# Patient Record
Sex: Male | Born: 1945 | Race: White | Hispanic: No | Marital: Married | Smoking: Former smoker
Health system: Southern US, Community
[De-identification: ages and names within clinical notes are randomized; demographics above are authoritative.]

## PROBLEM LIST (undated history)

## (undated) DIAGNOSIS — C7800 Secondary malignant neoplasm of unspecified lung: Secondary | ICD-10-CM

## (undated) DIAGNOSIS — I10 Essential (primary) hypertension: Secondary | ICD-10-CM

## (undated) DIAGNOSIS — F17200 Nicotine dependence, unspecified, uncomplicated: Secondary | ICD-10-CM

## (undated) DIAGNOSIS — G629 Polyneuropathy, unspecified: Secondary | ICD-10-CM

## (undated) DIAGNOSIS — C20 Malignant neoplasm of rectum: Secondary | ICD-10-CM

## (undated) DIAGNOSIS — I8290 Acute embolism and thrombosis of unspecified vein: Secondary | ICD-10-CM

## (undated) DIAGNOSIS — Z8619 Personal history of other infectious and parasitic diseases: Secondary | ICD-10-CM

## (undated) DIAGNOSIS — Z862 Personal history of diseases of the blood and blood-forming organs and certain disorders involving the immune mechanism: Secondary | ICD-10-CM

## (undated) HISTORY — DX: Essential (primary) hypertension: I10

## (undated) HISTORY — DX: Nicotine dependence, unspecified, uncomplicated: F17.200

## (undated) HISTORY — PX: LUNG BIOPSY: SHX232

## (undated) HISTORY — PX: TONSILLECTOMY AND ADENOIDECTOMY: SUR1326

## (undated) HISTORY — PX: LUNG LOBECTOMY: SHX167

## (undated) HISTORY — DX: Personal history of diseases of the blood and blood-forming organs and certain disorders involving the immune mechanism: Z86.2

## (undated) HISTORY — DX: Personal history of other infectious and parasitic diseases: Z86.19

---

## 2008-11-14 HISTORY — PX: VARICOSE VEIN SURGERY: SHX832

## 2010-10-22 LAB — CBC
WBC: 10.4
platelet count: 213

## 2010-10-22 LAB — COMPREHENSIVE METABOLIC PANEL
ALT: 13 U/L (ref 10–40)
AST: 17 U/L
Albumin: 4
Alkaline Phosphatase: 64 U/L
BUN: 17 mg/dL (ref 4–21)
Glucose: 95
Potassium: 4.4 mmol/L
Total Bilirubin: 0.5 mg/dL

## 2010-10-22 LAB — LIPID PANEL: Cholesterol, Total: 216

## 2012-03-21 ENCOUNTER — Ambulatory Visit (INDEPENDENT_AMBULATORY_CARE_PROVIDER_SITE_OTHER): Payer: Managed Care, Other (non HMO) | Admitting: Family Medicine

## 2012-03-21 ENCOUNTER — Encounter: Payer: Self-pay | Admitting: Family Medicine

## 2012-03-21 VITALS — BP 128/82 | HR 66 | Temp 97.3°F | Ht 67.0 in | Wt 201.8 lb

## 2012-03-21 DIAGNOSIS — Z862 Personal history of diseases of the blood and blood-forming organs and certain disorders involving the immune mechanism: Secondary | ICD-10-CM

## 2012-03-21 DIAGNOSIS — F17200 Nicotine dependence, unspecified, uncomplicated: Secondary | ICD-10-CM

## 2012-03-21 DIAGNOSIS — Z23 Encounter for immunization: Secondary | ICD-10-CM

## 2012-03-21 DIAGNOSIS — I10 Essential (primary) hypertension: Secondary | ICD-10-CM

## 2012-03-21 NOTE — Patient Instructions (Addendum)
We will request records from Dr. Quillian Quince. Bring me a copy of blood work at next visit. Come in at your convenience for complete physical, come in sooner if needed. Good to meet you today, call us with questions. Pneumonia shot today.

## 2012-03-21 NOTE — Assessment & Plan Note (Signed)
Stable on benicar.  Some reaction to prior med, thinks HCTZ - sounds like tachycardia that led him to ER? Await records from prior PCP.  No changes today. Desires to stay on benicar.

## 2012-03-21 NOTE — Progress Notes (Signed)
Subjective:    Patient ID: Stephen [name], male    DOB: 1946/01/07, 66 y.o.   MRN: [id number]  HPI CC: new pt establish  Would like to establish closer to home.  Prior saw Dr. Quillian Quince in [facility]  Hand pain - longstanding for last year, worse recently.  Worse pain with grasping hand, ie holding cup.    Smoking - 3/4 ppd.  Contemplative.  HTN - compliant with benicar and tolerating well.  No HA, vision changes, CP/tightness, SOB, leg swelling.  Prior on another medicine that led him to hospitalization from tachycardia.  Thinks this was hydrochlorothiazide.  Preventative: 11/2011 CPE, blood work then as well. Tetanus shot - 35 yrs ago.  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  Past histories reviewed and updated if relevant as below. There is no problem list on file for this patient.  Past Medical History  Diagnosis Date  . History of anemia   . History of chicken pox   . HTN (hypertension)    Past Surgical History  Procedure Date  . Varicose vein surgery 2010  . Tonsillectomy and adenoidectomy Childhood   History  Substance Use Topics  . Smoking status: Current Everyday Smoker -- 0.8 packs/day    Types: Cigarettes  . Smokeless tobacco: Never Used  . Alcohol Use: Yes     Regular 2 scotch/day   Family History  Problem Relation Age of Onset  . Hypertension Mother   . Coronary artery disease Mother     angina  . Cancer Mother     breast  . Cancer Father 23    esophageal  . COPD Brother     emphysema   No Known Allergies Current Outpatient Prescriptions on File Prior to Visit  Medication Sig Dispense Refill  . loratadine (CLARITIN) 10 MG tablet Take 10 mg by mouth daily as needed.       Marland Kitchen olmesartan (BENICAR) 20 MG tablet Take 20 mg by mouth daily.         Review of Systems  Constitutional: Negative for fever, chills, activity change, appetite change, fatigue and unexpected weight change.  HENT: Negative for hearing loss and neck pain.   Eyes: Negative for visual disturbance.  Respiratory: Positive for cough and wheezing. Negative for chest tightness and shortness of breath.   Cardiovascular: Negative for chest pain, palpitations and leg swelling.  Gastrointestinal: Negative for nausea, vomiting, abdominal pain, diarrhea, constipation, blood in stool and abdominal distention.  Genitourinary: Negative for hematuria and difficulty urinating.  Musculoskeletal: Negative for myalgias and arthralgias.  Skin: Negative for rash.  Neurological: Negative for dizziness, seizures, syncope and headaches.  Hematological: Does not bruise/bleed easily.  Psychiatric/Behavioral: Negative for dysphoric mood. The patient is not nervous/anxious.        Objective:   Physical Exam  Nursing note and vitals reviewed. Constitutional: He is oriented to person, place, and time. He appears well-developed and well-nourished. No distress.  HENT:  Head: Normocephalic and atraumatic.  Right Ear: External ear normal.  Left Ear: External ear normal.  Nose: Nose normal.  Mouth/Throat: Oropharynx is clear and moist. No oropharyngeal exudate.  Eyes: Conjunctivae and EOM are normal. Pupils are equal, round, and reactive to light. No scleral icterus.  Neck: Normal range of motion. Neck supple.  Cardiovascular: Normal rate, regular rhythm, normal heart sounds and intact distal pulses.   No murmur heard. Pulses:      Radial pulses are 2+ on the right side, and 2+ on the left side.  Pulmonary/Chest: Effort normal and breath sounds normal. No respiratory distress. He has no wheezes. He has no rales.  Abdominal: Soft. Bowel sounds are normal. He exhibits no distension and no mass. There is no  tenderness. There is no rebound and no guarding.  Musculoskeletal: Normal range of motion. He exhibits no edema.  Lymphadenopathy:    He has no cervical adenopathy.  Neurological: He is alert and oriented to person, place, and time.       CN grossly intact, station and gait intact  Skin: Skin is warm and dry. No rash noted.  Psychiatric: He has a normal mood and affect. His behavior is normal. Judgment and thought content normal.      Assessment & Plan:

## 2012-03-21 NOTE — Assessment & Plan Note (Signed)
Encouraged cessation. May need eval for COPD in future if any dyspnea or wheezing. currently endorses some but attributes to seasonal allergies.

## 2012-04-09 ENCOUNTER — Encounter: Payer: Self-pay | Admitting: Family Medicine

## 2012-04-17 ENCOUNTER — Encounter: Payer: Self-pay | Admitting: Family Medicine

## 2013-09-14 DIAGNOSIS — C7800 Secondary malignant neoplasm of unspecified lung: Secondary | ICD-10-CM

## 2013-09-14 DIAGNOSIS — C20 Malignant neoplasm of rectum: Secondary | ICD-10-CM

## 2013-09-14 HISTORY — DX: Secondary malignant neoplasm of unspecified lung: C78.00

## 2013-09-14 HISTORY — DX: Malignant neoplasm of rectum: C20

## 2013-10-14 ENCOUNTER — Ambulatory Visit: Payer: Self-pay | Admitting: Oncology

## 2013-10-25 ENCOUNTER — Ambulatory Visit: Payer: Self-pay | Admitting: Oncology

## 2013-10-31 ENCOUNTER — Ambulatory Visit: Payer: Self-pay | Admitting: Oncology

## 2013-11-11 ENCOUNTER — Other Ambulatory Visit: Payer: Self-pay

## 2013-11-11 DIAGNOSIS — C20 Malignant neoplasm of rectum: Secondary | ICD-10-CM

## 2013-11-11 NOTE — Progress Notes (Signed)
EUS scheduled, pt instructed and medications reviewed.  Patient instructions mailed to home.  Patient to call with any questions or concerns.  

## 2013-11-14 ENCOUNTER — Ambulatory Visit: Payer: Self-pay | Admitting: Oncology

## 2013-11-14 DIAGNOSIS — I8290 Acute embolism and thrombosis of unspecified vein: Secondary | ICD-10-CM

## 2013-11-14 HISTORY — PX: COLON RESECTION: SHX5231

## 2013-11-14 HISTORY — DX: Acute embolism and thrombosis of unspecified vein: I82.90

## 2013-11-14 HISTORY — PX: OTHER SURGICAL HISTORY: SHX169

## 2013-11-14 HISTORY — PX: COLONOSCOPY: SHX174

## 2013-11-15 ENCOUNTER — Encounter (HOSPITAL_COMMUNITY): Payer: Self-pay | Admitting: *Deleted

## 2013-11-15 ENCOUNTER — Encounter (HOSPITAL_COMMUNITY)
Admission: RE | Disposition: A | Discharge status: Home / Self Care | Payer: Self-pay | Source: Ambulatory Visit | Attending: Gastroenterology

## 2013-11-15 ENCOUNTER — Ambulatory Visit (HOSPITAL_COMMUNITY)
Admission: RE | Admit: 2013-11-15 | Discharge: 2013-11-15 | Disposition: A | Discharge status: Home / Self Care | Payer: Medicare PPO | Source: Ambulatory Visit | Attending: Gastroenterology | Admitting: Gastroenterology

## 2013-11-15 DIAGNOSIS — Z800 Family history of malignant neoplasm of digestive organs: Secondary | ICD-10-CM | POA: Insufficient documentation

## 2013-11-15 DIAGNOSIS — I10 Essential (primary) hypertension: Secondary | ICD-10-CM | POA: Insufficient documentation

## 2013-11-15 DIAGNOSIS — C20 Malignant neoplasm of rectum: Secondary | ICD-10-CM

## 2013-11-15 DIAGNOSIS — F17200 Nicotine dependence, unspecified, uncomplicated: Secondary | ICD-10-CM | POA: Insufficient documentation

## 2013-11-15 HISTORY — PX: EUS: SHX5427

## 2013-11-15 SURGERY — ULTRASOUND, LOWER GI TRACT, ENDOSCOPIC
Anesthesia: Moderate Sedation

## 2013-11-15 MED ORDER — MIDAZOLAM HCL 10 MG/2ML IJ SOLN
INTRAMUSCULAR | Status: DC | PRN
  Administered 2013-11-15: 2 mg via INTRAVENOUS

## 2013-11-15 MED ORDER — FENTANYL CITRATE 0.05 MG/ML IJ SOLN
INTRAMUSCULAR | Status: AC
  Filled 2013-11-15: qty 2

## 2013-11-15 MED ORDER — FENTANYL CITRATE 0.05 MG/ML IJ SOLN
INTRAMUSCULAR | Status: DC | PRN
  Administered 2013-11-15: 25 ug via INTRAVENOUS

## 2013-11-15 MED ORDER — SODIUM CHLORIDE 0.9 % IV SOLN: INTRAVENOUS | Status: DC

## 2013-11-15 MED ORDER — MIDAZOLAM HCL 10 MG/2ML IJ SOLN
INTRAMUSCULAR | Status: AC
  Filled 2013-11-15: qty 2

## 2013-11-15 MED ORDER — DIPHENHYDRAMINE HCL 50 MG/ML IJ SOLN
INTRAMUSCULAR | Status: AC
  Filled 2013-11-15: qty 1

## 2013-11-15 NOTE — Op Note (Signed)
[facility] [place], [id number]   ENDOSCOPIC ULTRASOUND PROCEDURE REPORT  PATIENT: Stephen [name], Stephen [name]  MR#: [id number] BIRTHDATE: [birth year]  GENDER: Male ENDOSCOPIST: Milus Banister, MD REFERRED BY:  Dr. Grayland Ormond [facility]), Dr. 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  Claudie Leach in [place]. MEDICATIONS: Fentanyl 25 mcg IV and Versed 2 mg IV  DESCRIPTION OF PROCEDURE:   After the risks benefits and alternatives of the procedure were  explained, informed consent was obtained. The patient was then placed in the left, lateral, decubitus postion and IV sedation was administered. Throughout the procedure, the patients blood pressure, pulse and oxygen saturations were monitored continuously.  Under direct visualization, the Pentax Radial EUS [id number]  endoscope was introduced through the anus  and advanced to the sigmoid colon . Water was used as necessary to provide an acoustic interface.  Upon completion of the imaging, water was removed and the patient was sent to the recovery room in satisfactory condition.   Sigmoidoscopic findings: 1. Clearly malignant, non-circumferential mass in distal rectum. The mass measures about 4cm across, occupies 1/3 the circumference rectum, predominantly on posterior wall of the rectum and its distal edge is within 1cm of the anal verge.  EUS findings: 1. The mass above corresponds with a hypoechoic, hetergeneous lesion that passes into and through the muscularis propria layer of the distal rectal wall (uT3).  Measures 3.8cm across. 2. The left iliac lymphnode noted by PET/CT was visible on this exam as well and was 35mm across, discretely bordered, hypoechoic, homogeneous (suspicious for malignant involvement  (uN1).  Impression: uT3N1 non-circumferntial, 4cm, posterior wall,  rectal adenocarcinoma with distal edge within 1cm of the anal verge. He would likely benefit from neo-adjuvant chemo/XRT.   _______________________________ eSignedMilus Banister, MD 11/15/2013 12:22 PM   cc: Cathlean Cower, MD

## 2013-11-15 NOTE — Discharge Instructions (Signed)

## 2013-11-15 NOTE — H&P (Signed)
HPI: This is a very pleasant man with recently diagnosed rectal adenocarcinoma.  No clear metaastatic disease on PET/CT however suspicious L iliac node and CEA was 70.    Past Medical History  Diagnosis Date  . History of anemia   . History of chicken pox   . HTN (hypertension)   . Smoker   . Allergic rhinitis     Past Surgical History  Procedure Laterality Date  . Varicose vein surgery  2010  . Tonsillectomy and adenoidectomy  Childhood    Current Facility-Administered Medications  Medication Dose Route Frequency Provider Last Rate Last Dose  . 0.9 %  sodium chloride infusion   Intravenous Continuous Milus Banister, MD        Allergies as of 11/11/2013  . (No Known Allergies)    Family History  Problem Relation Age of Onset  . Hypertension Mother   . Coronary artery disease Mother     angina  . Cancer Mother     breast  . Cancer Father 109    esophageal  . COPD Brother     emphysema  . Cancer Paternal Grandmother     colon  . Diabetes Neg Hx     History   Social History  . Marital Status: Married    Spouse Name: N/A    Number of Children: N/A  . Years of Education: N/A   Occupational History  . Not on file.   Social History Main Topics  . Smoking status: Current Every Day Smoker -- 0.80 packs/day    Types: Cigarettes  . Smokeless tobacco: Never Used  . Alcohol Use: Yes     Comment: Regular 2 scotch/day  . Drug Use: No  . Sexual Activity: Not on file   Other Topics Concern  . 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 male with rectal cancer   For lower EUS today.

## 2013-11-18 ENCOUNTER — Encounter (HOSPITAL_COMMUNITY): Payer: Self-pay | Admitting: Gastroenterology

## 2013-11-25 ENCOUNTER — Ambulatory Visit: Payer: Self-pay | Admitting: Oncology

## 2013-11-27 ENCOUNTER — Ambulatory Visit: Payer: Self-pay | Admitting: Unknown Physician Specialty

## 2013-11-28 ENCOUNTER — Ambulatory Visit: Payer: Self-pay | Admitting: Surgery

## 2013-12-12 LAB — CBC CANCER CENTER
BASOS ABS: 0.1 x10 3/mm (ref 0.0–0.1)
Basophil %: 1.3 %
Eosinophil #: 0.3 x10 3/mm (ref 0.0–0.7)
Eosinophil %: 2.4 %
HCT: 44.5 % (ref 40.0–52.0)
HGB: 15.2 g/dL (ref 13.0–18.0)
LYMPHS ABS: 1.4 x10 3/mm (ref 1.0–3.6)
Lymphocyte %: 13.1 %
MCH: 37.9 pg — AB (ref 26.0–34.0)
MCHC: 34.2 g/dL (ref 32.0–36.0)
MCV: 111 fL — ABNORMAL HIGH (ref 80–100)
Monocyte #: 1 x10 3/mm (ref 0.2–1.0)
Monocyte %: 9.9 %
Neutrophil #: 7.7 x10 3/mm — ABNORMAL HIGH (ref 1.4–6.5)
Neutrophil %: 73.3 %
Platelet: 300 x10 3/mm (ref 150–440)
RBC: 4.01 10*6/uL — ABNORMAL LOW (ref 4.40–5.90)
RDW: 17.1 % — AB (ref 11.5–14.5)
WBC: 10.5 x10 3/mm (ref 3.8–10.6)

## 2013-12-12 LAB — COMPREHENSIVE METABOLIC PANEL
ALK PHOS: 91 U/L
Albumin: 3.5 g/dL (ref 3.4–5.0)
Anion Gap: 9 (ref 7–16)
BUN: 9 mg/dL (ref 7–18)
Bilirubin,Total: 0.7 mg/dL (ref 0.2–1.0)
CHLORIDE: 95 mmol/L — AB (ref 98–107)
Calcium, Total: 8.3 mg/dL — ABNORMAL LOW (ref 8.5–10.1)
Co2: 25 mmol/L (ref 21–32)
Creatinine: 1.18 mg/dL (ref 0.60–1.30)
EGFR (African American): >60
EGFR (Non-African Amer.): >60
Glucose: 95 mg/dL (ref 65–99)
Osmolality: 257 (ref 275–301)
POTASSIUM: 4.7 mmol/L (ref 3.5–5.1)
SGOT(AST): 20 U/L (ref 15–37)
SGPT (ALT): 21 U/L (ref 12–78)
Sodium: 129 mmol/L — ABNORMAL LOW (ref 136–145)
Total Protein: 7.4 g/dL (ref 6.4–8.2)

## 2013-12-13 LAB — CEA: CEA: 62.9 ng/mL — ABNORMAL HIGH (ref 0.0–4.7)

## 2013-12-15 ENCOUNTER — Ambulatory Visit: Payer: Self-pay | Admitting: Oncology

## 2013-12-20 LAB — CBC CANCER CENTER
Basophil #: 0 x10 3/mm (ref 0.0–0.1)
Basophil %: 0.9 %
EOS PCT: 4.8 %
Eosinophil #: 0.2 x10 3/mm (ref 0.0–0.7)
HCT: 42.2 % (ref 40.0–52.0)
HGB: 14.9 g/dL (ref 13.0–18.0)
Lymphocyte #: 0.9 x10 3/mm — ABNORMAL LOW (ref 1.0–3.6)
Lymphocyte %: 27.1 %
MCH: 40 pg — AB (ref 26.0–34.0)
MCHC: 35.2 g/dL (ref 32.0–36.0)
MCV: 114 fL — AB (ref 80–100)
Monocyte #: 0.3 x10 3/mm (ref 0.2–1.0)
Monocyte %: 10 %
NEUTROS ABS: 1.8 x10 3/mm (ref 1.4–6.5)
Neutrophil %: 57.2 %
PLATELETS: 170 x10 3/mm (ref 150–440)
RBC: 3.72 10*6/uL — ABNORMAL LOW (ref 4.40–5.90)
RDW: 17 % — AB (ref 11.5–14.5)
WBC: 3.2 x10 3/mm — ABNORMAL LOW (ref 3.8–10.6)

## 2013-12-26 LAB — CBC CANCER CENTER
BASOS ABS: 0 x10 3/mm (ref 0.0–0.1)
Basophil %: 0.5 %
EOS ABS: 0.4 x10 3/mm (ref 0.0–0.7)
Eosinophil %: 6.7 %
HCT: 42.2 % (ref 40.0–52.0)
HGB: 14.2 g/dL (ref 13.0–18.0)
LYMPHS ABS: 0.8 x10 3/mm — AB (ref 1.0–3.6)
Lymphocyte %: 14.1 %
MCH: 37.6 pg — ABNORMAL HIGH (ref 26.0–34.0)
MCHC: 33.6 g/dL (ref 32.0–36.0)
MCV: 112 fL — AB (ref 80–100)
MONO ABS: 0.9 x10 3/mm (ref 0.2–1.0)
MONOS PCT: 16.4 %
NEUTROS ABS: 3.5 x10 3/mm (ref 1.4–6.5)
Neutrophil %: 62.3 %
Platelet: 150 x10 3/mm (ref 150–440)
RBC: 3.77 10*6/uL — AB (ref 4.40–5.90)
RDW: 17.3 % — AB (ref 11.5–14.5)
WBC: 5.6 x10 3/mm (ref 3.8–10.6)

## 2013-12-26 LAB — BASIC METABOLIC PANEL
Anion Gap: 8 (ref 7–16)
BUN: 14 mg/dL (ref 7–18)
CHLORIDE: 101 mmol/L (ref 98–107)
Calcium, Total: 8 mg/dL — ABNORMAL LOW (ref 8.5–10.1)
Co2: 27 mmol/L (ref 21–32)
Creatinine: 1.29 mg/dL (ref 0.60–1.30)
GFR CALC NON AF AMER: 57 — AB
Glucose: 118 mg/dL — ABNORMAL HIGH (ref 65–99)
Osmolality: 274 (ref 275–301)
POTASSIUM: 4.2 mmol/L (ref 3.5–5.1)
Sodium: 136 mmol/L (ref 136–145)

## 2014-01-03 LAB — CBC CANCER CENTER
BASOS PCT: 1.2 %
Basophil #: 0.1 x10 3/mm (ref 0.0–0.1)
Eosinophil #: 0.3 x10 3/mm (ref 0.0–0.7)
Eosinophil %: 4 %
HCT: 42.6 % (ref 40.0–52.0)
HGB: 14.4 g/dL (ref 13.0–18.0)
LYMPHS ABS: 0.9 x10 3/mm — AB (ref 1.0–3.6)
Lymphocyte %: 11.6 %
MCH: 37.7 pg — ABNORMAL HIGH (ref 26.0–34.0)
MCHC: 33.7 g/dL (ref 32.0–36.0)
MCV: 112 fL — AB (ref 80–100)
MONOS PCT: 11.8 %
Monocyte #: 0.9 x10 3/mm (ref 0.2–1.0)
NEUTROS PCT: 71.4 %
Neutrophil #: 5.5 x10 3/mm (ref 1.4–6.5)
PLATELETS: 252 x10 3/mm (ref 150–440)
RBC: 3.81 10*6/uL — ABNORMAL LOW (ref 4.40–5.90)
RDW: 17.1 % — ABNORMAL HIGH (ref 11.5–14.5)
WBC: 7.6 x10 3/mm (ref 3.8–10.6)

## 2014-01-10 LAB — CBC CANCER CENTER
BASOS ABS: 0.1 x10 3/mm (ref 0.0–0.1)
Basophil %: 0.8 %
EOS PCT: 3 %
Eosinophil #: 0.2 x10 3/mm (ref 0.0–0.7)
HCT: 42.7 % (ref 40.0–52.0)
HGB: 14.3 g/dL (ref 13.0–18.0)
LYMPHS ABS: 0.8 x10 3/mm — AB (ref 1.0–3.6)
LYMPHS PCT: 10.7 %
MCH: 37.4 pg — ABNORMAL HIGH (ref 26.0–34.0)
MCHC: 33.5 g/dL (ref 32.0–36.0)
MCV: 112 fL — ABNORMAL HIGH (ref 80–100)
MONOS PCT: 12.3 %
Monocyte #: 0.9 x10 3/mm (ref 0.2–1.0)
NEUTROS ABS: 5.2 x10 3/mm (ref 1.4–6.5)
Neutrophil %: 73.2 %
PLATELETS: 185 x10 3/mm (ref 150–440)
RBC: 3.83 10*6/uL — ABNORMAL LOW (ref 4.40–5.90)
RDW: 16.8 % — AB (ref 11.5–14.5)
WBC: 7.1 x10 3/mm (ref 3.8–10.6)

## 2014-01-10 LAB — COMPREHENSIVE METABOLIC PANEL
ALBUMIN: 3.2 g/dL — AB (ref 3.4–5.0)
ALT: 21 U/L (ref 12–78)
Alkaline Phosphatase: 64 U/L
Anion Gap: 8 (ref 7–16)
BILIRUBIN TOTAL: 0.2 mg/dL (ref 0.2–1.0)
BUN: 10 mg/dL (ref 7–18)
CREATININE: 1.3 mg/dL (ref 0.60–1.30)
Calcium, Total: 8.3 mg/dL — ABNORMAL LOW (ref 8.5–10.1)
Chloride: 103 mmol/L (ref 98–107)
Co2: 28 mmol/L (ref 21–32)
GFR CALC NON AF AMER: 56 — AB
Glucose: 98 mg/dL (ref 65–99)
OSMOLALITY: 277 (ref 275–301)
POTASSIUM: 4.4 mmol/L (ref 3.5–5.1)
SGOT(AST): 16 U/L (ref 15–37)
Sodium: 139 mmol/L (ref 136–145)
Total Protein: 6.9 g/dL (ref 6.4–8.2)

## 2014-01-12 ENCOUNTER — Ambulatory Visit: Payer: Self-pay | Admitting: Oncology

## 2014-01-17 LAB — CBC CANCER CENTER
BASOS PCT: 0.9 %
Basophil #: 0 x10 3/mm (ref 0.0–0.1)
EOS PCT: 5.8 %
Eosinophil #: 0.3 x10 3/mm (ref 0.0–0.7)
HCT: 41.1 % (ref 40.0–52.0)
HGB: 13.9 g/dL (ref 13.0–18.0)
Lymphocyte #: 0.5 x10 3/mm — ABNORMAL LOW (ref 1.0–3.6)
Lymphocyte %: 9.6 %
MCH: 37.2 pg — ABNORMAL HIGH (ref 26.0–34.0)
MCHC: 33.8 g/dL (ref 32.0–36.0)
MCV: 110 fL — AB (ref 80–100)
Monocyte #: 0.3 x10 3/mm (ref 0.2–1.0)
Monocyte %: 5.9 %
Neutrophil #: 3.9 x10 3/mm (ref 1.4–6.5)
Neutrophil %: 77.8 %
Platelet: 161 x10 3/mm (ref 150–440)
RBC: 3.73 10*6/uL — AB (ref 4.40–5.90)
RDW: 16.7 % — ABNORMAL HIGH (ref 11.5–14.5)
WBC: 5 x10 3/mm (ref 3.8–10.6)

## 2014-01-18 LAB — CEA: CEA: 20.9 ng/mL — AB (ref 0.0–4.7)

## 2014-01-28 ENCOUNTER — Ambulatory Visit: Payer: Self-pay | Admitting: Surgery

## 2014-02-06 ENCOUNTER — Inpatient Hospital Stay: Payer: Self-pay | Admitting: Surgery

## 2014-02-07 LAB — BASIC METABOLIC PANEL
ANION GAP: 5 — AB (ref 7–16)
BUN: 11 mg/dL (ref 7–18)
Calcium, Total: 8.5 mg/dL (ref 8.5–10.1)
Chloride: 106 mmol/L (ref 98–107)
Co2: 26 mmol/L (ref 21–32)
Creatinine: 1.21 mg/dL (ref 0.60–1.30)
EGFR (African American): >60
Glucose: 146 mg/dL — ABNORMAL HIGH (ref 65–99)
OSMOLALITY: 276 (ref 275–301)
POTASSIUM: 4.3 mmol/L (ref 3.5–5.1)
Sodium: 137 mmol/L (ref 136–145)

## 2014-02-07 LAB — CBC WITH DIFFERENTIAL/PLATELET
Basophil #: 0 10*3/uL (ref 0.0–0.1)
Basophil %: 0.2 %
Eosinophil #: 0 10*3/uL (ref 0.0–0.7)
Eosinophil %: 0 %
HCT: 36.7 % — ABNORMAL LOW (ref 40.0–52.0)
HGB: 12.4 g/dL — ABNORMAL LOW (ref 13.0–18.0)
LYMPHS ABS: 0.6 10*3/uL — AB (ref 1.0–3.6)
Lymphocyte %: 4.4 %
MCH: 37.9 pg — ABNORMAL HIGH (ref 26.0–34.0)
MCHC: 33.8 g/dL (ref 32.0–36.0)
MCV: 112 fL — AB (ref 80–100)
MONO ABS: 1.4 x10 3/mm — AB (ref 0.2–1.0)
MONOS PCT: 10.7 %
NEUTROS ABS: 11.1 10*3/uL — AB (ref 1.4–6.5)
Neutrophil %: 84.7 %
PLATELETS: 189 10*3/uL (ref 150–440)
RBC: 3.27 10*6/uL — ABNORMAL LOW (ref 4.40–5.90)
RDW: 15.9 % — ABNORMAL HIGH (ref 11.5–14.5)
WBC: 13.1 10*3/uL — AB (ref 3.8–10.6)

## 2014-02-11 LAB — PLATELET COUNT: Platelet: 222 10*3/uL (ref 150–440)

## 2014-02-12 ENCOUNTER — Ambulatory Visit: Payer: Self-pay | Admitting: Oncology

## 2014-02-21 LAB — CBC CANCER CENTER
Basophil #: 0.1 10*3/uL
Basophil %: 1 %
Eosinophil #: 0.6 10*3/uL
Eosinophil %: 7.6 %
HCT: 38.4 % — ABNORMAL LOW
HGB: 12.5 g/dL — ABNORMAL LOW
Lymphocyte %: 9.9 %
Lymphs Abs: 0.8 10*3/uL — ABNORMAL LOW
MCH: 35.9 pg — ABNORMAL HIGH
MCHC: 32.6 g/dL
MCV: 110 fL — ABNORMAL HIGH
Monocyte #: 0.9 10*3/uL
Monocyte %: 10.7 %
Neutrophil #: 6 10*3/uL
Neutrophil %: 70.8 %
Platelet: 385 10*3/uL
RBC: 3.49 10*6/uL — ABNORMAL LOW
RDW: 15.5 % — ABNORMAL HIGH
WBC: 8.5 10*3/uL

## 2014-02-21 LAB — COMPREHENSIVE METABOLIC PANEL WITH GFR
Albumin: 3 g/dL — ABNORMAL LOW
Alkaline Phosphatase: 72 U/L
Anion Gap: 9
BUN: 12 mg/dL
Bilirubin,Total: 0.3 mg/dL
Calcium, Total: 9.3 mg/dL
Chloride: 101 mmol/L
Co2: 27 mmol/L
Creatinine: 1.24 mg/dL
EGFR (African American): >60
EGFR (Non-African Amer.): 60 — ABNORMAL LOW
Glucose: 99 mg/dL
Osmolality: 274
Potassium: 4 mmol/L
SGOT(AST): 13 U/L — ABNORMAL LOW
SGPT (ALT): 16 U/L
Sodium: 137 mmol/L
Total Protein: 7.2 g/dL

## 2014-03-14 ENCOUNTER — Ambulatory Visit: Payer: Self-pay | Admitting: Oncology

## 2014-03-17 LAB — CBC CANCER CENTER
BASOS ABS: 0.1 x10 3/mm (ref 0.0–0.1)
BASOS PCT: 1.2 %
EOS ABS: 0.5 x10 3/mm (ref 0.0–0.7)
EOS PCT: 6 %
HCT: 39.2 % — ABNORMAL LOW (ref 40.0–52.0)
HGB: 13.3 g/dL (ref 13.0–18.0)
LYMPHS ABS: 0.9 x10 3/mm — AB (ref 1.0–3.6)
LYMPHS PCT: 12.1 %
MCH: 35.7 pg — ABNORMAL HIGH (ref 26.0–34.0)
MCHC: 34.1 g/dL (ref 32.0–36.0)
MCV: 105 fL — ABNORMAL HIGH (ref 80–100)
Monocyte #: 0.9 x10 3/mm (ref 0.2–1.0)
Monocyte %: 11.6 %
NEUTROS ABS: 5.4 x10 3/mm (ref 1.4–6.5)
Neutrophil %: 69.1 %
Platelet: 251 x10 3/mm (ref 150–440)
RBC: 3.74 10*6/uL — ABNORMAL LOW (ref 4.40–5.90)
RDW: 14.8 % — AB (ref 11.5–14.5)
WBC: 7.7 x10 3/mm (ref 3.8–10.6)

## 2014-03-17 LAB — COMPREHENSIVE METABOLIC PANEL
ANION GAP: 7 (ref 7–16)
Albumin: 3.2 g/dL — ABNORMAL LOW (ref 3.4–5.0)
Alkaline Phosphatase: 83 U/L
BILIRUBIN TOTAL: 0.4 mg/dL (ref 0.2–1.0)
BUN: 8 mg/dL (ref 7–18)
CALCIUM: 8.8 mg/dL (ref 8.5–10.1)
CREATININE: 1.08 mg/dL (ref 0.60–1.30)
Chloride: 103 mmol/L (ref 98–107)
Co2: 29 mmol/L (ref 21–32)
EGFR (African American): >60
Glucose: 97 mg/dL (ref 65–99)
Osmolality: 276 (ref 275–301)
POTASSIUM: 4.2 mmol/L (ref 3.5–5.1)
SGOT(AST): 13 U/L — ABNORMAL LOW (ref 15–37)
SGPT (ALT): 13 U/L (ref 12–78)
SODIUM: 139 mmol/L (ref 136–145)
Total Protein: 7.1 g/dL (ref 6.4–8.2)

## 2014-03-18 LAB — CEA: CEA: 3.9 ng/mL (ref 0.0–4.7)

## 2014-03-25 ENCOUNTER — Ambulatory Visit: Payer: Self-pay | Admitting: Cardiothoracic Surgery

## 2014-04-02 ENCOUNTER — Ambulatory Visit: Payer: Self-pay | Admitting: Internal Medicine

## 2014-04-03 DIAGNOSIS — Z01810 Encounter for preprocedural cardiovascular examination: Secondary | ICD-10-CM

## 2014-04-03 LAB — CBC CANCER CENTER
BASOS ABS: 0.1 x10 3/mm (ref 0.0–0.1)
Basophil %: 1.5 %
Eosinophil #: 0.2 x10 3/mm (ref 0.0–0.7)
Eosinophil %: 3.2 %
HCT: 40.6 % (ref 40.0–52.0)
HGB: 13.7 g/dL (ref 13.0–18.0)
LYMPHS ABS: 1 x10 3/mm (ref 1.0–3.6)
Lymphocyte %: 13.1 %
MCH: 35.4 pg — AB (ref 26.0–34.0)
MCHC: 33.8 g/dL (ref 32.0–36.0)
MCV: 105 fL — ABNORMAL HIGH (ref 80–100)
MONOS PCT: 8.8 %
Monocyte #: 0.7 x10 3/mm (ref 0.2–1.0)
NEUTROS ABS: 5.6 x10 3/mm (ref 1.4–6.5)
Neutrophil %: 73.4 %
Platelet: 251 x10 3/mm (ref 150–440)
RBC: 3.87 10*6/uL — ABNORMAL LOW (ref 4.40–5.90)
RDW: 15.8 % — AB (ref 11.5–14.5)
WBC: 7.6 x10 3/mm (ref 3.8–10.6)

## 2014-04-03 LAB — URINALYSIS, COMPLETE
BACTERIA: NONE SEEN
BLOOD: NEGATIVE
Bilirubin,UR: NEGATIVE
Glucose,UR: NEGATIVE mg/dL (ref 0–75)
Ketone: NEGATIVE
Leukocyte Esterase: NEGATIVE
Nitrite: NEGATIVE
PH: 6 (ref 4.5–8.0)
Protein: NEGATIVE
RBC,UR: 2 /HPF (ref 0–5)
SPECIFIC GRAVITY: 1.01 (ref 1.003–1.030)
Squamous Epithelial: NONE SEEN

## 2014-04-03 LAB — COMPREHENSIVE METABOLIC PANEL
ALK PHOS: 82 U/L
AST: 13 U/L — AB (ref 15–37)
Albumin: 3.5 g/dL (ref 3.4–5.0)
Anion Gap: 7 (ref 7–16)
BILIRUBIN TOTAL: 0.3 mg/dL (ref 0.2–1.0)
BUN: 14 mg/dL (ref 7–18)
CREATININE: 1.06 mg/dL (ref 0.60–1.30)
Calcium, Total: 8.6 mg/dL (ref 8.5–10.1)
Chloride: 104 mmol/L (ref 98–107)
Co2: 30 mmol/L (ref 21–32)
EGFR (African American): >60
EGFR (Non-African Amer.): >60
Glucose: 86 mg/dL (ref 65–99)
Osmolality: 281 (ref 275–301)
Potassium: 4 mmol/L (ref 3.5–5.1)
SGPT (ALT): 14 U/L (ref 12–78)
Sodium: 141 mmol/L (ref 136–145)
Total Protein: 7.6 g/dL (ref 6.4–8.2)

## 2014-04-03 LAB — APTT: ACTIVATED PTT: 29.3 s (ref 23.6–35.9)

## 2014-04-03 LAB — PROTIME-INR
INR: 1
PROTHROMBIN TIME: 12.8 s (ref 11.5–14.7)

## 2014-04-04 LAB — PATHOLOGY REPORT

## 2014-04-05 LAB — URINE CULTURE

## 2014-04-11 ENCOUNTER — Ambulatory Visit: Payer: Self-pay

## 2014-04-14 ENCOUNTER — Ambulatory Visit: Payer: Self-pay | Admitting: Oncology

## 2014-04-14 ENCOUNTER — Inpatient Hospital Stay: Payer: Self-pay

## 2014-04-14 LAB — PLATELET COUNT: PLATELETS: 192 10*3/uL (ref 150–440)

## 2014-04-14 LAB — HEMOGLOBIN: HGB: 13.5 g/dL (ref 13.0–18.0)

## 2014-04-15 LAB — BASIC METABOLIC PANEL
ANION GAP: 4 — AB (ref 7–16)
BUN: 10 mg/dL (ref 7–18)
CALCIUM: 8.2 mg/dL — AB (ref 8.5–10.1)
CO2: 29 mmol/L (ref 21–32)
Chloride: 102 mmol/L (ref 98–107)
Creatinine: 1.24 mg/dL (ref 0.60–1.30)
EGFR (Non-African Amer.): 60 — ABNORMAL LOW
GLUCOSE: 109 mg/dL — AB (ref 65–99)
Osmolality: 270 (ref 275–301)
POTASSIUM: 4 mmol/L (ref 3.5–5.1)
SODIUM: 135 mmol/L — AB (ref 136–145)

## 2014-04-15 LAB — CBC WITH DIFFERENTIAL/PLATELET
BASOS PCT: 0.4 %
Basophil #: 0 10*3/uL (ref 0.0–0.1)
Eosinophil #: 0.2 10*3/uL (ref 0.0–0.7)
Eosinophil %: 2.7 %
HCT: 39.8 % — ABNORMAL LOW (ref 40.0–52.0)
HGB: 13.5 g/dL (ref 13.0–18.0)
LYMPHS ABS: 0.5 10*3/uL — AB (ref 1.0–3.6)
Lymphocyte %: 5.8 %
MCH: 35.8 pg — ABNORMAL HIGH (ref 26.0–34.0)
MCHC: 33.8 g/dL (ref 32.0–36.0)
MCV: 106 fL — ABNORMAL HIGH (ref 80–100)
Monocyte #: 0.8 x10 3/mm (ref 0.2–1.0)
Monocyte %: 9.2 %
NEUTROS ABS: 7.1 10*3/uL — AB (ref 1.4–6.5)
Neutrophil %: 81.9 %
PLATELETS: 190 10*3/uL (ref 150–440)
RBC: 3.76 10*6/uL — ABNORMAL LOW (ref 4.40–5.90)
RDW: 15.8 % — AB (ref 11.5–14.5)
WBC: 8.7 10*3/uL (ref 3.8–10.6)

## 2014-04-17 LAB — CBC WITH DIFFERENTIAL/PLATELET
BASOS PCT: 0.4 %
Basophil #: 0 10*3/uL (ref 0.0–0.1)
Eosinophil #: 0 10*3/uL (ref 0.0–0.7)
Eosinophil %: 0.2 %
HCT: 43.3 % (ref 40.0–52.0)
HGB: 14.6 g/dL (ref 13.0–18.0)
LYMPHS ABS: 0.2 10*3/uL — AB (ref 1.0–3.6)
Lymphocyte %: 3 %
MCH: 35.4 pg — ABNORMAL HIGH (ref 26.0–34.0)
MCHC: 33.7 g/dL (ref 32.0–36.0)
MCV: 105 fL — ABNORMAL HIGH (ref 80–100)
MONO ABS: 0.9 x10 3/mm (ref 0.2–1.0)
Monocyte %: 10.4 %
NEUTROS ABS: 7.1 10*3/uL — AB (ref 1.4–6.5)
Neutrophil %: 86 %
Platelet: 213 10*3/uL (ref 150–440)
RBC: 4.12 10*6/uL — ABNORMAL LOW (ref 4.40–5.90)
RDW: 16.3 % — AB (ref 11.5–14.5)
WBC: 8.2 10*3/uL (ref 3.8–10.6)

## 2014-04-17 LAB — BASIC METABOLIC PANEL
ANION GAP: 7 (ref 7–16)
BUN: 16 mg/dL (ref 7–18)
CALCIUM: 8.9 mg/dL (ref 8.5–10.1)
CREATININE: 1.01 mg/dL (ref 0.60–1.30)
Chloride: 100 mmol/L (ref 98–107)
Co2: 28 mmol/L (ref 21–32)
EGFR (Non-African Amer.): >60
GLUCOSE: 114 mg/dL — AB (ref 65–99)
OSMOLALITY: 272 (ref 275–301)
Potassium: 4.3 mmol/L (ref 3.5–5.1)
Sodium: 135 mmol/L — ABNORMAL LOW (ref 136–145)

## 2014-04-18 LAB — PATHOLOGY REPORT

## 2014-04-27 ENCOUNTER — Inpatient Hospital Stay: Payer: Self-pay | Admitting: Surgery

## 2014-04-27 LAB — COMPREHENSIVE METABOLIC PANEL
ALBUMIN: 3.5 g/dL (ref 3.4–5.0)
ALT: 22 U/L (ref 12–78)
ANION GAP: 13 (ref 7–16)
AST: 12 U/L — AB (ref 15–37)
Alkaline Phosphatase: 78 U/L
BUN: 29 mg/dL — ABNORMAL HIGH (ref 7–18)
Bilirubin,Total: 0.5 mg/dL (ref 0.2–1.0)
Calcium, Total: 9.2 mg/dL (ref 8.5–10.1)
Chloride: 89 mmol/L — ABNORMAL LOW (ref 98–107)
Co2: 26 mmol/L (ref 21–32)
Creatinine: 1.84 mg/dL — ABNORMAL HIGH (ref 0.60–1.30)
EGFR (African American): 43 — ABNORMAL LOW
EGFR (Non-African Amer.): 37 — ABNORMAL LOW
Glucose: 142 mg/dL — ABNORMAL HIGH (ref 65–99)
Osmolality: 265 (ref 275–301)
Potassium: 2.4 mmol/L — CL (ref 3.5–5.1)
Sodium: 128 mmol/L — ABNORMAL LOW (ref 136–145)
Total Protein: 8.4 g/dL — ABNORMAL HIGH (ref 6.4–8.2)

## 2014-04-27 LAB — CBC
HCT: 47 % (ref 40.0–52.0)
HGB: 15.3 g/dL (ref 13.0–18.0)
MCH: 32.7 pg (ref 26.0–34.0)
MCHC: 32.5 g/dL (ref 32.0–36.0)
MCV: 101 fL — ABNORMAL HIGH (ref 80–100)
Platelet: 428 10*3/uL (ref 150–440)
RBC: 4.68 10*6/uL (ref 4.40–5.90)
RDW: 16.3 % — AB (ref 11.5–14.5)
WBC: 18.1 10*3/uL — ABNORMAL HIGH (ref 3.8–10.6)

## 2014-04-27 LAB — TROPONIN I: Troponin-I: <0.02 ng/mL

## 2014-04-27 LAB — LIPASE, BLOOD: LIPASE: 79 U/L (ref 73–393)

## 2014-04-28 LAB — BASIC METABOLIC PANEL
ANION GAP: 7 (ref 7–16)
BUN: 32 mg/dL — ABNORMAL HIGH (ref 7–18)
CHLORIDE: 92 mmol/L — AB (ref 98–107)
Calcium, Total: 8.5 mg/dL (ref 8.5–10.1)
Co2: 30 mmol/L (ref 21–32)
Creatinine: 2.05 mg/dL — ABNORMAL HIGH (ref 0.60–1.30)
EGFR (African American): 38 — ABNORMAL LOW
GFR CALC NON AF AMER: 33 — AB
Glucose: 128 mg/dL — ABNORMAL HIGH (ref 65–99)
OSMOLALITY: 267 (ref 275–301)
Potassium: 2.6 mmol/L — ABNORMAL LOW (ref 3.5–5.1)
Sodium: 129 mmol/L — ABNORMAL LOW (ref 136–145)

## 2014-04-28 LAB — URINALYSIS, COMPLETE
Bacteria: NONE SEEN
Bilirubin,UR: NEGATIVE
GLUCOSE, UR: NEGATIVE mg/dL (ref 0–75)
Hyaline Cast: 12
Ketone: NEGATIVE
Leukocyte Esterase: NEGATIVE
Nitrite: NEGATIVE
PH: 5 (ref 4.5–8.0)
Protein: 30
RBC,UR: 15 /HPF (ref 0–5)
Specific Gravity: 1.023 (ref 1.003–1.030)
Squamous Epithelial: 1
WBC UR: 2 /HPF (ref 0–5)

## 2014-04-28 LAB — CLOSTRIDIUM DIFFICILE(ARMC)

## 2014-04-29 LAB — BASIC METABOLIC PANEL
ANION GAP: 6 — AB (ref 7–16)
BUN: 28 mg/dL — AB (ref 7–18)
CO2: 26 mmol/L (ref 21–32)
Calcium, Total: 8.9 mg/dL (ref 8.5–10.1)
Chloride: 101 mmol/L (ref 98–107)
Creatinine: 1.74 mg/dL — ABNORMAL HIGH (ref 0.60–1.30)
EGFR (African American): 46 — ABNORMAL LOW
GFR CALC NON AF AMER: 40 — AB
GLUCOSE: 154 mg/dL — AB (ref 65–99)
Osmolality: 275 (ref 275–301)
POTASSIUM: 2.4 mmol/L — AB (ref 3.5–5.1)
SODIUM: 133 mmol/L — AB (ref 136–145)

## 2014-04-30 LAB — BASIC METABOLIC PANEL
Anion Gap: 7 (ref 7–16)
BUN: 26 mg/dL — AB (ref 7–18)
CO2: 23 mmol/L (ref 21–32)
CREATININE: 1.79 mg/dL — AB (ref 0.60–1.30)
Calcium, Total: 8.4 mg/dL — ABNORMAL LOW (ref 8.5–10.1)
Chloride: 108 mmol/L — ABNORMAL HIGH (ref 98–107)
GFR CALC AF AMER: 44 — AB
GFR CALC NON AF AMER: 38 — AB
GLUCOSE: 139 mg/dL — AB (ref 65–99)
Osmolality: 283 (ref 275–301)
Potassium: 3.4 mmol/L — ABNORMAL LOW (ref 3.5–5.1)
Sodium: 138 mmol/L (ref 136–145)

## 2014-04-30 LAB — STOOL CULTURE

## 2014-05-01 LAB — BASIC METABOLIC PANEL
Anion Gap: 8 (ref 7–16)
BUN: 21 mg/dL — AB (ref 7–18)
CALCIUM: 8.4 mg/dL — AB (ref 8.5–10.1)
CREATININE: 1.49 mg/dL — AB (ref 0.60–1.30)
Chloride: 114 mmol/L — ABNORMAL HIGH (ref 98–107)
Co2: 20 mmol/L — ABNORMAL LOW (ref 21–32)
EGFR (African American): 55 — ABNORMAL LOW
GFR CALC NON AF AMER: 48 — AB
GLUCOSE: 121 mg/dL — AB (ref 65–99)
Osmolality: 287 (ref 275–301)
Potassium: 3.8 mmol/L (ref 3.5–5.1)
Sodium: 142 mmol/L (ref 136–145)

## 2014-05-01 LAB — CBC WITH DIFFERENTIAL/PLATELET
BASOS PCT: 0.3 %
Basophil #: 0 10*3/uL (ref 0.0–0.1)
EOS ABS: 0.2 10*3/uL (ref 0.0–0.7)
Eosinophil %: 1.8 %
HCT: 40.3 % (ref 40.0–52.0)
HGB: 13.3 g/dL (ref 13.0–18.0)
LYMPHS ABS: 0.7 10*3/uL — AB (ref 1.0–3.6)
Lymphocyte %: 5.2 %
MCH: 33.7 pg (ref 26.0–34.0)
MCHC: 33 g/dL (ref 32.0–36.0)
MCV: 102 fL — AB (ref 80–100)
Monocyte #: 1.6 x10 3/mm — ABNORMAL HIGH (ref 0.2–1.0)
Monocyte %: 11.7 %
NEUTROS ABS: 11.1 10*3/uL — AB (ref 1.4–6.5)
NEUTROS PCT: 81 %
Platelet: 331 10*3/uL (ref 150–440)
RBC: 3.95 10*6/uL — ABNORMAL LOW (ref 4.40–5.90)
RDW: 16.5 % — AB (ref 11.5–14.5)
WBC: 13.6 10*3/uL — ABNORMAL HIGH (ref 3.8–10.6)

## 2014-05-01 LAB — ALBUMIN: ALBUMIN: 2.5 g/dL — AB (ref 3.4–5.0)

## 2014-05-01 LAB — PROTIME-INR
INR: 1.7
Prothrombin Time: 19.5 secs — ABNORMAL HIGH (ref 11.5–14.7)

## 2014-05-02 LAB — CULTURE, BLOOD (SINGLE)

## 2014-05-02 LAB — PROTIME-INR
INR: 1.5
INR: 1.4
Prothrombin Time: 17.9 secs — ABNORMAL HIGH (ref 11.5–14.7)
Prothrombin Time: 17.2 secs — ABNORMAL HIGH (ref 11.5–14.7)

## 2014-05-03 LAB — BASIC METABOLIC PANEL
Anion Gap: 5 — ABNORMAL LOW (ref 7–16)
BUN: 16 mg/dL (ref 7–18)
CHLORIDE: 116 mmol/L — AB (ref 98–107)
Calcium, Total: 8.5 mg/dL (ref 8.5–10.1)
Co2: 22 mmol/L (ref 21–32)
Creatinine: 1.34 mg/dL — ABNORMAL HIGH (ref 0.60–1.30)
EGFR (Non-African Amer.): 54 — ABNORMAL LOW
Glucose: 110 mg/dL — ABNORMAL HIGH (ref 65–99)
Osmolality: 287 (ref 275–301)
POTASSIUM: 3.8 mmol/L (ref 3.5–5.1)
Sodium: 143 mmol/L (ref 136–145)

## 2014-05-04 LAB — BASIC METABOLIC PANEL
ANION GAP: 8 (ref 7–16)
BUN: 12 mg/dL (ref 7–18)
CALCIUM: 8.1 mg/dL — AB (ref 8.5–10.1)
CO2: 25 mmol/L (ref 21–32)
Chloride: 109 mmol/L — ABNORMAL HIGH (ref 98–107)
Creatinine: 1.14 mg/dL (ref 0.60–1.30)
EGFR (Non-African Amer.): >60
GLUCOSE: 100 mg/dL — AB (ref 65–99)
OSMOLALITY: 283 (ref 275–301)
Potassium: 3.3 mmol/L — ABNORMAL LOW (ref 3.5–5.1)
Sodium: 142 mmol/L (ref 136–145)

## 2014-05-05 LAB — PLATELET COUNT: PLATELETS: 194 10*3/uL (ref 150–440)

## 2014-05-05 LAB — POTASSIUM: Potassium: 3.3 mmol/L — ABNORMAL LOW (ref 3.5–5.1)

## 2014-05-07 LAB — CBC WITH DIFFERENTIAL/PLATELET
BASOS ABS: 0 10*3/uL (ref 0.0–0.1)
BASOS PCT: 0.5 %
EOS ABS: 0.3 10*3/uL (ref 0.0–0.7)
Eosinophil %: 3.2 %
HCT: 38 % — AB (ref 40.0–52.0)
HGB: 12.7 g/dL — AB (ref 13.0–18.0)
Lymphocyte #: 0.8 10*3/uL — ABNORMAL LOW (ref 1.0–3.6)
Lymphocyte %: 7.9 %
MCH: 33.5 pg (ref 26.0–34.0)
MCHC: 33.4 g/dL (ref 32.0–36.0)
MCV: 100 fL (ref 80–100)
MONO ABS: 1 x10 3/mm (ref 0.2–1.0)
Monocyte %: 10.8 %
Neutrophil #: 7.4 10*3/uL — ABNORMAL HIGH (ref 1.4–6.5)
Neutrophil %: 77.6 %
Platelet: 183 10*3/uL (ref 150–440)
RBC: 3.79 10*6/uL — AB (ref 4.40–5.90)
RDW: 16.6 % — ABNORMAL HIGH (ref 11.5–14.5)
WBC: 9.6 10*3/uL (ref 3.8–10.6)

## 2014-05-07 LAB — BASIC METABOLIC PANEL
ANION GAP: 6 — AB (ref 7–16)
BUN: 4 mg/dL — AB (ref 7–18)
CALCIUM: 8.3 mg/dL — AB (ref 8.5–10.1)
CO2: 26 mmol/L (ref 21–32)
Chloride: 106 mmol/L (ref 98–107)
Creatinine: 0.92 mg/dL (ref 0.60–1.30)
EGFR (African American): >60
EGFR (Non-African Amer.): >60
Glucose: 108 mg/dL — ABNORMAL HIGH (ref 65–99)
Osmolality: 273 (ref 275–301)
Potassium: 3.5 mmol/L (ref 3.5–5.1)
Sodium: 138 mmol/L (ref 136–145)

## 2014-05-08 LAB — POTASSIUM: Potassium: 3.8 mmol/L (ref 3.5–5.1)

## 2014-05-15 ENCOUNTER — Ambulatory Visit: Payer: Self-pay | Admitting: Cardiothoracic Surgery

## 2014-05-15 ENCOUNTER — Ambulatory Visit: Payer: Self-pay | Admitting: Oncology

## 2014-05-29 LAB — COMPREHENSIVE METABOLIC PANEL
ALBUMIN: 2.8 g/dL — AB (ref 3.4–5.0)
AST: 12 U/L — AB (ref 15–37)
Alkaline Phosphatase: 72 U/L
Anion Gap: 4 — ABNORMAL LOW (ref 7–16)
BILIRUBIN TOTAL: 0.3 mg/dL (ref 0.2–1.0)
BUN: 15 mg/dL (ref 7–18)
CHLORIDE: 103 mmol/L (ref 98–107)
Calcium, Total: 8.9 mg/dL (ref 8.5–10.1)
Co2: 29 mmol/L (ref 21–32)
Creatinine: 1.39 mg/dL — ABNORMAL HIGH (ref 0.60–1.30)
EGFR (African American): >60
EGFR (Non-African Amer.): 52 — ABNORMAL LOW
GLUCOSE: 96 mg/dL (ref 65–99)
Osmolality: 273 (ref 275–301)
Potassium: 4.1 mmol/L (ref 3.5–5.1)
SGPT (ALT): 16 U/L (ref 12–78)
SODIUM: 136 mmol/L (ref 136–145)
Total Protein: 7.3 g/dL (ref 6.4–8.2)

## 2014-05-29 LAB — CBC CANCER CENTER
BASOS ABS: 0.1 x10 3/mm (ref 0.0–0.1)
BASOS PCT: 1.5 %
Eosinophil #: 0.3 x10 3/mm (ref 0.0–0.7)
Eosinophil %: 4.9 %
HCT: 37.5 % — ABNORMAL LOW (ref 40.0–52.0)
HGB: 12.6 g/dL — ABNORMAL LOW (ref 13.0–18.0)
LYMPHS PCT: 19.2 %
Lymphocyte #: 1.1 x10 3/mm (ref 1.0–3.6)
MCH: 32.7 pg (ref 26.0–34.0)
MCHC: 33.6 g/dL (ref 32.0–36.0)
MCV: 97 fL (ref 80–100)
Monocyte #: 0.8 x10 3/mm (ref 0.2–1.0)
Monocyte %: 14 %
Neutrophil #: 3.5 x10 3/mm (ref 1.4–6.5)
Neutrophil %: 60.4 %
Platelet: 306 x10 3/mm (ref 150–440)
RBC: 3.85 10*6/uL — AB (ref 4.40–5.90)
RDW: 17 % — ABNORMAL HIGH (ref 11.5–14.5)
WBC: 5.8 x10 3/mm (ref 3.8–10.6)

## 2014-05-30 LAB — CEA: CEA: 2.3 ng/mL (ref 0.0–4.7)

## 2014-06-03 LAB — PATHOLOGY REPORT

## 2014-06-04 LAB — CBC CANCER CENTER
Basophil #: 0.1 x10 3/mm (ref 0.0–0.1)
Basophil %: 1.2 %
Eosinophil #: 0.4 x10 3/mm (ref 0.0–0.7)
Eosinophil %: 6.2 %
HCT: 39.5 % — AB (ref 40.0–52.0)
HGB: 12.9 g/dL — ABNORMAL LOW (ref 13.0–18.0)
LYMPHS PCT: 13.9 %
Lymphocyte #: 0.8 x10 3/mm — ABNORMAL LOW (ref 1.0–3.6)
MCH: 32.2 pg (ref 26.0–34.0)
MCHC: 32.6 g/dL (ref 32.0–36.0)
MCV: 99 fL (ref 80–100)
MONO ABS: 0.6 x10 3/mm (ref 0.2–1.0)
Monocyte %: 10.6 %
NEUTROS ABS: 4.1 x10 3/mm (ref 1.4–6.5)
Neutrophil %: 68.1 %
PLATELETS: 255 x10 3/mm (ref 150–440)
RBC: 4.01 10*6/uL — AB (ref 4.40–5.90)
RDW: 17 % — AB (ref 11.5–14.5)
WBC: 6.1 x10 3/mm (ref 3.8–10.6)

## 2014-06-04 LAB — COMPREHENSIVE METABOLIC PANEL
ANION GAP: 7 (ref 7–16)
Albumin: 2.9 g/dL — ABNORMAL LOW (ref 3.4–5.0)
Alkaline Phosphatase: 73 U/L
BUN: 14 mg/dL (ref 7–18)
Bilirubin,Total: 0.2 mg/dL (ref 0.2–1.0)
CHLORIDE: 103 mmol/L (ref 98–107)
Calcium, Total: 9.1 mg/dL (ref 8.5–10.1)
Co2: 28 mmol/L (ref 21–32)
Creatinine: 1.18 mg/dL (ref 0.60–1.30)
EGFR (African American): >60
EGFR (Non-African Amer.): >60
GLUCOSE: 118 mg/dL — AB (ref 65–99)
Osmolality: 277 (ref 275–301)
Potassium: 4.1 mmol/L (ref 3.5–5.1)
SGOT(AST): 15 U/L (ref 15–37)
SGPT (ALT): 16 U/L
Sodium: 138 mmol/L (ref 136–145)
TOTAL PROTEIN: 7.1 g/dL (ref 6.4–8.2)

## 2014-06-14 ENCOUNTER — Ambulatory Visit: Payer: Self-pay | Admitting: Oncology

## 2014-06-17 LAB — COMPREHENSIVE METABOLIC PANEL
Albumin: 3 g/dL — ABNORMAL LOW (ref 3.4–5.0)
Alkaline Phosphatase: 59 U/L
Anion Gap: 8 (ref 7–16)
BUN: 14 mg/dL (ref 7–18)
Bilirubin,Total: 0.4 mg/dL (ref 0.2–1.0)
CALCIUM: 8.7 mg/dL (ref 8.5–10.1)
CO2: 26 mmol/L (ref 21–32)
CREATININE: 1.2 mg/dL (ref 0.60–1.30)
Chloride: 105 mmol/L (ref 98–107)
EGFR (African American): >60
GLUCOSE: 94 mg/dL (ref 65–99)
OSMOLALITY: 278 (ref 275–301)
Potassium: 4 mmol/L (ref 3.5–5.1)
SGOT(AST): 20 U/L (ref 15–37)
SGPT (ALT): 21 U/L
SODIUM: 139 mmol/L (ref 136–145)
TOTAL PROTEIN: 6.8 g/dL (ref 6.4–8.2)

## 2014-06-17 LAB — CBC CANCER CENTER
Basophil #: 0.1 x10 3/mm (ref 0.0–0.1)
Basophil %: 2.5 %
EOS PCT: 6.5 %
Eosinophil #: 0.3 x10 3/mm (ref 0.0–0.7)
HCT: 36.7 % — ABNORMAL LOW (ref 40.0–52.0)
HGB: 12.2 g/dL — ABNORMAL LOW (ref 13.0–18.0)
Lymphocyte #: 0.9 x10 3/mm — ABNORMAL LOW (ref 1.0–3.6)
Lymphocyte %: 23.1 %
MCH: 32.6 pg (ref 26.0–34.0)
MCHC: 33.3 g/dL (ref 32.0–36.0)
MCV: 98 fL (ref 80–100)
MONO ABS: 0.8 x10 3/mm (ref 0.2–1.0)
MONOS PCT: 18.8 %
Neutrophil #: 2 x10 3/mm (ref 1.4–6.5)
Neutrophil %: 49.1 %
PLATELETS: 170 x10 3/mm (ref 150–440)
RBC: 3.75 10*6/uL — ABNORMAL LOW (ref 4.40–5.90)
RDW: 17.9 % — ABNORMAL HIGH (ref 11.5–14.5)
WBC: 4.1 x10 3/mm (ref 3.8–10.6)

## 2014-06-17 LAB — PROTEIN, URINE, RANDOM: Protein, Random Urine: 6 mg/dL (ref 0–12)

## 2014-07-01 LAB — COMPREHENSIVE METABOLIC PANEL
ALK PHOS: 67 U/L
Albumin: 3 g/dL — ABNORMAL LOW (ref 3.4–5.0)
Anion Gap: 10 (ref 7–16)
BILIRUBIN TOTAL: 0.4 mg/dL (ref 0.2–1.0)
BUN: 12 mg/dL (ref 7–18)
CALCIUM: 8.4 mg/dL — AB (ref 8.5–10.1)
CHLORIDE: 103 mmol/L (ref 98–107)
CO2: 26 mmol/L (ref 21–32)
CREATININE: 1.26 mg/dL (ref 0.60–1.30)
GFR CALC NON AF AMER: 59 — AB
GLUCOSE: 109 mg/dL — AB (ref 65–99)
Osmolality: 278 (ref 275–301)
Potassium: 4 mmol/L (ref 3.5–5.1)
SGOT(AST): 26 U/L (ref 15–37)
SGPT (ALT): 29 U/L
Sodium: 139 mmol/L (ref 136–145)
Total Protein: 6.6 g/dL (ref 6.4–8.2)

## 2014-07-01 LAB — CBC CANCER CENTER
BASOS PCT: 3.6 %
Basophil #: 0.1 x10 3/mm (ref 0.0–0.1)
Eosinophil #: 0.1 x10 3/mm (ref 0.0–0.7)
Eosinophil %: 2 %
HCT: 37.9 % — AB (ref 40.0–52.0)
HGB: 12.5 g/dL — ABNORMAL LOW (ref 13.0–18.0)
LYMPHS PCT: 25.1 %
Lymphocyte #: 0.8 x10 3/mm — ABNORMAL LOW (ref 1.0–3.6)
MCH: 32.8 pg (ref 26.0–34.0)
MCHC: 32.9 g/dL (ref 32.0–36.0)
MCV: 100 fL (ref 80–100)
MONO ABS: 0.7 x10 3/mm (ref 0.2–1.0)
Monocyte %: 22.1 %
NEUTROS PCT: 47.2 %
Neutrophil #: 1.5 x10 3/mm (ref 1.4–6.5)
Platelet: 123 x10 3/mm — ABNORMAL LOW (ref 150–440)
RBC: 3.81 10*6/uL — AB (ref 4.40–5.90)
RDW: 19 % — ABNORMAL HIGH (ref 11.5–14.5)
WBC: 3.2 x10 3/mm — AB (ref 3.8–10.6)

## 2014-07-01 LAB — PROTEIN, URINE, RANDOM: Protein, Random Urine: 13 mg/dL — ABNORMAL HIGH (ref 0–12)

## 2014-07-15 ENCOUNTER — Ambulatory Visit: Payer: Self-pay | Admitting: Oncology

## 2014-07-15 LAB — CBC CANCER CENTER
BASOS ABS: 0.1 x10 3/mm (ref 0.0–0.1)
Basophil %: 2.8 %
Eosinophil #: 0.1 x10 3/mm (ref 0.0–0.7)
Eosinophil %: 1.8 %
HCT: 38.4 % — AB (ref 40.0–52.0)
HGB: 12.6 g/dL — ABNORMAL LOW (ref 13.0–18.0)
LYMPHS ABS: 1 x10 3/mm (ref 1.0–3.6)
Lymphocyte %: 32.8 %
MCH: 33 pg (ref 26.0–34.0)
MCHC: 32.9 g/dL (ref 32.0–36.0)
MCV: 100 fL (ref 80–100)
MONO ABS: 0.7 x10 3/mm (ref 0.2–1.0)
Monocyte %: 21.7 %
NEUTROS PCT: 40.9 %
Neutrophil #: 1.3 x10 3/mm — ABNORMAL LOW (ref 1.4–6.5)
Platelet: 119 x10 3/mm — ABNORMAL LOW (ref 150–440)
RBC: 3.82 10*6/uL — ABNORMAL LOW (ref 4.40–5.90)
RDW: 20.9 % — ABNORMAL HIGH (ref 11.5–14.5)
WBC: 3.1 x10 3/mm — ABNORMAL LOW (ref 3.8–10.6)

## 2014-07-15 LAB — COMPREHENSIVE METABOLIC PANEL
ANION GAP: 9 (ref 7–16)
AST: 27 U/L (ref 15–37)
Albumin: 3 g/dL — ABNORMAL LOW (ref 3.4–5.0)
Alkaline Phosphatase: 66 U/L
BUN: 15 mg/dL (ref 7–18)
Bilirubin,Total: 0.6 mg/dL (ref 0.2–1.0)
Calcium, Total: 8.4 mg/dL — ABNORMAL LOW (ref 8.5–10.1)
Chloride: 104 mmol/L (ref 98–107)
Co2: 26 mmol/L (ref 21–32)
Creatinine: 1.39 mg/dL — ABNORMAL HIGH (ref 0.60–1.30)
EGFR (Non-African Amer.): 52 — ABNORMAL LOW
Glucose: 94 mg/dL (ref 65–99)
OSMOLALITY: 278 (ref 275–301)
POTASSIUM: 3.9 mmol/L (ref 3.5–5.1)
SGPT (ALT): 29 U/L
SODIUM: 139 mmol/L (ref 136–145)
Total Protein: 6.5 g/dL (ref 6.4–8.2)

## 2014-07-15 LAB — PROTIME-INR
INR: 2.7
PROTHROMBIN TIME: 28.3 s — AB (ref 11.5–14.7)

## 2014-07-15 LAB — PROTEIN, URINE, RANDOM: Protein, Random Urine: 28 mg/dL — ABNORMAL HIGH (ref 0–12)

## 2014-07-29 LAB — CBC CANCER CENTER
Basophil #: 0.1 x10 3/mm (ref 0.0–0.1)
Basophil %: 3.2 %
EOS PCT: 1.1 %
Eosinophil #: 0 x10 3/mm (ref 0.0–0.7)
HCT: 39.3 % — ABNORMAL LOW (ref 40.0–52.0)
HGB: 12.7 g/dL — ABNORMAL LOW (ref 13.0–18.0)
Lymphocyte #: 0.7 x10 3/mm — ABNORMAL LOW (ref 1.0–3.6)
Lymphocyte %: 28.6 %
MCH: 33.1 pg (ref 26.0–34.0)
MCHC: 32.3 g/dL (ref 32.0–36.0)
MCV: 103 fL — ABNORMAL HIGH (ref 80–100)
MONO ABS: 0.6 x10 3/mm (ref 0.2–1.0)
Monocyte %: 24.8 %
NEUTROS ABS: 1.1 x10 3/mm — AB (ref 1.4–6.5)
Neutrophil %: 42.3 %
Platelet: 95 x10 3/mm — ABNORMAL LOW (ref 150–440)
RBC: 3.83 10*6/uL — AB (ref 4.40–5.90)
RDW: 22.8 % — ABNORMAL HIGH (ref 11.5–14.5)
WBC: 2.5 x10 3/mm — AB (ref 3.8–10.6)

## 2014-07-29 LAB — COMPREHENSIVE METABOLIC PANEL
ALK PHOS: 66 U/L
Albumin: 3 g/dL — ABNORMAL LOW (ref 3.4–5.0)
Anion Gap: 9 (ref 7–16)
BUN: 11 mg/dL (ref 7–18)
Bilirubin,Total: 0.5 mg/dL (ref 0.2–1.0)
CO2: 22 mmol/L (ref 21–32)
CREATININE: 1.3 mg/dL (ref 0.60–1.30)
Calcium, Total: 8.5 mg/dL (ref 8.5–10.1)
Chloride: 109 mmol/L — ABNORMAL HIGH (ref 98–107)
EGFR (Non-African Amer.): 56 — ABNORMAL LOW
GLUCOSE: 108 mg/dL — AB (ref 65–99)
Osmolality: 279 (ref 275–301)
Potassium: 3.6 mmol/L (ref 3.5–5.1)
SGOT(AST): 36 U/L (ref 15–37)
SGPT (ALT): 30 U/L
SODIUM: 140 mmol/L (ref 136–145)
TOTAL PROTEIN: 6.5 g/dL (ref 6.4–8.2)

## 2014-07-29 LAB — PROTEIN, URINE, RANDOM: Protein, Random Urine: 27 mg/dL — ABNORMAL HIGH (ref 0–12)

## 2014-08-05 LAB — CBC CANCER CENTER
BASOS PCT: 2.6 %
Basophil #: 0.1 x10 3/mm (ref 0.0–0.1)
EOS ABS: 0.2 x10 3/mm (ref 0.0–0.7)
Eosinophil %: 7.6 %
HCT: 39.9 % — ABNORMAL LOW (ref 40.0–52.0)
HGB: 12.7 g/dL — AB (ref 13.0–18.0)
Lymphocyte #: 0.8 x10 3/mm — ABNORMAL LOW (ref 1.0–3.6)
Lymphocyte %: 33.7 %
MCH: 33.1 pg (ref 26.0–34.0)
MCHC: 31.8 g/dL — AB (ref 32.0–36.0)
MCV: 104 fL — ABNORMAL HIGH (ref 80–100)
Monocyte #: 0.7 x10 3/mm (ref 0.2–1.0)
Monocyte %: 31.7 %
NEUTROS PCT: 24.4 %
Neutrophil #: 0.5 x10 3/mm — ABNORMAL LOW (ref 1.4–6.5)
PLATELETS: 117 x10 3/mm — AB (ref 150–440)
RBC: 3.83 10*6/uL — ABNORMAL LOW (ref 4.40–5.90)
RDW: 23 % — ABNORMAL HIGH (ref 11.5–14.5)
WBC: 2.3 x10 3/mm — ABNORMAL LOW (ref 3.8–10.6)

## 2014-08-05 LAB — COMPREHENSIVE METABOLIC PANEL
Albumin: 3 g/dL — ABNORMAL LOW (ref 3.4–5.0)
Alkaline Phosphatase: 77 U/L
Anion Gap: 8 (ref 7–16)
BILIRUBIN TOTAL: 0.4 mg/dL (ref 0.2–1.0)
BUN: 13 mg/dL (ref 7–18)
CREATININE: 1.3 mg/dL (ref 0.60–1.30)
Calcium, Total: 8.9 mg/dL (ref 8.5–10.1)
Chloride: 102 mmol/L (ref 98–107)
Co2: 25 mmol/L (ref 21–32)
EGFR (Non-African Amer.): 56 — ABNORMAL LOW
GLUCOSE: 111 mg/dL — AB (ref 65–99)
Osmolality: 271 (ref 275–301)
POTASSIUM: 4 mmol/L (ref 3.5–5.1)
SGOT(AST): 29 U/L (ref 15–37)
SGPT (ALT): 33 U/L
Sodium: 135 mmol/L — ABNORMAL LOW (ref 136–145)
Total Protein: 6.4 g/dL (ref 6.4–8.2)

## 2014-08-12 LAB — CBC CANCER CENTER
BASOS ABS: 0.1 x10 3/mm (ref 0.0–0.1)
BASOS PCT: 2.1 %
EOS PCT: 4.7 %
Eosinophil #: 0.2 x10 3/mm (ref 0.0–0.7)
HCT: 42 % (ref 40.0–52.0)
HGB: 13.6 g/dL (ref 13.0–18.0)
LYMPHS PCT: 21.9 %
Lymphocyte #: 0.9 x10 3/mm — ABNORMAL LOW (ref 1.0–3.6)
MCH: 33.9 pg (ref 26.0–34.0)
MCHC: 32.3 g/dL (ref 32.0–36.0)
MCV: 105 fL — ABNORMAL HIGH (ref 80–100)
Monocyte #: 1 x10 3/mm (ref 0.2–1.0)
Monocyte %: 26.1 %
NEUTROS ABS: 1.8 x10 3/mm (ref 1.4–6.5)
Neutrophil %: 45.2 %
Platelet: 98 x10 3/mm — ABNORMAL LOW (ref 150–440)
RBC: 4.01 10*6/uL — ABNORMAL LOW (ref 4.40–5.90)
RDW: 22.4 % — AB (ref 11.5–14.5)
WBC: 3.9 x10 3/mm (ref 3.8–10.6)

## 2014-08-12 LAB — COMPREHENSIVE METABOLIC PANEL
ALBUMIN: 2.9 g/dL — AB (ref 3.4–5.0)
ALK PHOS: 80 U/L
ALT: 22 U/L
Anion Gap: 7 (ref 7–16)
BILIRUBIN TOTAL: 0.4 mg/dL (ref 0.2–1.0)
BUN: 10 mg/dL (ref 7–18)
CALCIUM: 8.8 mg/dL (ref 8.5–10.1)
Chloride: 105 mmol/L (ref 98–107)
Co2: 27 mmol/L (ref 21–32)
Creatinine: 1.14 mg/dL (ref 0.60–1.30)
EGFR (African American): >60
Glucose: 117 mg/dL — ABNORMAL HIGH (ref 65–99)
Osmolality: 278 (ref 275–301)
Potassium: 4.3 mmol/L (ref 3.5–5.1)
SGOT(AST): 21 U/L (ref 15–37)
SODIUM: 139 mmol/L (ref 136–145)
Total Protein: 6 g/dL — ABNORMAL LOW (ref 6.4–8.2)

## 2014-08-14 ENCOUNTER — Ambulatory Visit: Payer: Self-pay | Admitting: Oncology

## 2014-08-26 LAB — COMPREHENSIVE METABOLIC PANEL
ALBUMIN: 3 g/dL — AB (ref 3.4–5.0)
ALK PHOS: 108 U/L
ANION GAP: 9 (ref 7–16)
AST: 29 U/L (ref 15–37)
BUN: 13 mg/dL (ref 7–18)
Bilirubin,Total: 0.4 mg/dL (ref 0.2–1.0)
CHLORIDE: 106 mmol/L (ref 98–107)
Calcium, Total: 8.8 mg/dL (ref 8.5–10.1)
Co2: 24 mmol/L (ref 21–32)
Creatinine: 1.33 mg/dL — ABNORMAL HIGH (ref 0.60–1.30)
EGFR (African American): >60
GFR CALC NON AF AMER: 57 — AB
GLUCOSE: 99 mg/dL (ref 65–99)
Osmolality: 278 (ref 275–301)
Potassium: 4 mmol/L (ref 3.5–5.1)
SGPT (ALT): 26 U/L
Sodium: 139 mmol/L (ref 136–145)
Total Protein: 6.2 g/dL — ABNORMAL LOW (ref 6.4–8.2)

## 2014-08-26 LAB — CBC CANCER CENTER
Basophil #: 0.1 x10 3/mm (ref 0.0–0.1)
Basophil %: 1.3 %
EOS ABS: 0.2 x10 3/mm (ref 0.0–0.7)
Eosinophil %: 2.1 %
HCT: 41.4 % (ref 40.0–52.0)
HGB: 13.6 g/dL (ref 13.0–18.0)
LYMPHS ABS: 0.9 x10 3/mm — AB (ref 1.0–3.6)
Lymphocyte %: 11.1 %
MCH: 34.4 pg — ABNORMAL HIGH (ref 26.0–34.0)
MCHC: 32.9 g/dL (ref 32.0–36.0)
MCV: 105 fL — ABNORMAL HIGH (ref 80–100)
MONO ABS: 0.9 x10 3/mm (ref 0.2–1.0)
MONOS PCT: 11.2 %
NEUTROS ABS: 6.3 x10 3/mm (ref 1.4–6.5)
Neutrophil %: 74.3 %
Platelet: 95 x10 3/mm — ABNORMAL LOW (ref 150–440)
RBC: 3.96 10*6/uL — AB (ref 4.40–5.90)
RDW: 20.7 % — ABNORMAL HIGH (ref 11.5–14.5)
WBC: 8.4 x10 3/mm (ref 3.8–10.6)

## 2014-09-09 LAB — CBC CANCER CENTER
Basophil #: 0.4 x10 3/mm — ABNORMAL HIGH (ref 0.0–0.1)
Basophil %: 5.2 %
EOS PCT: 2 %
Eosinophil #: 0.2 x10 3/mm (ref 0.0–0.7)
HCT: 40.6 % (ref 40.0–52.0)
HGB: 13.4 g/dL (ref 13.0–18.0)
LYMPHS ABS: 1.1 x10 3/mm (ref 1.0–3.6)
Lymphocyte %: 14.4 %
MCH: 34.8 pg — ABNORMAL HIGH (ref 26.0–34.0)
MCHC: 32.9 g/dL (ref 32.0–36.0)
MCV: 106 fL — AB (ref 80–100)
Monocyte #: 1 x10 3/mm (ref 0.2–1.0)
Monocyte %: 13.4 %
NEUTROS ABS: 5.1 x10 3/mm (ref 1.4–6.5)
NEUTROS PCT: 65 %
PLATELETS: 69 x10 3/mm — AB (ref 150–440)
RBC: 3.85 10*6/uL — ABNORMAL LOW (ref 4.40–5.90)
RDW: 19.4 % — AB (ref 11.5–14.5)
WBC: 7.8 x10 3/mm (ref 3.8–10.6)

## 2014-09-09 LAB — COMPREHENSIVE METABOLIC PANEL
ALBUMIN: 2.9 g/dL — AB (ref 3.4–5.0)
ALT: 26 U/L
Alkaline Phosphatase: 110 U/L
Anion Gap: 9 (ref 7–16)
BUN: 11 mg/dL (ref 7–18)
Bilirubin,Total: 0.4 mg/dL (ref 0.2–1.0)
Calcium, Total: 8.7 mg/dL (ref 8.5–10.1)
Chloride: 106 mmol/L (ref 98–107)
Co2: 24 mmol/L (ref 21–32)
Creatinine: 1.23 mg/dL (ref 0.60–1.30)
EGFR (African American): >60
GLUCOSE: 99 mg/dL (ref 65–99)
OSMOLALITY: 277 (ref 275–301)
Potassium: 3.8 mmol/L (ref 3.5–5.1)
SGOT(AST): 24 U/L (ref 15–37)
Sodium: 139 mmol/L (ref 136–145)
Total Protein: 6.1 g/dL — ABNORMAL LOW (ref 6.4–8.2)

## 2014-09-14 ENCOUNTER — Ambulatory Visit: Payer: Self-pay | Admitting: Oncology

## 2014-09-16 LAB — CBC CANCER CENTER
Basophil #: 0.1 x10 3/mm (ref 0.0–0.1)
Basophil %: 1.3 %
EOS ABS: 0.3 x10 3/mm (ref 0.0–0.7)
EOS PCT: 4.7 %
HCT: 41 % (ref 40.0–52.0)
HGB: 13.3 g/dL (ref 13.0–18.0)
LYMPHS ABS: 1 x10 3/mm (ref 1.0–3.6)
Lymphocyte %: 17.1 %
MCH: 34.7 pg — AB (ref 26.0–34.0)
MCHC: 32.5 g/dL (ref 32.0–36.0)
MCV: 107 fL — ABNORMAL HIGH (ref 80–100)
MONO ABS: 1.2 x10 3/mm — AB (ref 0.2–1.0)
Monocyte %: 21.6 %
Neutrophil #: 3.1 x10 3/mm (ref 1.4–6.5)
Neutrophil %: 55.3 %
PLATELETS: 122 x10 3/mm — AB (ref 150–440)
RBC: 3.84 10*6/uL — ABNORMAL LOW (ref 4.40–5.90)
RDW: 18.8 % — AB (ref 11.5–14.5)
WBC: 5.7 x10 3/mm (ref 3.8–10.6)

## 2014-09-16 LAB — COMPREHENSIVE METABOLIC PANEL
ALBUMIN: 2.8 g/dL — AB (ref 3.4–5.0)
ANION GAP: 4 — AB (ref 7–16)
AST: 23 U/L (ref 15–37)
Alkaline Phosphatase: 94 U/L
BUN: 11 mg/dL (ref 7–18)
Bilirubin,Total: 0.5 mg/dL (ref 0.2–1.0)
CREATININE: 1.15 mg/dL (ref 0.60–1.30)
Calcium, Total: 9 mg/dL (ref 8.5–10.1)
Chloride: 104 mmol/L (ref 98–107)
Co2: 29 mmol/L (ref 21–32)
GLUCOSE: 101 mg/dL — AB (ref 65–99)
Osmolality: 273 (ref 275–301)
POTASSIUM: 4 mmol/L (ref 3.5–5.1)
SGPT (ALT): 21 U/L
SODIUM: 137 mmol/L (ref 136–145)
Total Protein: 6 g/dL — ABNORMAL LOW (ref 6.4–8.2)

## 2014-09-16 LAB — PROTEIN, URINE, RANDOM: Protein, Random Urine: 7 mg/dL (ref 0–12)

## 2014-09-30 LAB — COMPREHENSIVE METABOLIC PANEL
ALT: 23 U/L
AST: 23 U/L (ref 15–37)
Albumin: 3.1 g/dL — ABNORMAL LOW (ref 3.4–5.0)
Alkaline Phosphatase: 115 U/L
Anion Gap: 9 (ref 7–16)
BUN: 17 mg/dL (ref 7–18)
Bilirubin,Total: 0.5 mg/dL (ref 0.2–1.0)
CO2: 26 mmol/L (ref 21–32)
Calcium, Total: 8.9 mg/dL (ref 8.5–10.1)
Chloride: 105 mmol/L (ref 98–107)
Creatinine: 1.32 mg/dL — ABNORMAL HIGH (ref 0.60–1.30)
EGFR (Non-African Amer.): 58 — ABNORMAL LOW
Glucose: 103 mg/dL — ABNORMAL HIGH (ref 65–99)
OSMOLALITY: 281 (ref 275–301)
Potassium: 4.2 mmol/L (ref 3.5–5.1)
SODIUM: 140 mmol/L (ref 136–145)
TOTAL PROTEIN: 6.4 g/dL (ref 6.4–8.2)

## 2014-09-30 LAB — CBC CANCER CENTER
Basophil #: 0.4 x10 3/mm — ABNORMAL HIGH (ref 0.0–0.1)
Basophil %: 4.3 %
EOS ABS: 0.3 x10 3/mm (ref 0.0–0.7)
EOS PCT: 3.7 %
HCT: 42.9 % (ref 40.0–52.0)
HGB: 13.9 g/dL (ref 13.0–18.0)
LYMPHS ABS: 1.1 x10 3/mm (ref 1.0–3.6)
Lymphocyte %: 12.8 %
MCH: 34.6 pg — ABNORMAL HIGH (ref 26.0–34.0)
MCHC: 32.4 g/dL (ref 32.0–36.0)
MCV: 107 fL — ABNORMAL HIGH (ref 80–100)
Monocyte #: 1.2 x10 3/mm — ABNORMAL HIGH (ref 0.2–1.0)
Monocyte %: 14.1 %
NEUTROS ABS: 5.5 x10 3/mm (ref 1.4–6.5)
NEUTROS PCT: 65.1 %
Platelet: 89 x10 3/mm — ABNORMAL LOW (ref 150–440)
RBC: 4.03 10*6/uL — ABNORMAL LOW (ref 4.40–5.90)
RDW: 17 % — ABNORMAL HIGH (ref 11.5–14.5)
WBC: 8.4 x10 3/mm (ref 3.8–10.6)

## 2014-09-30 LAB — PROTEIN, URINE, RANDOM: Protein, Random Urine: 23 mg/dL — ABNORMAL HIGH (ref 0–12)

## 2014-10-14 ENCOUNTER — Ambulatory Visit: Payer: Self-pay | Admitting: Oncology

## 2014-10-21 LAB — COMPREHENSIVE METABOLIC PANEL
ANION GAP: 5 — AB (ref 7–16)
Albumin: 3 g/dL — ABNORMAL LOW (ref 3.4–5.0)
Alkaline Phosphatase: 96 U/L
BILIRUBIN TOTAL: 0.6 mg/dL (ref 0.2–1.0)
BUN: 12 mg/dL (ref 7–18)
CO2: 27 mmol/L (ref 21–32)
CREATININE: 1.34 mg/dL — AB (ref 0.60–1.30)
Calcium, Total: 8.9 mg/dL (ref 8.5–10.1)
Chloride: 106 mmol/L (ref 98–107)
EGFR (African American): >60
GFR CALC NON AF AMER: 57 — AB
Glucose: 96 mg/dL (ref 65–99)
OSMOLALITY: 275 (ref 275–301)
Potassium: 4.3 mmol/L (ref 3.5–5.1)
SGOT(AST): 28 U/L (ref 15–37)
SGPT (ALT): 23 U/L
Sodium: 138 mmol/L (ref 136–145)
Total Protein: 6 g/dL — ABNORMAL LOW (ref 6.4–8.2)

## 2014-10-21 LAB — CBC CANCER CENTER
Basophil #: 0 x10 3/mm (ref 0.0–0.1)
Basophil %: 0.8 %
EOS ABS: 0.2 x10 3/mm (ref 0.0–0.7)
Eosinophil %: 3.7 %
HCT: 41.4 % (ref 40.0–52.0)
HGB: 13.5 g/dL (ref 13.0–18.0)
LYMPHS PCT: 15.4 %
Lymphocyte #: 0.9 x10 3/mm — ABNORMAL LOW (ref 1.0–3.6)
MCH: 35.4 pg — ABNORMAL HIGH (ref 26.0–34.0)
MCHC: 32.5 g/dL (ref 32.0–36.0)
MCV: 109 fL — ABNORMAL HIGH (ref 80–100)
MONO ABS: 1.1 x10 3/mm — AB (ref 0.2–1.0)
MONOS PCT: 18 %
Neutrophil #: 3.7 x10 3/mm (ref 1.4–6.5)
Neutrophil %: 62.1 %
Platelet: 82 x10 3/mm — ABNORMAL LOW (ref 150–440)
RBC: 3.81 10*6/uL — ABNORMAL LOW (ref 4.40–5.90)
RDW: 17.2 % — ABNORMAL HIGH (ref 11.5–14.5)
WBC: 5.9 x10 3/mm (ref 3.8–10.6)

## 2014-10-21 LAB — PROTEIN, URINE, RANDOM: PROTEIN, RANDOM URINE: 9 mg/dL (ref 0–12)

## 2014-11-14 ENCOUNTER — Ambulatory Visit: Payer: Self-pay | Admitting: Oncology

## 2014-11-18 LAB — CBC CANCER CENTER
BASOS ABS: 0.2 x10 3/mm — AB (ref 0.0–0.1)
Basophil %: 3.3 %
EOS ABS: 0.4 x10 3/mm (ref 0.0–0.7)
Eosinophil %: 9 %
HCT: 40.8 % (ref 40.0–52.0)
HGB: 13.4 g/dL (ref 13.0–18.0)
LYMPHS PCT: 14 %
Lymphocyte #: 0.7 x10 3/mm — ABNORMAL LOW (ref 1.0–3.6)
MCH: 34.6 pg — ABNORMAL HIGH (ref 26.0–34.0)
MCHC: 32.8 g/dL (ref 32.0–36.0)
MCV: 106 fL — ABNORMAL HIGH (ref 80–100)
Monocyte #: 0.9 x10 3/mm (ref 0.2–1.0)
Monocyte %: 18.2 %
NEUTROS ABS: 2.6 x10 3/mm (ref 1.4–6.5)
Neutrophil %: 55.5 %
PLATELETS: 106 x10 3/mm — AB (ref 150–440)
RBC: 3.87 10*6/uL — ABNORMAL LOW (ref 4.40–5.90)
RDW: 17.1 % — AB (ref 11.5–14.5)
WBC: 4.7 x10 3/mm (ref 3.8–10.6)

## 2014-11-18 LAB — COMPREHENSIVE METABOLIC PANEL
ALK PHOS: 90 U/L
AST: 22 U/L (ref 15–37)
Albumin: 2.8 g/dL — ABNORMAL LOW (ref 3.4–5.0)
Anion Gap: 8 (ref 7–16)
BUN: 12 mg/dL (ref 7–18)
Bilirubin,Total: 0.6 mg/dL (ref 0.2–1.0)
CALCIUM: 8.3 mg/dL — AB (ref 8.5–10.1)
CO2: 25 mmol/L (ref 21–32)
Chloride: 105 mmol/L (ref 98–107)
Creatinine: 1.15 mg/dL (ref 0.60–1.30)
EGFR (Non-African Amer.): >60
Glucose: 102 mg/dL — ABNORMAL HIGH (ref 65–99)
OSMOLALITY: 276 (ref 275–301)
Potassium: 4.2 mmol/L (ref 3.5–5.1)
SGPT (ALT): 18 U/L
SODIUM: 138 mmol/L (ref 136–145)
Total Protein: 6 g/dL — ABNORMAL LOW (ref 6.4–8.2)

## 2014-11-18 LAB — PROTEIN, URINE, RANDOM: Protein, Random Urine: 24 mg/dL — ABNORMAL HIGH (ref 0–12)

## 2014-12-09 LAB — COMPREHENSIVE METABOLIC PANEL
ALK PHOS: 90 U/L (ref 46–116)
ALT: 20 U/L (ref 14–63)
ANION GAP: 7 (ref 7–16)
Albumin: 2.8 g/dL — ABNORMAL LOW (ref 3.4–5.0)
BILIRUBIN TOTAL: 0.6 mg/dL (ref 0.2–1.0)
BUN: 15 mg/dL (ref 7–18)
CO2: 26 mmol/L (ref 21–32)
CREATININE: 1.08 mg/dL (ref 0.60–1.30)
Calcium, Total: 8.2 mg/dL — ABNORMAL LOW (ref 8.5–10.1)
Chloride: 107 mmol/L (ref 98–107)
EGFR (African American): >60
EGFR (Non-African Amer.): >60
GLUCOSE: 103 mg/dL — AB (ref 65–99)
Osmolality: 280 (ref 275–301)
Potassium: 4.3 mmol/L (ref 3.5–5.1)
SGOT(AST): 25 U/L (ref 15–37)
Sodium: 140 mmol/L (ref 136–145)
Total Protein: 6.1 g/dL — ABNORMAL LOW (ref 6.4–8.2)

## 2014-12-09 LAB — CBC CANCER CENTER
BASOS ABS: 0 x10 3/mm (ref 0.0–0.1)
Basophil %: 0.6 %
Eosinophil #: 0.4 x10 3/mm (ref 0.0–0.7)
Eosinophil %: 5.4 %
HCT: 41.5 % (ref 40.0–52.0)
HGB: 13.6 g/dL (ref 13.0–18.0)
LYMPHS ABS: 1 x10 3/mm (ref 1.0–3.6)
Lymphocyte %: 14.6 %
MCH: 34.6 pg — ABNORMAL HIGH (ref 26.0–34.0)
MCHC: 32.9 g/dL (ref 32.0–36.0)
MCV: 105 fL — ABNORMAL HIGH (ref 80–100)
MONO ABS: 1 x10 3/mm (ref 0.2–1.0)
MONOS PCT: 15.3 %
Neutrophil #: 4.2 x10 3/mm (ref 1.4–6.5)
Neutrophil %: 64.1 %
PLATELETS: 83 x10 3/mm — AB (ref 150–440)
RBC: 3.95 10*6/uL — ABNORMAL LOW (ref 4.40–5.90)
RDW: 17.1 % — ABNORMAL HIGH (ref 11.5–14.5)
WBC: 6.5 x10 3/mm (ref 3.8–10.6)

## 2014-12-09 LAB — PROTEIN, URINE, RANDOM: Protein, Random Urine: 16 mg/dL — ABNORMAL HIGH (ref 0–12)

## 2014-12-15 ENCOUNTER — Ambulatory Visit: Payer: Self-pay | Admitting: Oncology

## 2015-01-13 ENCOUNTER — Ambulatory Visit
Admit: 2015-01-13 | Disposition: A | Discharge status: Home / Self Care | Payer: Self-pay | Attending: Oncology | Admitting: Oncology

## 2015-01-26 ENCOUNTER — Ambulatory Visit: Payer: Self-pay | Admitting: Oncology

## 2015-01-27 IMAGING — US US EXTREM LOW VENOUS*L*
1 series · 13 of 24 positions shown · non-contrast
Comparison: None.

CLINICAL DATA: Eval for DVT  swelling left lower EXT



[Series 1: us extrem low venous*left* · 0.08mm/px · 13 of 28 slices shown]
[im 1/28]
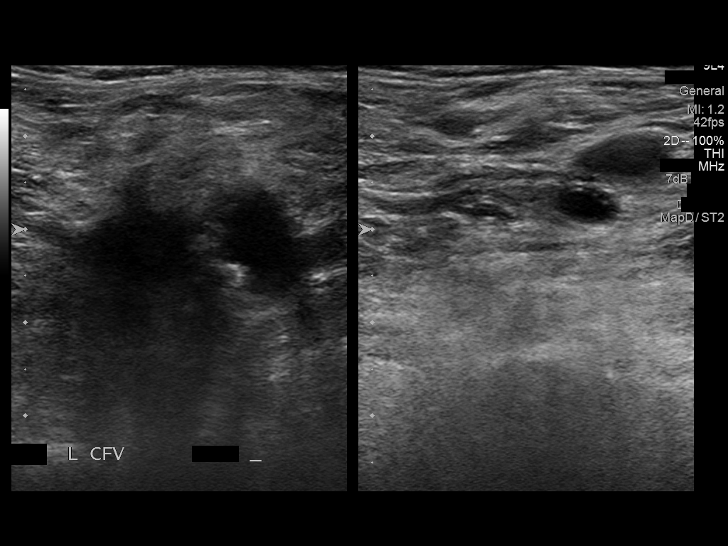
[im 3/28]
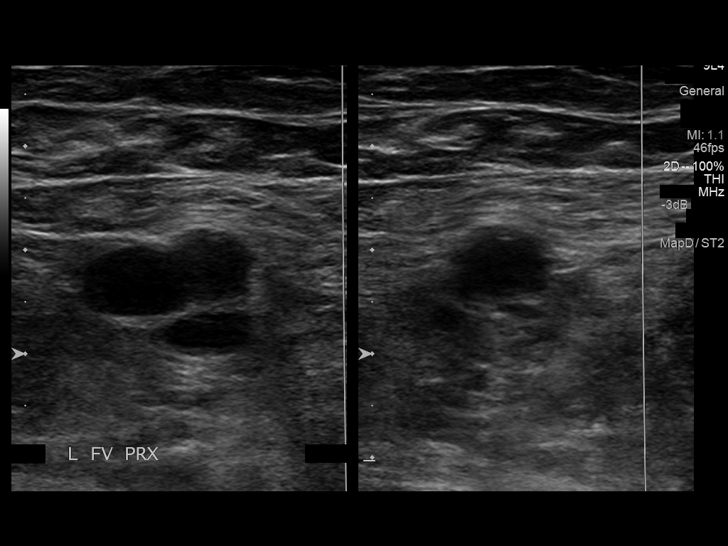
[im 5/28]
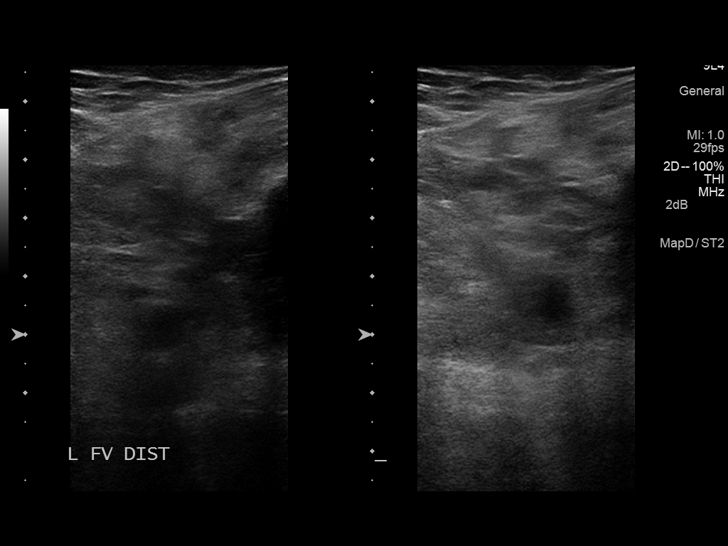
[im 8/28]
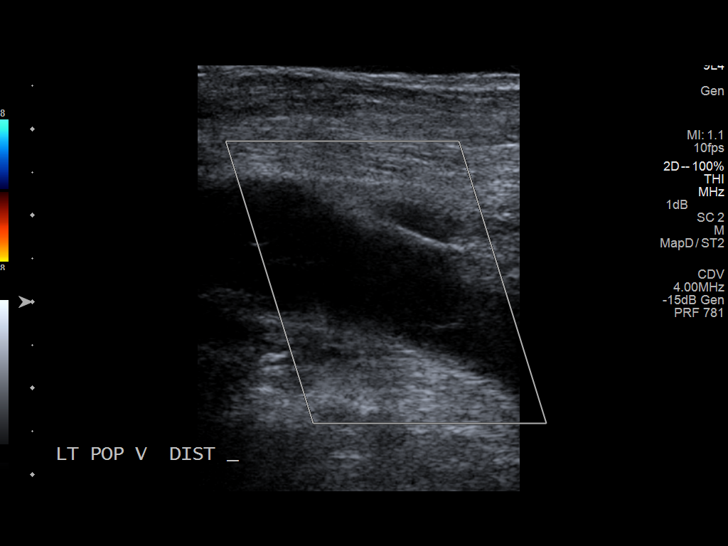
[im 10/28]
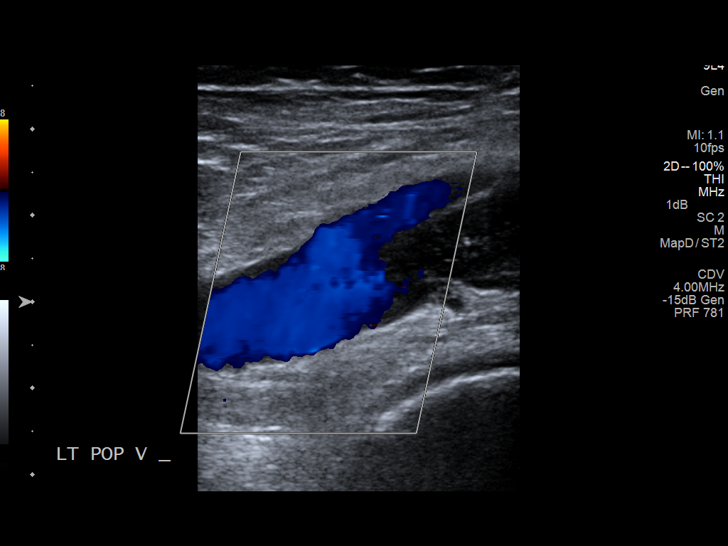
[im 12/28]
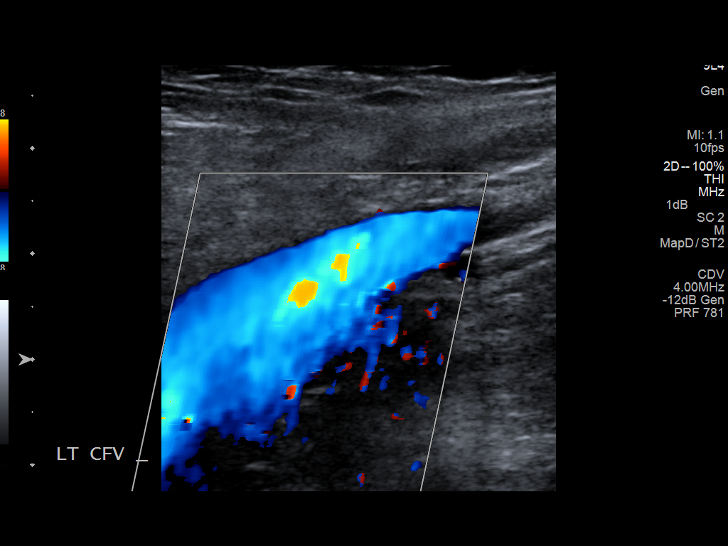
[im 15/28]
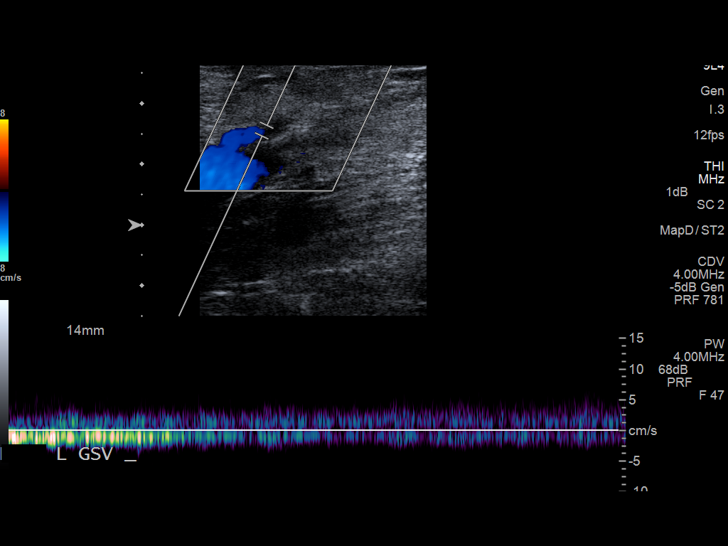
[im 16/28]
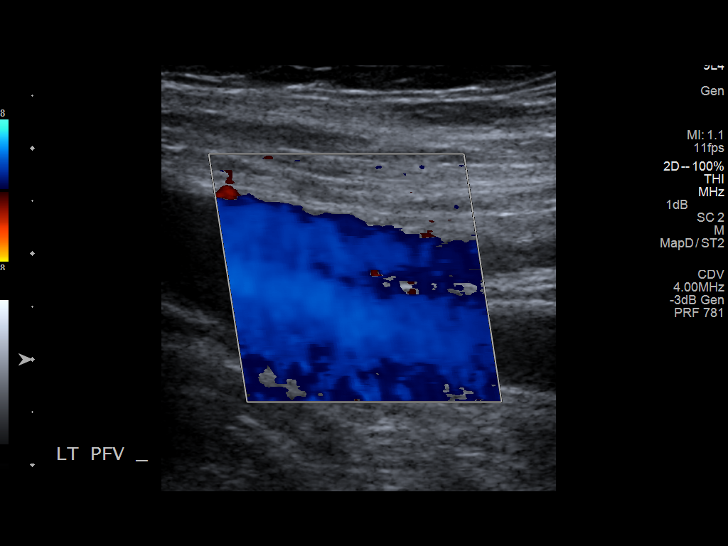
[im 18/28]
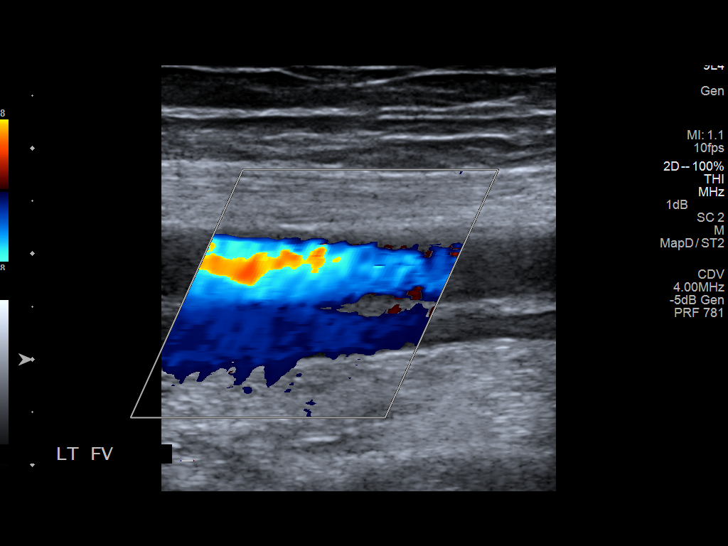
[im 20/28]
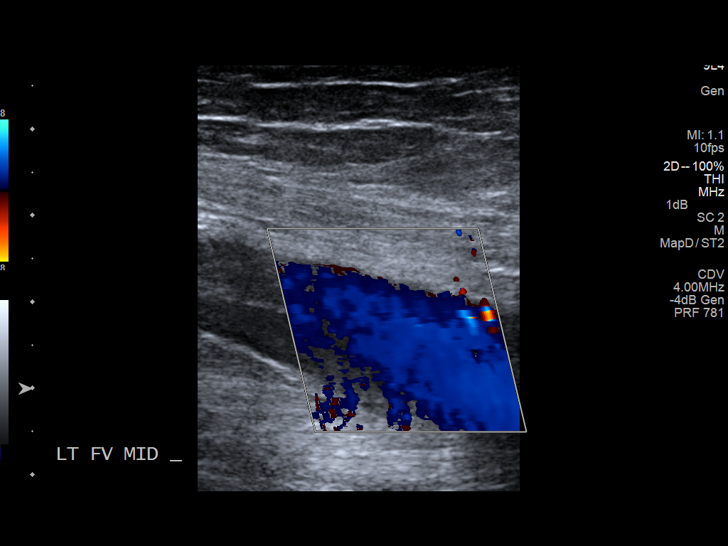
[im 23/28]
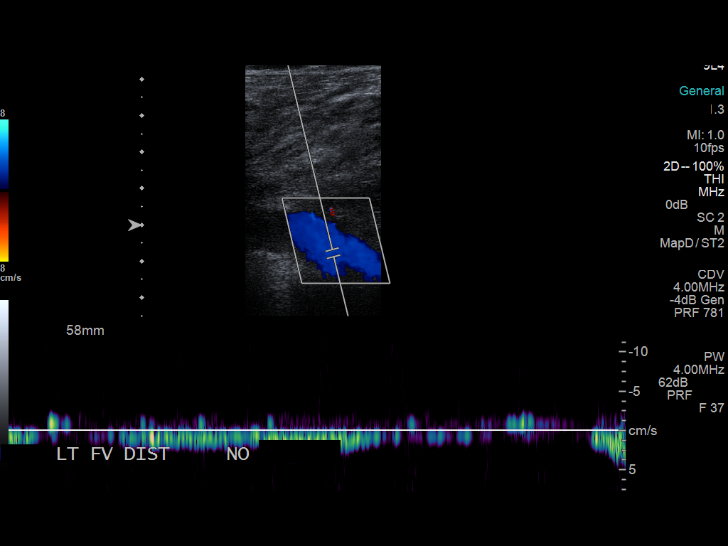
[im 25/28]
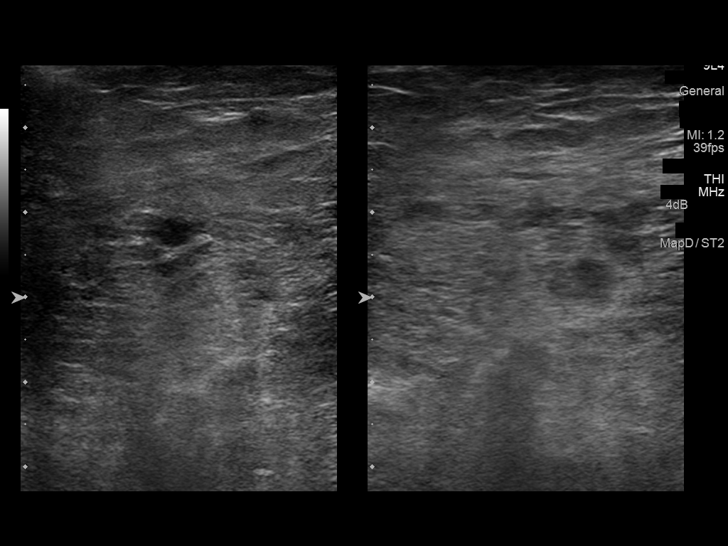
[im 28/28]
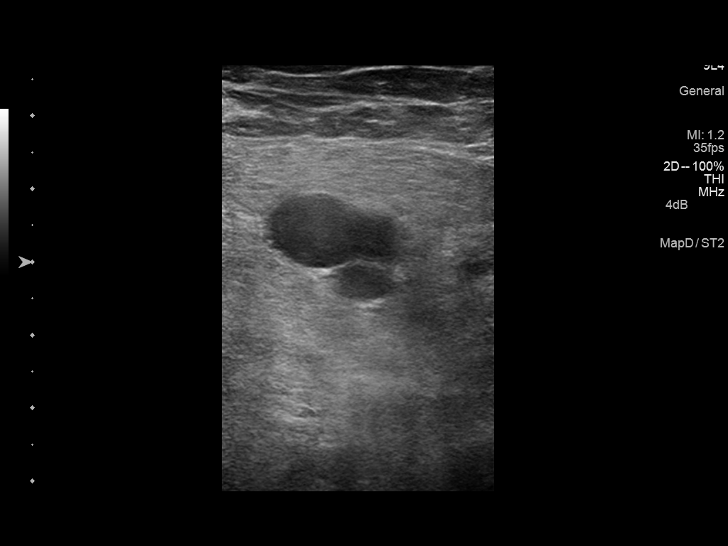

[13 of 24 positions shown; findings below may reference images not displayed]

FINDINGS: Common Femoral Vein: No evidence of thrombus. Normal
compressibility, respiratory phasicity and response to augmentation.

Saphenofemoral Junction: No evidence of thrombus. Normal
compressibility and flow on color Doppler imaging.

Profunda Femoral Vein: No evidence of thrombus. Normal
compressibility and flow on color Doppler imaging.

Femoral Vein: No evidence of thrombus. Normal compressibility,
respiratory phasicity and response to augmentation.

Popliteal Vein: An area of occlusive thrombus within the mid to
distal portion of the popliteal vein. This area is noncompressible
with absent color filling. There is no appreciable augmentation nor
phasicity.

Calf Veins: No evidence of thrombus. Normal compressibility and flow
on color Doppler imaging.

Superficial Great Saphenous Vein: No evidence of thrombus. Normal
compressibility and flow on color Doppler imaging.

Venous Reflux:  None.

Other Findings:  None.
IMPRESSION: Occlusive thrombus in the mid to distal portion of the popliteal
vein. Najeeb, nurse of Dr. Zhivka Skenderi, was informed of these
findings to relay to Dr. Tiger at [DATE] a.m. 05/15/2014.

## 2015-02-13 ENCOUNTER — Ambulatory Visit
Admit: 2015-02-13 | Disposition: A | Discharge status: Home / Self Care | Payer: Self-pay | Attending: Oncology | Admitting: Oncology

## 2015-03-07 NOTE — Consult Note (Signed)
Brief Consult Note: Diagnosis: Diarrhea, nausea and vomiting.  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  Acute renal failure.   Consult note dictated.   Orders entered.   Discussed with Attending MD.   Comments: Patient's presentation discussed with Dr. Candace Cruise.  Patient was seen in consultation by Dr. Candace Cruise.  Recommendations: will reveiw flat and upright of abdomen results.  Ordered for today.  If evidence of ileus is not found recommend proceeding with trial of Imodium one tablet po every four to six hours for management of diarrhea.  Will stop Cipro and flagyl.  No improvement in symptoms.  Additional stool studies ordered: osmolarity, sodium, potassium and chloride.  Dr. Candace Cruise will continue to follow. .  Electronic Signatures: Payton Emerald (NP)  (Signed 18-Jun-15 17:27)  Authored: Brief Consult Note   Last Updated: 18-Jun-15 17:27 by Payton Emerald (NP)

## 2015-03-07 NOTE — Consult Note (Signed)
Chief Complaint:  Subjective/Chief Complaint Pt with post op ileus.Abd distended. Some nausea.   VITAL SIGNS/ANCILLARY NOTES: **Vital Signs.:   22-Jun-15 05:23  Vital Signs Type QID  Temperature Temperature (F) 97.8  Celsius 36.5  Temperature Source oral  Pulse Pulse 74  Respirations Respirations 18  Systolic BP Systolic BP 143  Diastolic BP (mmHg) Diastolic BP (mmHg) 89  Mean BP 107  Pulse Ox % Pulse Ox % 95  Pulse Ox Activity Level  At rest  Oxygen Delivery Room Air/ 21 %   Brief Assessment:  GEN no acute distress   Cardiac Regular   Respiratory normal resp effort   Gastrointestinal Distended with decreased bowel sounds   Lab Results: Routine Chem:  20-Jun-15 05:42   Potassium, Serum 3.8  Glucose, Serum  110  BUN 16  Creatinine (comp)  1.34  Sodium, Serum 143  Chloride, Serum  116  CO2, Serum 22  Calcium (Total), Serum 8.5  Anion Gap  5  Osmolality (calc) 287  eGFR (African American) >60  eGFR (Non-African American)  54 (eGFR values <60mL/min/1.73 m2 may be an indication of chronic kidney disease (CKD). Calculated eGFR is useful in patients with stable renal function. The eGFR calculation will not be reliable in acutely ill patients when serum creatinine is changing rapidly. It is not useful in  patients on dialysis. The eGFR calculation may not be applicable to patients at the low and high extremes of body sizes, pregnant women, and vegetarians.)   Assessment/Plan:  Assessment/Plan:  Assessment S/P SBO, Now with post op ileus.   Plan Hopefully, ileus will gradually resolve. Will see if diarrhea recurs or not. I will be in [place] next two days. Call me back if diarrhea recurs. thanks.   Electronic Signatures: Oh, Paul (MD)  (Signed 22-Jun-15 10:33)  Authored: Chief Complaint, VITAL SIGNS/ANCILLARY NOTES, Brief Assessment, Lab Results, Assessment/Plan   Last Updated: 22-Jun-15 10:33 by Oh, Paul (MD) 

## 2015-03-07 NOTE — Op Note (Signed)
PATIENT NAME:  Stephen [name], Stephen [name] MR#:  [id number] DATE OF BIRTH:  May 18, 1946  DATE OF PROCEDURE:  04/14/2014  SURGEON: Nestor Lewandowsky, MD    ASSISTANT: Bronson Ing, MD    PREOPERATIVE DIAGNOSIS: Left lower lobe mass.  POSTOPERATIVE DIAGNOSIS: Left lower lobe mass (frozen section consistent with metastatic colorectal carcinoma).   OPERATIONS PERFORMED:  1. Preoperative bronchoscopy to assess endobronchial anatomy.  2. Left thoracotomy with left lower lobe wedge resection.   INDICATIONS FOR PROCEDURE: This patient is a 69 year old gentleman who is approximately 3 months out from an abdominoperineal resection for colorectal carcinoma. He was found to have an enlarging left lower lobe mass, which was PET positive and he was offered the above-named procedure for definitive diagnosis and therapy.   DESCRIPTION OF PROCEDURE: The patient was brought to the operating suite and placed in the supine position. General endotracheal anesthesia was given through a double-lumen tube. Preoperative bronchoscopy was carried out. There was no evidence of endobronchial tumor. There were some thick secretions in the right lung, but these were easily suctioned. The tube was positioned appropriately, and then, the patient was turned for a left thoracotomy. All pressure points were carefully padded. The patient was prepped and draped in the usual sterile fashion. A posterolateral 5th interspace thoracotomy was performed. The serratus muscle was spared. The chest was entered. The inferior pulmonary ligament was divided. Careful palpation of the lung revealed a single lesion within the superior segment of the left lower lobe. There were no other lesions palpable in the upper lobe or lower lobe that were of concern. Using multiple firings of an endoscopic stapler, the tumor was resected from the lung. Frozen section revealed approximately a 1 cm margin to the staple line and all margins were clear histologically. The tumor was felt  to be most consistent with metastatic colon or rectal cancer. The lung was then allowed to re-expand and there were no problems with our staple line. Two chest tubes were inserted in standard fashion. Hemostasis was complete. We searched in the subcarinal space, but there were no lymph nodes to be seen or in the AP window. Once the two chest tubes were inserted in standard fashion, they were secured with silk. The ribs were then reapproximated with #2 Vicryl pericostal sutures.  The latissimus muscle was closed with #2 Vicryl, the subcutaneous tissues with 2-0 Vicryl, and the skin with skin clips. The patient was then rolled in a supine position where he was extubated and taken to the recovery room in stable condition.    ____________________________ Lew Dawes Genevive Bi, MD teo:dd D: 04/14/2014 17:05:00 ET T: 04/14/2014 21:41:27 ET JOB#: [id number]  cc: Lew Dawes. Genevive Bi, MD, <Dictator> Louis Matte MD ELECTRONICALLY SIGNED 04/19/2014 19:56

## 2015-03-07 NOTE — Discharge Summary (Signed)
PATIENT NAME:  Stephen [name], Stephen [name] MR#:  [id number] DATE OF BIRTH:  Jun 06, 1946  DATE OF ADMISSION:  04/27/2014 DATE OF DISCHARGE:  05/08/2014  HISTORY OF PRESENT ILLNESS: This 69 year old male was admitted emergently through the Emergency Room. I reviewed that he does have a history of cancer of the rectum and abdominoperineal resection. He did have preoperative chemotherapy and radiation. It was also noted that he recently had a left thoracotomy with resection of a pulmonary metastasis.   He came in with recent watery diarrhea, decreased oral intake, and weakness. He did have x-ray findings consistent with small bowel dilatation and a suspicion of partial small bowel obstruction, but primarily appeared to have gastroenteritis with watery diarrhea.   Details are recorded on the typed H and P.   LABORATORY DATA: His initial white blood count was 18,100. His potassium was low at 2.4.   HOSPITAL COURSE: He was admitted with a diagnosis of probable gastroenteritis, dehydration, and hypokalemia.   He was resuscitated with intravenous fluids and intravenous potassium. His C. difficile study was negative. Comprehensive culture was negative. He continued to have some nausea and also had a number of episodes of vomiting. Serial x-rays were done, which demonstrated persistent evidence of partial small bowel obstruction. It appeared that he may have a combination of gastroenteritis and also partial obstruction. Time was given for resolution; however, he later had large-volume bilious emesis and continued x-ray findings suggestive of high-grade partial small bowel obstruction even after correcting his hypokalemia and resolving his dehydration.   He was carried to the operating room where he had a laparotomy with enterolysis. There were a number of adhesions deep in the pelvis between the pelvic sidewall and small bowel. There was proximal dilatation and distal ileum was normal size. The adhesions were lysed. The  involved intestine was also treated with Seprafilm.   Postoperatively, he was initially kept n.p.o. for a number of days. It appeared that he may likely have some ileus but with time, eventually demonstrated bowel function. He was begun on a liquid diet and advanced to solid food and demonstrated additional bowel movements.   FINAL DIAGNOSES:  1.  Gastroenteritis.  2.  Partial small bowel obstruction.   OPERATION: Laparotomy with enterolysis.   PLAN: Discharge instructions were given. Plan to take a regular diet. Gradually increase activities but avoid straining or heavy lifting. He will plan a followup appointment in the office.    ___________________________ J. Rochel Brome, MD jws:ah D: 05/14/2014 19:12:02 ET T: 05/15/2014 07:59:53 ET JOB#: [id number]  cc: Loreli Dollar, MD, <Dictator> Loreli Dollar MD ELECTRONICALLY SIGNED 05/26/2014 13:01

## 2015-03-07 NOTE — Consult Note (Signed)
PATIENT NAME:  Stephen [name], Stephen [name] MR#:  [id number] DATE OF BIRTH:  [birth year]  DATE OF CONSULTATION:  05/01/2014  REFERRING PHYSICIAN:   CONSULTING PHYSICIAN:  [facility], Ebony Cargo, NP,  Dr. Verdie Shire  ATTENDING:  Dr. Rochel Brome .   REASON FOR CONSULTATION: Diarrhea, nausea or vomiting.   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 He was diagnosed at the time of a colonoscopy by Dr. Arther Dames, which was performed December 10th. That was followed by chemotherapy and radiation, January and February.  On March 26th, he had a colectomy with a colostomy by Dr. Rochel Brome.  Evidence of metastatic disease involving left lung, unfortunately tumor had continued to grow and he is status post left lobectomy which was performed by Dr. Nestor Lewandowsky. Removal with a wedge of the left lower lobe for the indication of metastatic colon cancer. He has not started chemotherapy or radiation. He has actually had a recent PET scan which has revealed 2 areas now involving the right lung. He had lobectomy done on June 1st.  States he was hospitalized until June 7th.  He did have an epidural during that entire timeframe. The patient was discharged home, did well with clear liquids initially, and then advance his diet. After 1 day of advancing diet, he started nausea, vomiting, and throwing up as well. During this time frame,  experiencing constant pain.  He had elaborated further to Dr. Candace Cruise that on June 5th he took laxatives inclusive of Dulcolax and other over-the-counter laxatives to attempt to get himself  "cleaned out."  Then on June 10th, he had intial evidence of positive results. Unfortunately, since this timeframe, he has experienced excessive diarrhea with need to empty his bag every hour. He has had a negative Clostridium difficile toxin A and B study as well as  comprehensive stool since he has been here. He has been started on Cipro and Flagyl which has not seemed to make any difference. The patient did well yesterday with a clear liquid diet. Today, once he ate a pudding this morning, he started to become nauseated more so and then ate a salad at about 1:30 and threw up at about 3:20.  Regurgitated undigested food. He has had no blood. No evidence of melena. Associated abdominal cramping which does seem to resolve once either his bowels move or any ends up throwing up. Nausea and episodes of vomiting seem to occur very soon after eating. If he sustains bowel rest, he does much better.   Patient had 3-way of abdomen inclusive of chest done on June 14th. Findings are compatible with bowel obstruction, likely small bowel, gaseous distention of the colon demonstrates possibility of a colonic obstruction given patient's history of rectal cancer. Flat and upright of abdomen was done 2 days later on the 16th, persistent small bowel dilatation with small bowel wall thickening of the small bowel loops in the left mid abdomen, suggesting edema. More colonic gas is present in the transverse colon than on the previous study. Recommendation is to consider a CT scan with IV contrast. Unfortunately, the patient's renal function has not been optimal during his hospitalization. When he presented, creatinine was 1.84 with an EGFR of 37. Today, creatinine is 1.49 with an EGFR 48. Blood sugars remained in a range between 120 and 154. Hepatic panel on admission: Total protein 8.4 with an AST of 12.  Troponin less than 0.02. CBC: WBC count 18.1. MCV 101 with RDW 16.3, WBC count improved to 13.6, so it remains elevated. Hemoglobin dropped to 13.3 with hematocrit of 40.3. Blood cultures x 2, no growth 48 hours.  Urinalysis: +1 blood, protein is 30 mg/dL.   PAST MEDICAL HISTORY: Colonoscopy, December, findings of colorectal cancer, preoperative neoadjuvant chemotherapy and radiation. Does have  a Port-A-Cath. He had a abdominoperineal resection, March, performed by Dr. Rochel Brome. Findings of invasive adenocarcinoma, which penetrated through the muscularis propria into the perirectal adipose tissue.  All margins were clear. There was no tumor seen in 26 lymph nodes; however, as previously stated a mass left lower lobe of lung which progressed in size, status post a left thoracotomy with removal of wedge of left lower lobe. Hypertension, tobacco abuse which he has since quit, and anemia.   OTHER SIGNIFICANT PAST MEDICAL AND SURGICAL HISTORY: Tonsillectomy and adenoidectomy, and varicose vein.   SOCIAL HISTORY: Remote tobacco use, social alcohol. Prior to surgery none since.   FAMILY HISTORY: No history of colon cancer. Grandmother, uterine cancer. Father with throat cancer. Brother, emphysema passed from pulmonary emboli, and mother had breast cancer diagnosed at the age of 72.   MEDICATIONS AT HOME: Benicar 40 mg a day, Claritin 10 mg a day, acetaminophen with hydrocodone as directed for post thoracotomy pain.   ALLERGIES: None.   REVIEW OF SYSTEMS: All 10 systems reviewed and checked, otherwise unremarkable other than what is stated above.   PHYSICAL EXAMINATION:  VITAL SIGNS: Temperature is 97.5 with a pulse of 106, respirations 20, blood pressure 128/92, and pulse oximetry of 94%.  GENERAL: Well-developed, well-nourished, 69 year old Caucasian gentleman, no acute distress noted. Pleasant. Wife present at bedside.   HEENT: Normocephalic, atraumatic. Pupils equal, reactive to light. Conjunctivae clear. Sclerae anicteric.  NECK: Supple. Trachea midline. No lymphadenopathy or thyromegaly.  PULMONARY: Symmetric rise and fall of chest. Clear to auscultation throughout.  CARDIOVASCULAR: Regular rhythm, S1, S2. No murmurs, no gallops.  ABDOMEN: Slightly distended. Bowel sounds hyperactive.  Pericolostomy present. Left lower quadrant:  A large amount of very liquid dark green appearing  feces. No evidence of blood, tenderness to left lower side of abdomen.  RECTAL: Deferred.  MUSCULOSKELETAL: Moving all 4 extremities. No contractures. No clubbing.  EXTREMITIES: No edema.  PSYCHIATRIC: Alert and oriented x 4. Memory grossly intact. Appropriate affect and mood.  NEUROLOGICAL: No gross neurological deficits.   LABORATORY AND DIAGNOSTIC DATA: Results as noted under history.   IMPRESSION: Diarrhea with associated nausea and vomiting. Imaging studies revealing evidence of concern for obstructive process. Differential inclusive of infectious process, but patient has not been responding to ciprofloxacin as well as Flagyl. Possible secretory in nature, possibly excessive laxative exposure.   PLAN: The patient was seen and evaluated by Dr. Verdie Shire as well. We will await x-ray results which have been ordered today, flat and upright, to continue to evaluate for a possible obstructive process if there is no evidence of ileus being noted. Do recommend proceeding forward with a trial of Lomotil 1 tablet every 4-6 hours as needed. Again, Lomotil only to be started if x-ray does not reveal evidence of ileus. In the meantime, additional stool studies are ordered. Stool for osmolarity, potassium, sodium, and chloride.    ____________________________ Payton Emerald, NP dsh:dd D: 05/01/2014 17:23:42 ET T: 05/01/2014 21:34:19 ET JOB#: [id number]  cc: Payton Emerald, NP, <Dictator> Payton Emerald MD ELECTRONICALLY SIGNED 05/02/2014 17:15

## 2015-03-07 NOTE — H&P (Signed)
PATIENT NAME:  Stephen [name], Stephen [name] MR#:  [id number] DATE OF BIRTH:  [birth year]  DATE OF ADMISSION:  04/27/2014  HISTORY OF PRESENT ILLNESS: This 69 year old male accompanied by his wife and son was evaluated by the Emergency Room staff and Dr. Benjaman Lobe and referred for general surgery evaluation. He reports that since June the 8th, he has developed nausea and vomiting 2 or 3 times per day. He has spit up some bilious material. No blood. Is also having diarrhea which is a watery diarrhea which is foul-smelling and passing a lot of gas. Having just some moderate abdominal discomfort. He says that his legs became weak. He has had no chills or fever. No difficulty breathing. No chest pain but is feeling weaker and weaker and decided to come to the Emergency Room for evaluation. In the course of the evaluation did have an x-ray of the abdomen which I have reviewed which does demonstrate gas in the large and small bowel. His lung fields are mostly clear. There may be some platelike atelectasis on the right side.   PAST MEDICAL HISTORY:  1. He did have colonoscopy findings of cancer of the rectum in December. He had preop neoadjuvant chemotherapy and radiation with a Port-A-Cath. He had an abdominoperineal resection in March of this year. He did have findings of invasive adenocarcinoma which penetrated through the muscularis propria into the perirectal adipose tissue. All margins were clear. There was no tumor seen in 26 lymph nodes. He did, however, later have a finding of a mass of the left lower lobe and Dr. Genevive Bi just recently did thoracotomy with removal of a wedge of left lower lobe which was metastatic colon cancer. The patient reports he is making good progress following that surgery.  2. Does have history of hypertension and takes Benicar. 3. Past history of smoking, but has stopped. 4. History of anemia.   SOCIAL HISTORY:  1. As mentioned has smoked in the past but just recently stopped.  2. Does drink  some alcohol but has not had any since his recent surgery.   FAMILY HISTORY: Negative for colon cancer.   MEDICATIONS: Include:   1. Benicar 40 mg daily.  2. Claritin 10 mg daily.  3. Recent acetaminophen with hydrocodone for his thoracotomy postoperative pain.   ALLERGIES: To no medicines.   REVIEW OF SYSTEMS: He reports feeling somewhat tired and weak but no chills or fever. Limited oral intake during this period of vomiting. He reports he is breathing satisfactorily, having no chest pain other than incisional pain which is making good progress. He reports no recent sores or boils. No ankle edema. Urine output is decreased and somewhat concentrated appearing. Review of systems is otherwise negative.   PHYSICAL EXAMINATION:  GENERAL: He is awake and alert and appears to currently be in no acute distress. Pulse rate 105, respirations 16, blood pressure 125/75, temperature 98.2, pulse oximetry 93%. His weight is 170 pounds. Height is 67 inches.  SKIN: Slightly pale, warm, dry.  HEENT: Pupils equal, reactive to light. Extraocular movements are intact. Sclerae clear. Pharynx clear.  NECK: With no palpable mass. Does have Port-A-Cath in the subclavian area, which is well healed. Does have a left thoracotomy incision, which appears to be healing satisfactorily. Steri-Strips are intact. There are some remaining sutures at his chest tube site and there is no significant swelling or erythema.  ABDOMEN: Soft and nontender. The colostomy is palpated and just mild protuberance of the mucosa. Does have a very foul smelling somewhat yellow-green liquid  stool. No blood was seen. There is no hepatomegaly. His lower abdominal midline incision is well healed. His perineal incision is well-healed.  EXTREMITIES: No dependent edema.  NEUROLOGIC: Awake, alert, and oriented, moving all extremities   LABORATORY DATA: Other clinical data: His CBC with a white blood count of 18,100, hemoglobin 15.3. Liver panel with an  albumin of 3.5. His metabolic anel with a glucose of 142, BUN 29, creatinine 1.84. Sodium is 128, potassium 2.4.   IMPRESSION:  1. Probable gastroenteritis. Possible Clostridium difficile infection.  2. Dehydration.  3. Hypokalemia.   PLAN: Admit him to the hospital for intravenous fluids. Blood cultures were just done. Plan to have his comprehensive stool culture and stool test for Clostridium difficile, start him on intravenous ciprofloxacin and Flagyl. Keeping him n.p.o. except for sips of water  This appears to be  most likely gastroenteritis, likely acquired from the community.  I think this is most likely unrelated to his abdominal perineal resection.  The  Emergency room doctor thought at 1st it may be an obstruction but now appears to be gastroenteritis. .   ____________________________ Lenna Sciara. Rochel Brome, MD jws:lt D: 04/27/2014 22:21:09 ET T: 04/27/2014 23:03:21 ET JOB#: [id number]  cc: Loreli Dollar, MD, <Dictator> Kathlene November. Grayland Ormond, MD Loreli Dollar MD ELECTRONICALLY SIGNED 05/02/2014 19:20

## 2015-03-07 NOTE — Discharge Summary (Signed)
PATIENT NAME:  Stephen [name], Stephen [name] MR#:  [id number] DATE OF BIRTH:  [birth year]  DATE OF ADMISSION:  04/14/2014 DATE OF DISCHARGE:  04/20/2014  ADMITTING DIAGNOSIS: Left lower lobe mass.   DISCHARGE DIAGNOSIS: Metastatic colorectal carcinoma, left lower lobe.   HOSPITAL COURSE: [name] is a 69 year old gentleman who underwent an abdominoperineal resection several months ago and was treated with postoperative chemotherapy. He was identified to have a left lower lobe and right middle lobe mass. The left lower lobe mass had increased in size despite the chemotherapy, and he was taken to the operating room on 04/14/2014, where he underwent a wedge resection of a metastatic adenocarcinoma of the colon in his left lower lobe. There were no other findings intraoperatively, and the patient was managed in the ICU overnight and then taken to the floor, where he was nursed for the next several days. Over the course of several days, his chest tubes were removed. His wounds were healing as expected, and a chest x-ray showed no evidence of pneumothorax or pleural effusion. He did have some nausea and abdominal discomfort, which improved once his epidural was removed and he was transitioned to oral narcotics. At the time of his hospital discharge, he was tolerating a regular diet with healing surgical wounds.   DISCHARGE MEDICATIONS: At the time of discharge, his medications included Claritin, Benicar, acetaminophen/oxycodone and prochlorperazine.   FOLLOWUP: He will follow up with Dr. Grayland Ormond and Dr. Genevive Bi in 1 week.    ____________________________ Lew Dawes. Genevive Bi, MD teo:lb D: 04/30/2014 14:55:36 ET T: 04/30/2014 15:05:01 ET JOB#: [id number]  cc: Christia Reading E. Genevive Bi, MD, <Dictator> Louis Matte MD ELECTRONICALLY SIGNED 05/16/2014 11:56

## 2015-03-07 NOTE — Consult Note (Signed)
Reason for Visit: This 69 year old Male patient presents to the clinic for initial evaluation of  rectal cancer .   Referred by Dr. Grayland Ormond.  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 Patient is left lower lobe lung nodule being evaluated and followed.  Pathology Report pathology report reviewed   Imaging Report PET/CT scan is reviewed   Referral Report clinical notes reviewed   Planned Treatment Regimen neoadjuvant chemoradiation prior to surgical resection   HPI   patient is a pleasant 69 year old male who presented with abdominal bloating diarrhea and bright red blood per rectum. underwent colonoscopy showing a large fungating mass in the distal rectum was several benign polyps.pathology was positive for moderately differentiated adenocarcinoma. PET CT scan was performed showing hypermetabolic rectal mass consistent with primary cancer. The was several local nodal metastasis which were hypermetabolic on the left in the common iliac region suspicious for lymph node involvement. Also had a rather hypermetabolic pulmonary nodule in the left lower lobe differential includes metastatic colorectal disease versus primary bronchogenic carcinoma.patient is been seen by medical oncology is now referred to radiation oncology for opinion. He's not had any recent episodes of bright red blood per rectum. Still has some occasional diarrhea.  Past, Family and Social History:  Past Medical History positive   Cardiovascular hypertension   Past Surgical History llaser surgery for varicose veins   Family History positive   Family History Comments mother with breast cancer throat cancer in his father also family history of hypertension emphysema and ovarian cancer.   Social History positive   Social History Comments 50-pack-year smoking history also has 26 drinks of  EtOH per day   Additional Past Medical and Surgical History accompanied by his wife today   Home Meds:  Home Medications: Medication Instructions Status  Benicar 40 mg oral tablet 0.5  orally once a day Active  Tylenol 500 mg oral tablet 2 tab(s) orally every 6 hours, As Needed - for Pain Active   Review of Systems:  General negative   Performance Status (ECOG) 0   Skin negative   Breast negative   Ophthalmologic negative   ENMT negative   Respiratory and Thorax negative   Cardiovascular negative   Gastrointestinal see HPI   Genitourinary negative   Musculoskeletal negative   Neurological negative   Psychiatric negative   Hematology/Lymphatics negative   Endocrine negative   Allergic/Immunologic negative   Review of Systems   review of systems obtained from nurse's notes  Nursing Notes:  Nursing Vital Signs and Chemo Nursing Nursing Notes: *CC Vital Signs Flowsheet:   12-Jan-15 14:06  Temp Temperature 97.9  Pulse Pulse 68  Respirations Respirations 18  SBP SBP 159  DBP DBP 100  Pain Scale (0-10)  0  Current Weight (kg) (kg) 85.1  Height (cm) centimeters 169  BSA (m2) 1.9    15:08  Temp Temperature 97.9  Pulse Pulse 68  Respirations Respirations 18  SBP SBP 159  DBP DBP 100  Pain Scale (0-10)  0  Current Weight (kg) (kg) 85.1  Height (cm) centimeters 169  BSA (m2) 1.9   Physical Exam:  General/Skin/HEENT:  General normal   Skin normal   Eyes normal   ENMT normal   Head and Neck normal   Additional PE well-developed male in NAD lungs are clear to A&P cardiac examination shows regular rate and rhythm. Abdomen is benign  with no organomegaly or masses noted. On rectal exam rectal sphincter tone is good cannot really palpate the lesion. Prostate appears smooth and contracted without evidence of mass or nodularity.   Breasts/Resp/CV/GI/GU:  Respiratory and Thorax normal   Cardiovascular normal   Gastrointestinal normal    Genitourinary normal   MS/Neuro/Psych/Lymph:  Musculoskeletal normal   Neurological normal   Lymphatics normal   Other Results:  Radiology Results: LabUnknown:    18-Dec-14 15:08, PET/CT Scan Colorectal Cancer Initial Stage  PACS Image   Nuclear Med:  PET/CT Scan Colorectal Cancer Initial Stage   REASON FOR EXAM:    initial stage rectal CA  COMMENTS:       PROCEDURE: PET - PET/CT INIT STAGING COLORECTAL  - Oct 31 2013  3:08PM     CLINICAL DATA:  Initial treatment strategy for colorectal carcinoma.    EXAM:  NUCLEAR MEDICINE PET SKULL BASE TO THIGH    FASTING BLOOD GLUCOSE:  Value: 86mg /dl    TECHNIQUE:  12.6 mCi F-18 FDG was injected intravenously. CT data was obtained  and used for attenuation correction and anatomic localization only.  (This was not acquired as a diagnostic CT examination.) Additional  exam technical data entered on technologist worksheet.    COMPARISON:  None.    FINDINGS:  NECK    No hypermetabolic lymph nodes in the neck.    CHEST    Within the superior segment of the left lower lobe there is 11 mm  nodule in with associated metabolic activity (SUV max 3.3). No  additional pulmonary nodules are present. Within the mediastinum,  there is a borderline enlarged 10 mm right lower paratracheal lymph  node (image 127) with mild metabolic activity ( SUV max = 3.7).    ABDOMEN/PELVIS    No abnormal hypermetabolic activity within the liver. No  hypermetabolic abdominal lymph nodes.    There is intense hypermetabolic activity associated with distal  rectal thickening. This rectal thickening is intensely metabolic  with SUV max 27.2. The metabolic lesion extensive over approximately  6 cm segment of the rectum beginning at the anal verge.    There is a single hypermetabolic mesenteric lymph node regenerating  9 mm (image 315). This is along the left common iliacartery  SKELETON    No focal hypermetabolic activity to suggest skeletal  metastasis.     IMPRESSION:  1. Hypermetabolic rectal mass consists with primary carcinoma.  2. Suspicion of local nodal metastasis with a hypermetabolic left  common iliac lymph node.  3. Rounded hypermetabolic pulmonary nodule in the left lower lobe.  Differential includes metastatic colorectal carcinoma versus primary  bronchogenic carcinoma.  4. Mildly hypermetabolic right lower paratracheal lymph node is  indeterminate.    Electronically Signed    By: Suzy Bouchard M.D.    On: 10/31/2013 15:56         Verified By: Rennis Golden, M.D.,   Relevent Results:   Relevant Scans and Labs PET/CT scan is reviewed.   Assessment and Plan: Impression:   probable clinical stage III adenocarcinoma of the distal rectum in 69 year old male. Patient also has left lower lobe pulmonary nodule which is hypermetabolic is being followed by Dr. Faith Rogue and Dr. Grayland Ormond. Plan:   at this time I discussed the case personally Dr. Grayland Ormond. Would agree on concurrent chemoradiation in a pre-neoadjuvant mode prior to surgical resection. Would plan on delivering 4500 cGy to his pelvis boosting his rectal mass up to 5400 cGy if all small bowel can be excluded  from this field. Risks and benefits of treatment including diarrhea dysuria fatigue alteration blood counts were explained in detail to the patient and his wife. I discussed the left lower lobe lung nodule with the patient he may benefit from surgical resection of that I've also addressed he may be able to have stereotactic body radiation therapy if this does prove to be a mid malignant lesion. I have set the patient up for CT simulation early next week he is having a port placed the end of this week.  I would like to take this opportunity to thank you for allowing me to continue to participate in this patient's care.  CC Referral:  cc: Dr. Clemmie Krill   Electronic Signatures: Baruch Gouty, Roda Shutters (MD)  (Signed 12-Jan-15 15:56)  Authored: HPI, Diagnosis,  PFSH, Home Meds, ROS, Nursing Notes, Physical Exam, Other Results, Relevent Results, Encounter Assessment and Plan, CC Referring Physician   Last Updated: 12-Jan-15 15:56 by Armstead Peaks (MD)

## 2015-03-07 NOTE — Op Note (Signed)
PATIENT NAME:  Stephen [name], ACHOR MR#:  [id number] DATE OF BIRTH:  11/24/1945  DATE OF PROCEDURE:  05/02/2014  PREOPERATIVE DIAGNOSIS: High-grade partial small bowel obstruction, and with associated enteritis.   POSTOPERATIVE DIAGNOSIS: High-grade partial small bowel obstruction, and with associated enteritis.   PROCEDURE: Laparotomy with enterolysis.   SURGEON: Dr. Rochel Brome.   ANESTHESIA: General.   INDICATIONS: This 69 year old male who had rectal cancer and abdominoperineal resection in March was recently admitted with a protracted vomiting and watery diarrhea. He was initially treated with fluid resuscitation and Cipro and Flagyl for what appeared to be gastroenteritis. His stool culture and C. difficile study were both negative. His x-rays demonstrated what may be a partial small bowel obstruction. Initially, time was given for improvement. However, on the day prior to surgery he had did have a large volume bilious emesis and seeing he was not improving, decision was made for surgery.   DESCRIPTION OF PROCEDURE: The patient was placed on the operating table in the supine position under general anesthesia. The colostomy bag was removed from the left mid abdomen. The skin surrounding the stoma was treated with Betadine solution. The remainder of the abdomen was prepared with ChloraPrep. The skin medial to the ostomy was then treated with benzoin and applied Ioban with 1-inch sticky border to isolate the colostomy from the midline. The abdomen was further draped in a sterile manner. A midline incision was made at the site of the old lower abdominal midline scar. Initially, make completely below the umbilicus but was eventually extending around the left of the umbilicus and just above the umbilicus and also extended distally. The incision was carried down through the midline fascia and could identify some of the old remnants of Maxon sutures. The peritoneal cavity was opened. Initial inspection  revealed there was no palpable mass within the liver. There were multiple dilated loops of small bowel. This was followed up to the ligament of Treitz. These loops were approximately 5 cm in diameter. The bowel was then followed distally and in the mid aspect of the ileum, there were adhesions deep within the abdomen near the retroperitoneum. These adhesions were lysed with blunt and sharp dissection. There were also multiple adhesions down deep within the pelvis beyond the hollow of the sacrum. These loops of small bowel were mobilized out of the pelvis with a combination of blunt and sharp dissection. Numerous small bleeding points were cauterized. The bowel was followed also up to the ileocecal valve. The appendix appeared normal. It is noted that the terminal ileum had been normal size and after lysis of all of these adhesions, I could see that intestinal contents could easily be milked into the colon. It appeared that part of the ileum which had been bound up in adhesions was mildly inflamed. There was no evidence of infection. This was treated with 1 application of Seprafilm as the intestinal contents were returned to the normal anatomic position. The omentum was found to be small and would not come down beneath the incision. Next, the fascia was closed with interrupted 0 Maxon figure-of-8 sutures. Skin was closed with clips. Dressings were applied with paper tape. The patient tolerated the procedure satisfactorily and was then prepared for a transfer to the recovery room.    ____________________________ Lenna Sciara. Rochel Brome, MD jws:lt D: 05/02/2014 17:47:04 ET T: 05/02/2014 23:21:33 ET JOB#: [id number]  cc: Loreli Dollar, MD, <Dictator> Loreli Dollar MD ELECTRONICALLY SIGNED 05/06/2014 18:55

## 2015-03-07 NOTE — Discharge Summary (Signed)
PATIENT NAME:  Stephen [name], Stephen [name] MR#:  [id number] DATE OF BIRTH:  1946/04/04  DATE OF ADMISSION:  02/06/2014 DATE OF DISCHARGE:  02/11/2014  HISTORY OF PRESENT ILLNESS: This 69 year old male has recent findings of rectal cancer. He did have neoadjuvant chemotherapy and radiation and came in prepared for surgery.   PAST HISTORY: Does include hypertension. Otherwise, he has generally been in good overall health.   MEDICATIONS: Include Benicar 20 mg daily. Also has recently taken some Percocet for rectal pain.   Details are recorded on the typed H and [place]: The patient did have a bowel preparation at home, was brought in through the outpatient surgery department and carried to the operating room. He did have a preop prophylactic antibiotic and had an abdominoperineal resection. Postoperatively, was treated with analgesics, including Percocet and morphine. He was begun the next day on a clear liquid diet. He did spit up just 1 time during the course of the hospitalization, but subsequently tolerated his diet satisfactorily and was advanced to solid food. He did demonstrate good bowel function. His wounds progressed satisfactorily. He did have a Blake drain for 3 days, which was removed. He did have a Foley catheter which was left in for 3 days intentionally to avoid postoperative urinary retention and was removed and then voided satisfactorily.   Pathology demonstrated invasive adenocarcinoma which did penetrate through the muscularis propria into the perirectal adipose tissue but did not reach serosa. All margins were clear. There was no tumor seen in 26 lymph nodes.   FINAL DIAGNOSIS: Cancer of the rectum.   OPERATION: Abdominoperineal resection with creation of a permanent colostomy.   DISCHARGE INSTRUCTIONS: Arrangements were made for home health care nursing. Also did have some colostomy teaching prior to discharge. Plan is for home health care nurses to continue with colostomy  teaching and care at home.   FOLLOWUP: Plan is for followup in the office and also planned followup oncology evaluation.   ____________________________ Lenna Sciara. Rochel Brome, MD jws:lb D: 02/11/2014 11:34:07 ET T: 02/11/2014 12:10:14 ET JOB#: [id number]  cc: Loreli Dollar, MD, <Dictator> Loreli Dollar MD ELECTRONICALLY SIGNED 02/13/2014 9:25

## 2015-03-07 NOTE — Op Note (Signed)
PATIENT NAME:  Stephen [name], Stephen [name] MR#:  [id number] DATE OF BIRTH:  11-Oct-1946  DATE OF PROCEDURE:  02/06/2014  PREOPERATIVE DIAGNOSIS: Rectal cancer.   POSTOPERATIVE DIAGNOSIS: Rectal cancer.  PROCEDURE: Abdominal perineal resection of the rectum with colostomy.   SURGEON: Rochel Brome, M.D.   ASSISTANT: Dr. Marlyce Huge.   ANESTHESIA: General.   INDICATIONS: This 69 year old male has had colonoscopic findings of cancer of the distal rectum. He has had preoperative chemotherapy and radiation and now brought for definitive surgical procedure.  DESCRIPTION OF THE PROCEDURE: The patient was placed on the operating table in the supine position under general endotracheal anesthesia. The circulating nurse inserted a Foley urinary catheter with Betadine preparation of the penis, draining clear yellow urine. The abdomen was clipped, perineum was clipped. The perineum was prepared with Betadine solution and the abdomen was prepared with ChloraPrep. The abdomen was draped out in a sterile manner.   The patient did have preoperative prophylactic injection of Invanz. A lower abdominal midline incision was made from the umbilicus to the pubic symphysis and carried down through subcutaneous tissues. Numerous small bleeding points were cauterized. The midline fascia was incised and the peritoneal cavity was opened. Initial inspection revealed there was no palpable mass within the liver. The sigmoid colon was identified. There were a few adhesions found in the pelvis. The sigmoid colon was mobilized with dissection of adhesions and incision of the lateral peritoneal reflection. The Harmonic scalpel was used for much of the dissection during the course of the procedure and also during the course of the procedure, a number of small bleeding points were cauterized. There was some thickening of the mesentery of the distal sigmoid colon suggestive of previous diverticulitis. A point in the sigmoid colon was  selected for division. In the distal mid aspect of the sigmoid colon, a window was created in the mesentery and the 75 mm GIA stapler was placed across the bowel, engaged and activated, dividing the colon.   Next, a Harmonic scalpel was used to begin the mesenteric dissection, dissecting from this point towards the sacral promontory and I did ligate the inferior mesenteric artery with 0 chromic and divided with the Harmonic scalpel. The dissection was carried down into the region of the sacral promontory. The ureters were identified bilaterally. Further dissection was carried out making an incision in the peritoneum anterior to the rectum. Next, further dissection was carried out removing the mesentery of the sigmoid colon and rectum. This became a somewhat tedious dissection alternating positions of retractors. I did use the Balfour retractor, Lahey's and a number of malleable retractors. Dissection was carried down identifying the coccyx and also continued bilaterally dissecting through superior hemorrhoidal vessels and dissected between the rectum and the prostate.   Subsequently, I moved to the perineal dissection and made an incision in the drape to expose the anus. The patient was placed in lithotomy position using the bumblebee stirrups. A curvilinear elliptical excision was carried out around the anus oriented from anterior to posterior,  approximately 3 inches in length, carried down through subcutaneous tissues using blunt electrocautery for hemostasis. Next, Harmonic scalpel was used for additional dissection, dissecting around the rectum and I could feel some firmness in the posterior aspect of the rectum at the site of the tumor. Dissection was carried up anterior to the coccyx and I also dissected between the rectum and the prostate and divided the pelvic diaphragm. After a somewhat tedious dissection, I delivered the rectum and distal sigmoid colon out through the  perineal wound and submitted  it in for formalin for routine pathology. The wound was inspected and hemostasis was intact. The pelvic diaphragm was closed with interrupted 0 chromic simple sutures. A 19-French Blake drain was inserted up through the pelvic diaphragm into the pelvis and brought out through a separate stab wound in the left buttock and secured to the skin with 2-0 nylon suture. Next, the skin incision was closed with a combination of interrupted 2-0 and 3-0 nylon vertical mattress sutures.   Following this, gowns, gloves, instruments and towels were exchanged for new ones. Next, the pelvis was inspected and small amount of blood was aspirated, but it appeared that hemostasis was intact. The peritoneum of the pelvic floor was closed with a running 3-0 chromic stitch. Next, lap packs were removed. Lap pack count was correct.   A circular incision was made in the left abdomen midway between the umbilicus and the anterior/superior iliac spine. A circular portion of skin was excised. Incision was carried down to the anterior rectus sheath where a cruciate incision was made, exposing the rectus muscles, which were spread to expose the posterior sheath and another cruciate incision was made. This was dilated large enough to admit 2 fingers and using Babcock clamps delivered the colon and pulled it out approximately 1.5 cm beyond the skin held in place with Allis clamps. The bowel was inspected. It was not twisted. Next, the omentum was brought beneath the wound. All lap packs were out and the peritoneum was closed with running 3-0 chromic, the midline fascia was closed with interrupted 0 Maxon figure-of-eight sutures and the skin was closed with clips. This area was cleaned and dressings were applied.   Next, the colostomy was matured by excising the staple line with electrocautery and used 5-0 Dexon suturing the seromuscular part of the colon and the full thickness of the cut edge to the skin. Xylocaine jelly was used and  introduced a finger demonstrating satisfactory patency. Next, the skin was cleaned with saline, dried and applied benzoin and cut the wafer to fit and attached to the wafer in the bag and did secure the gauze to the skin with 2 inch paper tape. The perineal wound was cleaned with saline. The Blake drain was activated  and had scant serosanguineous drainage. Dressings were applied with paper tape and mesh pants and we did leave the Foley catheter intact for drainage of the bladder. The patient appeared to tolerate the procedure satisfactorily and was carried to the recovery room for postoperative care.  ____________________________ J. Rochel Brome, MD jws:aw D: 02/06/2014 13:19:40 ET T: 02/06/2014 13:50:56 ET JOB#: [id number]  cc: Loreli Dollar, MD, <Dictator> Loreli Dollar MD ELECTRONICALLY SIGNED 02/07/2014 18:40

## 2015-03-07 NOTE — Consult Note (Signed)
Pt seen and examined. Please see Stephen [name]'s notes. Pt with metastatic rectal cancer. S/O chemo and XRT in Jan and Feb. Had wedge lung resection recently, followed by epidural for pain management. No BM x 7 days or so. Took multiple laxatives without relief initially.( last dose 6/5). Started watery BM via colostomy. Stool studies neg. No other meds other than flagyl/cipro. Would stop cipro/flagyl. Agree with rechecking abd films today. Send stool to distinguish osmotic vs secretory diarrhea. If x- ray does not show ileus, ok to start lomotil prn tomorrow. Will follow. THanks.  Electronic Signatures: Verdie Shire (MD)  (Signed on 18-Jun-15 17:20)  Authored  Last Updated: 18-Jun-15 17:20 by Verdie Shire (MD)

## 2015-03-07 NOTE — Consult Note (Signed)
Chief Complaint:  Subjective/Chief Complaint seen for abdominal apin and sbo.  progressing slowly after enterolysis.  minimal nausea no emesis, continues with distension and intermittant abdominal pain. no flatus today.   VITAL SIGNS/ANCILLARY NOTES: **Vital Signs.:   21-Jun-15 06:16  Vital Signs Type QID  Temperature Temperature (F) 97.6  Celsius 36.4  Temperature Source oral  Pulse Pulse 76  Respirations Respirations 18  Systolic BP Systolic BP 655  Diastolic BP (mmHg) Diastolic BP (mmHg) 88  Mean BP 108  Pulse Ox % Pulse Ox % 96  Pulse Ox Activity Level  At rest  Oxygen Delivery Room Air/ 21 %   Brief Assessment:  Cardiac Regular   Respiratory clear BS   Gastrointestinal details normal moderate distension, bowel sounds positive distant, mild diffuse tenderness to palpation.   Lab Results: Routine Chem:  21-Jun-15 05:09   Glucose, Serum  100  BUN 12  Creatinine (comp) 1.14  Sodium, Serum 142  Potassium, Serum  3.3  Chloride, Serum  109  CO2, Serum 25  Calcium (Total), Serum  8.1  Anion Gap 8  Osmolality (calc) 283  eGFR (African American) >60  eGFR (Non-African American) >60 (eGFR values <58mL/min/1.73 m2 may be an indication of chronic kidney disease (CKD). Calculated eGFR is useful in patients with stable renal function. The eGFR calculation will not be reliable in acutely ill patients when serum creatinine is changing rapidly. It is not useful in  patients on dialysis. The eGFR calculation may not be applicable to patients at the low and high extremes of body sizes, pregnant women, and vegetarians.)   Assessment/Plan:  Assessment/Plan:  Assessment 1) sbo, s/p enterolysis 2) s/p partial colectomy for colon cancer and left thoracotomy   Plan 1) continue current, if no improvement may consider abd films tomorrow.  Dr Candace Cruise to be back tomorrow.   Electronic Signatures: Loistine Simas (MD)  (Signed 21-Jun-15 14:04)  Authored: Chief Complaint, VITAL  SIGNS/ANCILLARY NOTES, Brief Assessment, Lab Results, Assessment/Plan   Last Updated: 21-Jun-15 14:04 by Loistine Simas (MD)

## 2015-03-07 NOTE — Consult Note (Signed)
Chief Complaint:  Subjective/Chief Complaint Still with significant output via colostomy. Yet, abd series show SBO. To go to surgery today.   VITAL SIGNS/ANCILLARY NOTES: **Vital Signs.:   19-Jun-15 04:46  Vital Signs Type QID  Temperature Temperature (F) 97.9  Celsius 36.6  Temperature Source oral  Pulse Pulse 79  Respirations Respirations 18  Systolic BP Systolic BP 800  Diastolic BP (mmHg) Diastolic BP (mmHg) 82  Mean BP 101  Pulse Ox % Pulse Ox % 97  Pulse Ox Activity Level  At rest  Oxygen Delivery Room Air/ 21 %   Brief Assessment:  GEN no acute distress   Cardiac Regular   Respiratory clear BS   Gastrointestinal liquid stool per ostomy   Lab Results: Routine Coag:  18-Jun-15 19:40   Prothrombin  19.5  INR 1.7 (INR reference interval applies to patients on anticoagulant therapy. A single INR therapeutic range for coumarins is not optimal for all indications; however, the suggested range for most indications is 2.0 - 3.0. Exceptions to the INR Reference Range may include: Prosthetic heart valves, acute myocardial infarction, prevention of myocardial infarction, and combinations of aspirin and anticoagulant. The need for a higher or lower target INR must be assessed individually. Reference: The Pharmacology and Management of the Vitamin K  antagonists: the seventh ACCP Conference on Antithrombotic and Thrombolytic Therapy. LKJZP.9150 Sept:126 (3suppl): [id number]. A HCT value >55% may artifactually increase the PT.  In one study,  the increase was an average of 25%. Reference:  "Effect on Routine and Special Coagulation Testing Values of Citrate Anticoagulant Adjustment in Patients with High HCT Values." American Journal of Clinical Pathology 2006;126:400-405.)   Assessment/Plan:  Assessment/Plan:  Assessment Diarrhea, nausea,vomiting, pSBO?   Plan To go to surgery. Will await outcome and see if diarrhea improves. See yesterday's recommendations. Dr.  Gustavo Lah will see patient over the weekend.   Electronic Signatures: Verdie Shire (MD)  (Signed 19-Jun-15 11:28)  Authored: Chief Complaint, VITAL SIGNS/ANCILLARY NOTES, Brief Assessment, Lab Results, Assessment/Plan   Last Updated: 19-Jun-15 11:28 by Verdie Shire (MD)

## 2015-03-07 NOTE — Op Note (Signed)
PATIENT NAME:  Stephen [name], Stephen [name] MR#:  [id number] DATE OF BIRTH:  Feb 02, 1946  DATE OF PROCEDURE:  11/28/2013  PREOPERATIVE DIAGNOSIS: Rectal cancer.   POSTOPERATIVE DIAGNOSIS: Rectal cancer.   PROCEDURE PERFORMED: Insertion of central venous catheter with subcutaneous infusion port.   SURGEON: Rochel Brome, M.D.   ANESTHESIA: Local with monitored anesthesia care.   INDICATIONS: This 69 year old male recently had findings of rectal cancer, now needing central venous access for chemotherapy.   DESCRIPTION OF PROCEDURE: The patient was placed on the operating table in the supine position and monitored by the anesthesia staff and sedated. A rolled sheet was placed behind the shoulder blades. The head was placed on a doughnut ring so that the neck was extended. The head was turned 20 degrees to the left. The neck and right subclavian areas were prepared with ChloraPrep and draped in a sterile manner.   The skin beneath the clavicle was infiltrated with 1% Xylocaine. A transversely oriented 3 cm incision was made and carried down through subcutaneous tissues. A subcutaneous pouch was created just anterior to the deep fascia, large enough to admit the [place] port. It is noted that a number of small bleeding points were cauterized. Hemostasis was subsequently intact. Next, ultrasound was used to image the jugular vein and carotid artery. Also, the thyroid was observed. The anatomy appeared typical. Next, the skin overlying the jugular vein was infiltrated with 1% Xylocaine. A transversely oriented 5 mm incision was made and carried down through subcutaneous tissues. Next, with the patient in the Trendelenburg position, the vein was cannulated with a needle using ultrasound guidance. An image was saved for the paper chart. The guidewire was advanced through the needle down into the vena cava. The needle was withdrawn. Next, fluoroscopy was used to demonstrate the position of the guidewire in the vena cava. The  dilator and introducer sheath were advanced over the guidewire. The guidewire was removed.   Next, the dilator was removed and the catheter was advanced down through the sheath. The sheath was peeled away. The tip was positioned in the vena cava as seen with fluoroscopy, 12 cm from the cervical incision. A fluoroscopic image was saved for the paper chart. Next, the catheter was tunneled to the subclavian incision. Pressure was held over the tunnel site. The catheter was cut to fit and was attached to the [place] port using the accompanying sleeve and was cannulated with a Huber needle, aspirated a trace of blood and flushed with 10 mL of heparinized saline solution. Next, the incision was further noted and hemostasis was intact. The port was placed into the subcutaneous pouch and sutured to the deep fascia with 4-0 silk. Next, the pouch was closed with interrupted 5-0 Vicryl. Both skin incisions were closed with 5-0 Vicryl subcuticular suture and Dermabond. The patient tolerated surgery satisfactorily and was then prepared for transfer to the recovery room.   ____________________________ Lenna Sciara. Rochel Brome, MD jws:aw D: 11/28/2013 10:16:56 ET T: 11/28/2013 10:35:28 ET JOB#: [id number]  cc: Loreli Dollar, MD, <Dictator> Loreli Dollar MD ELECTRONICALLY SIGNED 11/29/2013 19:51

## 2015-03-07 NOTE — Consult Note (Signed)
Chief Complaint:  Subjective/Chief Complaint patient seen for sbo.  had enterolysis yesterday.  doing well, abd pain improving, no nausea, passing some flatus.   VITAL SIGNS/ANCILLARY NOTES: **Vital Signs.:   20-Jun-15 12:56  Vital Signs Type QID  Temperature Temperature (F) 98  Temperature Source oral  Pulse Pulse 74  Respirations Respirations 18  Systolic BP Systolic BP 960  Diastolic BP (mmHg) Diastolic BP (mmHg) 78  Mean BP 97  Pulse Ox % Pulse Ox % 94  Pulse Ox Activity Level  At rest  Oxygen Delivery Room Air/ 21 %   Brief Assessment:  Cardiac Regular   Respiratory clear BS   Gastrointestinal details normal positive bowel sounds, mild distension, appropriately tender.   Lab Results: Routine Chem:  20-Jun-15 05:42   Glucose, Serum  110  BUN 16  Creatinine (comp)  1.34  Sodium, Serum 143  Potassium, Serum 3.8  Chloride, Serum  116  CO2, Serum 22  Calcium (Total), Serum 8.5  Anion Gap  5  Osmolality (calc) 287  eGFR (African American) >60  eGFR (Non-African American)  54 (eGFR values <88m/min/1.73 m2 may be an indication of chronic kidney disease (CKD). Calculated eGFR is useful in patients with stable renal function. The eGFR calculation will not be reliable in acutely ill patients when serum creatinine is changing rapidly. It is not useful in  patients on dialysis. The eGFR calculation may not be applicable to patients at the low and high extremes of body sizes, pregnant women, and vegetarians.)   Radiology Results: XRay:    16-Jun-15 12:20, Abdomen Flat and Erect  Abdomen Flat and Erect   REASON FOR EXAM:    abdominal pain and vomiting  COMMENTS:       PROCEDURE: DXR - DXR ABDOMEN 2 V FLAT AND ERECT  - Apr 29 2014 12:20PM     CLINICAL DATA:  Abdominal pain and vomiting    EXAM:  ABDOMEN - 2 VIEW    COMPARISON:  04/27/2014    FINDINGS:  Air-filled distended loops of small bowel in the mid abdomen with  scattered air-fluid levels on upright  view.  Gas is now present within the transverse colon though the degree of  small bowel distention remains out of proportion to amount of  colonic gas.    Mild bowel wall thickening of small bowel loops in LEFT mid abdomen.    No free intraperitoneal air.    Bones demineralized.    Numerous pelvic phleboliths without definite urinary tract  calcification.     IMPRESSION:  Persistent small bowel dilatation with mild bowel wall thickening of  small bowel loops in the LEFT mid abdomen suggesting edema.    More colonic gas is present in the transverse colon than on the  previous study though the findings remain most consistent with small  bowel obstruction.    Consider CT assessment IV and oral contrast to evaluate observed  small bowel wall thickening.      Electronically Signed    By: MLavonia DanaM.D.    On: 04/29/2014 12:35       Verified By: MBurnetta Sabin M.D.,    18-Jun-15 18:08, Abdomen Flat and Erect  Abdomen Flat and Erect   REASON FOR EXAM:    vomiting  COMMENTS:       PROCEDURE: DXR - DXR ABDOMEN 2 V FLAT AND ERECT  - May 01 2014  6:08PM     CLINICAL DATA:  Abdominal pain.  Vomiting.    EXAM:  ABDOMEN - 2 VIEW    COMPARISON:  Abdominal radiograph 04/29/2014.    FINDINGS:  Numerous dilated loops of small bowel are again noted measuring up  to 5 cm in the central abdomen. Multiple air-fluid levels scattered  throughout the small bowel on the upright projection. There is a  paucity of colonic gas and stool noted. No frank pneumoperitoneum.  Numerous pelvic phleboliths are noted.     IMPRESSION:  1. Findings remain compatible with small bowel obstruction.  2. No pneumoperitoneum.      Electronically Signed    By: Vinnie Langton M.D.    On: 05/01/2014 18:18         Verified By: Etheleen Mayhew, M.D.,   Assessment/Plan:  Assessment/Plan:  Assessment 1) sbo-h/o metastatic rectal cancer.  s/p enterolysis.  doing well.   Plan 1) will follow at  a distance, no new GI recs.   Electronic Signatures: Loistine Simas (MD)  (Signed 20-Jun-15 16:28)  Authored: Chief Complaint, VITAL SIGNS/ANCILLARY NOTES, Brief Assessment, Lab Results, Radiology Results, Assessment/Plan   Last Updated: 20-Jun-15 16:28 by Loistine Simas (MD)

## 2015-04-21 ENCOUNTER — Inpatient Hospital Stay: Payer: Medicare HMO | Attending: Oncology

## 2015-04-21 DIAGNOSIS — Z933 Colostomy status: Secondary | ICD-10-CM | POA: Insufficient documentation

## 2015-04-21 DIAGNOSIS — Z85048 Personal history of other malignant neoplasm of rectum, rectosigmoid junction, and anus: Secondary | ICD-10-CM | POA: Diagnosis present

## 2015-04-21 DIAGNOSIS — I10 Essential (primary) hypertension: Secondary | ICD-10-CM | POA: Insufficient documentation

## 2015-04-21 DIAGNOSIS — D6959 Other secondary thrombocytopenia: Secondary | ICD-10-CM | POA: Insufficient documentation

## 2015-04-21 DIAGNOSIS — Z800 Family history of malignant neoplasm of digestive organs: Secondary | ICD-10-CM | POA: Diagnosis not present

## 2015-04-21 DIAGNOSIS — G629 Polyneuropathy, unspecified: Secondary | ICD-10-CM | POA: Diagnosis not present

## 2015-04-21 DIAGNOSIS — D649 Anemia, unspecified: Secondary | ICD-10-CM | POA: Insufficient documentation

## 2015-04-21 DIAGNOSIS — Z87891 Personal history of nicotine dependence: Secondary | ICD-10-CM | POA: Diagnosis not present

## 2015-04-21 DIAGNOSIS — C801 Malignant (primary) neoplasm, unspecified: Secondary | ICD-10-CM

## 2015-04-21 DIAGNOSIS — Z452 Encounter for adjustment and management of vascular access device: Secondary | ICD-10-CM | POA: Insufficient documentation

## 2015-04-21 MED ORDER — HEPARIN SOD (PORK) LOCK FLUSH 100 UNIT/ML IV SOLN
500.0000 [IU] | Freq: Once | INTRAVENOUS | Status: AC
  Administered 2015-04-21: 500 [IU] via INTRAVENOUS

## 2015-04-21 MED ORDER — SODIUM CHLORIDE 0.9 % IJ SOLN
10.0000 mL | INTRAMUSCULAR | Status: DC | PRN
  Administered 2015-04-21: 10 mL
  Filled 2015-04-21: qty 10

## 2015-04-21 MED ORDER — HEPARIN SOD (PORK) LOCK FLUSH 100 UNIT/ML IV SOLN
INTRAVENOUS | Status: AC
  Filled 2015-04-21: qty 5

## 2015-04-28 ENCOUNTER — Other Ambulatory Visit: Payer: Self-pay | Admitting: *Deleted

## 2015-04-28 DIAGNOSIS — C20 Malignant neoplasm of rectum: Secondary | ICD-10-CM

## 2015-04-29 ENCOUNTER — Inpatient Hospital Stay (HOSPITAL_BASED_OUTPATIENT_CLINIC_OR_DEPARTMENT_OTHER): Payer: Medicare HMO | Admitting: Oncology

## 2015-04-29 ENCOUNTER — Inpatient Hospital Stay: Payer: Medicare HMO

## 2015-04-29 VITALS — BP 162/90 | HR 54 | Temp 95.7°F | Resp 18 | Wt 180.6 lb

## 2015-04-29 DIAGNOSIS — D649 Anemia, unspecified: Secondary | ICD-10-CM

## 2015-04-29 DIAGNOSIS — Z800 Family history of malignant neoplasm of digestive organs: Secondary | ICD-10-CM

## 2015-04-29 DIAGNOSIS — D6959 Other secondary thrombocytopenia: Secondary | ICD-10-CM

## 2015-04-29 DIAGNOSIS — Z85048 Personal history of other malignant neoplasm of rectum, rectosigmoid junction, and anus: Secondary | ICD-10-CM | POA: Diagnosis not present

## 2015-04-29 DIAGNOSIS — G629 Polyneuropathy, unspecified: Secondary | ICD-10-CM

## 2015-04-29 DIAGNOSIS — Z933 Colostomy status: Secondary | ICD-10-CM

## 2015-04-29 DIAGNOSIS — I10 Essential (primary) hypertension: Secondary | ICD-10-CM

## 2015-04-29 DIAGNOSIS — C7800 Secondary malignant neoplasm of unspecified lung: Principal | ICD-10-CM

## 2015-04-29 DIAGNOSIS — Z87891 Personal history of nicotine dependence: Secondary | ICD-10-CM

## 2015-04-29 DIAGNOSIS — Z452 Encounter for adjustment and management of vascular access device: Secondary | ICD-10-CM

## 2015-04-29 DIAGNOSIS — C20 Malignant neoplasm of rectum: Secondary | ICD-10-CM

## 2015-04-29 LAB — CBC WITH DIFFERENTIAL/PLATELET
BASOS ABS: 0.1 10*3/uL (ref 0–0.1)
BASOS PCT: 1 %
EOS ABS: 0.3 10*3/uL (ref 0–0.7)
Eosinophils Relative: 6 %
HEMATOCRIT: 43 % (ref 40.0–52.0)
Hemoglobin: 14.3 g/dL (ref 13.0–18.0)
Lymphocytes Relative: 16 %
Lymphs Abs: 0.9 10*3/uL — ABNORMAL LOW (ref 1.0–3.6)
MCH: 33.9 pg (ref 26.0–34.0)
MCHC: 33.4 g/dL (ref 32.0–36.0)
MCV: 101.7 fL — ABNORMAL HIGH (ref 80.0–100.0)
MONO ABS: 0.6 10*3/uL (ref 0.2–1.0)
Monocytes Relative: 10 %
NEUTROS ABS: 3.7 10*3/uL (ref 1.4–6.5)
Neutrophils Relative %: 67 %
Platelets: 122 10*3/uL — ABNORMAL LOW (ref 150–440)
RBC: 4.22 MIL/uL — ABNORMAL LOW (ref 4.40–5.90)
RDW: 16.8 % — AB (ref 11.5–14.5)
WBC: 5.6 10*3/uL (ref 3.8–10.6)

## 2015-04-29 LAB — COMPREHENSIVE METABOLIC PANEL
ALBUMIN: 3.9 g/dL (ref 3.5–5.0)
ALT: 18 U/L (ref 17–63)
ANION GAP: 5 (ref 5–15)
AST: 29 U/L (ref 15–41)
Alkaline Phosphatase: 55 U/L (ref 38–126)
BILIRUBIN TOTAL: 0.7 mg/dL (ref 0.3–1.2)
BUN: 24 mg/dL — ABNORMAL HIGH (ref 6–20)
CALCIUM: 8.5 mg/dL — AB (ref 8.9–10.3)
CHLORIDE: 103 mmol/L (ref 101–111)
CO2: 24 mmol/L (ref 22–32)
CREATININE: 1.37 mg/dL — AB (ref 0.61–1.24)
GFR calc Af Amer: 60 mL/min — ABNORMAL LOW (ref >60)
GFR calc non Af Amer: 51 mL/min — ABNORMAL LOW (ref >60)
Glucose, Bld: 120 mg/dL — ABNORMAL HIGH (ref 65–99)
Potassium: 4.5 mmol/L (ref 3.5–5.1)
Sodium: 132 mmol/L — ABNORMAL LOW (ref 135–145)
TOTAL PROTEIN: 7.1 g/dL (ref 6.5–8.1)

## 2015-04-30 LAB — CEA: CEA: 2.5 ng/mL (ref 0.0–4.7)

## 2015-05-18 NOTE — Progress Notes (Signed)
[place]  Telephone:(336) [phone] Fax:(336) [phone]  ID: Toniann Fail OB: [birth year]  MR#: [id number]  RXV#:[id number]  Patient Care Team: [name], MD as PCP - General (Family Medicine)  CHIEF COMPLAINT:  Chief Complaint  Patient presents with  . Follow-up    rectal ca    INTERVAL HISTORY: Patient returns to clinic today for routine six-month evaluation. His peripheral neuropathy is still evident, but continues to improve. He otherwise feels well.  He does not complain of any further bleeding. He has no other neurologic complaints. He denies any chest pain or shortness of breath. He has a good appetite and denies weight loss. He denies any recent fevers. He denies any nausea, vomiting, constipation, or diarrhea. He has no melena or hematochezia. 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 Patient offers no further specific complaints today.  REVIEW OF SYSTEMS:   Review of Systems  Constitutional: Negative.   Respiratory: Negative.   Cardiovascular: Negative.   Gastrointestinal: Negative.   Neurological: Positive for sensory change.    As per HPI. Otherwise, a complete review of systems is negatve.  PAST MEDICAL HISTORY: Past Medical History  Diagnosis Date  . History of anemia   . History of chicken pox   . HTN (hypertension)   . Smoker   . Allergic rhinitis     PAST SURGICAL HISTORY: Past Surgical History  Procedure Laterality Date  . Varicose vein surgery  2010  . Tonsillectomy and adenoidectomy  Childhood  . Eus N/A 11/15/2013    Procedure: LOWER ENDOSCOPIC ULTRASOUND (EUS);  Surgeon: Milus Banister, MD;  Location: [facility];  Service: Endoscopy;  Laterality: N/A;    FAMILY HISTORY Family History  Problem Relation Age of Onset  . Hypertension Mother   . Coronary artery disease Mother     angina  . Cancer Mother     breast  . Cancer Father 63    esophageal  . COPD Brother     emphysema  . Cancer Paternal Grandmother     colon  .  Diabetes Neg Hx        ADVANCED DIRECTIVES:    HEALTH MAINTENANCE: History  Substance Use Topics  . Smoking status: Current Every Day Smoker -- 0.80 packs/day    Types: Cigarettes  . Smokeless tobacco: Never Used  . Alcohol Use: Yes     Comment: Regular 2 scotch/day     Colonoscopy:  PAP:  Bone density:  Lipid panel:  No Known Allergies  Current Outpatient Prescriptions  Medication Sig Dispense Refill  . loratadine (CLARITIN) 10 MG tablet Take 10 mg by mouth daily as needed.    Marland Kitchen olmesartan (BENICAR) 20 MG tablet Take 20 mg by mouth daily.     No current facility-administered medications for this visit.    OBJECTIVE: Filed Vitals:   04/29/15 1207  BP: 162/90  Pulse: 54  Temp: 95.7 F (35.4 C)  Resp: 18     Body mass index is 28.27 kg/(m^2).    ECOG FS:0 - Asymptomatic  General: Well-developed, well-nourished, no acute distress. Eyes: anicteric sclera. Lungs: Clear to auscultation bilaterally. Heart: Regular rate and rhythm. No rubs, murmurs, or gallops. Abdomen: Soft, nontender, nondistended. No organomegaly noted, normoactive bowel sounds. Colostomy noted. Musculoskeletal: No edema, cyanosis, or clubbing. Neuro: Alert, answering all questions appropriately. Cranial nerves grossly intact. Skin: No rashes or petechiae noted. Psych: Normal affect.   LAB RESULTS:  Lab Results  Component Value Date   NA 132* 04/29/2015   K 4.5 04/29/2015  CL 103 04/29/2015   CO2 24 04/29/2015   GLUCOSE 120* 04/29/2015   BUN 24* 04/29/2015   CREATININE 1.37* 04/29/2015   CALCIUM 8.5* 04/29/2015   PROT 7.1 04/29/2015   ALBUMIN 3.9 04/29/2015   AST 29 04/29/2015   ALT 18 04/29/2015   ALKPHOS 55 04/29/2015   BILITOT 0.7 04/29/2015   GFRNONAA 51* 04/29/2015   GFRAA 60* 04/29/2015    Lab Results  Component Value Date   WBC 5.6 04/29/2015   NEUTROABS 3.7 04/29/2015   HGB 14.3 04/29/2015   HCT 43.0 04/29/2015   MCV 101.7* 04/29/2015   PLT 122* 04/29/2015      STUDIES: No results found.  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   PLAN:    1. Rectal cancer: K-ras mutation is positive, therefore cetuximab would offer no benefit. PET scan results from January 26, 2015 were independently and reported no evidence of recurrence. No further intervention is needed at this time. Return to clinic in 3 months with repeat laboratory work and further evaluation. Will reimage with CT scan prior to next appointment.  Patient completed his treatment on December 09, 2014.  2. Left lower extremity DVT: Patient has now discontinued Xarelto. 3. 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 Monitor.  4. Neutropenia: Resolved.   5. 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 Monitor.   Patient expressed understanding and was in agreement with this plan. He also understands that He can call clinic at any time with any questions, concerns, or complaints.   No matching staging information was found for the patient.  Lloyd Huger, MD   05/18/2015 8:37 AM

## 2015-06-02 ENCOUNTER — Inpatient Hospital Stay: Payer: Medicare HMO | Attending: Oncology

## 2015-06-02 DIAGNOSIS — C20 Malignant neoplasm of rectum: Secondary | ICD-10-CM | POA: Diagnosis present

## 2015-06-02 DIAGNOSIS — Z452 Encounter for adjustment and management of vascular access device: Secondary | ICD-10-CM | POA: Insufficient documentation

## 2015-06-02 DIAGNOSIS — C801 Malignant (primary) neoplasm, unspecified: Secondary | ICD-10-CM

## 2015-06-02 MED ORDER — SODIUM CHLORIDE 0.9 % IJ SOLN
10.0000 mL | INTRAMUSCULAR | Status: DC | PRN
  Administered 2015-06-02: 10 mL via INTRAVENOUS
  Filled 2015-06-02: qty 10

## 2015-06-02 MED ORDER — HEPARIN SOD (PORK) LOCK FLUSH 100 UNIT/ML IV SOLN
500.0000 [IU] | Freq: Once | INTRAVENOUS | Status: AC
  Administered 2015-06-02: 500 [IU] via INTRAVENOUS
  Filled 2015-06-02: qty 5

## 2015-06-15 ENCOUNTER — Encounter: Payer: Self-pay | Admitting: Radiation Oncology

## 2015-06-15 ENCOUNTER — Ambulatory Visit
Admission: RE | Admit: 2015-06-15 | Discharge: 2015-06-15 | Disposition: A | Discharge status: Home / Self Care | Payer: Medicare HMO | Source: Ambulatory Visit | Attending: Radiation Oncology | Admitting: Radiation Oncology

## 2015-06-15 VITALS — BP 126/77 | HR 76 | Temp 97.1°F | Resp 20 | Wt 181.9 lb

## 2015-06-15 DIAGNOSIS — C20 Malignant neoplasm of rectum: Secondary | ICD-10-CM

## 2015-06-15 NOTE — Progress Notes (Signed)
Radiation Oncology Follow up Note  Name: Stephen [name]   Date:   06/15/2015 MRN:  [id number] DOB: Jun 11, 1946    This 69 y.o. male presents to the clinic today for follow-up for rectal cancer stage IIIa (T2 N1 M0).  REFERRING PROVIDER: [name], MD  HPI: patient is a 69 year old male now out approximately 18 months having completed neurologic chemoradiation for stage IIIa rectal cancer..he had new adjuvant chemotherapy and radiation and a hat underwent abdominal perineal resection which showed invasive adenocarcinoma measuring through the muscularis propria into perirectal adipose tissue although all margins were clear and no tumor seen in 26 lymph nodes examined. He had a wedge resection of his left lower lobe which was metastatic colon cancer. Since then he is done well. He does have some peripheral neuropathy otherwise bowel function he states is slightly tense towards watery diarrhea.patient is also had left lower extremity DVT and thrombus cytopenia secondary to chemotherapy. He is seen today in routine follow-up.  COMPLICATIONS OF TREATMENT: none  FOLLOW UP COMPLIANCE: keeps appointments   PHYSICAL EXAM:  BP 126/77 mmHg  Pulse 76  Temp(Src) 97.1 F (36.2 C)  Resp 20  Wt 181 lb 14.1 oz (82.5 kg) Well-developed well-nourished patient in NAD. HEENT reveals PERLA, EOMI, discs not visualized.  Oral cavity is clear. No oral mucosal lesions are identified. Neck is clear without evidence of cervical or supraclavicular adenopathy. Lungs are clear to A&P. Cardiac examination is essentially unremarkable with regular rate and rhythm without murmur rub or thrill. Abdomen is benign with no organomegaly or masses noted. Motor sensory and DTR levels are equal and symmetric in the upper and lower extremities. Cranial nerves II through XII are grossly intact. Proprioception is intact. No peripheral adenopathy or edema is identified. No motor or sensory levels are noted. Crude visual fields are  within normal range.   RADIOLOGY RESULTS: next CT scan is scheduled for September 2016  PLAN: present time he is doing well. He is being monitored by medical oncology for his stage IV rectal cancer with known lung metastasis. Overall he is doing well. I have asked to see him back in 1 year for follow-up. Certainly would leave reevaluate him at any time should further treatment be indicated.  I would like to take this opportunity for allowing me to participate in the care of your patient.Armstead Peaks., MD

## 2015-07-30 ENCOUNTER — Ambulatory Visit
Admission: RE | Admit: 2015-07-30 | Discharge: 2015-07-30 | Disposition: A | Discharge status: Home / Self Care | Payer: Medicare HMO | Source: Ambulatory Visit | Attending: Oncology | Admitting: Oncology

## 2015-07-30 ENCOUNTER — Other Ambulatory Visit: Payer: Medicare HMO

## 2015-07-30 ENCOUNTER — Inpatient Hospital Stay: Payer: Medicare HMO

## 2015-07-30 ENCOUNTER — Inpatient Hospital Stay: Payer: Medicare HMO | Attending: Oncology

## 2015-07-30 DIAGNOSIS — G629 Polyneuropathy, unspecified: Secondary | ICD-10-CM | POA: Insufficient documentation

## 2015-07-30 DIAGNOSIS — Z933 Colostomy status: Secondary | ICD-10-CM | POA: Diagnosis not present

## 2015-07-30 DIAGNOSIS — Z86718 Personal history of other venous thrombosis and embolism: Secondary | ICD-10-CM | POA: Diagnosis not present

## 2015-07-30 DIAGNOSIS — J439 Emphysema, unspecified: Secondary | ICD-10-CM | POA: Diagnosis not present

## 2015-07-30 DIAGNOSIS — C20 Malignant neoplasm of rectum: Secondary | ICD-10-CM | POA: Diagnosis not present

## 2015-07-30 DIAGNOSIS — C7800 Secondary malignant neoplasm of unspecified lung: Secondary | ICD-10-CM

## 2015-07-30 DIAGNOSIS — Z9221 Personal history of antineoplastic chemotherapy: Secondary | ICD-10-CM | POA: Insufficient documentation

## 2015-07-30 DIAGNOSIS — Z85048 Personal history of other malignant neoplasm of rectum, rectosigmoid junction, and anus: Secondary | ICD-10-CM | POA: Diagnosis not present

## 2015-07-30 DIAGNOSIS — M8580 Other specified disorders of bone density and structure, unspecified site: Secondary | ICD-10-CM | POA: Insufficient documentation

## 2015-07-30 DIAGNOSIS — I2510 Atherosclerotic heart disease of native coronary artery without angina pectoris: Secondary | ICD-10-CM | POA: Insufficient documentation

## 2015-07-30 DIAGNOSIS — Z803 Family history of malignant neoplasm of breast: Secondary | ICD-10-CM | POA: Insufficient documentation

## 2015-07-30 DIAGNOSIS — D696 Thrombocytopenia, unspecified: Secondary | ICD-10-CM | POA: Diagnosis not present

## 2015-07-30 DIAGNOSIS — Z79899 Other long term (current) drug therapy: Secondary | ICD-10-CM | POA: Diagnosis not present

## 2015-07-30 DIAGNOSIS — K449 Diaphragmatic hernia without obstruction or gangrene: Secondary | ICD-10-CM | POA: Diagnosis not present

## 2015-07-30 DIAGNOSIS — F17210 Nicotine dependence, cigarettes, uncomplicated: Secondary | ICD-10-CM | POA: Insufficient documentation

## 2015-07-30 DIAGNOSIS — K769 Liver disease, unspecified: Secondary | ICD-10-CM | POA: Insufficient documentation

## 2015-07-30 DIAGNOSIS — I10 Essential (primary) hypertension: Secondary | ICD-10-CM | POA: Insufficient documentation

## 2015-07-30 DIAGNOSIS — N281 Cyst of kidney, acquired: Secondary | ICD-10-CM | POA: Diagnosis not present

## 2015-07-30 DIAGNOSIS — Z800 Family history of malignant neoplasm of digestive organs: Secondary | ICD-10-CM | POA: Diagnosis not present

## 2015-07-30 HISTORY — DX: Secondary malignant neoplasm of unspecified lung: C78.00

## 2015-07-30 HISTORY — DX: Malignant neoplasm of rectum: C20

## 2015-07-30 LAB — CBC WITH DIFFERENTIAL/PLATELET
BASOS PCT: 1 %
Basophils Absolute: 0.1 10*3/uL (ref 0–0.1)
EOS ABS: 0.3 10*3/uL (ref 0–0.7)
Eosinophils Relative: 4 %
HCT: 39.7 % — ABNORMAL LOW (ref 40.0–52.0)
Hemoglobin: 13.7 g/dL (ref 13.0–18.0)
Lymphocytes Relative: 11 %
Lymphs Abs: 0.9 10*3/uL — ABNORMAL LOW (ref 1.0–3.6)
MCH: 37.1 pg — ABNORMAL HIGH (ref 26.0–34.0)
MCHC: 34.5 g/dL (ref 32.0–36.0)
MCV: 107.5 fL — ABNORMAL HIGH (ref 80.0–100.0)
MONOS PCT: 9 %
Monocytes Absolute: 0.7 10*3/uL (ref 0.2–1.0)
NEUTROS PCT: 75 %
Neutro Abs: 6.4 10*3/uL (ref 1.4–6.5)
Platelets: 137 10*3/uL — ABNORMAL LOW (ref 150–440)
RBC: 3.7 MIL/uL — ABNORMAL LOW (ref 4.40–5.90)
RDW: 16 % — AB (ref 11.5–14.5)
WBC: 8.4 10*3/uL (ref 3.8–10.6)

## 2015-07-30 LAB — COMPREHENSIVE METABOLIC PANEL
ALK PHOS: 55 U/L (ref 38–126)
ALT: 15 U/L — ABNORMAL LOW (ref 17–63)
AST: 26 U/L (ref 15–41)
Albumin: 4 g/dL (ref 3.5–5.0)
Anion gap: 7 (ref 5–15)
BUN: 17 mg/dL (ref 6–20)
CO2: 24 mmol/L (ref 22–32)
CREATININE: 1.35 mg/dL — AB (ref 0.61–1.24)
Calcium: 8.9 mg/dL (ref 8.9–10.3)
Chloride: 105 mmol/L (ref 101–111)
GFR calc non Af Amer: 52 mL/min — ABNORMAL LOW (ref >60)
GLUCOSE: 108 mg/dL — AB (ref 65–99)
Potassium: 4.6 mmol/L (ref 3.5–5.1)
SODIUM: 136 mmol/L (ref 135–145)
Total Bilirubin: 0.6 mg/dL (ref 0.3–1.2)
Total Protein: 6.8 g/dL (ref 6.5–8.1)

## 2015-07-30 MED ORDER — SODIUM CHLORIDE 0.9 % IJ SOLN
10.0000 mL | Freq: Once | INTRAMUSCULAR | Status: AC
  Administered 2015-07-30: 10 mL via INTRAVENOUS
  Filled 2015-07-30: qty 10

## 2015-07-30 MED ORDER — IOHEXOL 300 MG/ML  SOLN
100.0000 mL | Freq: Once | INTRAMUSCULAR | Status: AC | PRN
  Administered 2015-07-30: 100 mL via INTRAVENOUS

## 2015-07-30 MED ORDER — HEPARIN SOD (PORK) LOCK FLUSH 100 UNIT/ML IV SOLN
500.0000 [IU] | Freq: Once | INTRAVENOUS | Status: AC
  Administered 2015-07-30: 500 [IU] via INTRAVENOUS

## 2015-07-30 MED ORDER — HEPARIN SOD (PORK) LOCK FLUSH 100 UNIT/ML IV SOLN: 500.0000 [IU] | Freq: Once | INTRAVENOUS | Status: DC

## 2015-07-31 LAB — CEA: CEA: 2.4 ng/mL (ref 0.0–4.7)

## 2015-08-05 ENCOUNTER — Inpatient Hospital Stay (HOSPITAL_BASED_OUTPATIENT_CLINIC_OR_DEPARTMENT_OTHER): Payer: Medicare HMO | Admitting: Oncology

## 2015-08-05 VITALS — BP 174/83 | HR 59 | Temp 97.4°F | Resp 18 | Wt 186.1 lb

## 2015-08-05 DIAGNOSIS — Z9221 Personal history of antineoplastic chemotherapy: Secondary | ICD-10-CM

## 2015-08-05 DIAGNOSIS — G629 Polyneuropathy, unspecified: Secondary | ICD-10-CM | POA: Diagnosis not present

## 2015-08-05 DIAGNOSIS — Z85048 Personal history of other malignant neoplasm of rectum, rectosigmoid junction, and anus: Secondary | ICD-10-CM | POA: Diagnosis not present

## 2015-08-05 DIAGNOSIS — F17210 Nicotine dependence, cigarettes, uncomplicated: Secondary | ICD-10-CM

## 2015-08-05 DIAGNOSIS — C20 Malignant neoplasm of rectum: Secondary | ICD-10-CM

## 2015-08-05 DIAGNOSIS — D696 Thrombocytopenia, unspecified: Secondary | ICD-10-CM | POA: Diagnosis not present

## 2015-08-05 DIAGNOSIS — Z79899 Other long term (current) drug therapy: Secondary | ICD-10-CM

## 2015-08-05 DIAGNOSIS — I10 Essential (primary) hypertension: Secondary | ICD-10-CM

## 2015-08-05 DIAGNOSIS — I2510 Atherosclerotic heart disease of native coronary artery without angina pectoris: Secondary | ICD-10-CM

## 2015-08-05 DIAGNOSIS — M8580 Other specified disorders of bone density and structure, unspecified site: Secondary | ICD-10-CM

## 2015-08-05 DIAGNOSIS — Z933 Colostomy status: Secondary | ICD-10-CM

## 2015-08-05 DIAGNOSIS — Z800 Family history of malignant neoplasm of digestive organs: Secondary | ICD-10-CM

## 2015-08-05 DIAGNOSIS — K449 Diaphragmatic hernia without obstruction or gangrene: Secondary | ICD-10-CM

## 2015-08-05 DIAGNOSIS — Z86718 Personal history of other venous thrombosis and embolism: Secondary | ICD-10-CM

## 2015-08-05 DIAGNOSIS — N281 Cyst of kidney, acquired: Secondary | ICD-10-CM

## 2015-08-05 DIAGNOSIS — C7800 Secondary malignant neoplasm of unspecified lung: Principal | ICD-10-CM

## 2015-08-05 DIAGNOSIS — K769 Liver disease, unspecified: Secondary | ICD-10-CM

## 2015-08-05 DIAGNOSIS — Z803 Family history of malignant neoplasm of breast: Secondary | ICD-10-CM

## 2015-08-05 NOTE — Progress Notes (Signed)
Patient still has neuropathy in his hands, feet, and legs.

## 2015-08-13 NOTE — Progress Notes (Signed)
[facility] [place]  Telephone:(336) [phone] Fax:(336) [phone]  ID: Toniann Fail OB: 03-07-1946  MR#: [id number]  [id number]#:[id number]  Patient Care Team: [name], MD as PCP - General (Family Medicine)  CHIEF COMPLAINT:  Chief Complaint  Patient presents with  . Rectal Cancer    INTERVAL HISTORY: Patient returns to clinic today for laboratory work, discussion of his imaging results, and further evaluation. He continues to have a mild peripheral neuropathy, but states it does not affect his day-to-day life. He otherwise feels well.  He does not complain of any further bleeding.  He has no other neurologic complaints. He denies any chest pain or shortness of breath. He has a good appetite and denies weight loss. He denies any recent fevers. He denies any nausea, vomiting, constipation, or diarrhea. He has no melena or hematochezia. 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 Patient offers no further specific complaints today.  REVIEW OF SYSTEMS:   Review of Systems  Constitutional: Negative.   Respiratory: Negative.   Cardiovascular: Negative.   Gastrointestinal: Negative.   Neurological: Positive for sensory change.    As per HPI. Otherwise, a complete review of systems is negatve.  PAST MEDICAL HISTORY: Past Medical History  Diagnosis Date  . History of anemia   . History of chicken pox   . HTN (hypertension)   . Smoker   . Allergic rhinitis   . 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 Varicose vein surgery  2010  . Tonsillectomy and adenoidectomy  Childhood  . Eus N/A 11/15/2013    Procedure: LOWER ENDOSCOPIC ULTRASOUND (EUS);  Surgeon: Milus Banister, MD;  Location: [facility];  Service: Endoscopy;  Laterality: N/A;    FAMILY HISTORY Family History  Problem Relation Age of Onset  . Hypertension Mother   . Coronary artery disease Mother     angina  .  Cancer Mother     breast  . Cancer Father 77    esophageal  . COPD Brother     emphysema  . Cancer Paternal Grandmother     colon  . Diabetes Neg Hx        ADVANCED DIRECTIVES:    HEALTH MAINTENANCE: Social History  Substance Use Topics  . Smoking status: Current Every Day Smoker -- 0.80 packs/day    Types: Cigarettes  . Smokeless tobacco: Never Used  . Alcohol Use: Yes     Comment: Regular 2 scotch/day     Colonoscopy:  PAP:  Bone density:  Lipid panel:  Allergies  Allergen Reactions  . No Known Allergies     Current Outpatient Prescriptions  Medication Sig Dispense Refill  . acetaminophen (TYLENOL) 500 MG tablet Take by mouth.    . loratadine (CLARITIN) 10 MG tablet Take 10 mg by mouth daily as needed.    Marland Kitchen olmesartan (BENICAR) 20 MG tablet Take 20 mg by mouth daily.    . polyethylene glycol (MIRALAX / GLYCOLAX) packet Take by mouth daily as needed.      No current facility-administered medications for this visit.    OBJECTIVE: Filed Vitals:   08/05/15 1126  BP: 174/83  Pulse: 59  Temp: 97.4 F (36.3 C)  Resp: 18     Body mass index is 29.14 kg/(m^2).    ECOG FS:0 - Asymptomatic  General: Well-developed, well-nourished, no acute distress. Eyes: anicteric sclera. Lungs: Clear to auscultation bilaterally. Heart: Regular rate and  rhythm. No rubs, murmurs, or gallops. Abdomen: Soft, nontender, nondistended. No organomegaly noted, normoactive bowel sounds. Colostomy noted. Musculoskeletal: No edema, cyanosis, or clubbing. Neuro: Alert, answering all questions appropriately. Cranial nerves grossly intact. Skin: No rashes or petechiae noted. Psych: Normal affect.   LAB RESULTS:  Lab Results  Component Value Date   NA 136 07/30/2015   K 4.6 07/30/2015   CL 105 07/30/2015   CO2 24 07/30/2015   GLUCOSE 108* 07/30/2015   BUN 17 07/30/2015   CREATININE 1.35* 07/30/2015   CALCIUM 8.9 07/30/2015   PROT 6.8 07/30/2015   ALBUMIN 4.0 07/30/2015   AST  26 07/30/2015   ALT 15* 07/30/2015   ALKPHOS 55 07/30/2015   BILITOT 0.6 07/30/2015   GFRNONAA 52* 07/30/2015   GFRAA >60 07/30/2015    Lab Results  Component Value Date   WBC 8.4 07/30/2015   NEUTROABS 6.4 07/30/2015   HGB 13.7 07/30/2015   HCT 39.7* 07/30/2015   MCV 107.5* 07/30/2015   PLT 137* 07/30/2015     STUDIES: Ct Chest W Contrast  07/30/2015   CLINICAL DATA:  Restaging of rectal cancer. Partial resection with colectomy. Lung metastasis, resected and treated with chemotherapy.  EXAM: CT CHEST, ABDOMEN, AND PELVIS WITH CONTRAST  TECHNIQUE: Multidetector CT imaging of the chest, abdomen and pelvis was performed following the standard protocol during bolus administration of intravenous contrast.  CONTRAST:  173m OMNIPAQUE IOHEXOL 300 MG/ML  SOLN  COMPARISON:  PET of 01/26/2015  FINDINGS: CT CHEST FINDINGS  Mediastinum/Nodes: Right Port-A-Cath which terminates at the high right atrium. No supraclavicular adenopathy. Aortic and branch vessel atherosclerosis. Normal heart size, without pericardial effusion. Multivessel coronary artery atherosclerosis. No central pulmonary embolism, on this non-dedicated study. Small middle mediastinal nodes, none pathologic by size criteria. No hilar adenopathy. Tiny hiatal hernia.  Lungs/Pleura: No pleural fluid.  Moderate centrilobular emphysema.  Calcified granuloma in the right lower lobe on image 40 at 2 mm.  Left lower lobe scarring, secondary to wedge resection.  Musculoskeletal: Left thoracotomy changes.  CT ABDOMEN PELVIS FINDINGS  Hepatobiliary: Scattered well-circumscribed low-density liver lesions are likely cysts. Normal gallbladder, without biliary ductal dilatation.  Pancreas: Normal, without mass or ductal dilatation.  Spleen: Normal in size, without focal abnormality.  Adrenals/Urinary Tract: Mild bilateral adrenal thickening is similar. No dominant nodule. An interpolar right renal cyst. At least partially duplicated right renal collecting  system. Normal left kidney. No hydronephrosis. Normal urinary bladder.  Stomach/Bowel: Normal stomach, without wall thickening. Surgical changes of anal rectal resection with descending colostomy. Presacral fascia wall thickening, without well-defined residual or recurrent mass. Large stool burden throughout the remainder of the colon. Normal terminal ileum and appendix. Normal small bowel.  Vascular/Lymphatic: Aortic and branch vessel atherosclerosis. No abdominopelvic adenopathy.  Reproductive: Normal prostate.  Other: No significant free fluid. No evidence of omental or peritoneal disease. Bilateral small fat containing inguinal hernias.  Musculoskeletal: Mild osteopenia.  IMPRESSION: CT CHEST IMPRESSION  1. Surgical changes in the left lower lobe, without residual or recurrent metastatic disease. 2.  Atherosclerosis, including within the coronary arteries. 3. Moderate centrilobular emphysema.  CT ABDOMEN AND PELVIS IMPRESSION  Surgical changes of rectal resection and left lower quadrant colostomy. No acute process or metastatic disease.   Electronically Signed   By: KAbigail MiyamotoM.D.   On: 07/30/2015 11:53   Ct Abdomen Pelvis W Contrast  07/30/2015   CLINICAL DATA:  Restaging of rectal cancer. Partial resection with colectomy. Lung metastasis, resected and treated with chemotherapy.  EXAM:  CT CHEST, ABDOMEN, AND PELVIS WITH CONTRAST  TECHNIQUE: Multidetector CT imaging of the chest, abdomen and pelvis was performed following the standard protocol during bolus administration of intravenous contrast.  CONTRAST:  176m OMNIPAQUE IOHEXOL 300 MG/ML  SOLN  COMPARISON:  PET of 01/26/2015  FINDINGS: CT CHEST FINDINGS  Mediastinum/Nodes: Right Port-A-Cath which terminates at the high right atrium. No supraclavicular adenopathy. Aortic and branch vessel atherosclerosis. Normal heart size, without pericardial effusion. Multivessel coronary artery atherosclerosis. No central pulmonary embolism, on this non-dedicated  study. Small middle mediastinal nodes, none pathologic by size criteria. No hilar adenopathy. Tiny hiatal hernia.  Lungs/Pleura: No pleural fluid.  Moderate centrilobular emphysema.  Calcified granuloma in the right lower lobe on image 40 at 2 mm.  Left lower lobe scarring, secondary to wedge resection.  Musculoskeletal: Left thoracotomy changes.  CT ABDOMEN PELVIS FINDINGS  Hepatobiliary: Scattered well-circumscribed low-density liver lesions are likely cysts. Normal gallbladder, without biliary ductal dilatation.  Pancreas: Normal, without mass or ductal dilatation.  Spleen: Normal in size, without focal abnormality.  Adrenals/Urinary Tract: Mild bilateral adrenal thickening is similar. No dominant nodule. An interpolar right renal cyst. At least partially duplicated right renal collecting system. Normal left kidney. No hydronephrosis. Normal urinary bladder.  Stomach/Bowel: Normal stomach, without wall thickening. Surgical changes of anal rectal resection with descending colostomy. Presacral fascia wall thickening, without well-defined residual or recurrent mass. Large stool burden throughout the remainder of the colon. Normal terminal ileum and appendix. Normal small bowel.  Vascular/Lymphatic: Aortic and branch vessel atherosclerosis. No abdominopelvic adenopathy.  Reproductive: Normal prostate.  Other: No significant free fluid. No evidence of omental or peritoneal disease. Bilateral small fat containing inguinal hernias.  Musculoskeletal: Mild osteopenia.  IMPRESSION: CT CHEST IMPRESSION  1. Surgical changes in the left lower lobe, without residual or recurrent metastatic disease. 2.  Atherosclerosis, including within the coronary arteries. 3. Moderate centrilobular emphysema.  CT ABDOMEN AND PELVIS IMPRESSION  Surgical changes of rectal resection and left lower quadrant colostomy. No acute process or metastatic disease.   Electronically Signed   By: KAbigail MiyamotoM.D.   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   PLAN:    1. Rectal cancer: CT scan results from July 30, 2015 reviewed independently and reported as above with no evidence of recurrence. No further intervention is needed at this time. Return to clinic in 3 months with repeat laboratory work and further evaluation. Will reimage with CT every 6 months for approximately 2 years after completing his treatments which was on December 09, 2014.  2. Left lower extremity DVT: Patient has now discontinued Xarelto. 3. Thrombocytopenia: Improving.  4. Neutropenia: Resolved.   5. 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 Monitor.   Patient expressed understanding and was in agreement with this plan. He also understands that He can call clinic at any time with any questions, concerns, or complaints.    TLloyd Huger MD   08/13/2015 2:39 PM

## 2015-09-11 ENCOUNTER — Inpatient Hospital Stay: Payer: Medicare HMO | Attending: Oncology

## 2015-09-11 DIAGNOSIS — C801 Malignant (primary) neoplasm, unspecified: Secondary | ICD-10-CM

## 2015-09-11 DIAGNOSIS — C20 Malignant neoplasm of rectum: Secondary | ICD-10-CM | POA: Diagnosis present

## 2015-09-11 DIAGNOSIS — Z452 Encounter for adjustment and management of vascular access device: Secondary | ICD-10-CM | POA: Insufficient documentation

## 2015-09-11 MED ORDER — HEPARIN SOD (PORK) LOCK FLUSH 100 UNIT/ML IV SOLN
500.0000 [IU] | Freq: Once | INTRAVENOUS | Status: AC
  Administered 2015-09-11: 500 [IU] via INTRAVENOUS
  Filled 2015-09-11: qty 5

## 2015-09-11 MED ORDER — SODIUM CHLORIDE 0.9 % IJ SOLN
10.0000 mL | INTRAMUSCULAR | Status: DC | PRN
  Administered 2015-09-11: 10 mL via INTRAVENOUS
  Filled 2015-09-11: qty 10

## 2015-10-23 ENCOUNTER — Inpatient Hospital Stay: Payer: Medicare HMO | Attending: Oncology

## 2015-10-23 DIAGNOSIS — Z933 Colostomy status: Secondary | ICD-10-CM | POA: Insufficient documentation

## 2015-10-23 DIAGNOSIS — Z85048 Personal history of other malignant neoplasm of rectum, rectosigmoid junction, and anus: Secondary | ICD-10-CM | POA: Diagnosis present

## 2015-10-23 DIAGNOSIS — G620 Drug-induced polyneuropathy: Secondary | ICD-10-CM | POA: Insufficient documentation

## 2015-10-23 DIAGNOSIS — Z79899 Other long term (current) drug therapy: Secondary | ICD-10-CM | POA: Diagnosis not present

## 2015-10-23 DIAGNOSIS — Z86718 Personal history of other venous thrombosis and embolism: Secondary | ICD-10-CM | POA: Diagnosis not present

## 2015-10-23 DIAGNOSIS — Z800 Family history of malignant neoplasm of digestive organs: Secondary | ICD-10-CM | POA: Diagnosis not present

## 2015-10-23 DIAGNOSIS — I10 Essential (primary) hypertension: Secondary | ICD-10-CM | POA: Diagnosis not present

## 2015-10-23 DIAGNOSIS — Z452 Encounter for adjustment and management of vascular access device: Secondary | ICD-10-CM | POA: Insufficient documentation

## 2015-10-23 DIAGNOSIS — K6289 Other specified diseases of anus and rectum: Secondary | ICD-10-CM | POA: Insufficient documentation

## 2015-10-23 DIAGNOSIS — Z803 Family history of malignant neoplasm of breast: Secondary | ICD-10-CM | POA: Diagnosis not present

## 2015-10-23 DIAGNOSIS — C801 Malignant (primary) neoplasm, unspecified: Secondary | ICD-10-CM

## 2015-10-23 DIAGNOSIS — F17210 Nicotine dependence, cigarettes, uncomplicated: Secondary | ICD-10-CM | POA: Insufficient documentation

## 2015-10-23 DIAGNOSIS — Z862 Personal history of diseases of the blood and blood-forming organs and certain disorders involving the immune mechanism: Secondary | ICD-10-CM | POA: Diagnosis not present

## 2015-10-23 MED ORDER — HEPARIN SOD (PORK) LOCK FLUSH 100 UNIT/ML IV SOLN
500.0000 [IU] | Freq: Once | INTRAVENOUS | Status: AC
  Administered 2015-10-23: 500 [IU] via INTRAVENOUS

## 2015-10-23 MED ORDER — HEPARIN SOD (PORK) LOCK FLUSH 100 UNIT/ML IV SOLN
INTRAVENOUS | Status: AC
Start: 2015-10-23 — End: 2015-10-23
  Filled 2015-10-23: qty 5

## 2015-10-23 MED ORDER — SODIUM CHLORIDE 0.9 % IJ SOLN
10.0000 mL | Freq: Once | INTRAMUSCULAR | Status: AC
  Administered 2015-10-23: 10 mL via INTRAVENOUS
  Filled 2015-10-23: qty 10

## 2015-11-04 ENCOUNTER — Inpatient Hospital Stay (HOSPITAL_BASED_OUTPATIENT_CLINIC_OR_DEPARTMENT_OTHER): Payer: Medicare HMO | Admitting: Oncology

## 2015-11-04 ENCOUNTER — Inpatient Hospital Stay: Payer: Medicare HMO

## 2015-11-04 VITALS — BP 147/92 | HR 78 | Temp 99.0°F | Resp 16 | Wt 187.4 lb

## 2015-11-04 DIAGNOSIS — G620 Drug-induced polyneuropathy: Secondary | ICD-10-CM | POA: Diagnosis not present

## 2015-11-04 DIAGNOSIS — Z79899 Other long term (current) drug therapy: Secondary | ICD-10-CM

## 2015-11-04 DIAGNOSIS — Z86718 Personal history of other venous thrombosis and embolism: Secondary | ICD-10-CM

## 2015-11-04 DIAGNOSIS — Z85048 Personal history of other malignant neoplasm of rectum, rectosigmoid junction, and anus: Secondary | ICD-10-CM | POA: Diagnosis not present

## 2015-11-04 DIAGNOSIS — Z862 Personal history of diseases of the blood and blood-forming organs and certain disorders involving the immune mechanism: Secondary | ICD-10-CM

## 2015-11-04 DIAGNOSIS — F17210 Nicotine dependence, cigarettes, uncomplicated: Secondary | ICD-10-CM

## 2015-11-04 DIAGNOSIS — C20 Malignant neoplasm of rectum: Secondary | ICD-10-CM

## 2015-11-04 DIAGNOSIS — I10 Essential (primary) hypertension: Secondary | ICD-10-CM

## 2015-11-04 DIAGNOSIS — K6289 Other specified diseases of anus and rectum: Secondary | ICD-10-CM | POA: Diagnosis not present

## 2015-11-04 DIAGNOSIS — C7800 Secondary malignant neoplasm of unspecified lung: Principal | ICD-10-CM

## 2015-11-04 DIAGNOSIS — Z803 Family history of malignant neoplasm of breast: Secondary | ICD-10-CM

## 2015-11-04 DIAGNOSIS — Z933 Colostomy status: Secondary | ICD-10-CM

## 2015-11-04 DIAGNOSIS — Z452 Encounter for adjustment and management of vascular access device: Secondary | ICD-10-CM

## 2015-11-04 DIAGNOSIS — Z23 Encounter for immunization: Secondary | ICD-10-CM

## 2015-11-04 DIAGNOSIS — Z800 Family history of malignant neoplasm of digestive organs: Secondary | ICD-10-CM

## 2015-11-04 LAB — COMPREHENSIVE METABOLIC PANEL
ALT: 22 U/L (ref 17–63)
ANION GAP: 10 (ref 5–15)
AST: 31 U/L (ref 15–41)
Albumin: 4.4 g/dL (ref 3.5–5.0)
Alkaline Phosphatase: 56 U/L (ref 38–126)
BILIRUBIN TOTAL: 0.9 mg/dL (ref 0.3–1.2)
BUN: 18 mg/dL (ref 6–20)
CHLORIDE: 100 mmol/L — AB (ref 101–111)
CO2: 20 mmol/L — ABNORMAL LOW (ref 22–32)
Calcium: 8.9 mg/dL (ref 8.9–10.3)
Creatinine, Ser: 1.52 mg/dL — ABNORMAL HIGH (ref 0.61–1.24)
GFR, EST AFRICAN AMERICAN: 53 mL/min — AB (ref >60)
GFR, EST NON AFRICAN AMERICAN: 45 mL/min — AB (ref >60)
Glucose, Bld: 117 mg/dL — ABNORMAL HIGH (ref 65–99)
POTASSIUM: 4.4 mmol/L (ref 3.5–5.1)
Sodium: 130 mmol/L — ABNORMAL LOW (ref 135–145)
TOTAL PROTEIN: 7.2 g/dL (ref 6.5–8.1)

## 2015-11-04 LAB — CBC WITH DIFFERENTIAL/PLATELET
Basophils Absolute: 0.1 10*3/uL (ref 0–0.1)
Basophils Relative: 1 %
Eosinophils Absolute: 0.3 10*3/uL (ref 0–0.7)
Eosinophils Relative: 4 %
HCT: 43.7 % (ref 40.0–52.0)
HEMOGLOBIN: 15.1 g/dL (ref 13.0–18.0)
LYMPHS ABS: 1.2 10*3/uL (ref 1.0–3.6)
LYMPHS PCT: 16 %
MCH: 38.2 pg — AB (ref 26.0–34.0)
MCHC: 34.5 g/dL (ref 32.0–36.0)
MCV: 110.6 fL — AB (ref 80.0–100.0)
Monocytes Absolute: 0.8 10*3/uL (ref 0.2–1.0)
Monocytes Relative: 11 %
NEUTROS PCT: 68 %
Neutro Abs: 5.1 10*3/uL (ref 1.4–6.5)
PLATELETS: 192 10*3/uL (ref 150–440)
RBC: 3.95 MIL/uL — AB (ref 4.40–5.90)
RDW: 15.8 % — AB (ref 11.5–14.5)
WBC: 7.5 10*3/uL (ref 3.8–10.6)

## 2015-11-04 MED ORDER — INFLUENZA VAC SPLIT QUAD 0.5 ML IM SUSY
0.5000 mL | PREFILLED_SYRINGE | Freq: Once | INTRAMUSCULAR | Status: DC
  Filled 2015-11-04: qty 0.5

## 2015-11-04 NOTE — Progress Notes (Signed)
Patient's neuropathy has worsened in cold weather and has added vitamin B6 with no relief.  Also has ain increase in rectal pain/pressure that gets worse with constipation or sitting in certain positions.

## 2015-11-05 LAB — CEA: CEA: 2.2 ng/mL (ref 0.0–4.7)

## 2015-11-12 ENCOUNTER — Telehealth: Payer: Self-pay | Admitting: *Deleted

## 2015-11-12 ENCOUNTER — Other Ambulatory Visit: Payer: Self-pay | Admitting: Oncology

## 2015-11-12 DIAGNOSIS — C20 Malignant neoplasm of rectum: Secondary | ICD-10-CM

## 2015-11-12 NOTE — Telephone Encounter (Signed)
I received call from Aleen Sells that CT scan ordered for patient needs to be reordered with a new diagnosis linked. The diagnosis needed will need to be pain as primary and history of rectal cancer as secondary, looks like now it is linked to a diagnosis involving his flu vaccine.

## 2015-11-15 NOTE — Progress Notes (Signed)
[place]  Telephone:(336) [phone] Fax:(336) [phone]  ID: Stephen [name] OB: [birth year]  MR#: [id number]  RFX#:[id number]  Patient Care Team: [name], MD as PCP - General (Family Medicine)  CHIEF COMPLAINT:  Chief Complaint  Patient presents with  . Rectal Cancer    INTERVAL HISTORY: Patient returns to clinic today for laboratory work and further evaluation. He continues to have a mild peripheral neuropathy and states it is slightly worse in the cold weather. It still does not affect his day-to-day life. He also admits to increasing rectal pain/pressure.  He does not complain of any further bleeding. He has no other neurologic complaints. He denies any chest pain or shortness of breath. He has a good appetite and denies weight loss. He denies any recent fevers. He denies any nausea, vomiting, constipation, or diarrhea. He has no melena or hematochezia. 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 Patient offers no further specific complaints today.  REVIEW OF SYSTEMS:   Review of Systems  Constitutional: Negative.  Negative for fever and malaise/fatigue.  Respiratory: Negative.  Negative for cough, hemoptysis and shortness of breath.   Cardiovascular: Negative.  Negative for chest pain.  Gastrointestinal:       Pelvic/rectal pain.  Neurological: Positive for sensory change. Negative for weakness.    As per HPI. Otherwise, a complete review of systems is negatve.  PAST MEDICAL HISTORY: Past Medical History  Diagnosis Date  . History of anemia   . History of chicken pox   . HTN (hypertension)   . Smoker   . Allergic rhinitis   . 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 Varicose vein surgery  2010  . Tonsillectomy and adenoidectomy  Childhood  . Eus N/A 11/15/2013    Procedure: LOWER ENDOSCOPIC ULTRASOUND (EUS);  Surgeon: Milus Banister, MD;  Location: [facility];  Service: Endoscopy;  Laterality: N/A;    FAMILY HISTORY Family History  Problem Relation Age of Onset  . Hypertension Mother   . Coronary artery disease Mother     angina  . Cancer Mother     breast  . Cancer Father 56    esophageal  . COPD Brother     emphysema  . Cancer Paternal Grandmother     colon  . Diabetes Neg Hx        ADVANCED DIRECTIVES:    HEALTH MAINTENANCE: Social History  Substance Use Topics  . Smoking status: Current Every Day Smoker -- 0.80 packs/day    Types: Cigarettes  . Smokeless tobacco: Never Used  . Alcohol Use: Yes     Comment: Regular 2 scotch/day     Colonoscopy:  PAP:  Bone density:  Lipid panel:  Allergies  Allergen Reactions  . No Known Allergies     Current Outpatient Prescriptions  Medication Sig Dispense Refill  . acetaminophen (TYLENOL) 500 MG tablet Take by mouth.    . loratadine (CLARITIN) 10 MG tablet Take 10 mg by mouth daily as needed.    Marland Kitchen olmesartan (BENICAR) 20 MG tablet Take 20 mg by mouth daily.    . polyethylene glycol (MIRALAX / GLYCOLAX) packet Take by mouth daily as needed.      Current Facility-Administered Medications  Medication Dose Route Frequency Provider Last Rate Last Dose  . Influenza vac split quadrivalent PF (FLUARIX) injection 0.5 mL  0.5 mL Intramuscular Once Lloyd Huger, MD  0.5 mL at 11/04/15 1155    OBJECTIVE: Filed Vitals:   11/04/15 1104  BP: 147/92  Pulse: 78  Temp: 99 F (37.2 C)  Resp: 16     Body mass index is 29.34 kg/(m^2).    ECOG FS:0 - Asymptomatic  General: Well-developed, well-nourished, no acute distress. Eyes: anicteric sclera. Lungs: Clear to auscultation bilaterally. Heart: Regular rate and rhythm. No rubs, murmurs, or gallops. Abdomen: Soft, nontender, nondistended. No organomegaly noted, normoactive bowel sounds. Colostomy noted. Musculoskeletal: No edema, cyanosis, or clubbing. Neuro: Alert, answering all questions appropriately. Cranial  nerves grossly intact. Skin: No rashes or petechiae noted. Psych: Normal affect.   LAB RESULTS:  Lab Results  Component Value Date   NA 130* 11/04/2015   K 4.4 11/04/2015   CL 100* 11/04/2015   CO2 20* 11/04/2015   GLUCOSE 117* 11/04/2015   BUN 18 11/04/2015   CREATININE 1.52* 11/04/2015   CALCIUM 8.9 11/04/2015   PROT 7.2 11/04/2015   ALBUMIN 4.4 11/04/2015   AST 31 11/04/2015   ALT 22 11/04/2015   ALKPHOS 56 11/04/2015   BILITOT 0.9 11/04/2015   GFRNONAA 45* 11/04/2015   GFRAA 53* 11/04/2015    Lab Results  Component Value Date   WBC 7.5 11/04/2015   NEUTROABS 5.1 11/04/2015   HGB 15.1 11/04/2015   HCT 43.7 11/04/2015   MCV 110.6* 11/04/2015   PLT 192 11/04/2015     STUDIES: No results found.  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   PLAN:    1. Rectal cancer: CT scan results from July 30, 2015 reviewed independently and reported as above with no evidence of recurrence. Given his increasing rectal pain, will reorder CT scan in the next 1-2 weeks to assess for any interval change. If negative, patient will return to clinic in 3 months with repeat laboratory work and further evaluation. Will reimage with CT every 6 months for approximately 2 years after completing his treatments which was on December 09, 2014.  2. Left lower extremity DVT: Patient has now discontinued Xarelto. 3. Thrombocytopenia: Patient's platelets are now within normal limits. 4. Neutropenia: Resolved.   5. 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 Patient was again offered gabapentin, but he wishes not to pursue treatment at this time.  Patient expressed understanding and was in agreement with this plan. He also understands that He can call clinic at any time with any questions, concerns, or complaints.    Lloyd Huger, MD   11/15/2015 9:39 AM

## 2015-11-18 ENCOUNTER — Ambulatory Visit
Admission: RE | Admit: 2015-11-18 | Discharge: 2015-11-18 | Disposition: A | Discharge status: Home / Self Care | Payer: Medicare HMO | Source: Ambulatory Visit | Attending: Oncology | Admitting: Oncology

## 2015-11-18 DIAGNOSIS — R52 Pain, unspecified: Secondary | ICD-10-CM | POA: Insufficient documentation

## 2015-11-18 DIAGNOSIS — I2510 Atherosclerotic heart disease of native coronary artery without angina pectoris: Secondary | ICD-10-CM | POA: Insufficient documentation

## 2015-11-18 DIAGNOSIS — Z933 Colostomy status: Secondary | ICD-10-CM | POA: Insufficient documentation

## 2015-11-18 DIAGNOSIS — C20 Malignant neoplasm of rectum: Secondary | ICD-10-CM | POA: Diagnosis present

## 2015-11-18 MED ORDER — IOHEXOL 300 MG/ML  SOLN
100.0000 mL | Freq: Once | INTRAMUSCULAR | Status: AC | PRN
  Administered 2015-11-18: 80 mL via INTRAVENOUS

## 2015-12-16 ENCOUNTER — Inpatient Hospital Stay: Payer: Medicare HMO | Attending: Oncology

## 2015-12-16 DIAGNOSIS — C20 Malignant neoplasm of rectum: Secondary | ICD-10-CM

## 2015-12-16 MED ORDER — SODIUM CHLORIDE 0.9% FLUSH
10.0000 mL | Freq: Once | INTRAVENOUS | Status: AC
  Administered 2015-12-16: 10 mL via INTRAVENOUS
  Filled 2015-12-16: qty 10

## 2015-12-16 MED ORDER — HEPARIN SOD (PORK) LOCK FLUSH 100 UNIT/ML IV SOLN
500.0000 [IU] | Freq: Once | INTRAVENOUS | Status: AC
  Administered 2015-12-16: 500 [IU] via INTRAVENOUS
  Filled 2015-12-16: qty 5

## 2016-02-02 ENCOUNTER — Ambulatory Visit: Payer: Medicare HMO | Admitting: Oncology

## 2016-02-02 ENCOUNTER — Other Ambulatory Visit: Payer: Medicare HMO

## 2016-02-03 ENCOUNTER — Inpatient Hospital Stay: Payer: Medicare HMO | Attending: Oncology

## 2016-02-03 ENCOUNTER — Inpatient Hospital Stay (HOSPITAL_BASED_OUTPATIENT_CLINIC_OR_DEPARTMENT_OTHER): Payer: Medicare HMO | Admitting: Oncology

## 2016-02-03 ENCOUNTER — Inpatient Hospital Stay: Payer: Medicare HMO

## 2016-02-03 VITALS — BP 149/95 | HR 72 | Temp 97.6°F | Resp 18 | Ht 67.0 in | Wt 189.2 lb

## 2016-02-03 DIAGNOSIS — K6289 Other specified diseases of anus and rectum: Secondary | ICD-10-CM | POA: Diagnosis not present

## 2016-02-03 DIAGNOSIS — F17210 Nicotine dependence, cigarettes, uncomplicated: Secondary | ICD-10-CM | POA: Diagnosis not present

## 2016-02-03 DIAGNOSIS — Z23 Encounter for immunization: Secondary | ICD-10-CM

## 2016-02-03 DIAGNOSIS — C7802 Secondary malignant neoplasm of left lung: Secondary | ICD-10-CM | POA: Insufficient documentation

## 2016-02-03 DIAGNOSIS — C20 Malignant neoplasm of rectum: Secondary | ICD-10-CM | POA: Diagnosis not present

## 2016-02-03 DIAGNOSIS — G629 Polyneuropathy, unspecified: Secondary | ICD-10-CM | POA: Insufficient documentation

## 2016-02-03 DIAGNOSIS — Z9221 Personal history of antineoplastic chemotherapy: Secondary | ICD-10-CM | POA: Diagnosis not present

## 2016-02-03 DIAGNOSIS — I10 Essential (primary) hypertension: Secondary | ICD-10-CM

## 2016-02-03 DIAGNOSIS — Z95828 Presence of other vascular implants and grafts: Secondary | ICD-10-CM

## 2016-02-03 LAB — COMPREHENSIVE METABOLIC PANEL
ALT: 25 U/L (ref 17–63)
AST: 30 U/L (ref 15–41)
Albumin: 4.1 g/dL (ref 3.5–5.0)
Alkaline Phosphatase: 55 U/L (ref 38–126)
Anion gap: 6 (ref 5–15)
BILIRUBIN TOTAL: 0.9 mg/dL (ref 0.3–1.2)
BUN: 19 mg/dL (ref 6–20)
CHLORIDE: 103 mmol/L (ref 101–111)
CO2: 22 mmol/L (ref 22–32)
Calcium: 8.6 mg/dL — ABNORMAL LOW (ref 8.9–10.3)
Creatinine, Ser: 1.42 mg/dL — ABNORMAL HIGH (ref 0.61–1.24)
GFR, EST AFRICAN AMERICAN: 57 mL/min — AB (ref >60)
GFR, EST NON AFRICAN AMERICAN: 49 mL/min — AB (ref >60)
Glucose, Bld: 102 mg/dL — ABNORMAL HIGH (ref 65–99)
POTASSIUM: 4.3 mmol/L (ref 3.5–5.1)
Sodium: 131 mmol/L — ABNORMAL LOW (ref 135–145)
TOTAL PROTEIN: 6.9 g/dL (ref 6.5–8.1)

## 2016-02-03 LAB — CBC WITH DIFFERENTIAL/PLATELET
BASOS ABS: 0.1 10*3/uL (ref 0–0.1)
Basophils Relative: 1 %
EOS ABS: 0.2 10*3/uL (ref 0–0.7)
EOS PCT: 3 %
HCT: 40.6 % (ref 40.0–52.0)
HEMOGLOBIN: 14.5 g/dL (ref 13.0–18.0)
LYMPHS ABS: 0.9 10*3/uL — AB (ref 1.0–3.6)
LYMPHS PCT: 11 %
MCH: 40.1 pg — ABNORMAL HIGH (ref 26.0–34.0)
MCHC: 35.6 g/dL (ref 32.0–36.0)
MCV: 112.6 fL — AB (ref 80.0–100.0)
Monocytes Absolute: 0.8 10*3/uL (ref 0.2–1.0)
Monocytes Relative: 11 %
NEUTROS PCT: 74 %
Neutro Abs: 5.7 10*3/uL (ref 1.4–6.5)
PLATELETS: 170 10*3/uL (ref 150–440)
RBC: 3.61 MIL/uL — AB (ref 4.40–5.90)
RDW: 14.8 % — ABNORMAL HIGH (ref 11.5–14.5)
WBC: 7.7 10*3/uL (ref 3.8–10.6)

## 2016-02-03 MED ORDER — SODIUM CHLORIDE 0.9% FLUSH
10.0000 mL | INTRAVENOUS | Status: DC | PRN
  Administered 2016-02-03: 10 mL via INTRAVENOUS
  Filled 2016-02-03: qty 10

## 2016-02-03 MED ORDER — HEPARIN SOD (PORK) LOCK FLUSH 100 UNIT/ML IV SOLN
500.0000 [IU] | Freq: Once | INTRAVENOUS | Status: AC
  Administered 2016-02-03: 500 [IU] via INTRAVENOUS

## 2016-02-03 NOTE — Progress Notes (Signed)
No changes since last visit. 

## 2016-02-03 NOTE — Progress Notes (Signed)
[place]  Telephone:([phone] Fax:(336) [phone]  ID: Toniann Fail OB: [birth year]  MR#: [id number]  YQI#:[id number]  Patient Care Team: [name], MD as PCP - General (Family Medicine)  CHIEF COMPLAINT:  Chief Complaint  Patient presents with  . Follow-up    rectal and lung cancer    INTERVAL HISTORY: Patient returns to clinic today for 3 month follow up with laboratory work and further evaluation. He continues to have a mild peripheral neuropathy and states it is slightly worse in the cold weather. It still does not affect his day-to-day life. He also still complains of rectal pain/pressure.  He does not complain of any further bleeding. He has no other neurologic complaints. He denies any chest pain or shortness of breath. He has a good appetite and denies weight loss. He denies any recent fevers. He denies any nausea, vomiting, constipation, or diarrhea. He has no melena or hematochezia. 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 Patient offers no further specific complaints today.  REVIEW OF SYSTEMS:   Review of Systems  Constitutional: Negative.  Negative for fever and malaise/fatigue.  Respiratory: Negative.  Negative for cough, hemoptysis and shortness of breath.   Cardiovascular: Negative.  Negative for chest pain.  Gastrointestinal:       Pelvic/rectal pain.  Neurological: Positive for sensory change. Negative for weakness.    As per HPI. Otherwise, a complete review of systems is negatve.  PAST MEDICAL HISTORY: Past Medical History  Diagnosis Date  . History of anemia   . History of chicken pox   . HTN (hypertension)   . Smoker   . Allergic rhinitis   . Rectal cancer metastasized to lung [facility]) 09-2013    surgery and chemo    PAST SURGICAL HISTORY: Past Surgical History  Procedure Laterality Date  . Varicose vein surgery  2010  . Tonsillectomy and adenoidectomy  Childhood  . Eus N/A 11/15/2013    Procedure: LOWER ENDOSCOPIC ULTRASOUND  (EUS);  Surgeon: Milus Banister, MD;  Location: [facility];  Service: Endoscopy;  Laterality: N/A;    FAMILY HISTORY Family History  Problem Relation Age of Onset  . Hypertension Mother   . Coronary artery disease Mother     angina  . Cancer Mother     breast  . Cancer Father 26    esophageal  . COPD Brother     emphysema  . Cancer Paternal Grandmother     colon  . Diabetes Neg Hx        ADVANCED DIRECTIVES:    HEALTH MAINTENANCE: Social History  Substance Use Topics  . Smoking status: Current Every Day Smoker -- 0.80 packs/day    Types: Cigarettes  . Smokeless tobacco: Never Used  . Alcohol Use: Yes     Comment: Regular 2 scotch/day     Allergies  Allergen Reactions  . No Known Allergies     Current Outpatient Prescriptions  Medication Sig Dispense Refill  . acetaminophen (TYLENOL) 500 MG tablet Take by mouth.    . loratadine (CLARITIN) 10 MG tablet Take 10 mg by mouth daily as needed.    Marland Kitchen olmesartan (BENICAR) 20 MG tablet Take 20 mg by mouth daily.    . polyethylene glycol (MIRALAX / GLYCOLAX) packet Take by mouth daily as needed.      No current facility-administered medications for this visit.   Facility-Administered Medications Ordered in Other Visits  Medication Dose Route Frequency Provider Last Rate Last Dose  . sodium chloride flush (NS) 0.9 %  injection 10 mL  10 mL Intravenous PRN Lloyd Huger, MD   10 mL at 02/03/16 1031    OBJECTIVE: Filed Vitals:   02/03/16 1054  BP: 149/95  Pulse: 72  Temp: 97.6 F (36.4 C)  Resp: 18     Body mass index is 29.62 kg/(m^2).    ECOG FS:0 - Asymptomatic  General: Well-developed, well-nourished, no acute distress. Eyes: anicteric sclera. Lungs: Clear to auscultation bilaterally. Heart: Regular rate and rhythm. No rubs, murmurs, or gallops. Abdomen: Soft, nontender, nondistended. No organomegaly noted, normoactive bowel sounds. Colostomy noted. Musculoskeletal: No edema, cyanosis, or  clubbing. Neuro: Alert, answering all questions appropriately. Cranial nerves grossly intact. Skin: No rashes or petechiae noted. Psych: Normal affect.   LAB RESULTS:  Lab Results  Component Value Date   NA 131* 02/03/2016   K 4.3 02/03/2016   CL 103 02/03/2016   CO2 22 02/03/2016   GLUCOSE 102* 02/03/2016   BUN 19 02/03/2016   CREATININE 1.42* 02/03/2016   CALCIUM 8.6* 02/03/2016   PROT 6.9 02/03/2016   ALBUMIN 4.1 02/03/2016   AST 30 02/03/2016   ALT 25 02/03/2016   ALKPHOS 55 02/03/2016   BILITOT 0.9 02/03/2016   GFRNONAA 49* 02/03/2016   GFRAA 57* 02/03/2016    Lab Results  Component Value Date   WBC 7.7 02/03/2016   NEUTROABS 5.7 02/03/2016   HGB 14.5 02/03/2016   HCT 40.6 02/03/2016   MCV 112.6* 02/03/2016   PLT 170 02/03/2016   Lab Results  Component Value Date   CEA 2.1 02/03/2016     STUDIES: No results found.  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   PLAN:    1. Rectal cancer: CEA is down from 2.5 and is now 2.1. Patient will RTC in 3 months with labs and a CT scan. Will reimage with CT every 6 months for approximately 2 years after completing his treatments which was on December 09, 2014.  2. Peripheral neuropathy: Unchanged. Secondary to chemotherapy. Patient was again offered gabapentin, but he wishes not to pursue treatment at this time. 3. Rectal Pain/Pressure: Unchanged. Secondary to surgery.  Patient expressed understanding and was in agreement with this plan. He also understands that He can call clinic at any time with any questions, concerns, or complaints.    Mayra Reel, NP   02/03/2016 11:29 AM  Patient was seen and evaluated independently and I agree with the assessment and plan as dictated above.  Lloyd Huger, MD 02/05/2016 6:07 AM

## 2016-02-04 LAB — CEA: CEA: 2.1 ng/mL (ref 0.0–4.7)

## 2016-03-16 ENCOUNTER — Inpatient Hospital Stay: Payer: Medicare HMO | Attending: Oncology

## 2016-03-16 DIAGNOSIS — C801 Malignant (primary) neoplasm, unspecified: Secondary | ICD-10-CM

## 2016-03-16 DIAGNOSIS — Z452 Encounter for adjustment and management of vascular access device: Secondary | ICD-10-CM | POA: Insufficient documentation

## 2016-03-16 DIAGNOSIS — C20 Malignant neoplasm of rectum: Secondary | ICD-10-CM | POA: Insufficient documentation

## 2016-03-16 MED ORDER — HEPARIN SOD (PORK) LOCK FLUSH 100 UNIT/ML IV SOLN
INTRAVENOUS | Status: AC
  Filled 2016-03-16: qty 5

## 2016-03-16 MED ORDER — SODIUM CHLORIDE 0.9% FLUSH
10.0000 mL | INTRAVENOUS | Status: DC | PRN
  Administered 2016-03-16: 10 mL via INTRAVENOUS
  Filled 2016-03-16: qty 10

## 2016-03-16 MED ORDER — HEPARIN SOD (PORK) LOCK FLUSH 100 UNIT/ML IV SOLN
500.0000 [IU] | Freq: Once | INTRAVENOUS | Status: AC
  Administered 2016-03-16: 500 [IU] via INTRAVENOUS

## 2016-05-04 ENCOUNTER — Other Ambulatory Visit: Payer: Medicare HMO

## 2016-05-04 ENCOUNTER — Ambulatory Visit: Payer: Medicare HMO | Admitting: Oncology

## 2016-05-05 ENCOUNTER — Inpatient Hospital Stay: Payer: Medicare HMO

## 2016-05-05 ENCOUNTER — Inpatient Hospital Stay: Payer: Medicare HMO | Attending: Oncology

## 2016-05-05 ENCOUNTER — Ambulatory Visit
Admission: RE | Admit: 2016-05-05 | Discharge: 2016-05-05 | Disposition: A | Discharge status: Home / Self Care | Payer: Medicare HMO | Source: Ambulatory Visit | Attending: Oncology | Admitting: Oncology

## 2016-05-05 DIAGNOSIS — Z933 Colostomy status: Secondary | ICD-10-CM | POA: Insufficient documentation

## 2016-05-05 DIAGNOSIS — I10 Essential (primary) hypertension: Secondary | ICD-10-CM | POA: Diagnosis not present

## 2016-05-05 DIAGNOSIS — G629 Polyneuropathy, unspecified: Secondary | ICD-10-CM | POA: Insufficient documentation

## 2016-05-05 DIAGNOSIS — Z85048 Personal history of other malignant neoplasm of rectum, rectosigmoid junction, and anus: Secondary | ICD-10-CM | POA: Insufficient documentation

## 2016-05-05 DIAGNOSIS — C20 Malignant neoplasm of rectum: Secondary | ICD-10-CM

## 2016-05-05 DIAGNOSIS — K6289 Other specified diseases of anus and rectum: Secondary | ICD-10-CM | POA: Insufficient documentation

## 2016-05-05 DIAGNOSIS — F17210 Nicotine dependence, cigarettes, uncomplicated: Secondary | ICD-10-CM | POA: Insufficient documentation

## 2016-05-05 DIAGNOSIS — Z95828 Presence of other vascular implants and grafts: Secondary | ICD-10-CM

## 2016-05-05 DIAGNOSIS — Z862 Personal history of diseases of the blood and blood-forming organs and certain disorders involving the immune mechanism: Secondary | ICD-10-CM | POA: Diagnosis not present

## 2016-05-05 DIAGNOSIS — I700 Atherosclerosis of aorta: Secondary | ICD-10-CM | POA: Insufficient documentation

## 2016-05-05 LAB — CBC WITH DIFFERENTIAL/PLATELET
BASOS ABS: 0.1 10*3/uL (ref 0–0.1)
Basophils Relative: 1 %
EOS ABS: 0.3 10*3/uL (ref 0–0.7)
Eosinophils Relative: 4 %
HCT: 39.3 % — ABNORMAL LOW (ref 40.0–52.0)
HEMOGLOBIN: 14 g/dL (ref 13.0–18.0)
LYMPHS ABS: 0.7 10*3/uL — AB (ref 1.0–3.6)
LYMPHS PCT: 11 %
MCH: 39 pg — AB (ref 26.0–34.0)
MCHC: 35.5 g/dL (ref 32.0–36.0)
MCV: 109.9 fL — AB (ref 80.0–100.0)
Monocytes Absolute: 0.7 10*3/uL (ref 0.2–1.0)
Monocytes Relative: 10 %
NEUTROS PCT: 74 %
Neutro Abs: 5 10*3/uL (ref 1.4–6.5)
Platelets: 177 10*3/uL (ref 150–440)
RBC: 3.58 MIL/uL — AB (ref 4.40–5.90)
RDW: 15.5 % — ABNORMAL HIGH (ref 11.5–14.5)
WBC: 6.7 10*3/uL (ref 3.8–10.6)

## 2016-05-05 LAB — COMPREHENSIVE METABOLIC PANEL
ALBUMIN: 3.9 g/dL (ref 3.5–5.0)
ALT: 18 U/L (ref 17–63)
AST: 27 U/L (ref 15–41)
Alkaline Phosphatase: 63 U/L (ref 38–126)
Anion gap: 5 (ref 5–15)
BUN: 11 mg/dL (ref 6–20)
CHLORIDE: 97 mmol/L — AB (ref 101–111)
CO2: 24 mmol/L (ref 22–32)
CREATININE: 1.18 mg/dL (ref 0.61–1.24)
Calcium: 8.7 mg/dL — ABNORMAL LOW (ref 8.9–10.3)
GFR calc Af Amer: >60 mL/min (ref >60)
GLUCOSE: 99 mg/dL (ref 65–99)
POTASSIUM: 4.4 mmol/L (ref 3.5–5.1)
Sodium: 126 mmol/L — ABNORMAL LOW (ref 135–145)
Total Bilirubin: 0.9 mg/dL (ref 0.3–1.2)
Total Protein: 6.8 g/dL (ref 6.5–8.1)

## 2016-05-05 MED ORDER — IOPAMIDOL (ISOVUE-300) INJECTION 61%
100.0000 mL | Freq: Once | INTRAVENOUS | Status: AC | PRN
  Administered 2016-05-05: 100 mL via INTRAVENOUS

## 2016-05-05 MED ORDER — SODIUM CHLORIDE 0.9% FLUSH
10.0000 mL | INTRAVENOUS | Status: DC | PRN
  Filled 2016-05-05: qty 10

## 2016-05-05 MED ORDER — HEPARIN SOD (PORK) LOCK FLUSH 100 UNIT/ML IV SOLN: 500.0000 [IU] | Freq: Once | INTRAVENOUS | Status: DC

## 2016-05-06 LAB — CEA: CEA: 2 ng/mL (ref 0.0–4.7)

## 2016-05-09 ENCOUNTER — Inpatient Hospital Stay (HOSPITAL_BASED_OUTPATIENT_CLINIC_OR_DEPARTMENT_OTHER): Payer: Medicare HMO | Admitting: Oncology

## 2016-05-09 VITALS — BP 146/88 | HR 84 | Temp 97.2°F | Wt 191.6 lb

## 2016-05-09 DIAGNOSIS — I10 Essential (primary) hypertension: Secondary | ICD-10-CM

## 2016-05-09 DIAGNOSIS — F17210 Nicotine dependence, cigarettes, uncomplicated: Secondary | ICD-10-CM

## 2016-05-09 DIAGNOSIS — Z933 Colostomy status: Secondary | ICD-10-CM

## 2016-05-09 DIAGNOSIS — Z85048 Personal history of other malignant neoplasm of rectum, rectosigmoid junction, and anus: Secondary | ICD-10-CM

## 2016-05-09 DIAGNOSIS — G629 Polyneuropathy, unspecified: Secondary | ICD-10-CM

## 2016-05-09 DIAGNOSIS — K6289 Other specified diseases of anus and rectum: Secondary | ICD-10-CM

## 2016-05-09 DIAGNOSIS — C20 Malignant neoplasm of rectum: Secondary | ICD-10-CM

## 2016-05-09 DIAGNOSIS — Z862 Personal history of diseases of the blood and blood-forming organs and certain disorders involving the immune mechanism: Secondary | ICD-10-CM

## 2016-05-09 NOTE — Progress Notes (Signed)
Patient ambulates without assistance, brought to exam room 7, accompanied by his wife.  Patient denies pain or discomfort, vital documented.  Medication record updated, information provided by patient.

## 2016-05-15 NOTE — Progress Notes (Signed)
[place]  Telephone:(336) [phone] Fax:(336) [phone]  ID: Toniann Fail OB: 1945-12-19  MR#: [id number]  KVQ#:[id number]  Patient Care Team: [name], MD as PCP - General (Family Medicine)  CHIEF COMPLAINT:  Chief Complaint  Patient presents with  . Follow-up    INTERVAL HISTORY: Patient returns to clinic today for routine follow-up, laboratory work, and discussion of his imaging results. He is upset today stating he had recent imaging in April with no communication of the results or follow-up at that time. He also has intermittent swelling surrounding his colostomy site that is not painful. He continues to have a mild peripheral neuropathy and states it is slightly worse in the cold weather. It still does not affect his day-to-day life. He also still complains of rectal pain/pressure.  He does not complain of any further bleeding. He has no other neurologic complaints. He denies any chest pain or shortness of breath. He has a good appetite and denies weight loss. He denies any recent fevers. He denies any nausea, vomiting, constipation, or diarrhea. He has no melena or hematochezia. 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 Patient offers no further specific complaints today.  REVIEW OF SYSTEMS:   Review of Systems  Constitutional: Negative.  Negative for fever and malaise/fatigue.  Respiratory: Negative.  Negative for cough, hemoptysis and shortness of breath.   Cardiovascular: Negative.  Negative for chest pain.  Gastrointestinal: Negative for nausea, vomiting, abdominal pain, blood in stool and melena.       Pelvic/rectal pain.  Genitourinary: Negative.   Musculoskeletal: Negative.   Neurological: Positive for sensory change. Negative for weakness.  Psychiatric/Behavioral: Negative.     As per HPI. Otherwise, a complete review of systems is negatve.  PAST MEDICAL HISTORY: Past Medical History  Diagnosis Date  . History of anemia   . History of chicken pox    . HTN (hypertension)   . Smoker   . Allergic rhinitis   . Rectal cancer metastasized to lung [facility]) 09-2013    surgery and chemo    PAST SURGICAL HISTORY: Past Surgical History  Procedure Laterality Date  . Varicose vein surgery  2010  . Tonsillectomy and adenoidectomy  Childhood  . Eus N/A 11/15/2013    Procedure: LOWER ENDOSCOPIC ULTRASOUND (EUS);  Surgeon: Milus Banister, MD;  Location: [facility];  Service: Endoscopy;  Laterality: N/A;    FAMILY HISTORY Family History  Problem Relation Age of Onset  . Hypertension Mother   . Coronary artery disease Mother     angina  . Cancer Mother     breast  . Cancer Father 58    esophageal  . COPD Brother     emphysema  . Cancer Paternal Grandmother     colon  . Diabetes Neg Hx        ADVANCED DIRECTIVES:    HEALTH MAINTENANCE: Social History  Substance Use Topics  . Smoking status: Current Every Day Smoker -- 0.80 packs/day    Types: Cigarettes  . Smokeless tobacco: Never Used  . Alcohol Use: Yes     Comment: Regular 2 scotch/day     Allergies  Allergen Reactions  . No Known Allergies     Current Outpatient Prescriptions  Medication Sig Dispense Refill  . acetaminophen (TYLENOL) 500 MG tablet Take by mouth.    . loratadine (CLARITIN) 10 MG tablet Take 10 mg by mouth daily as needed.    Marland Kitchen olmesartan (BENICAR) 20 MG tablet Take 20 mg by mouth daily.    Marland Kitchen  polyethylene glycol (MIRALAX / GLYCOLAX) packet Take by mouth daily as needed.      No current facility-administered medications for this visit.    OBJECTIVE: Filed Vitals:   05/09/16 1054  BP: 146/88  Pulse: 84  Temp: 97.2 F (36.2 C)     Body mass index is 30 kg/(m^2).    ECOG FS:0 - Asymptomatic  General: Well-developed, well-nourished, no acute distress. Eyes: anicteric sclera. Lungs: Clear to auscultation bilaterally. Heart: Regular rate and rhythm. No rubs, murmurs, or gallops. Abdomen: Soft, nontender, nondistended. No organomegaly noted,  normoactive bowel sounds. Colostomy noted. Musculoskeletal: No edema, cyanosis, or clubbing. Neuro: Alert, answering all questions appropriately. Cranial nerves grossly intact. Skin: No rashes or petechiae noted. Psych: Normal affect.   LAB RESULTS:  Lab Results  Component Value Date   NA 126* 05/05/2016   K 4.4 05/05/2016   CL 97* 05/05/2016   CO2 24 05/05/2016   GLUCOSE 99 05/05/2016   BUN 11 05/05/2016   CREATININE 1.18 05/05/2016   CALCIUM 8.7* 05/05/2016   PROT 6.8 05/05/2016   ALBUMIN 3.9 05/05/2016   AST 27 05/05/2016   ALT 18 05/05/2016   ALKPHOS 63 05/05/2016   BILITOT 0.9 05/05/2016   GFRNONAA >60 05/05/2016   GFRAA >60 05/05/2016    Lab Results  Component Value Date   WBC 6.7 05/05/2016   NEUTROABS 5.0 05/05/2016   HGB 14.0 05/05/2016   HCT 39.3* 05/05/2016   MCV 109.9* 05/05/2016   PLT 177 05/05/2016   Lab Results  Component Value Date   CEA 2.0 05/05/2016     STUDIES: Ct Abdomen Pelvis W Contrast  05/05/2016  CLINICAL DATA:  Restaging rectal cancer. History of metastatic pulmonary disease. Patient is complaining of left lower quadrant abdominal pain for 10 days. EXAM: CT ABDOMEN AND PELVIS WITH CONTRAST TECHNIQUE: Multidetector CT imaging of the abdomen and pelvis was performed using the standard protocol following bolus administration of intravenous contrast. CONTRAST:  135m ISOVUE-300 IOPAMIDOL (ISOVUE-300) INJECTION 61% COMPARISON:  11/18/2015. FINDINGS: Lower chest: The lung bases are clear. Peripheral interstitial changes could reflect early interstitial lung disease but no worrisome pulmonary nodules or pleural effusion. The heart is normal in size. Stable coronary artery calcifications. Small hiatal hernia noted. Hepatobiliary: No focal hepatic lesions to suggest metastatic disease. The portal and hepatic veins are patent. The gallbladder is normal. No common bile duct dilatation. Pancreas: No mass, inflammation or ductal dilatation. Spleen: Normal  size.  No focal lesions. Adrenals/Urinary Tract: Stable bilateral adrenal gland nodules. These are likely benign adenomas. Stable renal cysts. No worrisome renal lesions. No hydroureteronephrosis. No significant bladder abnormality. Stomach/Bowel: The stomach, duodenum, small bowel and colon are unremarkable. Stable surgical changes related to rectosigmoid resection and left lower quadrant colostomy. Expected postoperative changes in the presacral/rectal area. No findings to suggest residual or recurrent tumor. Vascular/Lymphatic: No mesenteric or retroperitoneal mass or adenopathy. Stable advanced atherosclerotic calcifications involving the aorta and branch vessels. No aneurysm or dissection. Other: Stable bilateral inguinal hernias containing fat. No pelvic mass or free pelvic fluid collections. No subcutaneous lesions. No ascites. Musculoskeletal: No significant bony findings. IMPRESSION: 1. Stable surgical changes related to prior the rectal cancer resection with left lower quadrant colostomy. No CT findings suspicious for recurrent tumor or metastatic disease. 2. Stable advanced atherosclerotic calcifications involving the aorta and branch vessels and coronary arteries. 3. Stable bilateral adrenal gland nodules consistent with benign adenomas. Electronically Signed   By: PMarijo SanesM.D.   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   PLAN:    1. Rectal cancer: CT scan results from May 05, 2016 reviewed independently and reported as above with no obvious evidence of recurrence. Patient had metastatic disease in the chest, therefore will have to schedule a chest CT in the near future to further evaluate.  CEA continues to be within normal limits at 2.0. Will reimage with CT every 6 months for approximately 2 years after completing his treatments which was on December 09, 2014. Return to clinic in 3 months for laboratory  work only and then in 6 months for CT of the chest, abdomen, and pelvis. Patient will follow-up 1-2 days after his imaging to discuss the results. 2. Peripheral neuropathy: Unchanged. Secondary to chemotherapy. Patient was again offered gabapentin, but he wishes not to pursue treatment at this time. 3. Rectal Pain/Pressure: Unchanged. Secondary to surgery. 4. Intermittent swelling surrounding colostomy: Likely reducible hernia. This is not painful or affect in bowel movements, therefore surgical intervention is likely unnecessary. Continue to monitor. 5. Imaging in April 2017: Patient's chart was reviewed extensively with no documentation of CT scan, clinic note, or communication in April 2017. There is no documentation from his primary care physician or surgery.  Patient states he has the paperwork at home to confirm. Because there is no evidence in his electronic medical record at this time, patient will bring in his records to help resolve this issue.  Patient expressed understanding and was in agreement with this plan. He also understands that He can call clinic at any time with any questions, concerns, or complaints.   Approximately 30 minutes was spent in discussion of which greater than 50% was consultation.   Lloyd Huger, MD   05/15/2016 7:56 AM

## 2016-05-19 ENCOUNTER — Ambulatory Visit
Admission: RE | Admit: 2016-05-19 | Discharge: 2016-05-19 | Disposition: A | Discharge status: Home / Self Care | Payer: Medicare HMO | Source: Ambulatory Visit | Attending: Oncology | Admitting: Oncology

## 2016-05-19 DIAGNOSIS — C20 Malignant neoplasm of rectum: Secondary | ICD-10-CM | POA: Insufficient documentation

## 2016-05-19 DIAGNOSIS — R911 Solitary pulmonary nodule: Secondary | ICD-10-CM | POA: Diagnosis not present

## 2016-05-19 DIAGNOSIS — I2510 Atherosclerotic heart disease of native coronary artery without angina pectoris: Secondary | ICD-10-CM | POA: Diagnosis not present

## 2016-05-19 MED ORDER — IOPAMIDOL (ISOVUE-300) INJECTION 61%
75.0000 mL | Freq: Once | INTRAVENOUS | Status: AC | PRN
  Administered 2016-05-19: 75 mL via INTRAVENOUS

## 2016-05-23 ENCOUNTER — Telehealth: Payer: Self-pay | Admitting: *Deleted

## 2016-05-23 NOTE — Telephone Encounter (Signed)
Per Dr. Grayland Ormond CT Chest looks good, patient notified of results. Patient to keep current follow up scheduled for December 2017. Patient verbalized understanding of plan.

## 2016-06-16 ENCOUNTER — Inpatient Hospital Stay: Payer: Medicare HMO | Attending: Oncology

## 2016-06-16 DIAGNOSIS — Z85048 Personal history of other malignant neoplasm of rectum, rectosigmoid junction, and anus: Secondary | ICD-10-CM | POA: Diagnosis not present

## 2016-06-16 DIAGNOSIS — Z452 Encounter for adjustment and management of vascular access device: Secondary | ICD-10-CM | POA: Insufficient documentation

## 2016-06-16 DIAGNOSIS — C801 Malignant (primary) neoplasm, unspecified: Secondary | ICD-10-CM

## 2016-06-16 MED ORDER — SODIUM CHLORIDE 0.9% FLUSH
10.0000 mL | INTRAVENOUS | Status: DC | PRN
  Administered 2016-06-16: 10 mL via INTRAVENOUS
  Filled 2016-06-16: qty 10

## 2016-06-16 MED ORDER — HEPARIN SOD (PORK) LOCK FLUSH 100 UNIT/ML IV SOLN
500.0000 [IU] | Freq: Once | INTRAVENOUS | Status: AC
  Administered 2016-06-16: 500 [IU] via INTRAVENOUS
  Filled 2016-06-16: qty 5

## 2016-06-24 ENCOUNTER — Ambulatory Visit: Payer: Medicare HMO | Attending: Radiation Oncology | Admitting: Radiation Oncology

## 2016-07-28 ENCOUNTER — Inpatient Hospital Stay: Payer: Medicare HMO | Attending: Oncology

## 2016-07-28 DIAGNOSIS — Z85048 Personal history of other malignant neoplasm of rectum, rectosigmoid junction, and anus: Secondary | ICD-10-CM | POA: Diagnosis present

## 2016-07-28 DIAGNOSIS — Z452 Encounter for adjustment and management of vascular access device: Secondary | ICD-10-CM | POA: Insufficient documentation

## 2016-07-28 DIAGNOSIS — C801 Malignant (primary) neoplasm, unspecified: Secondary | ICD-10-CM

## 2016-07-28 MED ORDER — HEPARIN SOD (PORK) LOCK FLUSH 100 UNIT/ML IV SOLN
500.0000 [IU] | Freq: Once | INTRAVENOUS | Status: AC
  Administered 2016-07-28: 500 [IU] via INTRAVENOUS
  Filled 2016-07-28: qty 5

## 2016-07-28 MED ORDER — SODIUM CHLORIDE 0.9 % IJ SOLN
10.0000 mL | Freq: Once | INTRAMUSCULAR | Status: AC
  Administered 2016-07-28: 10 mL via INTRAVENOUS
  Filled 2016-07-28: qty 10

## 2016-08-01 IMAGING — CT CT ABD-PELV W/ CM
2 of 5 series · 16 of 46 positions shown, 18 images · IV contrast (omnipaque)
Comparison: 07/30/2015

CLINICAL DATA: colorectal cancer and left lower lobe lung cancer in
9904 with partial bowel resection and left lower lobe lung
resection. Chemotherapy and radiation therapy. Rectal pain and
pressure since surgery.

EXAM:
CT ABDOMEN AND PELVIS WITH CONTRAST
TECHNIQUE: Multidetector CT imaging of the abdomen and pelvis was performed
using the standard protocol following bolus administration of
intravenous contrast.
CONTRAST:  80mL OMNIPAQUE IOHEXOL 300 MG/ML  SOLN

[Series 2: routine with · axial · 0.75mm/px · z∈[-1124,-709]mm · 13 of 95 slices shown, 15 images]
[im 6/95  soft-tissue]
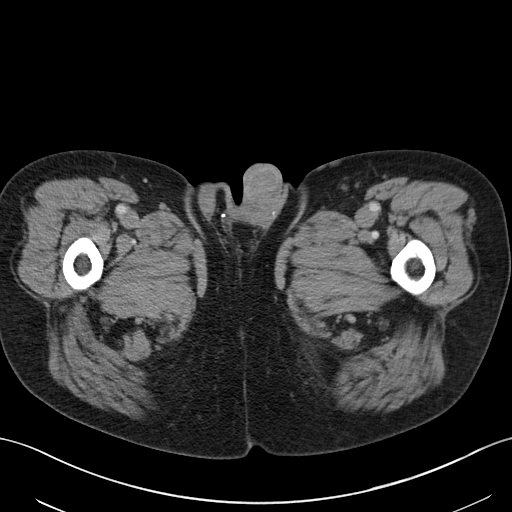
[im 6/95  bone]
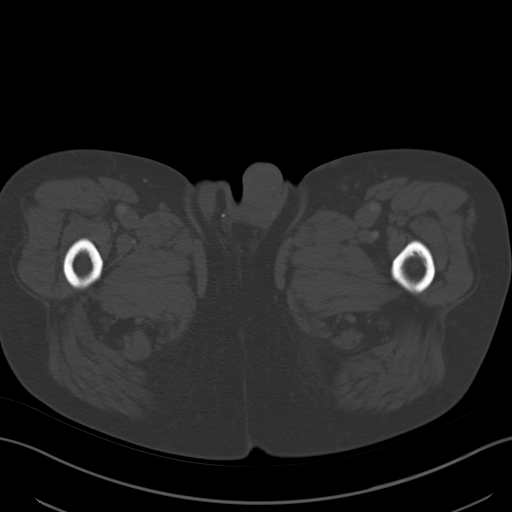
[im 11/95  soft-tissue]
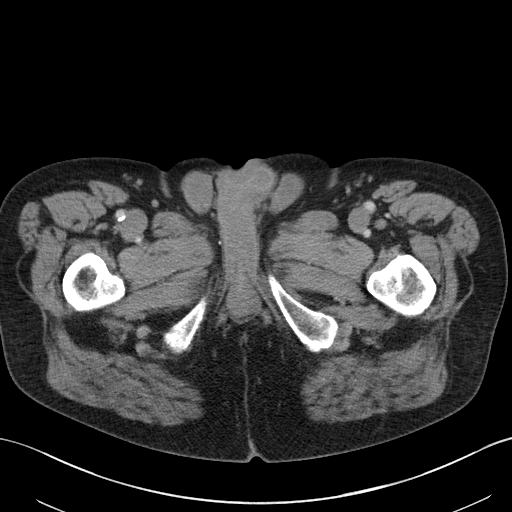
[im 21/95  soft-tissue]
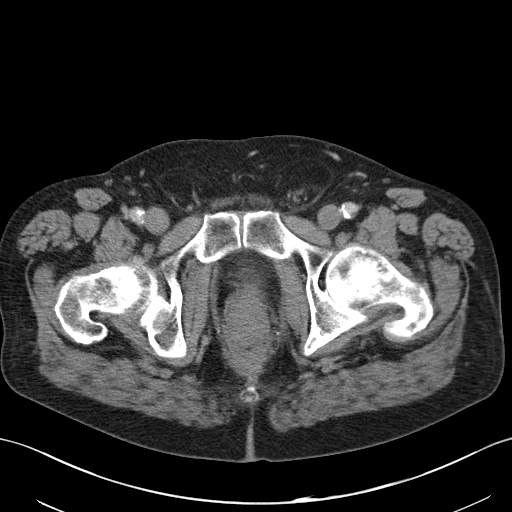
[im 27/95  soft-tissue]
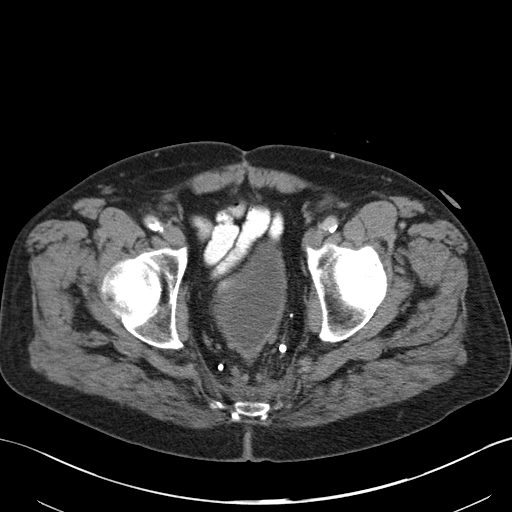
[im 32/95  soft-tissue]
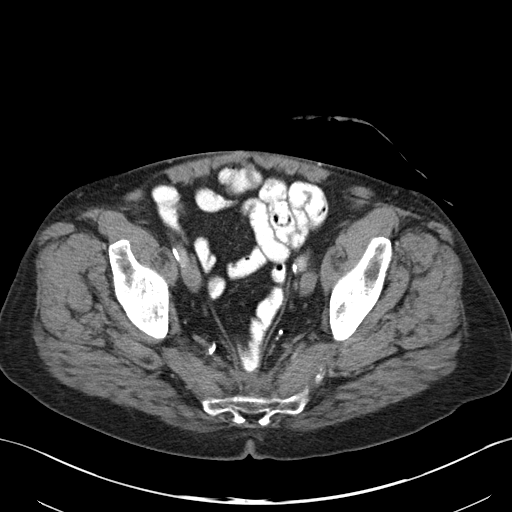
[im 42/95  soft-tissue]
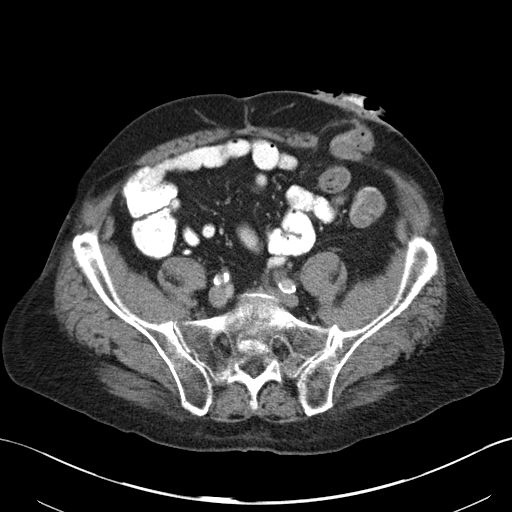
[im 48/95  soft-tissue]
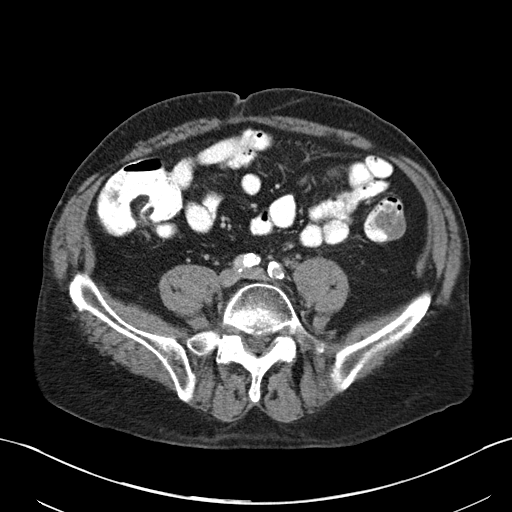
[im 53/95  soft-tissue]
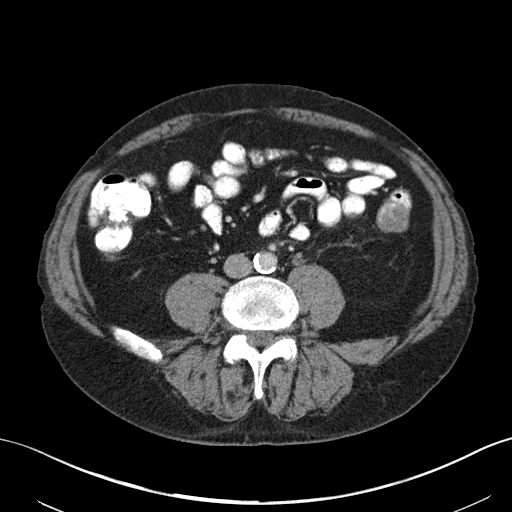
[im 63/95  soft-tissue]
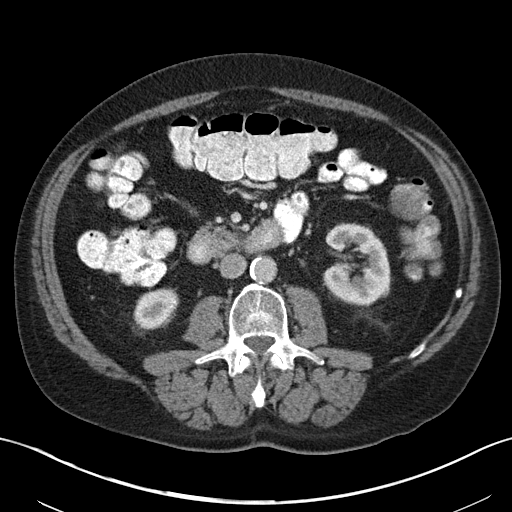
[im 63/95  bone]
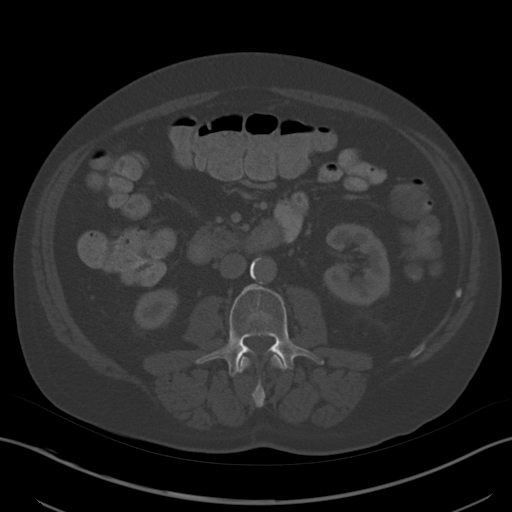
[im 68/95  soft-tissue]
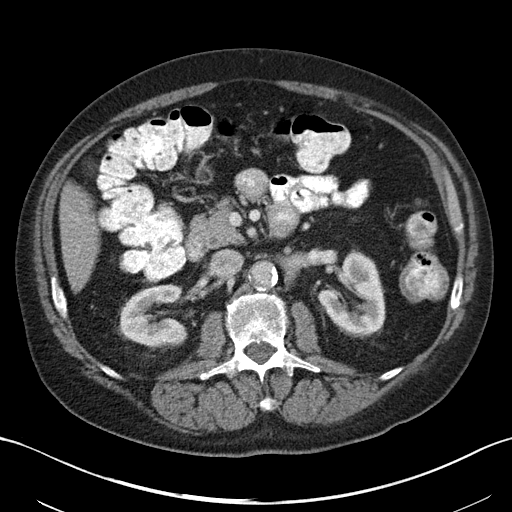
[im 74/95  soft-tissue]
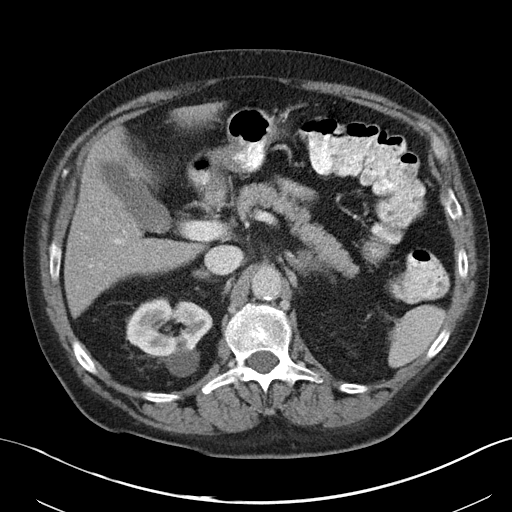
[im 84/95  soft-tissue]
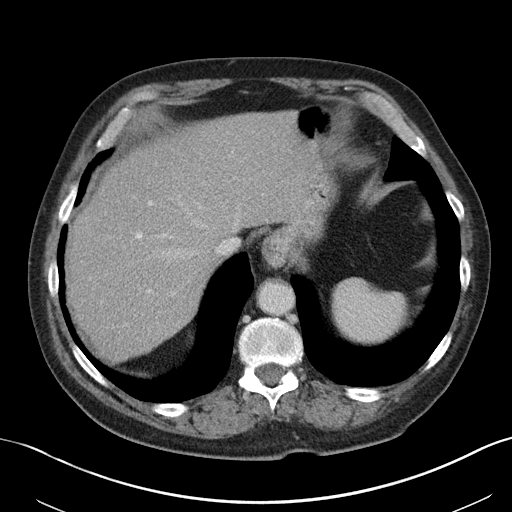
[im 89/95  soft-tissue]
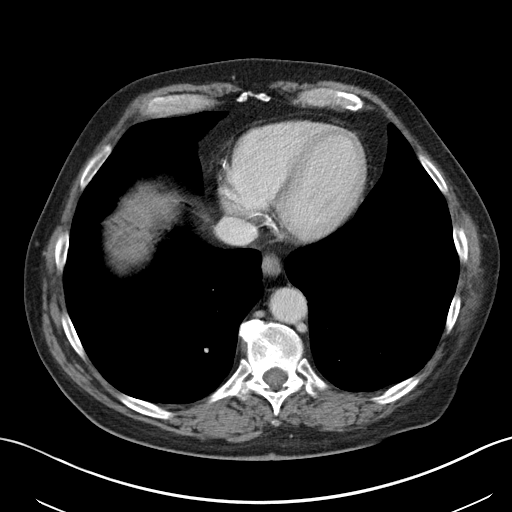

[Series 5: cor routine with · coronal · 0.77mm/px · 3 of 151 slices shown]
[im 51/151  soft-tissue]
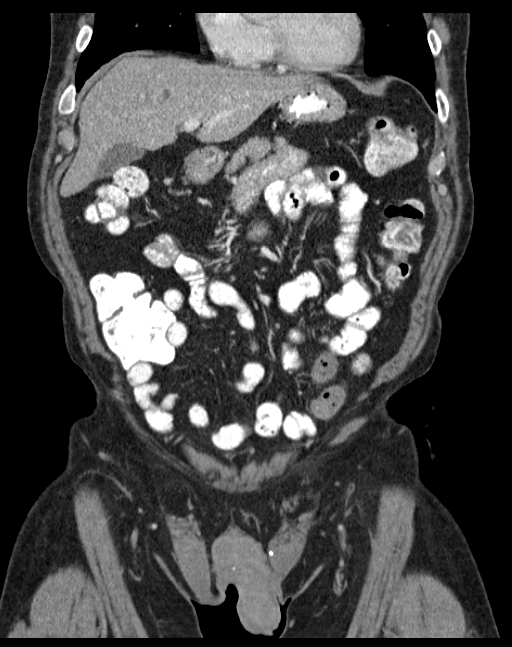
[im 67/151  soft-tissue]
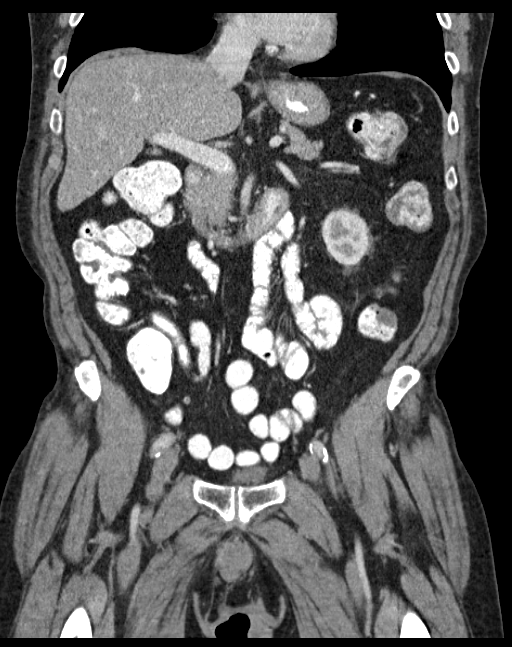
[im 84/151  soft-tissue]
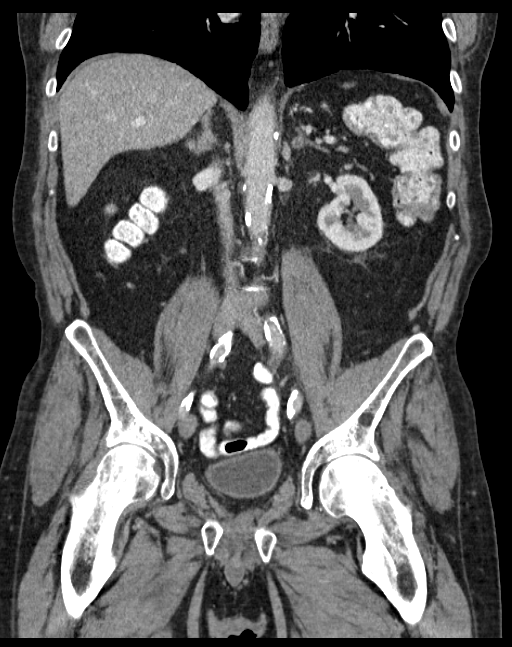

[16 of 46 positions shown; findings below may reference images not displayed]

FINDINGS: Lower chest: Clear lung bases. Normal heart size without pericardial
or pleural effusion. Tiny hiatal hernia. Right coronary artery
atherosclerosis.

Hepatobiliary: Too small to characterize lesions in the left lobe of
the liver are unchanged and likely cysts. Normal gallbladder,
without biliary ductal dilatation.

Pancreas: Normal, without mass or ductal dilatation.

Spleen: Normal in size, without focal abnormality.

Adrenals/Urinary Tract: Right adrenal thickening and left adrenal
nodularity are similar, including back to 10/31/2013.

Bilateral mild renal cortical thinning. An interpolar right renal
cyst. Too small to characterize upper pole right renal lesion. No
hydronephrosis. Normal urinary bladder.

Stomach/Bowel: Normal remainder of the stomach. Status post left
lower quadrant end colostomy. Normal terminal ileum and appendix.
Normal small bowel.

Vascular/Lymphatic: Aortic and branch vessel atherosclerosis. No
abdominopelvic adenopathy.

Reproductive: Normal prostate.

Other: No significant free fluid. Similar appearance of presacral
fascial thickening, likely postoperative and treatment related.
Resultant posterior displacement of the urinary bladder and prostate
presumably secondary to scarring. No evidence of locally recurrent
mass.

No evidence of omental or peritoneal disease.

Musculoskeletal: No acute osseous abnormality.
IMPRESSION: 1. Status post descending colostomy, without evidence of recurrent
or metastatic disease.
2. similar presacral fascial thickening, without locally recurrent
disease.
3.  Atherosclerosis, including within the coronary arteries.

## 2016-08-09 ENCOUNTER — Inpatient Hospital Stay: Payer: Medicare HMO

## 2016-08-09 DIAGNOSIS — Z85048 Personal history of other malignant neoplasm of rectum, rectosigmoid junction, and anus: Secondary | ICD-10-CM | POA: Diagnosis not present

## 2016-08-09 DIAGNOSIS — C20 Malignant neoplasm of rectum: Secondary | ICD-10-CM

## 2016-08-09 LAB — CBC WITH DIFFERENTIAL/PLATELET
BASOS ABS: 0.1 10*3/uL (ref 0–0.1)
Basophils Relative: 1 %
EOS PCT: 4 %
Eosinophils Absolute: 0.2 10*3/uL (ref 0–0.7)
HEMATOCRIT: 39.5 % — AB (ref 40.0–52.0)
Hemoglobin: 14.1 g/dL (ref 13.0–18.0)
LYMPHS ABS: 0.8 10*3/uL — AB (ref 1.0–3.6)
LYMPHS PCT: 14 %
MCH: 41.2 pg — AB (ref 26.0–34.0)
MCHC: 35.8 g/dL (ref 32.0–36.0)
MCV: 115.1 fL — AB (ref 80.0–100.0)
MONO ABS: 0.8 10*3/uL (ref 0.2–1.0)
Monocytes Relative: 13 %
NEUTROS ABS: 4.2 10*3/uL (ref 1.4–6.5)
Neutrophils Relative %: 68 %
Platelets: 202 10*3/uL (ref 150–440)
RBC: 3.43 MIL/uL — AB (ref 4.40–5.90)
RDW: 17.4 % — ABNORMAL HIGH (ref 11.5–14.5)
WBC: 6.1 10*3/uL (ref 3.8–10.6)

## 2016-08-09 LAB — COMPREHENSIVE METABOLIC PANEL
ALT: 18 U/L (ref 17–63)
AST: 29 U/L (ref 15–41)
Albumin: 4.1 g/dL (ref 3.5–5.0)
Alkaline Phosphatase: 64 U/L (ref 38–126)
Anion gap: 8 (ref 5–15)
BILIRUBIN TOTAL: 0.9 mg/dL (ref 0.3–1.2)
BUN: 15 mg/dL (ref 6–20)
CALCIUM: 9 mg/dL (ref 8.9–10.3)
CO2: 21 mmol/L — ABNORMAL LOW (ref 22–32)
CREATININE: 1.37 mg/dL — AB (ref 0.61–1.24)
Chloride: 100 mmol/L — ABNORMAL LOW (ref 101–111)
GFR, EST AFRICAN AMERICAN: 59 mL/min — AB (ref >60)
GFR, EST NON AFRICAN AMERICAN: 51 mL/min — AB (ref >60)
Glucose, Bld: 125 mg/dL — ABNORMAL HIGH (ref 65–99)
Potassium: 4.4 mmol/L (ref 3.5–5.1)
Sodium: 129 mmol/L — ABNORMAL LOW (ref 135–145)
TOTAL PROTEIN: 7.2 g/dL (ref 6.5–8.1)

## 2016-08-10 LAB — CEA: CEA: 2.9 ng/mL (ref 0.0–4.7)

## 2016-09-08 ENCOUNTER — Inpatient Hospital Stay: Payer: Medicare HMO | Attending: Oncology

## 2016-09-08 DIAGNOSIS — Z452 Encounter for adjustment and management of vascular access device: Secondary | ICD-10-CM | POA: Insufficient documentation

## 2016-09-08 DIAGNOSIS — Z95828 Presence of other vascular implants and grafts: Secondary | ICD-10-CM

## 2016-09-08 DIAGNOSIS — Z85048 Personal history of other malignant neoplasm of rectum, rectosigmoid junction, and anus: Secondary | ICD-10-CM | POA: Diagnosis not present

## 2016-09-08 MED ORDER — SODIUM CHLORIDE 0.9% FLUSH
10.0000 mL | INTRAVENOUS | Status: DC | PRN
  Administered 2016-09-08: 10 mL via INTRAVENOUS
  Filled 2016-09-08: qty 10

## 2016-09-08 MED ORDER — HEPARIN SOD (PORK) LOCK FLUSH 100 UNIT/ML IV SOLN
500.0000 [IU] | Freq: Once | INTRAVENOUS | Status: AC
  Administered 2016-09-08: 500 [IU] via INTRAVENOUS

## 2016-10-18 ENCOUNTER — Ambulatory Visit
Admission: RE | Admit: 2016-10-18 | Discharge: 2016-10-18 | Disposition: A | Discharge status: Home / Self Care | Payer: Medicare HMO | Source: Ambulatory Visit | Attending: Oncology | Admitting: Oncology

## 2016-10-18 ENCOUNTER — Inpatient Hospital Stay: Payer: Medicare HMO

## 2016-10-18 ENCOUNTER — Inpatient Hospital Stay: Payer: Medicare HMO | Attending: Oncology

## 2016-10-18 DIAGNOSIS — Z95828 Presence of other vascular implants and grafts: Secondary | ICD-10-CM

## 2016-10-18 DIAGNOSIS — Z933 Colostomy status: Secondary | ICD-10-CM | POA: Insufficient documentation

## 2016-10-18 DIAGNOSIS — C20 Malignant neoplasm of rectum: Secondary | ICD-10-CM

## 2016-10-18 DIAGNOSIS — Z803 Family history of malignant neoplasm of breast: Secondary | ICD-10-CM | POA: Insufficient documentation

## 2016-10-18 DIAGNOSIS — Z800 Family history of malignant neoplasm of digestive organs: Secondary | ICD-10-CM | POA: Insufficient documentation

## 2016-10-18 DIAGNOSIS — Z9889 Other specified postprocedural states: Secondary | ICD-10-CM | POA: Diagnosis not present

## 2016-10-18 DIAGNOSIS — R7989 Other specified abnormal findings of blood chemistry: Secondary | ICD-10-CM | POA: Diagnosis not present

## 2016-10-18 DIAGNOSIS — I10 Essential (primary) hypertension: Secondary | ICD-10-CM | POA: Diagnosis not present

## 2016-10-18 DIAGNOSIS — I2510 Atherosclerotic heart disease of native coronary artery without angina pectoris: Secondary | ICD-10-CM | POA: Diagnosis not present

## 2016-10-18 DIAGNOSIS — Z85048 Personal history of other malignant neoplasm of rectum, rectosigmoid junction, and anus: Secondary | ICD-10-CM | POA: Insufficient documentation

## 2016-10-18 DIAGNOSIS — G629 Polyneuropathy, unspecified: Secondary | ICD-10-CM | POA: Diagnosis not present

## 2016-10-18 DIAGNOSIS — Z79899 Other long term (current) drug therapy: Secondary | ICD-10-CM | POA: Diagnosis not present

## 2016-10-18 DIAGNOSIS — Z9221 Personal history of antineoplastic chemotherapy: Secondary | ICD-10-CM | POA: Insufficient documentation

## 2016-10-18 DIAGNOSIS — I700 Atherosclerosis of aorta: Secondary | ICD-10-CM | POA: Insufficient documentation

## 2016-10-18 DIAGNOSIS — K6289 Other specified diseases of anus and rectum: Secondary | ICD-10-CM | POA: Diagnosis not present

## 2016-10-18 DIAGNOSIS — F17210 Nicotine dependence, cigarettes, uncomplicated: Secondary | ICD-10-CM | POA: Diagnosis not present

## 2016-10-18 DIAGNOSIS — Z862 Personal history of diseases of the blood and blood-forming organs and certain disorders involving the immune mechanism: Secondary | ICD-10-CM | POA: Insufficient documentation

## 2016-10-18 LAB — CBC WITH DIFFERENTIAL/PLATELET
BASOS PCT: 1 %
Basophils Absolute: 0.1 10*3/uL (ref 0–0.1)
Eosinophils Absolute: 0.2 10*3/uL (ref 0–0.7)
Eosinophils Relative: 3 %
HEMATOCRIT: 40.3 % (ref 40.0–52.0)
HEMOGLOBIN: 14.1 g/dL (ref 13.0–18.0)
LYMPHS ABS: 0.9 10*3/uL — AB (ref 1.0–3.6)
Lymphocytes Relative: 10 %
MCH: 41.6 pg — AB (ref 26.0–34.0)
MCHC: 35 g/dL (ref 32.0–36.0)
MCV: 118.9 fL — ABNORMAL HIGH (ref 80.0–100.0)
MONO ABS: 0.7 10*3/uL (ref 0.2–1.0)
MONOS PCT: 9 %
NEUTROS ABS: 6.4 10*3/uL (ref 1.4–6.5)
Neutrophils Relative %: 77 %
Platelets: 180 10*3/uL (ref 150–440)
RBC: 3.39 MIL/uL — ABNORMAL LOW (ref 4.40–5.90)
RDW: 16.5 % — AB (ref 11.5–14.5)
WBC: 8.3 10*3/uL (ref 3.8–10.6)

## 2016-10-18 LAB — COMPREHENSIVE METABOLIC PANEL
ALBUMIN: 4.1 g/dL (ref 3.5–5.0)
ALK PHOS: 47 U/L (ref 38–126)
ALT: 20 U/L (ref 17–63)
ANION GAP: 8 (ref 5–15)
AST: 31 U/L (ref 15–41)
BILIRUBIN TOTAL: 0.7 mg/dL (ref 0.3–1.2)
BUN: 18 mg/dL (ref 6–20)
CALCIUM: 9.1 mg/dL (ref 8.9–10.3)
CO2: 22 mmol/L (ref 22–32)
Chloride: 102 mmol/L (ref 101–111)
Creatinine, Ser: 1.3 mg/dL — ABNORMAL HIGH (ref 0.61–1.24)
GFR, EST NON AFRICAN AMERICAN: 54 mL/min — AB (ref >60)
GLUCOSE: 102 mg/dL — AB (ref 65–99)
POTASSIUM: 4.7 mmol/L (ref 3.5–5.1)
Sodium: 132 mmol/L — ABNORMAL LOW (ref 135–145)
TOTAL PROTEIN: 7.3 g/dL (ref 6.5–8.1)

## 2016-10-18 MED ORDER — SODIUM CHLORIDE 0.9% FLUSH
10.0000 mL | INTRAVENOUS | Status: DC | PRN
  Administered 2016-10-18: 10 mL via INTRAVENOUS
  Filled 2016-10-18: qty 10

## 2016-10-18 MED ORDER — IOPAMIDOL (ISOVUE-300) INJECTION 61%
100.0000 mL | Freq: Once | INTRAVENOUS | Status: AC | PRN
  Administered 2016-10-18: 100 mL via INTRAVENOUS

## 2016-10-18 MED ORDER — HEPARIN SOD (PORK) LOCK FLUSH 100 UNIT/ML IV SOLN
500.0000 [IU] | Freq: Once | INTRAVENOUS | Status: AC
  Administered 2016-10-18: 500 [IU] via INTRAVENOUS

## 2016-10-19 LAB — CEA: CEA: 2.5 ng/mL (ref 0.0–4.7)

## 2016-10-19 NOTE — Progress Notes (Signed)
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 K-ras mutation positive.  INTERVAL HISTORY: Patient returns to clinic today for routine follow-up, laboratory work, and discussion of his imaging results. He currently feels well and is at his baseline. He continues to have a mild peripheral neuropathy and states it is slightly worse in the cold weather. It still does not affect his day-to-day life. He also still complains of rectal pain/pressure which is unchanged.  He does not complain of any further bleeding. He has no other neurologic complaints. He denies any chest pain or shortness of breath. He has a good appetite and denies weight loss. He denies any recent fevers. He denies any nausea, vomiting, constipation, or diarrhea. He has no melena or hematochezia. 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 Patient offers no further specific complaints today.  REVIEW OF SYSTEMS:   Review of Systems  Constitutional: Negative.  Negative for fever and malaise/fatigue.  Respiratory: Negative.  Negative for cough, hemoptysis and shortness of breath.   Cardiovascular: Negative.  Negative for chest pain.  Gastrointestinal: Negative for abdominal pain, blood in stool, melena, nausea and vomiting.       Pelvic/rectal pain.  Genitourinary: Negative.   Musculoskeletal: Negative.   Neurological: Positive for sensory change. Negative for weakness.  Psychiatric/Behavioral: Negative.     As per HPI. Otherwise, a complete review of systems is negative.  PAST MEDICAL HISTORY: Past Medical History:  Diagnosis Date  . Allergic rhinitis   . History of anemia   . History of chicken pox   . HTN (hypertension)   . Rectal cancer metastasized to lung [facility]) 09-2013   surgery and chemo  . Smoker     PAST SURGICAL HISTORY: Past Surgical History:  Procedure Laterality Date  . EUS N/A 11/15/2013   Procedure: LOWER ENDOSCOPIC ULTRASOUND (EUS);  Surgeon: Rachael Fee, MD;  Location: [facility];  Service: Endoscopy;  Laterality: N/A;  . TONSILLECTOMY AND ADENOIDECTOMY  Childhood  . VARICOSE VEIN SURGERY  2010    FAMILY HISTORY Family History  Problem Relation Age of Onset  . Hypertension Mother   . Coronary artery disease Mother     angina  . Cancer Mother     breast  . Cancer Father 38    esophageal  . COPD Brother     emphysema  . Cancer Paternal Grandmother     colon  . Diabetes Neg Hx        ADVANCED DIRECTIVES:    HEALTH MAINTENANCE: Social History  Substance Use Topics  . Smoking status: Current Every Day Smoker    Packs/day: 0.80    Types: Cigarettes  . Smokeless tobacco: Never Used  . Alcohol use Yes     Comment: Regular 2 scotch/day     Allergies  Allergen Reactions  . No Known Allergies     Current Outpatient Prescriptions  Medication Sig Dispense Refill  . acetaminophen (TYLENOL) 500 MG tablet Take by mouth.    . loratadine (CLARITIN) 10 MG tablet Take 10 mg by mouth daily as needed.    Marland Kitchen olmesartan (BENICAR) 20 MG tablet Take 20 mg by mouth daily.    . polyethylene glycol (MIRALAX / GLYCOLAX) packet Take by mouth daily as needed.      No current facility-administered medications for  this visit.     OBJECTIVE: Vitals:   10/20/16 1002  BP: 127/86  Pulse: 94  Resp: 18  Temp: 98.6 F (37 C)     Body mass index is 30.92 kg/m.    ECOG FS:0 - Asymptomatic  General: Well-developed, well-nourished, no acute distress. Eyes: anicteric sclera. Lungs: Clear to auscultation bilaterally. Heart: Regular rate and rhythm. No rubs, murmurs, or gallops. Abdomen: Soft, nontender, nondistended. Normoactive bowel sounds. Colostomy noted. Musculoskeletal: No edema, cyanosis, or clubbing. Neuro: Alert, answering all  questions appropriately. Cranial nerves grossly intact. Skin: No rashes or petechiae noted. Psych: Normal affect.   LAB RESULTS:  Lab Results  Component Value Date   NA 132 (L) 10/18/2016   K 4.7 10/18/2016   CL 102 10/18/2016   CO2 22 10/18/2016   GLUCOSE 102 (H) 10/18/2016   BUN 18 10/18/2016   CREATININE 1.30 (H) 10/18/2016   CALCIUM 9.1 10/18/2016   PROT 7.3 10/18/2016   ALBUMIN 4.1 10/18/2016   AST 31 10/18/2016   ALT 20 10/18/2016   ALKPHOS 47 10/18/2016   BILITOT 0.7 10/18/2016   GFRNONAA 54 (L) 10/18/2016   GFRAA >60 10/18/2016    Lab Results  Component Value Date   WBC 8.3 10/18/2016   NEUTROABS 6.4 10/18/2016   HGB 14.1 10/18/2016   HCT 40.3 10/18/2016   MCV 118.9 (H) 10/18/2016   PLT 180 10/18/2016   Lab Results  Component Value Date   CEA 2.5 10/18/2016     STUDIES: Ct Chest W Contrast  Result Date: 10/18/2016 CLINICAL DATA:  Rectal cancer restaging with lung metastases. EXAM: CT CHEST, ABDOMEN, AND PELVIS WITH CONTRAST TECHNIQUE: Multidetector CT imaging of the chest, abdomen and pelvis was performed following the standard protocol during bolus administration of intravenous contrast. CONTRAST:  127m ISOVUE-300 IOPAMIDOL (ISOVUE-300) INJECTION 61% COMPARISON:  Chest CT 05/19/2016.  Abdomen and pelvis CT 05/05/2016 FINDINGS: CT CHEST FINDINGS Cardiovascular: The heart size is normal. No pericardial effusion. Coronary artery calcification is noted. Atherosclerotic calcification is noted in the wall of the thoracic aorta. Prominent atherosclerotic plaque is seen at the origin of the left subclavian artery. Right Port-A-Cath tip is positioned at the distal SVC level. Mediastinum/Nodes: No mediastinal lymphadenopathy. There is no hilar lymphadenopathy. The esophagus has normal imaging features. There is no axillary lymphadenopathy. Lungs/Pleura: Stable appearance of bilateral peripheral interstitial disease, right slightly more than left. This is associated with  bronchial wall thickening. Suture line left lower lobe is unchanged. Tiny 3 mm right middle lobe nodule seen previously is unchanged (image 105 series 4). No suspicious pulmonary nodule or mass. No focal airspace consolidation. No pulmonary edema or pleural effusion. Musculoskeletal: Old left rib fractures noted. Bone windows reveal no worrisome lytic or sclerotic osseous lesions. CT ABDOMEN PELVIS FINDINGS Hepatobiliary: No focal abnormality within the liver parenchyma. There is no evidence for gallstones, gallbladder wall thickening, or pericholecystic fluid. No intrahepatic or extrahepatic biliary dilation. Pancreas: No focal mass lesion. No dilatation of the main duct. No intraparenchymal cyst. No peripancreatic edema. Spleen: No splenomegaly. No focal mass lesion. Adrenals/Urinary Tract: Stable appearance tiny bilateral adrenal nodules comparing back to 03/25/2014 consistent with benign etiology. 2.6 cm interpolar right renal cyst noted, as before. 10 mm cyst identified upper pole right kidney. No evidence for enhancing lesion in either kidney. No evidence for hydroureter. The urinary bladder appears normal for the degree of distention. Stomach/Bowel: Stomach is nondistended. No gastric wall thickening. No evidence of outlet obstruction. Duodenum is normally positioned as is the ligament  of Treitz. No small bowel wall thickening. No small bowel dilatation. The terminal ileum is normal. The appendix is normal. Left lower quadrant sigmoid end colostomy noted. Patient is status post abdominal perineal resection of the rectum. Vascular/Lymphatic: There is abdominal aortic atherosclerosis without aneurysm. There is no gastrohepatic or hepatoduodenal ligament lymphadenopathy. No intraperitoneal or retroperitoneal lymphadenopathy. No pelvic sidewall lymphadenopathy. Reproductive: The prostate gland and seminal vesicles have normal imaging features. Other: No intraperitoneal free fluid. Musculoskeletal: Bone windows  reveal no worrisome lytic or sclerotic osseous lesions. IMPRESSION: 1. Stable CT chest. Postsurgical change left lower lobe. No new or progressive findings. 2. Stable CT evaluation of the abdomen and pelvis without evidence for recurrent or metastatic disease. Status post abdominal perineal resection for rectal cancer. 3. Coronary artery and thoracoabdominal aortic atherosclerosis. Electronically Signed   By: Kennith Center M.D.   On: 10/18/2016 13:44   Ct Abdomen Pelvis W Contrast  Result Date: 10/18/2016 CLINICAL DATA:  Rectal cancer restaging with lung metastases. EXAM: CT CHEST, ABDOMEN, AND PELVIS WITH CONTRAST TECHNIQUE: Multidetector CT imaging of the chest, abdomen and pelvis was performed following the standard protocol during bolus administration of intravenous contrast. CONTRAST:  ISOVUE-300 IOPAMIDOL (ISOVUE-300) INJECTION 61% COMPARISON:  Chest CT 05/19/2016.  Abdomen and pelvis CT 05/05/2016 FINDINGS: CT CHEST FINDINGS Cardiovascular: The heart size is normal. No pericardial effusion. Coronary artery calcification is noted. Atherosclerotic calcification is noted in the wall of the thoracic aorta. Prominent atherosclerotic plaque is seen at the origin of the left subclavian artery. Right Port-A-Cath tip is positioned at the distal SVC level. Mediastinum/Nodes: No mediastinal lymphadenopathy. There is no hilar lymphadenopathy. The esophagus has normal imaging features. There is no axillary lymphadenopathy. Lungs/Pleura: Stable appearance of bilateral peripheral interstitial disease, right slightly more than left. This is associated with bronchial wall thickening. Suture line left lower lobe is unchanged. Tiny 3 mm right middle lobe nodule seen previously is unchanged (image 105 series 4). No suspicious pulmonary nodule or mass. No focal airspace consolidation. No pulmonary edema or pleural effusion. Musculoskeletal: Old left rib fractures noted. Bone windows reveal no worrisome lytic or  sclerotic osseous lesions. CT ABDOMEN PELVIS FINDINGS Hepatobiliary: No focal abnormality within the liver parenchyma. There is no evidence for gallstones, gallbladder wall thickening, or pericholecystic fluid. No intrahepatic or extrahepatic biliary dilation. Pancreas: No focal mass lesion. No dilatation of the main duct. No intraparenchymal cyst. No peripancreatic edema. Spleen: No splenomegaly. No focal mass lesion. Adrenals/Urinary Tract: Stable appearance tiny bilateral adrenal nodules comparing back to 03/25/2014 consistent with benign etiology. 2.6 cm interpolar right renal cyst noted, as before. 10 mm cyst identified upper pole right kidney. No evidence for enhancing lesion in either kidney. No evidence for hydroureter. The urinary bladder appears normal for the degree of distention. Stomach/Bowel: Stomach is nondistended. No gastric wall thickening. No evidence of outlet obstruction. Duodenum is normally positioned as is the ligament of Treitz. No small bowel wall thickening. No small bowel dilatation. The terminal ileum is normal. The appendix is normal. Left lower quadrant sigmoid end colostomy noted. Patient is status post abdominal perineal resection of the rectum. Vascular/Lymphatic: There is abdominal aortic atherosclerosis without aneurysm. There is no gastrohepatic or hepatoduodenal ligament lymphadenopathy. No intraperitoneal or retroperitoneal lymphadenopathy. No pelvic sidewall lymphadenopathy. Reproductive: The prostate gland and seminal vesicles have normal imaging features. Other: No intraperitoneal free fluid. Musculoskeletal: Bone windows reveal no worrisome lytic or sclerotic osseous lesions. IMPRESSION: 1. Stable CT chest. Postsurgical change left lower lobe. No new or progressive findings.  2. Stable CT evaluation of the abdomen and pelvis without evidence for recurrent or metastatic disease. Status post abdominal perineal resection for rectal cancer. 3. Coronary artery and  thoracoabdominal aortic atherosclerosis. Electronically Signed   By: Misty Stanley M.D.   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   PLAN:    1. 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 K-ras mutation positive: CT scan results from October 18, 2016 reviewed independently and reported as above with no obvious evidence of recurrence. CEA continues to be within normal limits. Will reimage with CT every 6 months for approximately 2 years after completing his treatments which was on December 09, 2014. Return to clinic in 6 months for repeat imaging and further evaluation. At that point, patient can be switched to yearly imaging.  2. Peripheral neuropathy: Unchanged. Secondary to chemotherapy. Patient was again offered gabapentin, but he wishes not to pursue treatment at this time. 3. Rectal Pain/Pressure: Unchanged. Secondary to surgery. 4. Elevated MCV: Likely residual from chemotherapy several years ago. Patient is not anemic. Monitor.  Patient expressed understanding and was in agreement with this plan. He also understands that He can call clinic at any time with any questions, concerns, or complaints.    Lloyd Huger, MD   10/23/2016 9:42 AM

## 2016-10-20 ENCOUNTER — Inpatient Hospital Stay (HOSPITAL_BASED_OUTPATIENT_CLINIC_OR_DEPARTMENT_OTHER): Payer: Medicare HMO | Admitting: Oncology

## 2016-10-20 ENCOUNTER — Encounter: Payer: Self-pay | Admitting: Oncology

## 2016-10-20 ENCOUNTER — Inpatient Hospital Stay: Payer: Medicare HMO

## 2016-10-20 VITALS — BP 127/86 | HR 94 | Temp 98.6°F | Resp 18 | Wt 197.4 lb

## 2016-10-20 DIAGNOSIS — Z803 Family history of malignant neoplasm of breast: Secondary | ICD-10-CM

## 2016-10-20 DIAGNOSIS — K6289 Other specified diseases of anus and rectum: Secondary | ICD-10-CM

## 2016-10-20 DIAGNOSIS — R7989 Other specified abnormal findings of blood chemistry: Secondary | ICD-10-CM | POA: Diagnosis not present

## 2016-10-20 DIAGNOSIS — Z79899 Other long term (current) drug therapy: Secondary | ICD-10-CM

## 2016-10-20 DIAGNOSIS — Z933 Colostomy status: Secondary | ICD-10-CM

## 2016-10-20 DIAGNOSIS — Z85048 Personal history of other malignant neoplasm of rectum, rectosigmoid junction, and anus: Secondary | ICD-10-CM

## 2016-10-20 DIAGNOSIS — Z9221 Personal history of antineoplastic chemotherapy: Secondary | ICD-10-CM

## 2016-10-20 DIAGNOSIS — I10 Essential (primary) hypertension: Secondary | ICD-10-CM

## 2016-10-20 DIAGNOSIS — C20 Malignant neoplasm of rectum: Secondary | ICD-10-CM

## 2016-10-20 DIAGNOSIS — Z862 Personal history of diseases of the blood and blood-forming organs and certain disorders involving the immune mechanism: Secondary | ICD-10-CM

## 2016-10-20 DIAGNOSIS — G629 Polyneuropathy, unspecified: Secondary | ICD-10-CM | POA: Diagnosis not present

## 2016-10-20 DIAGNOSIS — F17210 Nicotine dependence, cigarettes, uncomplicated: Secondary | ICD-10-CM

## 2016-10-20 DIAGNOSIS — Z800 Family history of malignant neoplasm of digestive organs: Secondary | ICD-10-CM

## 2016-10-20 DIAGNOSIS — I700 Atherosclerosis of aorta: Secondary | ICD-10-CM

## 2016-10-20 DIAGNOSIS — Z9889 Other specified postprocedural states: Secondary | ICD-10-CM

## 2016-10-20 NOTE — Progress Notes (Signed)
Complains of peripheral neuropathy, generalized aches and pains, and discomfort in the rectal area which is unchanged since last visit. Depending on the weather, pains and discomfort may worsen per pt.

## 2017-04-19 ENCOUNTER — Telehealth: Payer: Self-pay | Admitting: *Deleted

## 2017-04-19 ENCOUNTER — Other Ambulatory Visit: Payer: Self-pay | Admitting: Oncology

## 2017-04-19 NOTE — Telephone Encounter (Signed)
I returned call to patient regarding concerns about upcoming CT scans. Patient was upset that scans were ordered and scheduled at his December 2017 follow up appointment, scans were originally scheduled for 04/20/17. Patient called today to question why his scans had been cancelled and moved to 04/25/13. I explained to patient scans had been moved due to insurance authorization for scans. I spoke with scheduling as well as Christina regarding authorization for scans. Patient is scheduled for CT chest/abdomen/pelvis on 04/25/17, follow up with Dr. Grayland Ormond on 04/27/17. Patient verbalized understanding of change in appointments but still expresses concern over communication of changes that were made. Appointments will also be mailed to patient.

## 2017-04-20 ENCOUNTER — Inpatient Hospital Stay: Payer: Medicare HMO

## 2017-04-20 ENCOUNTER — Other Ambulatory Visit: Payer: Medicare HMO

## 2017-04-20 ENCOUNTER — Ambulatory Visit: Payer: Medicare HMO

## 2017-04-24 ENCOUNTER — Ambulatory Visit: Payer: Medicare HMO | Admitting: Oncology

## 2017-04-25 ENCOUNTER — Other Ambulatory Visit: Payer: Self-pay

## 2017-04-25 ENCOUNTER — Other Ambulatory Visit: Payer: Medicare HMO

## 2017-04-25 ENCOUNTER — Ambulatory Visit
Admission: RE | Admit: 2017-04-25 | Discharge: 2017-04-25 | Disposition: A | Discharge status: Home / Self Care | Payer: Medicare HMO | Source: Ambulatory Visit | Attending: Oncology | Admitting: Oncology

## 2017-04-25 ENCOUNTER — Inpatient Hospital Stay: Payer: Medicare HMO | Attending: Oncology

## 2017-04-25 DIAGNOSIS — Z79899 Other long term (current) drug therapy: Secondary | ICD-10-CM | POA: Diagnosis not present

## 2017-04-25 DIAGNOSIS — C20 Malignant neoplasm of rectum: Secondary | ICD-10-CM

## 2017-04-25 DIAGNOSIS — I700 Atherosclerosis of aorta: Secondary | ICD-10-CM | POA: Insufficient documentation

## 2017-04-25 DIAGNOSIS — I10 Essential (primary) hypertension: Secondary | ICD-10-CM | POA: Diagnosis not present

## 2017-04-25 DIAGNOSIS — Z85048 Personal history of other malignant neoplasm of rectum, rectosigmoid junction, and anus: Secondary | ICD-10-CM | POA: Insufficient documentation

## 2017-04-25 DIAGNOSIS — Z933 Colostomy status: Secondary | ICD-10-CM | POA: Insufficient documentation

## 2017-04-25 DIAGNOSIS — G893 Neoplasm related pain (acute) (chronic): Secondary | ICD-10-CM | POA: Diagnosis not present

## 2017-04-25 DIAGNOSIS — J439 Emphysema, unspecified: Secondary | ICD-10-CM | POA: Diagnosis not present

## 2017-04-25 DIAGNOSIS — Z9221 Personal history of antineoplastic chemotherapy: Secondary | ICD-10-CM | POA: Insufficient documentation

## 2017-04-25 DIAGNOSIS — G629 Polyneuropathy, unspecified: Secondary | ICD-10-CM | POA: Diagnosis not present

## 2017-04-25 DIAGNOSIS — N281 Cyst of kidney, acquired: Secondary | ICD-10-CM | POA: Diagnosis not present

## 2017-04-25 DIAGNOSIS — F17210 Nicotine dependence, cigarettes, uncomplicated: Secondary | ICD-10-CM | POA: Diagnosis not present

## 2017-04-25 DIAGNOSIS — Z803 Family history of malignant neoplasm of breast: Secondary | ICD-10-CM | POA: Insufficient documentation

## 2017-04-25 DIAGNOSIS — Z800 Family history of malignant neoplasm of digestive organs: Secondary | ICD-10-CM | POA: Diagnosis not present

## 2017-04-25 LAB — CBC WITH DIFFERENTIAL/PLATELET
BASOS ABS: 0.1 10*3/uL (ref 0–0.1)
BASOS PCT: 1 %
EOS ABS: 0.2 10*3/uL (ref 0–0.7)
Eosinophils Relative: 3 %
HEMATOCRIT: 42.6 % (ref 40.0–52.0)
Hemoglobin: 15.3 g/dL (ref 13.0–18.0)
Lymphocytes Relative: 13 %
Lymphs Abs: 0.9 10*3/uL — ABNORMAL LOW (ref 1.0–3.6)
MCH: 39.5 pg — ABNORMAL HIGH (ref 26.0–34.0)
MCHC: 35.8 g/dL (ref 32.0–36.0)
MCV: 110.2 fL — ABNORMAL HIGH (ref 80.0–100.0)
MONO ABS: 0.8 10*3/uL (ref 0.2–1.0)
Monocytes Relative: 12 %
NEUTROS ABS: 4.9 10*3/uL (ref 1.4–6.5)
NEUTROS PCT: 71 %
Platelets: 191 10*3/uL (ref 150–440)
RBC: 3.86 MIL/uL — ABNORMAL LOW (ref 4.40–5.90)
RDW: 16 % — AB (ref 11.5–14.5)
WBC: 6.9 10*3/uL (ref 3.8–10.6)

## 2017-04-25 LAB — COMPREHENSIVE METABOLIC PANEL
ALBUMIN: 4.3 g/dL (ref 3.5–5.0)
ALT: 28 U/L (ref 17–63)
ANION GAP: 11 (ref 5–15)
AST: 40 U/L (ref 15–41)
Alkaline Phosphatase: 59 U/L (ref 38–126)
BILIRUBIN TOTAL: 1.1 mg/dL (ref 0.3–1.2)
BUN: 15 mg/dL (ref 6–20)
CO2: 25 mmol/L (ref 22–32)
Calcium: 9.7 mg/dL (ref 8.9–10.3)
Chloride: 96 mmol/L — ABNORMAL LOW (ref 101–111)
Creatinine, Ser: 1.41 mg/dL — ABNORMAL HIGH (ref 0.61–1.24)
GFR calc Af Amer: 57 mL/min — ABNORMAL LOW (ref >60)
GFR calc non Af Amer: 49 mL/min — ABNORMAL LOW (ref >60)
GLUCOSE: 110 mg/dL — AB (ref 65–99)
POTASSIUM: 4.8 mmol/L (ref 3.5–5.1)
SODIUM: 132 mmol/L — AB (ref 135–145)
TOTAL PROTEIN: 7.7 g/dL (ref 6.5–8.1)

## 2017-04-25 MED ORDER — IOPAMIDOL (ISOVUE-300) INJECTION 61%
85.0000 mL | Freq: Once | INTRAVENOUS | Status: AC | PRN
  Administered 2017-04-25: 85 mL via INTRAVENOUS

## 2017-04-26 LAB — CEA: CEA: 2.7 ng/mL (ref 0.0–4.7)

## 2017-04-26 NOTE — Progress Notes (Signed)
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 K-ras mutation positive.  INTERVAL HISTORY: Patient returns to clinic today for routine follow-up, laboratory work, and discussion of his imaging results. He feels that his peripheral neuropathy has been getting worse and now affecting his daily life. He reports difficulty with balance and walking. He also still complains of rectal pain/pressure which is unchanged.  He does not complain of any further bleeding. He has no other neurologic complaints. He denies any chest pain or shortness of breath. He has a good appetite and denies weight loss. He denies any recent fevers. He denies any nausea, vomiting, constipation, or diarrhea. He has no melena or hematochezia. 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 Patient offers no further specific complaints today.  REVIEW OF SYSTEMS:   Review of Systems  Constitutional: Negative.  Negative for fever and malaise/fatigue.  Respiratory: Negative.  Negative for cough, hemoptysis and shortness of breath.   Cardiovascular: Negative.  Negative for chest pain.  Gastrointestinal: Negative for abdominal pain, blood in stool, melena, nausea and vomiting.       Pelvic/rectal pain.  Genitourinary: Negative.   Musculoskeletal: Negative.   Neurological: Positive for sensory change. Negative for weakness.  Psychiatric/Behavioral: Negative.     As per HPI. Otherwise, a complete review of systems is negative.  PAST MEDICAL HISTORY: Past Medical History:  Diagnosis Date  . Allergic rhinitis   . History of anemia   . History of chicken pox   . HTN (hypertension)   . Rectal cancer metastasized to lung [facility]) 09-2013   surgery and chemo  . Smoker     PAST SURGICAL  HISTORY: Past Surgical History:  Procedure Laterality Date  . EUS N/A 11/15/2013   Procedure: LOWER ENDOSCOPIC ULTRASOUND (EUS);  Surgeon: Rachael Fee, MD;  Location: [facility];  Service: Endoscopy;  Laterality: N/A;  . TONSILLECTOMY AND ADENOIDECTOMY  Childhood  . VARICOSE VEIN SURGERY  2010    FAMILY HISTORY Family History  Problem Relation Age of Onset  . Hypertension Mother   . Coronary artery disease Mother        angina  . Cancer Mother        breast  . Cancer Father 68       esophageal  . COPD Brother        emphysema  . Cancer Paternal Grandmother        colon  . Diabetes Neg Hx        ADVANCED DIRECTIVES:    HEALTH MAINTENANCE: Social History  Substance Use Topics  . Smoking status: Current Every Day Smoker    Packs/day: 0.80    Types: Cigarettes  . Smokeless tobacco: Never Used  . Alcohol use Yes     Comment: Regular 2 scotch/day     Allergies  Allergen Reactions  . No Known Allergies     Current Outpatient Prescriptions  Medication Sig Dispense Refill  . esomeprazole (NEXIUM) 20 MG packet Take 20 mg by mouth daily before breakfast.    . gabapentin (NEURONTIN) 300 MG capsule Take 1 capsule (300 mg total) by mouth 3 (three) times daily. 90 capsule 1  . ibuprofen (ADVIL,MOTRIN) 200 MG tablet Take 200 mg by mouth every 6 (six) hours as needed.    . loratadine (CLARITIN) 10  MG tablet Take 10 mg by mouth daily as needed.    Marland Kitchen olmesartan (BENICAR) 20 MG tablet Take 20 mg by mouth daily.    . polyethylene glycol (MIRALAX / GLYCOLAX) packet Take by mouth daily as needed.      No current facility-administered medications for this visit.     OBJECTIVE: Vitals:   04/27/17 1505  BP: 115/77  Pulse: 83  Resp: 20  Temp: 98.1 F (36.7 C)     Body mass index is 31.61 kg/m.    ECOG FS:0 - Asymptomatic  General: Well-developed, well-nourished, no acute distress. Eyes: anicteric sclera. Lungs: Clear to auscultation bilaterally. Heart: Regular rate  and rhythm. No rubs, murmurs, or gallops. Abdomen: Soft, nontender, nondistended. Normoactive bowel sounds. Colostomy noted. Musculoskeletal: No edema, cyanosis, or clubbing. Neuro: Alert, answering all questions appropriately. Cranial nerves grossly intact. Skin: No rashes or petechiae noted. Psych: Normal affect.   LAB RESULTS:  Lab Results  Component Value Date   NA 132 (L) 04/25/2017   K 4.8 04/25/2017   CL 96 (L) 04/25/2017   CO2 25 04/25/2017   GLUCOSE 110 (H) 04/25/2017   BUN 15 04/25/2017   CREATININE 1.41 (H) 04/25/2017   CALCIUM 9.7 04/25/2017   PROT 7.7 04/25/2017   ALBUMIN 4.3 04/25/2017   AST 40 04/25/2017   ALT 28 04/25/2017   ALKPHOS 59 04/25/2017   BILITOT 1.1 04/25/2017   GFRNONAA 49 (L) 04/25/2017   GFRAA 57 (L) 04/25/2017    Lab Results  Component Value Date   WBC 6.9 04/25/2017   NEUTROABS 4.9 04/25/2017   HGB 15.3 04/25/2017   HCT 42.6 04/25/2017   MCV 110.2 (H) 04/25/2017   PLT 191 04/25/2017   Lab Results  Component Value Date   CEA 2.7 04/25/2017     STUDIES: Ct Chest W Contrast  Result Date: 04/25/2017 CLINICAL DATA:  Restaging rectal cancer with lung metastases. EXAM: CT CHEST, ABDOMEN, AND PELVIS WITH CONTRAST TECHNIQUE: Multidetector CT imaging of the chest, abdomen and pelvis was performed following the standard protocol during bolus administration of intravenous contrast. CONTRAST:  78m ISOVUE-300 IOPAMIDOL (ISOVUE-300) INJECTION 61% COMPARISON:  10/18/2016 FINDINGS: CT CHEST FINDINGS Cardiovascular: The heart size is normal. No pericardial effusion. Coronary artery calcification is noted. Atherosclerotic calcification is noted in the wall of the thoracic aorta. Prominent atherosclerosis proximal left subclavian artery with potential flow limiting stenosis. Mediastinum/Nodes: No mediastinal lymphadenopathy. There is no hilar lymphadenopathy. The esophagus has normal imaging features. There is no axillary lymphadenopathy. Lungs/Pleura:  Stable appearance interstitial and subtle areas of subpleural ground-glass attenuation. Left lower lobe suture line is stable. No suspicious pulmonary nodule or mass. No focal airspace consolidation. No pulmonary edema or pleural effusion. Centrilobular and paraseptal emphysema noted. Musculoskeletal: Bone windows reveal no worrisome lytic or sclerotic osseous lesions. CT ABDOMEN PELVIS FINDINGS Hepatobiliary: The liver shows diffusely decreased attenuation suggesting steatosis. There is no evidence for gallstones, gallbladder wall thickening, or pericholecystic fluid. No intrahepatic or extrahepatic biliary dilation. Pancreas: No focal mass lesion. No dilatation of the main duct. No intraparenchymal cyst. No peripancreatic edema. Spleen: No splenomegaly. No focal mass lesion. Adrenals/Urinary Tract: No adrenal nodule or mass. 2.5 cm exophytic cystic lesion interpolar right kidney is stable. 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 No evidence for hydroureter. The urinary bladder appears normal for the degree of distention. Stomach/Bowel: Stomach is nondistended. No gastric wall thickening. No evidence of outlet obstruction. Duodenum is normally positioned as is the  ligament of Treitz. No small bowel wall thickening. No small bowel dilatation. The terminal ileum is normal. The appendix is normal. Left lower quadrant sigmoid end colostomy noted. Vascular/Lymphatic: There is abdominal aortic atherosclerosis without aneurysm. There is no gastrohepatic or hepatoduodenal ligament lymphadenopathy. No intraperitoneal or retroperitoneal lymphadenopathy. No pelvic sidewall lymphadenopathy. Reproductive: The prostate gland and seminal vesicles have normal imaging features. Other: No intraperitoneal free fluid. Musculoskeletal: Stable appearance of presacral soft tissue attenuation. Bone windows reveal no worrisome lytic or sclerotic osseous lesions. IMPRESSION:  1. Stable exam. No evidence for metastatic disease in the chest, abdomen, or pelvis. 2. Stable appearance of postop changes left lower lobe. No new or progressive findings. 3. Stable right renal cyst. Abdominal Aortic Atherosclerois (ICD10-170.0) Emphysema. ([id number]) Electronically Signed   By: Misty Stanley M.D.   On: 04/25/2017 13:20   Ct Abdomen Pelvis W Contrast  Result Date: 04/25/2017 CLINICAL DATA:  Restaging rectal cancer with lung metastases. EXAM: CT CHEST, ABDOMEN, AND PELVIS WITH CONTRAST TECHNIQUE: Multidetector CT imaging of the chest, abdomen and pelvis was performed following the standard protocol during bolus administration of intravenous contrast. CONTRAST:  39m ISOVUE-300 IOPAMIDOL (ISOVUE-300) INJECTION 61% COMPARISON:  10/18/2016 FINDINGS: CT CHEST FINDINGS Cardiovascular: The heart size is normal. No pericardial effusion. Coronary artery calcification is noted. Atherosclerotic calcification is noted in the wall of the thoracic aorta. Prominent atherosclerosis proximal left subclavian artery with potential flow limiting stenosis. Mediastinum/Nodes: No mediastinal lymphadenopathy. There is no hilar lymphadenopathy. The esophagus has normal imaging features. There is no axillary lymphadenopathy. Lungs/Pleura: Stable appearance interstitial and subtle areas of subpleural ground-glass attenuation. Left lower lobe suture line is stable. No suspicious pulmonary nodule or mass. No focal airspace consolidation. No pulmonary edema or pleural effusion. Centrilobular and paraseptal emphysema noted. Musculoskeletal: Bone windows reveal no worrisome lytic or sclerotic osseous lesions. CT ABDOMEN PELVIS FINDINGS Hepatobiliary: The liver shows diffusely decreased attenuation suggesting steatosis. There is no evidence for gallstones, gallbladder wall thickening, or pericholecystic fluid. No intrahepatic or extrahepatic biliary dilation. Pancreas: No focal mass lesion. No dilatation of the main duct. No  intraparenchymal cyst. No peripancreatic edema. Spleen: No splenomegaly. No focal mass lesion. Adrenals/Urinary Tract: No adrenal nodule or mass. 2.5 cm exophytic cystic lesion interpolar right kidney is stable. 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 No evidence for hydroureter. The urinary bladder appears normal for the degree of distention. Stomach/Bowel: Stomach is nondistended. No gastric wall thickening. No evidence of outlet obstruction. Duodenum is normally positioned as is the ligament of Treitz. No small bowel wall thickening. No small bowel dilatation. The terminal ileum is normal. The appendix is normal. Left lower quadrant sigmoid end colostomy noted. Vascular/Lymphatic: There is abdominal aortic atherosclerosis without aneurysm. There is no gastrohepatic or hepatoduodenal ligament lymphadenopathy. No intraperitoneal or retroperitoneal lymphadenopathy. No pelvic sidewall lymphadenopathy. Reproductive: The prostate gland and seminal vesicles have normal imaging features. Other: No intraperitoneal free fluid. Musculoskeletal: Stable appearance of presacral soft tissue attenuation. Bone windows reveal no worrisome lytic or sclerotic osseous lesions. IMPRESSION: 1. Stable exam. No evidence for metastatic disease in the chest, abdomen, or pelvis. 2. Stable appearance of postop changes left lower lobe. No new or progressive findings. 3. Stable right renal cyst. Abdominal Aortic Atherosclerois (ICD10-170.0) Emphysema. (([id number]) Electronically Signed   By: EMisty StanleyM.D.   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   PLAN:    1.  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 K-ras mutation positive: CT scan results from April 19, 2017 reviewed independently and reported as above with no obvious evidence of recurrence. CEA continues to be  within normal limits. Patient had completed his adjuvant chemotherapy on December 09, 2014 and now can be switched to yearly imaging until 2021. Return to clinic in 6 months for repeat laboratory work and further evaluation.   2. Peripheral neuropathy: Patient reports this is getting worse. He is now agreed to try gabapentin was given a prescription for 300 mg 3 times per day. Will consider neurology referral in the future as well. Patient has been instructed to call clinic in 1 month to report any interval change of his symptoms. 3. Rectal Pain/Pressure: Unchanged. Secondary to surgery. 4. Elevated MCV: Likely residual from chemotherapy several years ago. Patient is not anemic. Monitor.  Approximately 30 minutes was spent in discussion of which greater than 50% was consultation.  Patient expressed understanding and was in agreement with this plan. He also understands that He can call clinic at any time with any questions, concerns, or complaints.    Lloyd Huger, MD   05/01/2017 2:59 PM

## 2017-04-27 ENCOUNTER — Inpatient Hospital Stay (HOSPITAL_BASED_OUTPATIENT_CLINIC_OR_DEPARTMENT_OTHER): Payer: Medicare HMO | Admitting: Oncology

## 2017-04-27 ENCOUNTER — Ambulatory Visit: Payer: Medicare HMO | Admitting: Oncology

## 2017-04-27 VITALS — BP 115/77 | HR 83 | Temp 98.1°F | Resp 20 | Wt 201.8 lb

## 2017-04-27 DIAGNOSIS — Z800 Family history of malignant neoplasm of digestive organs: Secondary | ICD-10-CM

## 2017-04-27 DIAGNOSIS — I700 Atherosclerosis of aorta: Secondary | ICD-10-CM | POA: Diagnosis not present

## 2017-04-27 DIAGNOSIS — I10 Essential (primary) hypertension: Secondary | ICD-10-CM

## 2017-04-27 DIAGNOSIS — Z85048 Personal history of other malignant neoplasm of rectum, rectosigmoid junction, and anus: Secondary | ICD-10-CM

## 2017-04-27 DIAGNOSIS — Z803 Family history of malignant neoplasm of breast: Secondary | ICD-10-CM

## 2017-04-27 DIAGNOSIS — F17210 Nicotine dependence, cigarettes, uncomplicated: Secondary | ICD-10-CM

## 2017-04-27 DIAGNOSIS — Z9221 Personal history of antineoplastic chemotherapy: Secondary | ICD-10-CM | POA: Diagnosis not present

## 2017-04-27 DIAGNOSIS — Z79899 Other long term (current) drug therapy: Secondary | ICD-10-CM | POA: Diagnosis not present

## 2017-04-27 DIAGNOSIS — G629 Polyneuropathy, unspecified: Secondary | ICD-10-CM | POA: Diagnosis not present

## 2017-04-27 DIAGNOSIS — J439 Emphysema, unspecified: Secondary | ICD-10-CM

## 2017-04-27 DIAGNOSIS — N281 Cyst of kidney, acquired: Secondary | ICD-10-CM | POA: Diagnosis not present

## 2017-04-27 DIAGNOSIS — Z933 Colostomy status: Secondary | ICD-10-CM

## 2017-04-27 DIAGNOSIS — C20 Malignant neoplasm of rectum: Secondary | ICD-10-CM

## 2017-04-27 DIAGNOSIS — G893 Neoplasm related pain (acute) (chronic): Secondary | ICD-10-CM

## 2017-04-27 MED ORDER — GABAPENTIN 300 MG PO CAPS: 300.0000 mg | ORAL_CAPSULE | Freq: Three times a day (TID) | ORAL | 1 refills | Status: DC

## 2017-04-27 NOTE — Progress Notes (Signed)
Patient reports worsening neuropathy, neuropathy is affecting patients ADLs. Patient reports difficulty with balance, reports he has tried taking vitamins with no improvement.

## 2017-07-21 ENCOUNTER — Encounter: Payer: Self-pay | Admitting: *Deleted

## 2017-07-24 ENCOUNTER — Ambulatory Visit (INDEPENDENT_AMBULATORY_CARE_PROVIDER_SITE_OTHER): Payer: Medicare HMO | Admitting: General Surgery

## 2017-07-24 ENCOUNTER — Encounter: Payer: Self-pay | Admitting: General Surgery

## 2017-07-24 VITALS — BP 130/80 | HR 91 | Resp 14 | Ht 67.0 in | Wt 203.0 lb

## 2017-07-24 DIAGNOSIS — Z85038 Personal history of other malignant neoplasm of large intestine: Secondary | ICD-10-CM

## 2017-07-24 DIAGNOSIS — Z433 Encounter for attention to colostomy: Secondary | ICD-10-CM | POA: Diagnosis not present

## 2017-07-24 NOTE — Patient Instructions (Signed)
Return as needed

## 2017-07-24 NOTE — Progress Notes (Signed)
Patient ID: Stephen [name], male   DOB: 23-Apr-1946, 71 y.o.   MRN: [id number]  Chief Complaint  Patient presents with  . Other    HPI Stephen [name] is a 71 y.o. male here today for a evaluation of a hernia near is stoma site. No pain .Wife, Sydell Axon is present at visit. Patient had AP resection in  2015 by Dr. Rochel Brome.   Following up with Dr. Cecille Aver at [facility] and has been declared cancer free. Also had isolated lung met that was resected. Subsequent to his chest surgery he had laparotomy and lysis of adhesions for small bowel obstruction.  HPI  Past Medical History:  Diagnosis Date  . Allergic rhinitis   . History of anemia   . History of chicken pox   . HTN (hypertension)   . Rectal cancer metastasized to lung [facility]) 09-2013   surgery and chemo  . Smoker     Past Surgical History:  Procedure Laterality Date  . COLON RESECTION  2015  . COLONOSCOPY  2015  . EUS N/A 11/15/2013   Procedure: LOWER ENDOSCOPIC ULTRASOUND (EUS);  Surgeon: Milus Banister, MD;  Location: [facility];  Service: Endoscopy;  Laterality: N/A;  . lung removed  2015   left lower   . TONSILLECTOMY AND ADENOIDECTOMY  Childhood  . VARICOSE VEIN SURGERY  2010    Family History  Problem Relation Age of Onset  . Hypertension Mother   . Coronary artery disease Mother        angina  . Cancer Mother        breast  . Cancer Father 56       esophageal  . COPD Brother        emphysema  . Cancer Paternal Grandmother        colon  . Diabetes Neg Hx     Social History Social History  Substance Use Topics  . Smoking status: Current Every Day Smoker    Packs/day: 0.80    Types: Cigarettes  . Smokeless tobacco: Never Used  . Alcohol use Yes     Comment: Regular 2 scotch/day    Allergies  Allergen Reactions  . No Known Allergies     Current Outpatient Prescriptions  Medication Sig Dispense Refill  . cholecalciferol (VITAMIN D) 400 units TABS tablet Take 400 Units by mouth.    .  cyanocobalamin 500 MCG tablet Take 500 mcg by mouth daily.    Marland Kitchen esomeprazole (NEXIUM) 20 MG packet Take 20 mg by mouth daily before breakfast.    . ibuprofen (ADVIL,MOTRIN) 200 MG tablet Take 200 mg by mouth every 6 (six) hours as needed.    . loratadine (CLARITIN) 10 MG tablet Take 10 mg by mouth daily as needed.    Marland Kitchen olmesartan (BENICAR) 20 MG tablet Take 20 mg by mouth daily.    . polyethylene glycol (MIRALAX / GLYCOLAX) packet Take by mouth daily as needed.      No current facility-administered medications for this visit.     Review of Systems Review of Systems  Blood pressure 130/80, pulse 91, resp. rate 14, height '5\' 7"'$  (1.702 m), weight 203 lb (92.1 kg).  Physical Exam Physical Exam  Constitutional: He is oriented to person, place, and time. He appears well-developed and well-nourished.  Eyes: Conjunctivae are normal. No scleral icterus.  Neck: Neck supple.  Cardiovascular: Normal rate, regular rhythm and normal heart sounds.   Pulmonary/Chest: Effort normal and breath sounds normal.  Abdominal: Soft. Normal appearance and  bowel sounds are normal. There is no hepatomegaly. There is no tenderness. No hernia.    Lymphadenopathy:    He has no cervical adenopathy.  Neurological: He is alert and oriented to person, place, and time.  Skin: Skin is warm and dry.    Data Reviewed Notes reviewed  Patient had a CT scan of the abdomen performed in year.This was reviewed and showed no evidence of a hernia on the colostomy site, Assessment    Colostomy area with slight bulge-no hernia noted  History of rectal cancer.      Plan    Patient advised-no hernia noted. Advised to follow up if there is any further enlargement of the colostomy side and or any associated symptoms with it  HPI, Physical Exam, Assessment and Plan have been scribed under the direction and in the presence of Mckinley Jewel, MD  Gaspar Cola, CMA ,asaa        Junie Panning G 07/25/2017, 9:15  AM

## 2017-08-15 ENCOUNTER — Ambulatory Visit (INDEPENDENT_AMBULATORY_CARE_PROVIDER_SITE_OTHER): Payer: Medicare HMO | Admitting: Urology

## 2017-08-15 ENCOUNTER — Encounter: Payer: Self-pay | Admitting: Urology

## 2017-08-15 DIAGNOSIS — N5201 Erectile dysfunction due to arterial insufficiency: Secondary | ICD-10-CM | POA: Diagnosis not present

## 2017-08-15 MED ORDER — SILDENAFIL CITRATE 20 MG PO TABS: ORAL_TABLET | ORAL | 5 refills | Status: DC

## 2017-08-15 NOTE — Progress Notes (Signed)
08/15/2017 10:13 AM   Stephen [name] [birth year] [id number]  Referring provider: [name], MD [address] [place], [place] [address]  Chief Complaint  Patient presents with  . Erectile Dysfunction    New Patient    HPI: New patient for ED. He cannot get an erection. This started suddenly after his APR and rectal ca treatment. No Am erections. His SHIM score is 5. He can get a soft one with stimulation. He gets some penile pain and his "testicles draw up". He has a good libido. He has one son. He tried Cialis a few years ago with partial results. NG risk includes APR and peripheral neuropathy. He has no bothersome LUTS. He has nocturia x 2-3.   Modifying factors: There are no other modifying factors  Associated signs and symptoms: There are no other associated signs and symptoms Aggravating and relieving factors: There are no other aggravating or relieving factors Severity: Moderate Duration: Persistent  No recent PSA. He has no rectum for DRE. An uncle had PCa.    PMH: Past Medical History:  Diagnosis Date  . Allergic rhinitis   . History of anemia   . History of chicken pox   . HTN (hypertension)   . Rectal cancer metastasized to lung [facility]) 09-2013   surgery and chemo  . Smoker     Surgical History: Past Surgical History:  Procedure Laterality Date  . COLON RESECTION  2015  . COLONOSCOPY  2015  . EUS N/A 11/15/2013   Procedure: LOWER ENDOSCOPIC ULTRASOUND (EUS);  Surgeon: Milus Banister, MD;  Location: [facility];  Service: Endoscopy;  Laterality: N/A;  . LUNG BIOPSY    . LUNG LOBECTOMY Left   . lung removed  2015   left lower   . TONSILLECTOMY AND ADENOIDECTOMY  Childhood  . VARICOSE VEIN SURGERY  2010    Home Medications:  Allergies as of 08/15/2017      Reactions   No Known Allergies       Medication List       Accurate as of 08/15/17 10:13 AM. Always use your most recent med list.          cholecalciferol 400 units Tabs tablet Commonly  known as:  VITAMIN D Take 400 Units by mouth.   cyanocobalamin 500 MCG tablet Take 500 mcg by mouth daily.   esomeprazole 20 MG packet Commonly known as:  NEXIUM Take 20 mg by mouth daily before breakfast.   ibuprofen 200 MG tablet Commonly known as:  ADVIL,MOTRIN Take 200 mg by mouth every 6 (six) hours as needed.   loratadine 10 MG tablet Commonly known as:  CLARITIN Take 10 mg by mouth daily as needed.   olmesartan 20 MG tablet Commonly known as:  BENICAR Take 20 mg by mouth daily.   polyethylene glycol packet Commonly known as:  MIRALAX / GLYCOLAX Take by mouth daily as needed.       Allergies:  Allergies  Allergen Reactions  . No Known Allergies     Family History: Family History  Problem Relation Age of Onset  . Hypertension Mother   . Coronary artery disease Mother        angina  . Cancer Mother        breast  . Cancer Father 25       esophageal  . COPD Brother        emphysema  . Cancer Paternal Grandmother        colon  . Diabetes Neg  Hx     Social History:  reports that he has been smoking Cigarettes.  He has been smoking about 0.80 packs per day. He has never used smokeless tobacco. He reports that he drinks alcohol. He reports that he does not use drugs.  ROS: UROLOGY Frequent Urination?: No Hard to postpone urination?: No Burning/pain with urination?: Yes Get up at night to urinate?: Yes Leakage of urine?: No Urine stream starts and stops?: No Trouble starting stream?: No Do you have to strain to urinate?: No Blood in urine?: No Urinary tract infection?: No Sexually transmitted disease?: No Injury to kidneys or bladder?: No Painful intercourse?: No Weak stream?: No Erection problems?: Yes Penile pain?: Yes  Gastrointestinal Nausea?: No Vomiting?: No Indigestion/heartburn?: Yes Diarrhea?: No Constipation?: Yes  Constitutional Fever: No Night sweats?: No Weight loss?: No Fatigue?: No  Skin Skin rash/lesions?:  No Itching?: No  Eyes Blurred vision?: No Double vision?: No  Ears/Nose/Throat Sore throat?: No Sinus problems?: Yes  Hematologic/Lymphatic Swollen glands?: No Easy bruising?: Yes  Cardiovascular Leg swelling?: Yes Chest pain?: No  Respiratory Cough?: Yes Shortness of breath?: Yes  Endocrine Excessive thirst?: No  Musculoskeletal Back pain?: Yes Joint pain?: Yes  Neurological Headaches?: No Dizziness?: Yes  Psychologic Depression?: No Anxiety?: No  Physical Exam: BP (!) 158/99   Pulse 97   Ht 5\' 7"  (1.702 m)   Wt 91.6 kg (202 lb)   BMI 31.64 kg/m   Constitutional:  Alert and oriented, No acute distress. HEENT: [place] AT, moist mucus membranes.  Trachea midline, no masses. Cardiovascular: No clubbing, cyanosis, or edema. Respiratory: Normal respiratory effort, no increased work of breathing. GI: Abdomen is soft, nontender, nondistended, no abdominal masses, no inguinal hernia GU: No CVA tenderness.  Penis: circumcised and normal  Scrotum: testicle descended bilaterally and palpably normal.  Skin: No rashes, bruises or suspicious lesions. Lymph: No cervical or inguinal adenopathy. Neurologic: Grossly intact, no focal deficits, moving all 4 extremities. Psychiatric: Normal mood and affect.  Laboratory Data: Lab Results  Component Value Date   WBC 6.9 04/25/2017   HGB 15.3 04/25/2017   HCT 42.6 04/25/2017   MCV 110.2 (H) 04/25/2017   PLT 191 04/25/2017    Lab Results  Component Value Date   CREATININE 1.41 (H) 04/25/2017    No results found for: PSA1  No results found for: TESTOSTERONE  No results found for: HGBA1C  Urinalysis Lab Results  Component Value Date   APPEARANCEUR Cloudy 04/28/2014   LEUKOCYTESUR Negative 04/28/2014   PROTEINUR 30 mg/dL 04/28/2014   GLUCOSEU Negative 04/28/2014   RBCU 15 /HPF 04/28/2014   BILIRUBINUR Negative 04/28/2014   NITRITE Negative 04/28/2014    Lab Results  Component Value Date   BACTERIA NONE  SEEN 04/28/2014    No results found for this or any previous visit. No results found for this or any previous visit. No results found for this or any previous visit. No results found for this or any previous visit. No results found for this or any previous visit. No results found for this or any previous visit. No results found for this or any previous visit. No results found for this or any previous visit.  Assessment & Plan:   ED - discussed nature r/b of PDE5i, injections, VED and Muse. All questions answered. Discussed off label use with generic sildenafil. I sent 20 mg Rx and he'll return for a test dose injection. We discussed the nature r/b of PSA screening. PSA was sent. Also discussed [facility].   There are no diagnoses linked to this encounter.  No Follow-up on file.  Festus Aloe, [facility] [address] [place], [place] [address] [phone]

## 2017-08-16 LAB — PSA: Prostate Specific Ag, Serum: 0.5 ng/mL (ref 0.0–4.0)

## 2017-08-24 ENCOUNTER — Telehealth: Payer: Self-pay | Admitting: Urology

## 2017-08-24 NOTE — Telephone Encounter (Signed)
Pt called to cancel appt for 10/16 and didn't want to reschedule.  Just F.Y.I.

## 2017-08-29 ENCOUNTER — Ambulatory Visit: Payer: Medicare HMO

## 2017-09-05 ENCOUNTER — Other Ambulatory Visit: Payer: Self-pay | Admitting: Podiatry

## 2017-09-07 ENCOUNTER — Encounter: Payer: Self-pay | Admitting: *Deleted

## 2017-09-08 ENCOUNTER — Encounter: Payer: Self-pay | Admitting: Anesthesiology

## 2017-09-14 NOTE — Discharge Instructions (Signed)
[facility] °[facility] ° °POST OPERATIVE INSTRUCTIONS FOR DR. TROXLER AND DR. FOWLER °[facility] ° ° °1. Take your medication as prescribed.  Pain medication should be taken only as needed. ° °2. Keep the dressing clean, dry and intact. ° °3. Keep your foot elevated above the heart level for the first 48 hours. ° °4. Walking to the bathroom and brief periods of walking are acceptable, unless we have instructed you to be non-weight bearing. ° °5. Always wear your post-op shoe when walking.  Always use your crutches if you are to be non-weight bearing. ° °6. Do not take a shower. Baths are permissible as long as the foot is kept out of the water.  ° °7. Every hour you are awake:  °- Bend your knee 15 times. °- Flex foot 15 times °- Massage calf 15 times ° °8. Call [facility] ([phone]) if any of the following problems occur: °- You develop a temperature or fever. °- The bandage becomes saturated with blood. °- Medication does not stop your pain. °- Injury of the foot occurs. °- Any symptoms of infection including redness, odor, or red streaks running from wound. ° ° °General Anesthesia, Adult, Care After °These instructions provide you with information about caring for yourself after your procedure. Your health care provider may also give you more specific instructions. Your treatment has been planned according to current medical practices, but problems sometimes occur. Call your health care provider if you have any problems or questions after your procedure. °What can I expect after the procedure? °After the procedure, it is common to have: °· Vomiting. °· A sore throat. °· Mental slowness. ° °It is common to feel: °· Nauseous. °· Cold or shivery. °· Sleepy. °· Tired. °· Sore or achy, even in parts of your body where you did not have surgery. ° °Follow these instructions at home: °For at least 24 hours after the procedure: °· Do not: °? Participate in  activities where you could fall or become injured. °? Drive. °? Use heavy machinery. °? Drink alcohol. °? Take sleeping pills or medicines that cause drowsiness. °? Make important decisions or sign legal documents. °? Take care of children on your own. °· Rest. °Eating and drinking °· If you vomit, drink water, juice, or soup when you can drink without vomiting. °· Drink enough fluid to keep your urine clear or pale yellow. °· Make sure you have little or no nausea before eating solid foods. °· Follow the diet recommended by your health care provider. °General instructions °· Have a responsible adult stay with you until you are awake and alert. °· Return to your normal activities as told by your health care provider. Ask your health care provider what activities are safe for you. °· Take over-the-counter and prescription medicines only as told by your health care provider. °· If you smoke, do not smoke without supervision. °· Keep all follow-up visits as told by your health care provider. This is important. °Contact a health care provider if: °· You continue to have nausea or vomiting at home, and medicines are not helpful. °· You cannot drink fluids or start eating again. °· You cannot urinate after 8-12 hours. °· You develop a skin rash. °· You have fever. °· You have increasing redness at the site of your procedure. °Get help right away if: °· You have difficulty breathing. °· You have chest pain. °· You have unexpected bleeding. °· You feel that you   are having a life-threatening or urgent problem. °This information is not intended to replace advice given to you by your health care provider. Make sure you discuss any questions you have with your health care provider. °Document Released: 02/06/2001 Document Revised: 04/04/2016 Document Reviewed: 10/15/2015 °Elsevier Interactive Patient Education © 2018 [employer] ° °

## 2017-09-20 ENCOUNTER — Ambulatory Visit
Admission: RE | Admit: 2017-09-20 | Discharge: 2017-09-20 | Disposition: A | Discharge status: Home / Self Care | Payer: Medicare HMO | Source: Ambulatory Visit | Attending: Podiatry | Admitting: Podiatry

## 2017-09-20 ENCOUNTER — Ambulatory Visit: Payer: Medicare HMO | Admitting: Anesthesiology

## 2017-09-20 ENCOUNTER — Encounter
Admission: RE | Disposition: A | Discharge status: Home / Self Care | Payer: Self-pay | Source: Ambulatory Visit | Attending: Podiatry

## 2017-09-20 DIAGNOSIS — F17200 Nicotine dependence, unspecified, uncomplicated: Secondary | ICD-10-CM | POA: Insufficient documentation

## 2017-09-20 DIAGNOSIS — Z85048 Personal history of other malignant neoplasm of rectum, rectosigmoid junction, and anus: Secondary | ICD-10-CM | POA: Diagnosis not present

## 2017-09-20 DIAGNOSIS — M722 Plantar fascial fibromatosis: Secondary | ICD-10-CM | POA: Insufficient documentation

## 2017-09-20 DIAGNOSIS — Z9889 Other specified postprocedural states: Secondary | ICD-10-CM | POA: Diagnosis not present

## 2017-09-20 DIAGNOSIS — Z85118 Personal history of other malignant neoplasm of bronchus and lung: Secondary | ICD-10-CM | POA: Insufficient documentation

## 2017-09-20 DIAGNOSIS — Z9221 Personal history of antineoplastic chemotherapy: Secondary | ICD-10-CM | POA: Insufficient documentation

## 2017-09-20 DIAGNOSIS — I10 Essential (primary) hypertension: Secondary | ICD-10-CM | POA: Insufficient documentation

## 2017-09-20 DIAGNOSIS — Z79899 Other long term (current) drug therapy: Secondary | ICD-10-CM | POA: Insufficient documentation

## 2017-09-20 HISTORY — PX: PLANTAR FASCIA RELEASE: SHX2239

## 2017-09-20 HISTORY — DX: Polyneuropathy, unspecified: G62.9

## 2017-09-20 HISTORY — DX: Acute embolism and thrombosis of unspecified vein: I82.90

## 2017-09-20 SURGERY — RELEASE, FASCIA, PLANTAR
Anesthesia: General | Laterality: Right | Wound class: Clean

## 2017-09-20 MED ORDER — ONDANSETRON HCL 4 MG/2ML IJ SOLN
INTRAMUSCULAR | Status: DC | PRN
  Administered 2017-09-20: 4 mg via INTRAVENOUS

## 2017-09-20 MED ORDER — PROPOFOL 10 MG/ML IV BOLUS
INTRAVENOUS | Status: DC | PRN
Start: 2017-09-20 — End: 2017-09-20
  Administered 2017-09-20: 140 mg via INTRAVENOUS

## 2017-09-20 MED ORDER — MIDAZOLAM HCL 5 MG/5ML IJ SOLN
INTRAMUSCULAR | Status: DC | PRN
  Administered 2017-09-20: 2 mg via INTRAVENOUS

## 2017-09-20 MED ORDER — FENTANYL CITRATE (PF) 100 MCG/2ML IJ SOLN: 25.0000 ug | INTRAMUSCULAR | Status: DC | PRN

## 2017-09-20 MED ORDER — FENTANYL CITRATE (PF) 100 MCG/2ML IJ SOLN
INTRAMUSCULAR | Status: DC | PRN
  Administered 2017-09-20: 25 ug via INTRAVENOUS

## 2017-09-20 MED ORDER — LIDOCAINE HCL (CARDIAC) 20 MG/ML IV SOLN
INTRAVENOUS | Status: DC | PRN
  Administered 2017-09-20: 50 mg via INTRATRACHEAL

## 2017-09-20 MED ORDER — EPHEDRINE SULFATE 50 MG/ML IJ SOLN
INTRAMUSCULAR | Status: DC | PRN
  Administered 2017-09-20 (×3): 5 mg via INTRAVENOUS

## 2017-09-20 MED ORDER — CEFAZOLIN SODIUM-DEXTROSE 2-4 GM/100ML-% IV SOLN
2.0000 g | INTRAVENOUS | Status: AC
  Administered 2017-09-20: 2 g via INTRAVENOUS

## 2017-09-20 MED ORDER — DEXAMETHASONE SODIUM PHOSPHATE 4 MG/ML IJ SOLN
INTRAMUSCULAR | Status: DC | PRN
  Administered 2017-09-20: 4 mg via INTRAVENOUS

## 2017-09-20 MED ORDER — LACTATED RINGERS IV SOLN
1000.0000 mL | INTRAVENOUS | Status: DC
  Administered 2017-09-20: 16:00:00 via INTRAVENOUS
  Administered 2017-09-20: 1000 mL via INTRAVENOUS

## 2017-09-20 MED ORDER — BUPIVACAINE HCL (PF) 0.25 % IJ SOLN
INTRAMUSCULAR | Status: DC | PRN
  Administered 2017-09-20: 10 mL

## 2017-09-20 MED ORDER — BUPIVACAINE LIPOSOME 1.3 % IJ SUSP
INTRAMUSCULAR | Status: DC | PRN
  Administered 2017-09-20: 10 mL

## 2017-09-20 MED ORDER — OXYCODONE-ACETAMINOPHEN 5-325 MG PO TABS: 1.0000 | ORAL_TABLET | ORAL | 0 refills | Status: DC | PRN

## 2017-09-20 MED ORDER — GLYCOPYRROLATE 0.2 MG/ML IJ SOLN
INTRAMUSCULAR | Status: DC | PRN
Start: 2017-09-20 — End: 2017-09-20
  Administered 2017-09-20: 0.1 mg via INTRAVENOUS

## 2017-09-20 MED ORDER — ONDANSETRON HCL 4 MG/2ML IJ SOLN: 4.0000 mg | Freq: Once | INTRAMUSCULAR | Status: DC | PRN

## 2017-09-20 MED ORDER — POVIDONE-IODINE 7.5 % EX SOLN: Freq: Once | CUTANEOUS | Status: DC

## 2017-09-20 MED ORDER — OXYCODONE HCL 5 MG PO TABS: 5.0000 mg | ORAL_TABLET | Freq: Once | ORAL | Status: DC | PRN

## 2017-09-20 MED ORDER — OXYCODONE HCL 5 MG/5ML PO SOLN: 5.0000 mg | Freq: Once | ORAL | Status: DC | PRN

## 2017-09-20 SURGICAL SUPPLY — 25 items
BANDAGE ELASTIC 4 VELCRO NS (GAUZE/BANDAGES/DRESSINGS) ×2 IMPLANT
BENZOIN TINCTURE PRP APPL 2/3 (GAUZE/BANDAGES/DRESSINGS) ×2 IMPLANT
BNDG COHESIVE 4X5 TAN STRL (GAUZE/BANDAGES/DRESSINGS) ×2 IMPLANT
BNDG ESMARK 4X12 TAN STRL LF (GAUZE/BANDAGES/DRESSINGS) ×2 IMPLANT
BNDG GAUZE 4.5X4.1 6PLY STRL (MISCELLANEOUS) ×2 IMPLANT
BNDG STRETCH 4X75 STRL LF (GAUZE/BANDAGES/DRESSINGS) ×2 IMPLANT
CANISTER SUCT 1200ML W/VALVE (MISCELLANEOUS) ×2 IMPLANT
COVER LIGHT HANDLE UNIVERSAL (MISCELLANEOUS) ×4 IMPLANT
DURAPREP 26ML APPLICATOR (WOUND CARE) ×2 IMPLANT
GAUZE PETRO XEROFOAM 1X8 (MISCELLANEOUS) ×2 IMPLANT
GAUZE SPONGE 4X4 12PLY STRL (GAUZE/BANDAGES/DRESSINGS) ×2 IMPLANT
GLOVE BIO SURGEON STRL SZ7.5 (GLOVE) ×2 IMPLANT
GLOVE INDICATOR 8.0 STRL GRN (GLOVE) ×2 IMPLANT
GOWN STRL REUS W/ TWL LRG LVL3 (GOWN DISPOSABLE) ×2 IMPLANT
GOWN STRL REUS W/TWL LRG LVL3 (GOWN DISPOSABLE) ×2
KIT ROOM TURNOVER OR (KITS) ×2 IMPLANT
NS IRRIG 500ML POUR BTL (IV SOLUTION) ×2 IMPLANT
PACK EXTREMITY ARMC (MISCELLANEOUS) ×2 IMPLANT
PAD GROUND ADULT SPLIT (MISCELLANEOUS) ×2 IMPLANT
PIN BALLS 3/8 F/.045 WIRE (MISCELLANEOUS) ×2 IMPLANT
STOCKINETTE IMPERVIOUS LG (DRAPES) ×2 IMPLANT
STRAP BODY AND KNEE 60X3 (MISCELLANEOUS) ×2 IMPLANT
STRIP CLOSURE SKIN 1/4X4 (GAUZE/BANDAGES/DRESSINGS) ×2 IMPLANT
SUT VIC AB 4-0 SH 27 (SUTURE) ×1
SUT VIC AB 4-0 SH 27XANBCTRL (SUTURE) ×1 IMPLANT

## 2017-09-20 NOTE — Op Note (Signed)
Operative note   Surgeon:Kern Gingras Lawyer: None    Preop diagnosis: Plantar fibroma right foot    Postop diagnosis: Same    Procedure: Radical excision plantar fibroma right foot    EBL: Minimal    Anesthesia:local and general    Hemostasis: Mid calf tourniquet inflated to 200 mmHg for approximately 35 minutes    Specimen: 5 cm x 2 cm plantar fibroma    Complications: None    Operative indications:Stephen [name] is an 71 y.o. that presents today for surgical intervention.  The risks/benefits/alternatives/complications have been discussed and consent has been given.    Procedure:  Patient was brought into the OR and placed on the operating table in thesupine position. After anesthesia was obtained theright lower extremity was prepped and draped in usual sterile fashion.  Attention was directed to the medial aspect of the arch where a 7 cm incision was performed curvilinear from proximal to the first MTPJ into the medial arch area.  Blunt dissection carried down through the subtenons tissue.  At this point firm nodular plantar fibroma was noted.  This was dissected away from the underlying soft tissue and off of the medial band of the plantar fascia.  The lesion measured 5 cm x 2 cm.  The underlying muscle belly and tendons were noted throughout the procedure and left intact.  After removal of the mass.  Wound was flushed with copious amounts of irrigation.  Closure was performed with a 4-0 Vicryl for the subcutaneous tissue and a 5-0 Monocryl undyed for the skin.  A bulky sterile dressing was applied.  Patient had received a 50-50 mixture of 0.25% Marcaine and Exparel long-acting Marcaine.  A total of 20 cc was used.    Patient tolerated the procedure and anesthesia well.  Was transported from the OR to the PACU with all vital signs stable and vascular status intact. To be discharged per routine protocol.  Will follow up in approximately 1 week in the outpatient clinic.

## 2017-09-20 NOTE — Anesthesia Procedure Notes (Signed)
Procedure Name: LMA Insertion Date/Time: 09/20/2017 2:51 PM Performed by: Mayme Genta, CRNA Pre-anesthesia Checklist: Patient identified, Emergency Drugs available, Suction available, Timeout performed and Patient being monitored Patient Re-evaluated:Patient Re-evaluated prior to induction Oxygen Delivery Method: Circle system utilized Preoxygenation: Pre-oxygenation with 100% oxygen Induction Type: IV induction LMA: LMA inserted LMA Size: 4.0 Number of attempts: 1 Placement Confirmation: positive ETCO2 and breath sounds checked- equal and bilateral Tube secured with: Tape

## 2017-09-20 NOTE — Transfer of Care (Signed)
Immediate Anesthesia Transfer of Care Note  Patient: Stephen [name]  Procedure(s) Performed: PLANTAR FASCIA [phone], excision plantar fibroma right foot (Right )  Patient Location: PACU  Anesthesia Type: General LMA  Level of Consciousness: awake, alert  and patient cooperative  Airway and Oxygen Therapy: Patient Spontanous Breathing and Patient connected to supplemental oxygen  Post-op Assessment: Post-op Vital signs reviewed, Patient's Cardiovascular Status Stable, Respiratory Function Stable, Patent Airway and No signs of Nausea or vomiting  Post-op Vital Signs: Reviewed and stable  Complications: No apparent anesthesia complications

## 2017-09-20 NOTE — Anesthesia Preprocedure Evaluation (Addendum)
Anesthesia Evaluation  Patient identified by MRN, date of birth, ID band Patient awake    Reviewed: Allergy & Precautions, H&P , NPO status , Patient's Chart, lab work & pertinent test results, reviewed documented beta blocker date and time   Airway Mallampati: II  TM Distance: >3 FB Neck ROM: full    Dental no notable dental hx.    Pulmonary Current Smoker,    Pulmonary exam normal breath sounds clear to auscultation       Cardiovascular Exercise Tolerance: Good hypertension,  Rhythm:regular Rate:Normal     Neuro/Psych negative neurological ROS  negative psych ROS   GI/Hepatic Neg liver ROS, H/o rectal ca with lung mets, s/p surgery & chemo   Endo/Other  negative endocrine ROS  Renal/GU negative Renal ROS  negative genitourinary   Musculoskeletal   Abdominal   Peds  Hematology negative hematology ROS (+)   Anesthesia Other Findings   Reproductive/Obstetrics negative OB ROS                            Anesthesia Physical Anesthesia Plan  ASA: III  Anesthesia Plan: General LMA   Post-op Pain Management:    Induction:   PONV Risk Score and Plan: 1  Airway Management Planned:   Additional Equipment:   Intra-op Plan:   Post-operative Plan:   Informed Consent: I have reviewed the patients History and Physical, chart, labs and discussed the procedure including the risks, benefits and alternatives for the proposed anesthesia with the patient or authorized representative who has indicated his/her understanding and acceptance.   Dental Advisory Given  Plan Discussed with: CRNA  Anesthesia Plan Comments:        Anesthesia Quick Evaluation

## 2017-09-20 NOTE — H&P (Signed)
HISTORY AND PHYSICAL INTERVAL NOTE:  09/20/2017  2:31 PM  Stephen [name]  has presented today for surgery, with the diagnosis of Plantar fascial fibromatosis.  The various methods of treatment have been discussed with the patient.  No guarantees were given.  After consideration of risks, benefits and other options for treatment, the patient has consented to surgery.  I have reviewed the patients' chart and labs.    Patient Vitals for the past 24 hrs:  BP Temp Pulse SpO2 Weight  09/20/17 1219 (!) 150/100 97.6 F (36.4 C) 74 97 % 91.2 kg (201 lb)    A history and physical examination was performed in my office.  The patient was reexamined.  There have been no changes to this history and physical examination.  Samara Deist A

## 2017-09-21 ENCOUNTER — Encounter: Payer: Self-pay | Admitting: Podiatry

## 2017-09-22 LAB — SURGICAL PATHOLOGY

## 2017-10-28 NOTE — Progress Notes (Signed)
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  INTERVAL HISTORY: Patient returns to clinic today for routine 78-monthfollow-up and laboratory work.  He recently had surgery on his foot and still has some mild tenderness, but otherwise feels well. He also still complains of rectal pain/pressure which is unchanged.  He does not complain of any further bleeding. He has no other neurologic complaints. He denies any chest pain or shortness of breath. He has a good appetite and denies weight loss. He denies any recent fevers. He denies any nausea, vomiting, constipation, or diarrhea. He has no melena or hematochezia. 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 Patient offers no further specific complaints today.  REVIEW OF SYSTEMS:   Review of Systems  Constitutional: Negative.  Negative for fever and malaise/fatigue.  Respiratory: Negative.  Negative for cough, hemoptysis and shortness of breath.   Cardiovascular: Negative.  Negative for chest pain.  Gastrointestinal: Negative for abdominal pain, blood in stool, melena, nausea and vomiting.       Pelvic/rectal pain.  Genitourinary: Negative.   Musculoskeletal: Negative.   Neurological: Positive for sensory change. Negative for weakness.  Psychiatric/Behavioral: Negative.     As per HPI. Otherwise, a complete review of systems is negative.  PAST MEDICAL HISTORY: Past Medical History:  Diagnosis Date  . Allergic rhinitis   . Blood clot in vein 2015   left leg   . History of anemia   . History of chicken pox   . HTN (hypertension)   . Neuropathy     arms and legs - S/P Cancer tx  . Rectal cancer metastasized to lung ([facility] [place] 09-2013   surgery and chemo  . Smoker     PAST SURGICAL HISTORY: Past Surgical History:  Procedure Laterality Date  . COLON RESECTION  2015  . COLONOSCOPY  2015  . EUS N/A 11/15/2013   Procedure: LOWER ENDOSCOPIC ULTRASOUND (EUS);  Surgeon: DMilus Banister MD;  [facility];  Service: Endoscopy;  Laterality: N/A;  . LUNG BIOPSY    . LUNG LOBECTOMY Left   . lung removed  2015   left lower   . PLANTAR FASCIA RELEASE Right 09/20/2017   Procedure: PLANTAR FASCIA [id number] excision plantar fibroma right foot;  Surgeon: FSamara Deist DPM;  [facility] [facility]  Service: Podiatry;  Laterality: Right;  . TONSILLECTOMY AND ADENOIDECTOMY  Childhood  . VARICOSE VEIN SURGERY  2010    FAMILY HISTORY Family History  Problem Relation Age of Onset  . Hypertension Mother   . Coronary artery disease Mother        angina  . Cancer Mother        breast  . Cancer Father 520      esophageal  . COPD Brother        emphysema  . Cancer Paternal Grandmother        colon  . Diabetes Neg Hx        ADVANCED DIRECTIVES:    HEALTH MAINTENANCE: Social History   Tobacco Use  . Smoking status: Current Every Day Smoker    Packs/day: 0.50    Years: 15.00    Pack years: 7.50  Types: Cigarettes  . Smokeless tobacco: Never Used  Substance Use Topics  . Alcohol use: Yes    Alcohol/week: 10.8 oz    Types: 18 Shots of liquor per week    Comment: Regular 2-3 scotch/day  . Drug use: No     Allergies  Allergen Reactions  . No Known Allergies     Current Outpatient Medications  Medication Sig Dispense Refill  . cholecalciferol (VITAMIN D) 400 units TABS tablet Take 400 Units by mouth.    . cyanocobalamin 500 MCG tablet Take 500 mcg by mouth daily.    Marland Kitchen esomeprazole (NEXIUM) 20 MG packet Take 20 mg by mouth daily before breakfast.    . ibuprofen (ADVIL,MOTRIN) 200 MG tablet Take 200 mg by  mouth every 6 (six) hours as needed.    . loratadine (CLARITIN) 10 MG tablet Take 10 mg by mouth daily as needed.    Marland Kitchen olmesartan (BENICAR) 20 MG tablet Take 20 mg by mouth daily.    Marland Kitchen oxyCODONE-acetaminophen (ROXICET) 5-325 MG tablet Take 1-2 tablets every 4 (four) hours as needed by mouth for severe pain. 30 tablet 0  . polyethylene glycol (MIRALAX / GLYCOLAX) packet Take by mouth daily as needed.     . sildenafil (REVATIO) 20 MG tablet Take 1 - 5 tablets as needed 30 tablet 5   No current facility-administered medications for this visit.     OBJECTIVE: Vitals:   10/30/17 1547  BP: (!) 150/100  Resp: 18  Temp: 97.7 F (36.5 C)     Body mass index is 32.14 kg/m.    ECOG FS:0 - Asymptomatic  General: Well-developed, well-nourished, no acute distress. Eyes: anicteric sclera. Lungs: Clear to auscultation bilaterally. Heart: Regular rate and rhythm. No rubs, murmurs, or gallops. Abdomen: Soft, nontender, nondistended. Normoactive bowel sounds. Colostomy noted. Musculoskeletal: No edema, cyanosis, or clubbing. Neuro: Alert, answering all questions appropriately. Cranial nerves grossly intact. Skin: No rashes or petechiae noted. Psych: Normal affect.   LAB RESULTS:  Lab Results  Component Value Date   NA 131 (L) 10/30/2017   K 4.4 10/30/2017   CL 99 (L) 10/30/2017   CO2 23 10/30/2017   GLUCOSE 105 (H) 10/30/2017   BUN 13 10/30/2017   CREATININE 1.40 (H) 10/30/2017   CALCIUM 9.1 10/30/2017   PROT 7.3 10/30/2017   ALBUMIN 4.2 10/30/2017   AST 33 10/30/2017   ALT 21 10/30/2017   ALKPHOS 55 10/30/2017   BILITOT 0.9 10/30/2017   GFRNONAA 49 (L) 10/30/2017   GFRAA 57 (L) 10/30/2017    Lab Results  Component Value Date   WBC 6.8 10/30/2017   NEUTROABS 4.9 04/25/2017   HGB 14.8 10/30/2017   HCT 43.0 10/30/2017   MCV 113.2 (H) 10/30/2017   PLT 175 10/30/2017   Lab Results  Component Value Date   CEA 2.7 04/25/2017     STUDIES: No results found.  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   PLAN:    1. 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 CEA continues to be within normal limits, today's result is pending. Patient had completed his adjuvant chemotherapy on December 09, 2014 and now can be switched to yearly imaging until 2021. Return to clinic in 6 months for repeat laboratory work, imaging, and further evaluation.   2. Peripheral neuropathy: Patient was previously given gabapentin,  but never filled the prescription.  He recently had surgery with podiatry, but patient admits it is too soon to tell if this improves his symptoms.  Continue follow-up with podiatry as indicated. 3. Rectal Pain/Pressure: Unchanged. Secondary to surgery. 4. Elevated MCV: Likely residual from chemotherapy several years ago. Patient is not anemic. Monitor.  Approximately 20 minutes was spent in discussion of which greater than 50% was consultation.  Patient expressed understanding and was in agreement with this plan. He also understands that He can call clinic at any time with any questions, concerns, or complaints.    Lloyd Huger, MD   10/31/2017 3:18 PM

## 2017-10-30 ENCOUNTER — Inpatient Hospital Stay (HOSPITAL_BASED_OUTPATIENT_CLINIC_OR_DEPARTMENT_OTHER): Payer: Medicare HMO | Admitting: Oncology

## 2017-10-30 ENCOUNTER — Inpatient Hospital Stay: Payer: Medicare HMO | Attending: Oncology

## 2017-10-30 VITALS — BP 150/100 | Temp 97.7°F | Resp 18 | Wt 205.2 lb

## 2017-10-30 DIAGNOSIS — Z8619 Personal history of other infectious and parasitic diseases: Secondary | ICD-10-CM | POA: Insufficient documentation

## 2017-10-30 DIAGNOSIS — Z79899 Other long term (current) drug therapy: Secondary | ICD-10-CM

## 2017-10-30 DIAGNOSIS — Z800 Family history of malignant neoplasm of digestive organs: Secondary | ICD-10-CM

## 2017-10-30 DIAGNOSIS — G893 Neoplasm related pain (acute) (chronic): Secondary | ICD-10-CM | POA: Insufficient documentation

## 2017-10-30 DIAGNOSIS — Z933 Colostomy status: Secondary | ICD-10-CM | POA: Insufficient documentation

## 2017-10-30 DIAGNOSIS — G629 Polyneuropathy, unspecified: Secondary | ICD-10-CM | POA: Insufficient documentation

## 2017-10-30 DIAGNOSIS — C20 Malignant neoplasm of rectum: Secondary | ICD-10-CM | POA: Insufficient documentation

## 2017-10-30 DIAGNOSIS — F17210 Nicotine dependence, cigarettes, uncomplicated: Secondary | ICD-10-CM | POA: Diagnosis not present

## 2017-10-30 DIAGNOSIS — R7989 Other specified abnormal findings of blood chemistry: Secondary | ICD-10-CM

## 2017-10-30 DIAGNOSIS — I10 Essential (primary) hypertension: Secondary | ICD-10-CM

## 2017-10-30 DIAGNOSIS — Z803 Family history of malignant neoplasm of breast: Secondary | ICD-10-CM

## 2017-10-30 DIAGNOSIS — Z9221 Personal history of antineoplastic chemotherapy: Secondary | ICD-10-CM

## 2017-10-30 DIAGNOSIS — C7802 Secondary malignant neoplasm of left lung: Secondary | ICD-10-CM | POA: Diagnosis not present

## 2017-10-30 LAB — COMPREHENSIVE METABOLIC PANEL
ALK PHOS: 55 U/L (ref 38–126)
ALT: 21 U/L (ref 17–63)
AST: 33 U/L (ref 15–41)
Albumin: 4.2 g/dL (ref 3.5–5.0)
Anion gap: 9 (ref 5–15)
BILIRUBIN TOTAL: 0.9 mg/dL (ref 0.3–1.2)
BUN: 13 mg/dL (ref 6–20)
CALCIUM: 9.1 mg/dL (ref 8.9–10.3)
CO2: 23 mmol/L (ref 22–32)
CREATININE: 1.4 mg/dL — AB (ref 0.61–1.24)
Chloride: 99 mmol/L — ABNORMAL LOW (ref 101–111)
GFR, EST AFRICAN AMERICAN: 57 mL/min — AB (ref >60)
GFR, EST NON AFRICAN AMERICAN: 49 mL/min — AB (ref >60)
Glucose, Bld: 105 mg/dL — ABNORMAL HIGH (ref 65–99)
Potassium: 4.4 mmol/L (ref 3.5–5.1)
Sodium: 131 mmol/L — ABNORMAL LOW (ref 135–145)
TOTAL PROTEIN: 7.3 g/dL (ref 6.5–8.1)

## 2017-10-30 LAB — CBC
HCT: 43 % (ref 40.0–52.0)
Hemoglobin: 14.8 g/dL (ref 13.0–18.0)
MCH: 39 pg — ABNORMAL HIGH (ref 26.0–34.0)
MCHC: 34.5 g/dL (ref 32.0–36.0)
MCV: 113.2 fL — AB (ref 80.0–100.0)
PLATELETS: 175 10*3/uL (ref 150–440)
RBC: 3.8 MIL/uL — AB (ref 4.40–5.90)
RDW: 15.7 % — AB (ref 11.5–14.5)
WBC: 6.8 10*3/uL (ref 3.8–10.6)

## 2017-10-30 NOTE — Progress Notes (Signed)
Checked BP : left arm 150/100  Rechecked BP at 4:05 166/109

## 2017-10-31 LAB — CEA: CEA: 2.5 ng/mL (ref 0.0–4.7)

## 2017-11-01 NOTE — Anesthesia Postprocedure Evaluation (Signed)
Anesthesia Post Note  Patient: Stephen [name]  Procedure(s) Performed: PLANTAR FASCIA [phone], excision plantar fibroma right foot (Right )  Patient location during evaluation: PACU Anesthesia Type: General Level of consciousness: awake and alert Pain management: pain level controlled Vital Signs Assessment: post-procedure vital signs reviewed and stable Respiratory status: spontaneous breathing, nonlabored ventilation, respiratory function stable and patient connected to nasal cannula oxygen Cardiovascular status: blood pressure returned to baseline and stable Postop Assessment: no apparent nausea or vomiting Anesthetic complications: no    Alisa Graff

## 2018-05-02 ENCOUNTER — Other Ambulatory Visit: Payer: Self-pay | Admitting: *Deleted

## 2018-05-02 DIAGNOSIS — C20 Malignant neoplasm of rectum: Secondary | ICD-10-CM

## 2018-05-03 ENCOUNTER — Inpatient Hospital Stay: Payer: Medicare HMO | Attending: Oncology

## 2018-05-03 ENCOUNTER — Ambulatory Visit
Admission: RE | Admit: 2018-05-03 | Discharge: 2018-05-03 | Disposition: A | Discharge status: Home / Self Care | Payer: Medicare HMO | Source: Ambulatory Visit | Attending: Oncology | Admitting: Oncology

## 2018-05-03 DIAGNOSIS — C20 Malignant neoplasm of rectum: Secondary | ICD-10-CM | POA: Insufficient documentation

## 2018-05-03 DIAGNOSIS — N281 Cyst of kidney, acquired: Secondary | ICD-10-CM | POA: Diagnosis not present

## 2018-05-03 DIAGNOSIS — I10 Essential (primary) hypertension: Secondary | ICD-10-CM | POA: Diagnosis not present

## 2018-05-03 DIAGNOSIS — R918 Other nonspecific abnormal finding of lung field: Secondary | ICD-10-CM | POA: Insufficient documentation

## 2018-05-03 DIAGNOSIS — G629 Polyneuropathy, unspecified: Secondary | ICD-10-CM | POA: Diagnosis not present

## 2018-05-03 DIAGNOSIS — I2510 Atherosclerotic heart disease of native coronary artery without angina pectoris: Secondary | ICD-10-CM | POA: Insufficient documentation

## 2018-05-03 DIAGNOSIS — F17210 Nicotine dependence, cigarettes, uncomplicated: Secondary | ICD-10-CM | POA: Insufficient documentation

## 2018-05-03 DIAGNOSIS — J432 Centrilobular emphysema: Secondary | ICD-10-CM | POA: Diagnosis not present

## 2018-05-03 DIAGNOSIS — Z85048 Personal history of other malignant neoplasm of rectum, rectosigmoid junction, and anus: Secondary | ICD-10-CM | POA: Diagnosis present

## 2018-05-03 DIAGNOSIS — I700 Atherosclerosis of aorta: Secondary | ICD-10-CM | POA: Diagnosis not present

## 2018-05-03 DIAGNOSIS — Z800 Family history of malignant neoplasm of digestive organs: Secondary | ICD-10-CM | POA: Diagnosis not present

## 2018-05-03 DIAGNOSIS — Z803 Family history of malignant neoplasm of breast: Secondary | ICD-10-CM | POA: Diagnosis not present

## 2018-05-03 DIAGNOSIS — J439 Emphysema, unspecified: Secondary | ICD-10-CM | POA: Diagnosis not present

## 2018-05-03 DIAGNOSIS — Z79899 Other long term (current) drug therapy: Secondary | ICD-10-CM | POA: Diagnosis not present

## 2018-05-03 DIAGNOSIS — Z933 Colostomy status: Secondary | ICD-10-CM | POA: Insufficient documentation

## 2018-05-03 DIAGNOSIS — J438 Other emphysema: Secondary | ICD-10-CM | POA: Diagnosis not present

## 2018-05-03 LAB — CBC WITH DIFFERENTIAL/PLATELET
Basophils Absolute: 0.1 K/uL (ref 0–0.1)
Basophils Relative: 2 %
Eosinophils Absolute: 0.2 K/uL (ref 0–0.7)
Eosinophils Relative: 4 %
HCT: 42.5 % (ref 40.0–52.0)
Hemoglobin: 15 g/dL (ref 13.0–18.0)
Lymphocytes Relative: 14 %
Lymphs Abs: 0.9 K/uL — ABNORMAL LOW (ref 1.0–3.6)
MCH: 42.1 pg — ABNORMAL HIGH (ref 26.0–34.0)
MCHC: 35.3 g/dL (ref 32.0–36.0)
MCV: 119.2 fL — ABNORMAL HIGH (ref 80.0–100.0)
Monocytes Absolute: 0.7 K/uL (ref 0.2–1.0)
Monocytes Relative: 11 %
Neutro Abs: 4.4 K/uL (ref 1.4–6.5)
Neutrophils Relative %: 69 %
Platelets: 221 K/uL (ref 150–440)
RBC: 3.57 MIL/uL — ABNORMAL LOW (ref 4.40–5.90)
RDW: 16.4 % — ABNORMAL HIGH (ref 11.5–14.5)
WBC: 6.3 K/uL (ref 3.8–10.6)

## 2018-05-03 LAB — COMPREHENSIVE METABOLIC PANEL WITH GFR
ALT: 25 U/L (ref 17–63)
AST: 41 U/L (ref 15–41)
Albumin: 3.9 g/dL (ref 3.5–5.0)
Alkaline Phosphatase: 62 U/L (ref 38–126)
Anion gap: 9 (ref 5–15)
BUN: 13 mg/dL (ref 6–20)
CO2: 22 mmol/L (ref 22–32)
Calcium: 9.3 mg/dL (ref 8.9–10.3)
Chloride: 102 mmol/L (ref 101–111)
Creatinine, Ser: 1.16 mg/dL (ref 0.61–1.24)
GFR calc Af Amer: >60 mL/min
GFR calc non Af Amer: >60 mL/min
Glucose, Bld: 108 mg/dL — ABNORMAL HIGH (ref 65–99)
Potassium: 4.2 mmol/L (ref 3.5–5.1)
Sodium: 133 mmol/L — ABNORMAL LOW (ref 135–145)
Total Bilirubin: 0.8 mg/dL (ref 0.3–1.2)
Total Protein: 7.1 g/dL (ref 6.5–8.1)

## 2018-05-03 MED ORDER — IOPAMIDOL (ISOVUE-300) INJECTION 61%
100.0000 mL | Freq: Once | INTRAVENOUS | Status: AC | PRN
  Administered 2018-05-03: 100 mL via INTRAVENOUS

## 2018-05-04 LAB — CEA: CEA: 1.8 ng/mL (ref 0.0–4.7)

## 2018-05-07 ENCOUNTER — Ambulatory Visit: Payer: Medicare HMO | Admitting: Oncology

## 2018-05-07 NOTE — Progress Notes (Signed)
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 K-ras mutation positive.  INTERVAL HISTORY: Patient returns to clinic today for routine six-month follow-up, laboratory work, and discussion of his imaging results.  He currently feels well and is asymptomatic.  He continues to have a peripheral neuropathy that is chronic and unchanged.  It does not affect his day-to-day activity.  He has no other neurologic complaints.  He denies any chest pain or shortness of breath. He has a good appetite and denies weight loss. He denies any recent fevers. He denies any nausea, vomiting, constipation, or diarrhea. He has no melena or hematochezia. 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  Patient feels at his baseline offers no further specific complaints today.  REVIEW OF SYSTEMS:   Review of Systems  Constitutional: Negative.  Negative for fever, malaise/fatigue and weight loss.  Respiratory: Negative.  Negative for cough, hemoptysis and shortness of breath.   Cardiovascular: Negative.  Negative for chest pain and leg swelling.  Gastrointestinal: Negative for abdominal pain, blood in stool, melena, nausea and vomiting.  Genitourinary: Negative.  Negative for dysuria.  Musculoskeletal: Negative.  Negative for back pain.  Skin: Negative.  Negative for rash.  Neurological: Positive for sensory change. Negative for focal weakness and weakness.  Psychiatric/Behavioral: Negative.  The patient is not nervous/anxious.     As per HPI. Otherwise, a complete review of systems is negative.  PAST MEDICAL HISTORY: Past Medical History:    Diagnosis Date  . Allergic rhinitis   . Blood clot in vein 2015   left leg   . History of anemia   . History of chicken pox   . HTN (hypertension)   . Neuropathy    arms and legs - S/P Cancer tx  . Rectal cancer metastasized to lung [facility]) 09-2013   surgery and chemo  . Smoker     PAST SURGICAL HISTORY: Past Surgical History:  Procedure Laterality Date  . COLON RESECTION  2015  . COLONOSCOPY  2015  . EUS N/A 11/15/2013   Procedure: LOWER ENDOSCOPIC ULTRASOUND (EUS);  Surgeon: Milus Banister, MD;  Location: [facility];  Service: Endoscopy;  Laterality: N/A;  . LUNG BIOPSY    . LUNG LOBECTOMY Left   . lung removed  2015   left lower   . PLANTAR FASCIA RELEASE Right 09/20/2017   Procedure: PLANTAR FASCIA [id number], excision plantar fibroma right foot;  Surgeon: Samara Deist, DPM;  Location: [facility];  Service: Podiatry;  Laterality: Right;  . TONSILLECTOMY AND ADENOIDECTOMY  Childhood  . VARICOSE VEIN SURGERY  2010    FAMILY HISTORY Family History  Problem Relation Age of Onset  . Hypertension Mother   . Coronary artery disease Mother        angina  . Cancer Mother        breast  . Cancer Father 67       esophageal  . COPD Brother        emphysema  . Cancer Paternal Grandmother        colon  . Diabetes Neg Hx        ADVANCED DIRECTIVES:    HEALTH MAINTENANCE: Social History   Tobacco  Use  . Smoking status: Current Every Day Smoker    Packs/day: 0.50    Years: 15.00    Pack years: 7.50    Types: Cigarettes  . Smokeless tobacco: Never Used  Substance Use Topics  . Alcohol use: Yes    Alcohol/week: 10.8 oz    Types: 18 Shots of liquor per week    Comment: Regular 2-3 scotch/day  . Drug use: No     Allergies  Allergen Reactions  . No Known Allergies     Current Outpatient Medications  Medication Sig Dispense Refill  . bisacodyl (DULCOLAX) 5 MG EC tablet Take 5 mg by mouth daily as needed for moderate constipation.    .  cholecalciferol (VITAMIN D) 400 units TABS tablet Take 400 Units by mouth.    . esomeprazole (NEXIUM) 20 MG packet Take 20 mg by mouth daily before breakfast.    . ibuprofen (ADVIL,MOTRIN) 200 MG tablet Take 200 mg by mouth every 6 (six) hours as needed.    . loratadine (CLARITIN) 10 MG tablet Take 10 mg by mouth daily as needed.    Marland Kitchen olmesartan (BENICAR) 20 MG tablet Take 20 mg by mouth daily.    . cyanocobalamin 500 MCG tablet Take 500 mcg by mouth daily.    . polyethylene glycol (MIRALAX / GLYCOLAX) packet Take by mouth daily as needed.     . sildenafil (REVATIO) 20 MG tablet Take 1 - 5 tablets as needed (Patient not taking: Reported on 05/08/2018) 30 tablet 5   No current facility-administered medications for this visit.     OBJECTIVE: Vitals:   05/08/18 1153  BP: 135/85  Pulse: 82  Resp: 18  Temp: (!) 97.5 F (36.4 C)     Body mass index is 31.82 kg/m.    ECOG FS:0 - Asymptomatic  General: Well-developed, well-nourished, no acute distress. Eyes: Pink conjunctiva, anicteric sclera. Lungs: Clear to auscultation bilaterally. Heart: Regular rate and rhythm. No rubs, murmurs, or gallops. Abdomen: Soft, nontender.  Colostomy noted. Musculoskeletal: No edema, cyanosis, or clubbing. Neuro: Alert, answering all questions appropriately. Cranial nerves grossly intact. Skin: No rashes or petechiae noted. Psych: Normal affect.  LAB RESULTS:  Lab Results  Component Value Date   NA 133 (L) 05/03/2018   K 4.2 05/03/2018   CL 102 05/03/2018   CO2 22 05/03/2018   GLUCOSE 108 (H) 05/03/2018   BUN 13 05/03/2018   CREATININE 1.16 05/03/2018   CALCIUM 9.3 05/03/2018   PROT 7.1 05/03/2018   ALBUMIN 3.9 05/03/2018   AST 41 05/03/2018   ALT 25 05/03/2018   ALKPHOS 62 05/03/2018   BILITOT 0.8 05/03/2018   GFRNONAA >60 05/03/2018   GFRAA >60 05/03/2018    Lab Results  Component Value Date   WBC 6.3 05/03/2018   NEUTROABS 4.4 05/03/2018   HGB 15.0 05/03/2018   HCT 42.5 05/03/2018     MCV 119.2 (H) 05/03/2018   PLT 221 05/03/2018   Lab Results  Component Value Date   CEA 2.7 04/25/2017     STUDIES: Ct Chest W Contrast  Result Date: 05/03/2018 CLINICAL DATA:  72 year old male with history of stage IV rectal cancer with metastatic disease to the left lung. Follow-up study. EXAM: CT CHEST, ABDOMEN, AND PELVIS WITH CONTRAST TECHNIQUE: Multidetector CT imaging of the chest, abdomen and pelvis was performed following the standard protocol during bolus administration of intravenous contrast. CONTRAST:  118m ISOVUE-300 IOPAMIDOL (ISOVUE-300) INJECTION 61% COMPARISON:  CT of the chest, abdomen and pelvis 04/25/2017. FINDINGS: CT  CHEST FINDINGS Cardiovascular: Heart size is normal. There is no significant pericardial fluid, thickening or pericardial calcification. There is aortic atherosclerosis, as well as atherosclerosis of the great vessels of the mediastinum and the coronary arteries, including calcified atherosclerotic plaque in the left main, left anterior descending and right coronary arteries. In addition, there is a prominent eccentric noncalcified atheromatous plaque in the proximal left subclavian artery with moderate luminal narrowing (axial images 10 and 11 of series 2), similar to the prior study. Mediastinum/Nodes: No pathologically enlarged mediastinal or hilar lymph nodes. Esophagus is unremarkable in appearance. No axillary lymphadenopathy. Lungs/Pleura: Diffuse bronchial wall thickening with mild to moderate centrilobular and paraseptal emphysema. Patchy areas of peripheral predominant ground-glass attenuation are scattered throughout the lungs bilaterally, increased compared to the prior study, concerning for developing interstitial lung disease. Some septal thickening and potential early honeycombing is also noted, most evident in the anterior aspect of the right upper lobe (axial image 29 of series 3). No confluent consolidative airspace disease. No pleural effusions.  New 5 mm right upper lobe nodule (axial image 25 of series 3). 3 mm left upper lobe nodule (axial image 23 of series 3) is unchanged. No other larger more suspicious appearing pulmonary nodules or masses are noted. Postoperative changes are noted in the superior segment of the left lower lobe related to prior wedge resection. Musculoskeletal: Multiple old healed left-sided rib fractures. There are no aggressive appearing lytic or blastic lesions noted in the visualized portions of the skeleton. CT ABDOMEN PELVIS FINDINGS Hepatobiliary: Tiny subcentimeter low-attenuation lesions in segments 2 and 4A, too small to characterize, but similar to prior CT 10/18/2016, statistically likely benign. No larger more suspicious appearing cystic or solid hepatic lesions. No intra or extrahepatic biliary ductal dilatation. Gallbladder is normal in appearance. Pancreas: No pancreatic mass. No pancreatic ductal dilatation. No pancreatic or peripancreatic fluid or inflammatory changes. Spleen: Unremarkable. Adrenals/Urinary Tract: 2 simple cysts in the right kidney, largest of which is 2.5 cm in the posterior aspect of the interpolar region. Left kidney and right adrenal gland are normal in appearance. In the inferior aspect of the left adrenal gland there is a 1.5 cm nodule, similar to prior examinations dating back to 2015, likely to represent a benign lesion such as a small adenoma. No hydroureteronephrosis. Urinary bladder is normal in appearance. Stomach/Bowel: Normal appearance of the stomach. No pathologic dilatation of small bowel or colon. Normal appendix. Status post low anterior resection with left lower quadrant colostomy. Vascular/Lymphatic: Aortic atherosclerosis, without evidence of aneurysm or dissection in the abdominal or pelvic vasculature. No lymphadenopathy noted in the abdomen or pelvis. Reproductive: Prostate gland and seminal vesicles are unremarkable in appearance. Other: No significant volume of ascites.  No  pneumoperitoneum. Musculoskeletal: There are no aggressive appearing lytic or blastic lesions noted in the visualized portions of the skeleton. IMPRESSION: 1. New 5 mm right upper lobe nodule (axial image 25 of series 3), highly nonspecific. The possibility of a metastatic lesion is not excluded, but is not strongly favored. Close attention on follow-up studies is recommended to ensure the stability or resolution of this finding. 2. No other definite findings to strongly suggest developing metastatic disease elsewhere in the chest, abdomen or pelvis. 3. However, there is some worsening patchy ground-glass attenuation and areas of septal thickening in the lungs bilaterally, concerning for developing interstitial lung disease. Outpatient referral to Pulmonology for further evaluation and follow-up nonemergent high-resolution chest CT should be considered if clinically appropriate. 4. Aortic atherosclerosis, in addition to left  main and 2 vessel coronary artery disease. Assessment for potential risk factor modification, dietary therapy or pharmacologic therapy may be warranted, if clinically indicated. 5. Diffuse bronchial wall thickening with mild to moderate centrilobular and paraseptal emphysema; imaging findings suggestive of underlying COPD. 6. Additional incidental findings, as above. Aortic Atherosclerosis (ICD10-I70.0) and Emphysema (ICD10-J43.9). Electronically Signed   By: Vinnie Langton M.D.   On: 05/03/2018 14:33   Ct Abdomen Pelvis W Contrast  Result Date: 05/03/2018 CLINICAL DATA:  72 year old male with history of stage IV rectal cancer with metastatic disease to the left lung. Follow-up study. EXAM: CT CHEST, ABDOMEN, AND PELVIS WITH CONTRAST TECHNIQUE: Multidetector CT imaging of the chest, abdomen and pelvis was performed following the standard protocol during bolus administration of intravenous contrast. CONTRAST:  114m ISOVUE-300 IOPAMIDOL (ISOVUE-300) INJECTION 61% COMPARISON:  CT of the  chest, abdomen and pelvis 04/25/2017. FINDINGS: CT CHEST FINDINGS Cardiovascular: Heart size is normal. There is no significant pericardial fluid, thickening or pericardial calcification. There is aortic atherosclerosis, as well as atherosclerosis of the great vessels of the mediastinum and the coronary arteries, including calcified atherosclerotic plaque in the left main, left anterior descending and right coronary arteries. In addition, there is a prominent eccentric noncalcified atheromatous plaque in the proximal left subclavian artery with moderate luminal narrowing (axial images 10 and 11 of series 2), similar to the prior study. Mediastinum/Nodes: No pathologically enlarged mediastinal or hilar lymph nodes. Esophagus is unremarkable in appearance. No axillary lymphadenopathy. Lungs/Pleura: Diffuse bronchial wall thickening with mild to moderate centrilobular and paraseptal emphysema. Patchy areas of peripheral predominant ground-glass attenuation are scattered throughout the lungs bilaterally, increased compared to the prior study, concerning for developing interstitial lung disease. Some septal thickening and potential early honeycombing is also noted, most evident in the anterior aspect of the right upper lobe (axial image 29 of series 3). No confluent consolidative airspace disease. No pleural effusions. New 5 mm right upper lobe nodule (axial image 25 of series 3). 3 mm left upper lobe nodule (axial image 23 of series 3) is unchanged. No other larger more suspicious appearing pulmonary nodules or masses are noted. Postoperative changes are noted in the superior segment of the left lower lobe related to prior wedge resection. Musculoskeletal: Multiple old healed left-sided rib fractures. There are no aggressive appearing lytic or blastic lesions noted in the visualized portions of the skeleton. CT ABDOMEN PELVIS FINDINGS Hepatobiliary: Tiny subcentimeter low-attenuation lesions in segments 2 and 4A, too  small to characterize, but similar to prior CT 10/18/2016, statistically likely benign. No larger more suspicious appearing cystic or solid hepatic lesions. No intra or extrahepatic biliary ductal dilatation. Gallbladder is normal in appearance. Pancreas: No pancreatic mass. No pancreatic ductal dilatation. No pancreatic or peripancreatic fluid or inflammatory changes. Spleen: Unremarkable. Adrenals/Urinary Tract: 2 simple cysts in the right kidney, largest of which is 2.5 cm in the posterior aspect of the interpolar region. Left kidney and right adrenal gland are normal in appearance. In the inferior aspect of the left adrenal gland there is a 1.5 cm nodule, similar to prior examinations dating back to 2015, likely to represent a benign lesion such as a small adenoma. No hydroureteronephrosis. Urinary bladder is normal in appearance. Stomach/Bowel: Normal appearance of the stomach. No pathologic dilatation of small bowel or colon. Normal appendix. Status post low anterior resection with left lower quadrant colostomy. Vascular/Lymphatic: Aortic atherosclerosis, without evidence of aneurysm or dissection in the abdominal or pelvic vasculature. No lymphadenopathy noted in the abdomen or pelvis. Reproductive:  Prostate gland and seminal vesicles are unremarkable in appearance. Other: No significant volume of ascites.  No pneumoperitoneum. Musculoskeletal: There are no aggressive appearing lytic or blastic lesions noted in the visualized portions of the skeleton. IMPRESSION: 1. New 5 mm right upper lobe nodule (axial image 25 of series 3), highly nonspecific. The possibility of a metastatic lesion is not excluded, but is not strongly favored. Close attention on follow-up studies is recommended to ensure the stability or resolution of this finding. 2. No other definite findings to strongly suggest developing metastatic disease elsewhere in the chest, abdomen or pelvis. 3. However, there is some worsening patchy  ground-glass attenuation and areas of septal thickening in the lungs bilaterally, concerning for developing interstitial lung disease. Outpatient referral to Pulmonology for further evaluation and follow-up nonemergent high-resolution chest CT should be considered if clinically appropriate. 4. Aortic atherosclerosis, in addition to left main and 2 vessel coronary artery disease. Assessment for potential risk factor modification, dietary therapy or pharmacologic therapy may be warranted, if clinically indicated. 5. Diffuse bronchial wall thickening with mild to moderate centrilobular and paraseptal emphysema; imaging findings suggestive of underlying COPD. 6. Additional incidental findings, as above. Aortic Atherosclerosis (ICD10-I70.0) and Emphysema (ICD10-J43.9). Electronically Signed   By: Vinnie Langton M.D.   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   PLAN:    1. 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 K-ras mutation positive:  Patient had completed his adjuvant chemotherapy on December 09, 2014.  CT scan results from May 03, 2018 reviewed independently and report as above with no obvious evidence of progressive or recurrent disease.  He was noted to have a new 5 mm right upper lobe nodule that will require monitoring.  His CEA continues to be within normal limits at 1.8.  Return to clinic in 6 months with repeat imaging, laboratory, and further evaluation.   2. Peripheral neuropathy: Chronic and unchanged.  Patient no longer takes any medications that were recommended or prescribed.   3. Rectal Pain/Pressure: Patient does not complain of this today.  Likely secondary to surgery. 4. Elevated MCV: Chronic and unchanged. Possibly secondary to history of chemotherapy. 5.  Pulmonary nodule: Repeat imaging in 6 months as above.  Patient expressed understanding and was in agreement with this plan. He  also understands that He can call clinic at any time with any questions, concerns, or complaints.    Lloyd Huger, MD   05/11/2018 12:04 PM

## 2018-05-08 ENCOUNTER — Encounter: Payer: Self-pay | Admitting: Oncology

## 2018-05-08 ENCOUNTER — Inpatient Hospital Stay (HOSPITAL_BASED_OUTPATIENT_CLINIC_OR_DEPARTMENT_OTHER): Payer: Medicare HMO | Admitting: Oncology

## 2018-05-08 VITALS — BP 135/85 | HR 82 | Temp 97.5°F | Resp 18 | Wt 203.2 lb

## 2018-05-08 DIAGNOSIS — I2510 Atherosclerotic heart disease of native coronary artery without angina pectoris: Secondary | ICD-10-CM

## 2018-05-08 DIAGNOSIS — I10 Essential (primary) hypertension: Secondary | ICD-10-CM

## 2018-05-08 DIAGNOSIS — C20 Malignant neoplasm of rectum: Secondary | ICD-10-CM

## 2018-05-08 DIAGNOSIS — J439 Emphysema, unspecified: Secondary | ICD-10-CM

## 2018-05-08 DIAGNOSIS — Z79899 Other long term (current) drug therapy: Secondary | ICD-10-CM

## 2018-05-08 DIAGNOSIS — Z85048 Personal history of other malignant neoplasm of rectum, rectosigmoid junction, and anus: Secondary | ICD-10-CM

## 2018-05-08 DIAGNOSIS — I700 Atherosclerosis of aorta: Secondary | ICD-10-CM | POA: Diagnosis not present

## 2018-05-08 DIAGNOSIS — Z803 Family history of malignant neoplasm of breast: Secondary | ICD-10-CM

## 2018-05-08 DIAGNOSIS — G629 Polyneuropathy, unspecified: Secondary | ICD-10-CM

## 2018-05-08 DIAGNOSIS — F17210 Nicotine dependence, cigarettes, uncomplicated: Secondary | ICD-10-CM

## 2018-05-08 DIAGNOSIS — Z800 Family history of malignant neoplasm of digestive organs: Secondary | ICD-10-CM

## 2018-05-08 DIAGNOSIS — R918 Other nonspecific abnormal finding of lung field: Secondary | ICD-10-CM

## 2018-05-08 DIAGNOSIS — Z933 Colostomy status: Secondary | ICD-10-CM

## 2018-05-08 DIAGNOSIS — N281 Cyst of kidney, acquired: Secondary | ICD-10-CM

## 2018-05-08 NOTE — Progress Notes (Signed)
Pt in for follow up reports "I just don"t ever feel good".

## 2018-10-28 NOTE — Progress Notes (Signed)
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 K-ras mutation positive.  INTERVAL HISTORY: Patient returns to clinic today for further evaluation and discussion of his imaging results.  He continues to have a peripheral neuropathy that is chronic and unchanged.  He otherwise feels well and is asymptomatic. He has no other neurologic complaints.  He denies any chest pain or shortness of breath. He has a good appetite and denies weight loss. He denies any recent fevers. He denies any nausea, vomiting, constipation, or diarrhea. He has no melena or hematochezia. 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  Patient feels at his baseline offers no further specific complaints today.  REVIEW OF SYSTEMS:   Review of Systems  Constitutional: Negative.  Negative for fever, malaise/fatigue and weight loss.  Respiratory: Negative.  Negative for cough, hemoptysis and shortness of breath.   Cardiovascular: Negative.  Negative for chest pain and leg swelling.  Gastrointestinal: Negative for abdominal pain, blood in stool, melena, nausea and vomiting.  Genitourinary: Negative.  Negative for dysuria.  Musculoskeletal: Negative.  Negative for back pain.  Skin: Negative.  Negative for rash.  Neurological: Positive for sensory change. Negative for focal weakness and weakness.  Psychiatric/Behavioral: Negative.  The patient is not nervous/anxious.     As per HPI. Otherwise, a complete review of systems is negative.  PAST MEDICAL HISTORY: Past Medical History:  Diagnosis Date  . Allergic rhinitis   . Blood clot in vein 2015   left leg     . History of anemia   . History of chicken pox   . HTN (hypertension)   . Neuropathy    arms and legs - S/P Cancer tx  . Rectal cancer metastasized to lung [facility]) 09-2013   surgery and chemo  . Smoker     PAST SURGICAL HISTORY: Past Surgical History:  Procedure Laterality Date  . COLON RESECTION  2015  . COLONOSCOPY  2015  . EUS N/A 11/15/2013   Procedure: LOWER ENDOSCOPIC ULTRASOUND (EUS);  Surgeon: Milus Banister, MD;  Location: [facility];  Service: Endoscopy;  Laterality: N/A;  . LUNG BIOPSY    . LUNG LOBECTOMY Left   . lung removed  2015   left lower   . PLANTAR FASCIA RELEASE Right 09/20/2017   Procedure: PLANTAR FASCIA [id number], excision plantar fibroma right foot;  Surgeon: Samara Deist, DPM;  Location: [place];  Service: Podiatry;  Laterality: Right;  . TONSILLECTOMY AND ADENOIDECTOMY  Childhood  . VARICOSE VEIN SURGERY  2010    FAMILY HISTORY Family History  Problem Relation Age of Onset  . Hypertension Mother   . Coronary artery disease Mother        angina  . Cancer Mother        breast  . Cancer Father 9       esophageal  . COPD Brother        emphysema  . Cancer Paternal Grandmother        colon  . Diabetes Neg Hx        ADVANCED DIRECTIVES:    HEALTH MAINTENANCE: Social History   Tobacco Use  . Smoking status: Current Every Day Smoker  Packs/day: 0.50    Years: 15.00    Pack years: 7.50    Types: Cigarettes  . Smokeless tobacco: Never Used  Substance Use Topics  . Alcohol use: Yes    Alcohol/week: 18.0 standard drinks    Types: 18 Shots of liquor per week    Comment: Regular 2-3 scotch/day  . Drug use: No     Allergies  Allergen Reactions  . No Known Allergies     Current Outpatient Medications  Medication Sig Dispense Refill  . aspirin EC 81 MG tablet Take 81 mg by mouth daily.    . bisacodyl (DULCOLAX) 5 MG EC tablet Take 5 mg by mouth daily as needed for moderate constipation.    Marland Kitchen esomeprazole (NEXIUM)  20 MG packet Take 20 mg by mouth daily before breakfast.    . loratadine (CLARITIN) 10 MG tablet Take 10 mg by mouth daily as needed.    Marland Kitchen olmesartan (BENICAR) 20 MG tablet Take 20 mg by mouth daily.     No current facility-administered medications for this visit.     OBJECTIVE: Vitals:   11/01/18 1053  BP: 125/85  Pulse: 97  Temp: (!) 97.3 F (36.3 C)     Body mass index is 31.73 kg/m.    ECOG FS:0 - Asymptomatic  General: Well-developed, well-nourished, no acute distress. Eyes: Pink conjunctiva, anicteric sclera. HEENT: Normocephalic, moist mucous membranes. Lungs: Clear to auscultation bilaterally. Heart: Regular rate and rhythm. No rubs, murmurs, or gallops. Abdomen: Soft, nontender, nondistended. No organomegaly noted, normoactive bowel sounds.  Colostomy noted. Musculoskeletal: No edema, cyanosis, or clubbing. Neuro: Alert, answering all questions appropriately. Cranial nerves grossly intact. Skin: No rashes or petechiae noted. Psych: Normal affect.  LAB RESULTS:  Lab Results  Component Value Date   NA 133 (L) 05/03/2018   K 4.2 05/03/2018   CL 102 05/03/2018   CO2 22 05/03/2018   GLUCOSE 108 (H) 05/03/2018   BUN 13 05/03/2018   CREATININE 1.30 (H) 10/30/2018   CALCIUM 9.3 05/03/2018   PROT 7.1 05/03/2018   ALBUMIN 3.9 05/03/2018   AST 41 05/03/2018   ALT 25 05/03/2018   ALKPHOS 62 05/03/2018   BILITOT 0.8 05/03/2018   GFRNONAA >60 05/03/2018   GFRAA >60 05/03/2018    Lab Results  Component Value Date   WBC 6.8 10/30/2018   NEUTROABS 5.1 10/30/2018   HGB 16.0 10/30/2018   HCT 45.4 10/30/2018   MCV 100.9 (H) 10/30/2018   PLT 160 10/30/2018    STUDIES: Ct Chest W Contrast  Result Date: 10/30/2018 CLINICAL DATA:  5 mm right upper lobe pulmonary nodule on prior study. EXAM: CT CHEST WITH CONTRAST TECHNIQUE: Multidetector CT imaging of the chest was performed during intravenous contrast administration. CONTRAST:  51m ISOVUE-300 IOPAMIDOL (ISOVUE-300)  INJECTION 61% COMPARISON:  05/03/2018 FINDINGS: Cardiovascular: The heart size is normal. No substantial pericardial effusion. Coronary artery calcification is evident. Atherosclerotic calcification is noted in the wall of the thoracic aorta. Potential flow limiting stenosis proximal left subclavian artery (40/2) Mediastinum/Nodes: No mediastinal lymphadenopathy. There is no hilar lymphadenopathy. The esophagus has normal imaging features. There is no axillary lymphadenopathy. Lungs/Pleura: The central tracheobronchial airways are patent. Centrilobular and paraseptal emphysema again noted. 5 mm left upper lobe pulmonary nodule identified on the prior study is stable, measuring 5 mm today (36/3). Scattered areas of architectural distortion evident. Staple line noted left lower lobe. No focal airspace consolidation. No pleural effusion. Upper Abdomen: Unremarkable. Musculoskeletal: No worrisome lytic or sclerotic osseous abnormality. IMPRESSION: 1.  Stable 5 mm right upper lobe pulmonary nodule in the 6 month interval since prior study. Continued attention on follow-up recommended. 2. Similar appearance of the patchy ground-glass attenuation in the lungs bilaterally. As before, underlying interstitial lung disease is a consideration. 3.  Aortic Atherosclerois (ICD10-170.0) 4.  Emphysema. ([id number]-M07.9) Electronically Signed   By: Misty Stanley M.D.   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   PLAN:    1. 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 K-ras mutation positive:  Patient had completed his adjuvant chemotherapy on December 09, 2014.  CT scan results from May 03, 2018 reviewed independently with no obvious evidence of progressive or recurrent disease.  CEA continues to be within normal limits at 2.3.  No intervention is needed at this time.  Return to clinic in 6 months with repeat imaging and further  evaluation.  If everything remains stable, patient can be transitioned to yearly imaging.    2. Peripheral neuropathy: Chronic and unchanged.  Patient no longer takes any medications that were recommended or prescribed.   3. Rectal Pain/Pressure: Patient does not complain of this today.  Likely secondary to surgery. 4. Elevated MCV: Chronic and unchanged. Possibly secondary to history of chemotherapy. 5.  Pulmonary nodule: CT scan results of the chest from October 30, 2018 reviewed independently and reported as above with a stable 5 mm right upper lobe pulmonary nodule.  Repeat CT scan in 6 months as above.  Patient expressed understanding and was in agreement with this plan. He also understands that He can call clinic at any time with any questions, concerns, or complaints.    Lloyd Huger, MD   11/03/2018 7:43 AM

## 2018-10-30 ENCOUNTER — Inpatient Hospital Stay: Payer: Medicare HMO | Attending: Oncology

## 2018-10-30 ENCOUNTER — Ambulatory Visit: Admission: RE | Admit: 2018-10-30 | Payer: Medicare HMO | Source: Ambulatory Visit

## 2018-10-30 ENCOUNTER — Ambulatory Visit
Admission: RE | Admit: 2018-10-30 | Discharge: 2018-10-30 | Disposition: A | Discharge status: Home / Self Care | Payer: Medicare HMO | Source: Ambulatory Visit | Attending: Oncology | Admitting: Oncology

## 2018-10-30 DIAGNOSIS — I10 Essential (primary) hypertension: Secondary | ICD-10-CM | POA: Insufficient documentation

## 2018-10-30 DIAGNOSIS — C20 Malignant neoplasm of rectum: Secondary | ICD-10-CM | POA: Insufficient documentation

## 2018-10-30 DIAGNOSIS — Z7982 Long term (current) use of aspirin: Secondary | ICD-10-CM | POA: Diagnosis not present

## 2018-10-30 DIAGNOSIS — G629 Polyneuropathy, unspecified: Secondary | ICD-10-CM | POA: Diagnosis not present

## 2018-10-30 DIAGNOSIS — R918 Other nonspecific abnormal finding of lung field: Secondary | ICD-10-CM | POA: Diagnosis not present

## 2018-10-30 DIAGNOSIS — Z803 Family history of malignant neoplasm of breast: Secondary | ICD-10-CM | POA: Insufficient documentation

## 2018-10-30 DIAGNOSIS — F17210 Nicotine dependence, cigarettes, uncomplicated: Secondary | ICD-10-CM | POA: Insufficient documentation

## 2018-10-30 DIAGNOSIS — Z800 Family history of malignant neoplasm of digestive organs: Secondary | ICD-10-CM | POA: Insufficient documentation

## 2018-10-30 DIAGNOSIS — Z79899 Other long term (current) drug therapy: Secondary | ICD-10-CM | POA: Insufficient documentation

## 2018-10-30 DIAGNOSIS — C7802 Secondary malignant neoplasm of left lung: Secondary | ICD-10-CM | POA: Insufficient documentation

## 2018-10-30 LAB — CBC WITH DIFFERENTIAL/PLATELET
ABS IMMATURE GRANULOCYTES: 0.02 10*3/uL (ref 0.00–0.07)
Basophils Absolute: 0.1 10*3/uL (ref 0.0–0.1)
Basophils Relative: 1 %
Eosinophils Absolute: 0.1 10*3/uL (ref 0.0–0.5)
Eosinophils Relative: 2 %
HCT: 45.4 % (ref 39.0–52.0)
HEMOGLOBIN: 16 g/dL (ref 13.0–17.0)
IMMATURE GRANULOCYTES: 0 %
LYMPHS ABS: 0.8 10*3/uL (ref 0.7–4.0)
LYMPHS PCT: 11 %
MCH: 35.6 pg — AB (ref 26.0–34.0)
MCHC: 35.2 g/dL (ref 30.0–36.0)
MCV: 100.9 fL — AB (ref 80.0–100.0)
MONO ABS: 0.7 10*3/uL (ref 0.1–1.0)
MONOS PCT: 11 %
NEUTROS PCT: 75 %
Neutro Abs: 5.1 10*3/uL (ref 1.7–7.7)
Platelets: 160 10*3/uL (ref 150–400)
RBC: 4.5 MIL/uL (ref 4.22–5.81)
RDW: 14.4 % (ref 11.5–15.5)
WBC: 6.8 10*3/uL (ref 4.0–10.5)
nRBC: 0 % (ref 0.0–0.2)

## 2018-10-30 LAB — POCT I-STAT CREATININE: Creatinine, Ser: 1.3 mg/dL — ABNORMAL HIGH (ref 0.61–1.24)

## 2018-10-30 MED ORDER — IOPAMIDOL (ISOVUE-300) INJECTION 61%
75.0000 mL | Freq: Once | INTRAVENOUS | Status: AC | PRN
  Administered 2018-10-30: 75 mL via INTRAVENOUS

## 2018-10-31 LAB — CEA: CEA1: 2.3 ng/mL (ref 0.0–4.7)

## 2018-11-01 ENCOUNTER — Other Ambulatory Visit: Payer: Self-pay

## 2018-11-01 ENCOUNTER — Inpatient Hospital Stay (HOSPITAL_BASED_OUTPATIENT_CLINIC_OR_DEPARTMENT_OTHER): Payer: Medicare HMO | Admitting: Oncology

## 2018-11-01 VITALS — BP 125/85 | HR 97 | Temp 97.3°F | Wt 202.6 lb

## 2018-11-01 DIAGNOSIS — C20 Malignant neoplasm of rectum: Secondary | ICD-10-CM

## 2018-11-01 DIAGNOSIS — C7802 Secondary malignant neoplasm of left lung: Secondary | ICD-10-CM

## 2018-11-01 DIAGNOSIS — I10 Essential (primary) hypertension: Secondary | ICD-10-CM

## 2018-11-01 DIAGNOSIS — Z803 Family history of malignant neoplasm of breast: Secondary | ICD-10-CM

## 2018-11-01 DIAGNOSIS — Z800 Family history of malignant neoplasm of digestive organs: Secondary | ICD-10-CM

## 2018-11-01 DIAGNOSIS — G629 Polyneuropathy, unspecified: Secondary | ICD-10-CM | POA: Diagnosis not present

## 2018-11-01 DIAGNOSIS — F17210 Nicotine dependence, cigarettes, uncomplicated: Secondary | ICD-10-CM

## 2018-11-01 DIAGNOSIS — Z79899 Other long term (current) drug therapy: Secondary | ICD-10-CM

## 2018-11-01 DIAGNOSIS — Z7982 Long term (current) use of aspirin: Secondary | ICD-10-CM

## 2018-11-01 DIAGNOSIS — R918 Other nonspecific abnormal finding of lung field: Secondary | ICD-10-CM

## 2018-11-01 NOTE — Progress Notes (Signed)
Patient is here today for rectal cancer. Patient stated that he fell 2 weeks ago due to his neuropathy on his feet. Patient stated that he has Gabapentin at home but he does not to take since he does not like the side effects.

## 2019-05-03 ENCOUNTER — Inpatient Hospital Stay: Payer: Medicare HMO | Attending: Oncology

## 2019-05-03 ENCOUNTER — Other Ambulatory Visit: Payer: Self-pay

## 2019-05-03 ENCOUNTER — Ambulatory Visit
Admission: RE | Admit: 2019-05-03 | Discharge: 2019-05-03 | Disposition: A | Discharge status: Home / Self Care | Payer: Medicare HMO | Source: Ambulatory Visit | Attending: Oncology | Admitting: Oncology

## 2019-05-03 DIAGNOSIS — C20 Malignant neoplasm of rectum: Secondary | ICD-10-CM | POA: Diagnosis present

## 2019-05-03 DIAGNOSIS — C7802 Secondary malignant neoplasm of left lung: Secondary | ICD-10-CM | POA: Insufficient documentation

## 2019-05-03 LAB — COMPREHENSIVE METABOLIC PANEL
ALT: 25 U/L (ref 0–44)
AST: 53 U/L — ABNORMAL HIGH (ref 15–41)
Albumin: 3.8 g/dL (ref 3.5–5.0)
Alkaline Phosphatase: 63 U/L (ref 38–126)
Anion gap: 12 (ref 5–15)
BUN: 9 mg/dL (ref 8–23)
CO2: 19 mmol/L — ABNORMAL LOW (ref 22–32)
Calcium: 8.9 mg/dL (ref 8.9–10.3)
Chloride: 99 mmol/L (ref 98–111)
Creatinine, Ser: 1.2 mg/dL (ref 0.61–1.24)
GFR calc Af Amer: >60 mL/min (ref >60)
GFR calc non Af Amer: >60 mL/min (ref >60)
Glucose, Bld: 118 mg/dL — ABNORMAL HIGH (ref 70–99)
Potassium: 4.2 mmol/L (ref 3.5–5.1)
Sodium: 130 mmol/L — ABNORMAL LOW (ref 135–145)
Total Bilirubin: 1.1 mg/dL (ref 0.3–1.2)
Total Protein: 7.3 g/dL (ref 6.5–8.1)

## 2019-05-03 LAB — CBC WITH DIFFERENTIAL/PLATELET
Abs Immature Granulocytes: 0.03 10*3/uL (ref 0.00–0.07)
Basophils Absolute: 0.1 10*3/uL (ref 0.0–0.1)
Basophils Relative: 1 %
Eosinophils Absolute: 0.2 10*3/uL (ref 0.0–0.5)
Eosinophils Relative: 2 %
HCT: 42.3 % (ref 39.0–52.0)
Hemoglobin: 15.1 g/dL (ref 13.0–17.0)
Immature Granulocytes: 0 %
Lymphocytes Relative: 14 %
Lymphs Abs: 1.1 10*3/uL (ref 0.7–4.0)
MCH: 36.5 pg — ABNORMAL HIGH (ref 26.0–34.0)
MCHC: 35.7 g/dL (ref 30.0–36.0)
MCV: 102.2 fL — ABNORMAL HIGH (ref 80.0–100.0)
Monocytes Absolute: 0.8 10*3/uL (ref 0.1–1.0)
Monocytes Relative: 10 %
Neutro Abs: 5.4 10*3/uL (ref 1.7–7.7)
Neutrophils Relative %: 73 %
Platelets: 172 10*3/uL (ref 150–400)
RBC: 4.14 MIL/uL — ABNORMAL LOW (ref 4.22–5.81)
RDW: 13.9 % (ref 11.5–15.5)
WBC: 7.5 10*3/uL (ref 4.0–10.5)
nRBC: 0 % (ref 0.0–0.2)

## 2019-05-03 MED ORDER — IOHEXOL 300 MG/ML  SOLN
85.0000 mL | Freq: Once | INTRAMUSCULAR | Status: AC | PRN
  Administered 2019-05-03: 85 mL via INTRAVENOUS

## 2019-05-04 LAB — CEA: CEA: 2.2 ng/mL (ref 0.0–4.7)

## 2019-05-04 NOTE — Progress Notes (Signed)
[place]  Telephone:(336) [phone] Fax:(336) [phone]  ID: Stephen [name] OB: 07/05/1946  MR#: [id number]  KPT#:[id number]  Patient Care Team: [name], MD as PCP - General (Family Medicine) Christene Lye, MD as Consulting Physician (General Surgery) [name], MD as Referring Physician (Family Medicine)  I connected with Stephen [name] on 05/11/19 at 11:00 AM EDT by telephone visit and verified that I am speaking with the correct person using two identifiers.   I discussed the limitations, risks, security and privacy concerns of performing an evaluation and management service by telemedicine and the availability of in-person appointments. I also discussed with the patient that there may be a patient responsible charge related to this service. The patient expressed understanding and agreed to proceed.   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 K-ras mutation positive.  INTERVAL HISTORY: Patient agreed to evaluation and discussion of his imaging results by telephone.  He continues to have a peripheral neuropathy that is chronic and unchanged.  He otherwise feels well and is asymptomatic.  He has no other neurologic complaints.  He denies any recent fevers or illnesses.  He has a good appetite and denies weight loss.  He denies any chest pain, shortness of breath, cough, or hemoptysis.  He denies any nausea, vomiting, constipation, or diarrhea. He has no melena or hematochezia. 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  Patient feels at his baseline offers no specific complaints today.  REVIEW OF SYSTEMS:   Review of Systems  Constitutional: Negative.  Negative for fever, malaise/fatigue and weight loss.  Respiratory: Negative.  Negative for cough, hemoptysis and shortness of  breath.   Cardiovascular: Negative.  Negative for chest pain and leg swelling.  Gastrointestinal: Negative for abdominal pain, blood in stool, melena, nausea and vomiting.  Genitourinary: Negative.  Negative for dysuria.  Musculoskeletal: Negative.  Negative for back pain.  Skin: Negative.  Negative for rash.  Neurological: Positive for sensory change. Negative for focal weakness and weakness.  Psychiatric/Behavioral: Negative.  The patient is not nervous/anxious.     As per HPI. Otherwise, a complete review of systems is negative.  PAST MEDICAL HISTORY: Past Medical History:  Diagnosis Date  . Allergic rhinitis   . Blood clot in vein 2015   left leg   . History of anemia   . History of chicken pox   . HTN (hypertension)   . Neuropathy    arms and legs - S/P Cancer tx  . Rectal cancer metastasized to lung [facility]) 09-2013   surgery and chemo  . Smoker     PAST SURGICAL HISTORY: Past Surgical History:  Procedure Laterality Date  . COLON RESECTION  2015  . COLONOSCOPY  2015  . EUS N/A 11/15/2013   Procedure: LOWER ENDOSCOPIC ULTRASOUND (EUS);  Surgeon: Milus Banister, MD;  Location: [facility];  Service: Endoscopy;  Laterality: N/A;  . LUNG BIOPSY    . LUNG LOBECTOMY Left   . lung removed  2015   left lower   . PLANTAR FASCIA RELEASE Right 09/20/2017   Procedure: PLANTAR FASCIA [id number], excision plantar fibroma right foot;  Surgeon: Samara Deist, DPM;  Location: [place];  Service: Podiatry;  Laterality: Right;  . TONSILLECTOMY AND ADENOIDECTOMY  Childhood  . VARICOSE VEIN SURGERY  2010    FAMILY HISTORY Family History  Problem Relation  Age of Onset  . Hypertension Mother   . Coronary artery disease Mother        angina  . Cancer Mother        breast  . Cancer Father 77       esophageal  . COPD Brother        emphysema  . Cancer Paternal Grandmother        colon  . Diabetes Neg Hx        ADVANCED DIRECTIVES:    HEALTH MAINTENANCE: Social  History   Tobacco Use  . Smoking status: Current Every Day Smoker    Packs/day: 0.50    Years: 15.00    Pack years: 7.50    Types: Cigarettes  . Smokeless tobacco: Never Used  Substance Use Topics  . Alcohol use: Yes    Alcohol/week: 18.0 standard drinks    Types: 18 Shots of liquor per week    Comment: Regular 2-3 scotch/day  . Drug use: No     Allergies  Allergen Reactions  . No Known Allergies     Current Outpatient Medications  Medication Sig Dispense Refill  . aspirin EC 81 MG tablet Take 81 mg by mouth daily.    . bisacodyl (DULCOLAX) 5 MG EC tablet Take 5 mg by mouth daily as needed for moderate constipation.    Marland Kitchen esomeprazole (NEXIUM) 20 MG packet Take 20 mg by mouth daily before breakfast.    . folic acid (FOLVITE) 1 MG tablet Take 1 tablet by mouth 1 day or 1 dose.    . loratadine (CLARITIN) 10 MG tablet Take 10 mg by mouth daily as needed.    Marland Kitchen olmesartan (BENICAR) 20 MG tablet Take 20 mg by mouth daily.    Marland Kitchen pyridOXINE (B-6) 50 MG tablet Take 1 tablet by mouth.     No current facility-administered medications for this visit.     OBJECTIVE: There were no vitals filed for this visit.   Body mass index is 30.07 kg/m.    ECOG FS:0 - Asymptomatic  LAB RESULTS:  Lab Results  Component Value Date   NA 130 (L) 05/03/2019   K 4.2 05/03/2019   CL 99 05/03/2019   CO2 19 (L) 05/03/2019   GLUCOSE 118 (H) 05/03/2019   BUN 9 05/03/2019   CREATININE 1.20 05/03/2019   CALCIUM 8.9 05/03/2019   PROT 7.3 05/03/2019   ALBUMIN 3.8 05/03/2019   AST 53 (H) 05/03/2019   ALT 25 05/03/2019   ALKPHOS 63 05/03/2019   BILITOT 1.1 05/03/2019   GFRNONAA >60 05/03/2019   GFRAA >60 05/03/2019    Lab Results  Component Value Date   WBC 7.5 05/03/2019   NEUTROABS 5.4 05/03/2019   HGB 15.1 05/03/2019   HCT 42.3 05/03/2019   MCV 102.2 (H) 05/03/2019   PLT 172 05/03/2019    STUDIES: Ct Chest W Contrast  Result Date: 05/03/2019 CLINICAL DATA:  Stage IV rectal cancer  with lung metastasis. EXAM: CT CHEST, ABDOMEN, AND PELVIS WITH CONTRAST TECHNIQUE: Multidetector CT imaging of the chest, abdomen and pelvis was performed following the standard protocol during bolus administration of intravenous contrast. CONTRAST:  75m OMNIPAQUE IOHEXOL 300 MG/ML  SOLN COMPARISON:  05/03/2018 chest abdomen and pelvic CT. Chest CT 10/30/2018. FINDINGS: CT CHEST FINDINGS Cardiovascular: Advanced aortic and branch vessel atherosclerosis. Significant stenosis of the left proximal subclavian artery, similar on 12/12. Tortuous thoracic aorta. Normal heart size, without pericardial effusion. Multivessel coronary artery atherosclerosis. No central pulmonary embolism, on this non-dedicated  study. Mediastinum/Nodes: No supraclavicular adenopathy. No mediastinal or hilar adenopathy. Tiny hiatal hernia. Lungs/Pleura: No pleural fluid. Centrilobular and paraseptal emphysema. A vague right apical pulmonary nodule measures 7 mm on image 26/3 versus 6 mm on the prior exam (when remeasured). Left lower lobe surgical sutures. Areas of peripheral predominant ground-glass opacity are not significantly changed. Musculoskeletal: Remote left rib trauma. CT ABDOMEN PELVIS FINDINGS Hepatobiliary: Hepatic steatosis, without focal liver lesion. Normal gallbladder, without biliary ductal dilatation. Pancreas: Normal, without mass or ductal dilatation. Spleen: Normal in size, without focal abnormality. Adrenals/Urinary Tract: Left greater than right adrenal thickening and nodularity are unchanged. Exophytic interpolar 2.4 cm right renal low-density lesion is favored to represent a minimally complex cyst and is unchanged in size. Too small to characterize upper pole right renal lesion. Normal urinary bladder. Stomach/Bowel: Normal remainder of the stomach. Descending colostomy, without acute complication. Normal terminal ileum and appendix. Normal small bowel. Similar appearance of presacral soft tissue thickening which is  likely treatment related. No locally recurrent disease. Vascular/Lymphatic: Aortic and branch vessel atherosclerosis. No abdominopelvic adenopathy. Reproductive: Normal prostate. Other: No significant free fluid. No evidence of omental or peritoneal disease. Bilateral fat containing inguinal hernias. Musculoskeletal: Possible L1 superior endplate compression deformity, mild and similar. IMPRESSION: 1. Right apical pulmonary nodule may have enlarged minimally compared to 10/30/2018. Consider chest CT follow-up at 3-6 months. 2. Otherwise, no acute process or evidence of metastatic disease within the chest, abdomen, or pelvis. 3. Similar appearance of patchy areas of ground-glass, suggesting interstitial lung disease. Possibly nonspecific interstitial pneumonitis. 4. Coronary artery atherosclerosis. Aortic Atherosclerosis (ICD10-I70.0). 5. Hepatic steatosis. Electronically Signed   By: Abigail Miyamoto M.D.   On: 05/03/2019 14:02   Ct Abdomen Pelvis W Contrast  Result Date: 05/03/2019 CLINICAL DATA:  Stage IV rectal cancer with lung metastasis. EXAM: CT CHEST, ABDOMEN, AND PELVIS WITH CONTRAST TECHNIQUE: Multidetector CT imaging of the chest, abdomen and pelvis was performed following the standard protocol during bolus administration of intravenous contrast. CONTRAST:  35m OMNIPAQUE IOHEXOL 300 MG/ML  SOLN COMPARISON:  05/03/2018 chest abdomen and pelvic CT. Chest CT 10/30/2018. FINDINGS: CT CHEST FINDINGS Cardiovascular: Advanced aortic and branch vessel atherosclerosis. Significant stenosis of the left proximal subclavian artery, similar on 12/12. Tortuous thoracic aorta. Normal heart size, without pericardial effusion. Multivessel coronary artery atherosclerosis. No central pulmonary embolism, on this non-dedicated study. Mediastinum/Nodes: No supraclavicular adenopathy. No mediastinal or hilar adenopathy. Tiny hiatal hernia. Lungs/Pleura: No pleural fluid. Centrilobular and paraseptal emphysema. A vague right  apical pulmonary nodule measures 7 mm on image 26/3 versus 6 mm on the prior exam (when remeasured). Left lower lobe surgical sutures. Areas of peripheral predominant ground-glass opacity are not significantly changed. Musculoskeletal: Remote left rib trauma. CT ABDOMEN PELVIS FINDINGS Hepatobiliary: Hepatic steatosis, without focal liver lesion. Normal gallbladder, without biliary ductal dilatation. Pancreas: Normal, without mass or ductal dilatation. Spleen: Normal in size, without focal abnormality. Adrenals/Urinary Tract: Left greater than right adrenal thickening and nodularity are unchanged. Exophytic interpolar 2.4 cm right renal low-density lesion is favored to represent a minimally complex cyst and is unchanged in size. Too small to characterize upper pole right renal lesion. Normal urinary bladder. Stomach/Bowel: Normal remainder of the stomach. Descending colostomy, without acute complication. Normal terminal ileum and appendix. Normal small bowel. Similar appearance of presacral soft tissue thickening which is likely treatment related. No locally recurrent disease. Vascular/Lymphatic: Aortic and branch vessel atherosclerosis. No abdominopelvic adenopathy. Reproductive: Normal prostate. Other: No significant free fluid. No evidence of omental or peritoneal disease.  Bilateral fat containing inguinal hernias. Musculoskeletal: Possible L1 superior endplate compression deformity, mild and similar. IMPRESSION: 1. Right apical pulmonary nodule may have enlarged minimally compared to 10/30/2018. Consider chest CT follow-up at 3-6 months. 2. Otherwise, no acute process or evidence of metastatic disease within the chest, abdomen, or pelvis. 3. Similar appearance of patchy areas of ground-glass, suggesting interstitial lung disease. Possibly nonspecific interstitial pneumonitis. 4. Coronary artery atherosclerosis. Aortic Atherosclerosis (ICD10-I70.0). 5. Hepatic steatosis. Electronically Signed   By: Abigail Miyamoto  M.D.   On: 05/03/2019 14:02    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   PLAN:    1. 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 K-ras mutation positive:  Patient had completed his adjuvant chemotherapy on December 09, 2014.  CT scan results from May 03, 2019 reviewed independently and reported as above with no obvious evidence of recurrent or progressive disease. CEA continues to be within normal limits at 2.2.  No intervention is needed at this time.  Return to clinic in 6 months for laboratory work only and then in 1 year for the laboratory work, repeat imaging, and further evaluation. 2. Peripheral neuropathy: Chronic and unchanged.  Patient no longer takes any medications that were recommended or prescribed.   3. Rectal Pain/Pressure: Patient does not complain of this today.  Likely secondary to surgery. 4. Elevated MCV: Chronic and unchanged. Possibly secondary to history of chemotherapy. 5.  Pulmonary nodule: CT scan results from May 03, 2019 revealed persistent right apical pulmonary nodule that has increased 1 mm in size (6 mm to 7 mm) since December 2019.  Continue to monitor.  Repeat CT scan in 1 year as above.   I provided 15 minutes of non face-to-face telephone visit time during this encounter, and > 50% was spent counseling as documented under my assessment & plan.  Patient expressed understanding and was in agreement with this plan. He also understands that He can call clinic at any time with any questions, concerns, or complaints.    Lloyd Huger, MD   05/11/2019 7:00 AM

## 2019-05-09 ENCOUNTER — Inpatient Hospital Stay (HOSPITAL_BASED_OUTPATIENT_CLINIC_OR_DEPARTMENT_OTHER): Payer: Medicare HMO | Admitting: Oncology

## 2019-05-09 ENCOUNTER — Encounter: Payer: Self-pay | Admitting: Oncology

## 2019-05-09 ENCOUNTER — Other Ambulatory Visit: Payer: Self-pay

## 2019-05-09 VITALS — Wt 192.0 lb

## 2019-05-09 DIAGNOSIS — C20 Malignant neoplasm of rectum: Secondary | ICD-10-CM

## 2019-05-09 NOTE — Progress Notes (Signed)
Patient stated that he continues to have neuropathy real bad on his bilateral feet and poor appetite. Patient had lost 10 lbs in the last 6 months. Patient denied fever, chills, nausea, vomiting, constipation or diarrhea.

## 2019-11-11 ENCOUNTER — Other Ambulatory Visit: Payer: Self-pay | Admitting: Emergency Medicine

## 2019-11-11 ENCOUNTER — Other Ambulatory Visit: Payer: Self-pay

## 2019-11-11 DIAGNOSIS — C20 Malignant neoplasm of rectum: Secondary | ICD-10-CM

## 2019-11-12 ENCOUNTER — Inpatient Hospital Stay: Payer: Medicare HMO | Attending: Oncology

## 2019-11-12 ENCOUNTER — Other Ambulatory Visit: Payer: Self-pay

## 2019-11-12 DIAGNOSIS — C20 Malignant neoplasm of rectum: Secondary | ICD-10-CM | POA: Diagnosis present

## 2019-11-12 LAB — CBC WITH DIFFERENTIAL/PLATELET
Abs Immature Granulocytes: 0.05 10*3/uL (ref 0.00–0.07)
Basophils Absolute: 0.1 10*3/uL (ref 0.0–0.1)
Basophils Relative: 1 %
Eosinophils Absolute: 0.3 10*3/uL (ref 0.0–0.5)
Eosinophils Relative: 3 %
HCT: 44.1 % (ref 39.0–52.0)
Hemoglobin: 14.9 g/dL (ref 13.0–17.0)
Immature Granulocytes: 1 %
Lymphocytes Relative: 17 %
Lymphs Abs: 1.6 10*3/uL (ref 0.7–4.0)
MCH: 36 pg — ABNORMAL HIGH (ref 26.0–34.0)
MCHC: 33.8 g/dL (ref 30.0–36.0)
MCV: 106.5 fL — ABNORMAL HIGH (ref 80.0–100.0)
Monocytes Absolute: 0.9 10*3/uL (ref 0.1–1.0)
Monocytes Relative: 10 %
Neutro Abs: 6.4 10*3/uL (ref 1.7–7.7)
Neutrophils Relative %: 68 %
Platelets: 225 10*3/uL (ref 150–400)
RBC: 4.14 MIL/uL — ABNORMAL LOW (ref 4.22–5.81)
RDW: 15.1 % (ref 11.5–15.5)
WBC: 9.4 10*3/uL (ref 4.0–10.5)
nRBC: 0 % (ref 0.0–0.2)

## 2019-11-12 LAB — COMPREHENSIVE METABOLIC PANEL
ALT: 19 U/L (ref 0–44)
AST: 40 U/L (ref 15–41)
Albumin: 3.4 g/dL — ABNORMAL LOW (ref 3.5–5.0)
Alkaline Phosphatase: 80 U/L (ref 38–126)
Anion gap: 11 (ref 5–15)
BUN: 8 mg/dL (ref 8–23)
CO2: 27 mmol/L (ref 22–32)
Calcium: 8.9 mg/dL (ref 8.9–10.3)
Chloride: 99 mmol/L (ref 98–111)
Creatinine, Ser: 1.24 mg/dL (ref 0.61–1.24)
GFR calc Af Amer: >60 mL/min (ref >60)
GFR calc non Af Amer: 58 mL/min — ABNORMAL LOW (ref >60)
Glucose, Bld: 102 mg/dL — ABNORMAL HIGH (ref 70–99)
Potassium: 3.6 mmol/L (ref 3.5–5.1)
Sodium: 137 mmol/L (ref 135–145)
Total Bilirubin: 1.3 mg/dL — ABNORMAL HIGH (ref 0.3–1.2)
Total Protein: 7.1 g/dL (ref 6.5–8.1)

## 2019-11-13 LAB — CEA: CEA: 2.7 ng/mL (ref 0.0–4.7)

## 2019-11-14 ENCOUNTER — Other Ambulatory Visit: Payer: Self-pay

## 2019-11-14 ENCOUNTER — Emergency Department
Admission: EM | Admit: 2019-11-14 | Discharge: 2019-11-15 | Disposition: A | Discharge status: Home / Self Care | Payer: Medicare HMO | Attending: Emergency Medicine | Admitting: Emergency Medicine

## 2019-11-14 ENCOUNTER — Emergency Department: Payer: Medicare HMO

## 2019-11-14 DIAGNOSIS — Z7982 Long term (current) use of aspirin: Secondary | ICD-10-CM | POA: Insufficient documentation

## 2019-11-14 DIAGNOSIS — Y92018 Other place in single-family (private) house as the place of occurrence of the external cause: Secondary | ICD-10-CM | POA: Insufficient documentation

## 2019-11-14 DIAGNOSIS — Y9301 Activity, walking, marching and hiking: Secondary | ICD-10-CM | POA: Insufficient documentation

## 2019-11-14 DIAGNOSIS — I10 Essential (primary) hypertension: Secondary | ICD-10-CM | POA: Diagnosis not present

## 2019-11-14 DIAGNOSIS — S0990XA Unspecified injury of head, initial encounter: Secondary | ICD-10-CM | POA: Diagnosis present

## 2019-11-14 DIAGNOSIS — C20 Malignant neoplasm of rectum: Secondary | ICD-10-CM | POA: Insufficient documentation

## 2019-11-14 DIAGNOSIS — S0081XA Abrasion of other part of head, initial encounter: Secondary | ICD-10-CM | POA: Diagnosis not present

## 2019-11-14 DIAGNOSIS — C7800 Secondary malignant neoplasm of unspecified lung: Secondary | ICD-10-CM | POA: Insufficient documentation

## 2019-11-14 DIAGNOSIS — W108XXA Fall (on) (from) other stairs and steps, initial encounter: Secondary | ICD-10-CM | POA: Insufficient documentation

## 2019-11-14 DIAGNOSIS — Y999 Unspecified external cause status: Secondary | ICD-10-CM | POA: Insufficient documentation

## 2019-11-14 DIAGNOSIS — W19XXXA Unspecified fall, initial encounter: Secondary | ICD-10-CM

## 2019-11-14 DIAGNOSIS — Z79899 Other long term (current) drug therapy: Secondary | ICD-10-CM | POA: Insufficient documentation

## 2019-11-14 DIAGNOSIS — F17210 Nicotine dependence, cigarettes, uncomplicated: Secondary | ICD-10-CM | POA: Insufficient documentation

## 2019-11-14 NOTE — Discharge Instructions (Addendum)

## 2019-11-14 NOTE — ED Triage Notes (Signed)
Pt arrives to ED from home via [facility] with c/c of dizziness and mechanical fall down while climbing steps. Pt fell forward and hit Left forehead.. Denies LoC.Marland Kitchen Pt vomited once after falling. EMS reports transport vitals of 114/64, p 90 NS, 20G placed in left forearm. Upon arrival, Pt A&*Ox4, NAD, no respiratory Sx evident.

## 2019-11-14 NOTE — ED Provider Notes (Signed)
[facility] Emergency Department Provider Note  ____________________________________________  Time seen: Approximately 11:35 PM  I have reviewed the triage vital signs and the nursing notes.   HISTORY  Chief Complaint Dizziness and Fall   HPI Stephen [name] is a 73 y.o. male with a history of metastatic rectal cancer, neuropathy, hypertension, anemia who presents for evaluation of a mechanical fall.  Patient reports having a New Year's celebration with his wife this evening and drank some alcohol.  He was on his way to bed going up the stairs when his legs gave out and he fell.  He reports that he has a history of falls due to neuropathy.  He reports that he felt he drank a little too much this evening and that contributed to his fall.  He hit his forehead onto the steps.  He is complaining of no pain at this time.  Had one episode of nonbloody nonbilious emesis after the fall.  No neck pain or back pain, no extremity pain, no chest pain or abdominal pain.   Past Medical History:  Diagnosis Date  . Allergic rhinitis   . Blood clot in vein 2015   left leg   . History of anemia   . History of chicken pox   . HTN (hypertension)   . Neuropathy    arms and legs - S/P Cancer tx  . Rectal cancer metastasized to lung [facility]) 09-2013   surgery and chemo  . Smoker     Patient Active Problem List   Diagnosis Date Noted  . Rectal cancer ([place]) 11/15/2013  . History of anemia   . HTN (hypertension)   . Smoker     Past Surgical History:  Procedure Laterality Date  . COLON RESECTION  2015  . COLONOSCOPY  2015  . EUS N/A 11/15/2013   Procedure: LOWER ENDOSCOPIC ULTRASOUND (EUS);  Surgeon: Milus Banister, MD;  Location: [facility];  Service: Endoscopy;  Laterality: N/A;  . LUNG BIOPSY    . LUNG LOBECTOMY Left   . lung removed  2015   left lower   . PLANTAR FASCIA RELEASE Right 09/20/2017   Procedure: PLANTAR FASCIA [id number], excision plantar fibroma right  foot;  Surgeon: Samara Deist, DPM;  Location: [place];  Service: Podiatry;  Laterality: Right;  . TONSILLECTOMY AND ADENOIDECTOMY  Childhood  . VARICOSE VEIN SURGERY  2010    Prior to Admission medications   Medication Sig Start Date End Date Taking? Authorizing Provider  aspirin EC 81 MG tablet Take 81 mg by mouth daily.    [provider]  bisacodyl (DULCOLAX) 5 MG EC tablet Take 5 mg by mouth daily as needed for moderate constipation.    [provider]  esomeprazole (NEXIUM) 20 MG packet Take 20 mg by mouth daily before breakfast.    [provider]  folic acid (FOLVITE) 1 MG tablet Take 1 tablet by mouth 1 day or 1 dose. 04/23/19   [provider]  loratadine (CLARITIN) 10 MG tablet Take 10 mg by mouth daily as needed.    [provider]  olmesartan (BENICAR) 20 MG tablet Take 20 mg by mouth daily.    [provider]  pyridOXINE (B-6) 50 MG tablet Take 1 tablet by mouth.    [provider]    Allergies No known allergies  Family History  Problem Relation Age of Onset  . Hypertension Mother   . Coronary artery disease Mother  angina  . Cancer Mother        breast  . Cancer Father 19       esophageal  . COPD Brother        emphysema  . Cancer Paternal Grandmother        colon  . Diabetes Neg Hx     Social History Social History   Tobacco Use  . Smoking status: Current Every Day Smoker    Packs/day: 0.50    Years: 15.00    Pack years: 7.50    Types: Cigarettes  . Smokeless tobacco: Never Used  Substance Use Topics  . Alcohol use: Yes    Alcohol/week: 18.0 standard drinks    Types: 18 Shots of liquor per week    Comment: Regular 2-3 scotch/day  . Drug use: No    Review of Systems  Constitutional: Negative for fever. Eyes: Negative for visual changes. ENT: Negative for facial injury or neck injury Cardiovascular: Negative for chest injury. Respiratory: Negative for shortness of  breath. Negative for chest wall injury. Gastrointestinal: Negative for abdominal pain or injury. Genitourinary: Negative for dysuria. Musculoskeletal: Negative for back injury, negative for arm or leg pain. Skin: Negative for laceration/abrasions. Neurological: + head injury.   ____________________________________________   PHYSICAL EXAM:  VITAL SIGNS: ED Triage Vitals  Enc Vitals Group     BP 11/14/19 2251 125/79     Pulse Rate 11/14/19 2251 77     Resp 11/14/19 2251 18     Temp 11/14/19 2251 97.7 F (36.5 C)     Temp src --      SpO2 11/14/19 2251 96 %     Weight 11/14/19 2249 192 lb (87.1 kg)     Height 11/14/19 2249 5\' 6"  (1.676 m)     Head Circumference --      Peak Flow --      Pain Score 11/14/19 2249 0     Pain Loc --      Pain Edu? --      Excl. in [place]? --     Full spinal precautions maintained throughout the trauma exam. Constitutional: Alert and oriented. No acute distress. Does not appear intoxicated. HEENT Head: Normocephalic, abrasion to the left forehead Face: No facial bony tenderness. Stable midface Ears: No hemotympanum bilaterally. No Battle sign Eyes: No eye injury. PERRL. No raccoon eyes Nose: Nontender. No epistaxis. No rhinorrhea Mouth/Throat: Mucous membranes are moist. No oropharyngeal blood. No dental injury. Airway patent without stridor. Normal voice. Neck: no C-collar. No midline c-spine tenderness.  Cardiovascular: Normal rate, regular rhythm. Normal and symmetric distal pulses are present in all extremities. Pulmonary/Chest: Chest wall is stable and nontender to palpation/compression. Normal respiratory effort. Breath sounds are normal. No crepitus.  Abdominal: Soft, nontender, non distended. Musculoskeletal: Nontender with normal full range of motion in all extremities. No deformities. No thoracic or lumbar midline spinal tenderness. Pelvis is stable. Skin: Skin is warm, dry and intact. No abrasions or contutions. Psychiatric: Speech and  behavior are appropriate. Neurological: Normal speech and language. Moves all extremities to command. No gross focal neurologic deficits are appreciated.  Glascow Coma Score: 4 - Opens eyes on own 6 - Follows simple motor commands 5 - Alert and oriented GCS: 15   ____________________________________________   LABS (all labs ordered are listed, but only abnormal results are displayed)  Labs Reviewed - No data to display ____________________________________________  EKG  ED ECG REPORT I, Rudene Re, the attending physician, personally viewed and interpreted this ECG.  Normal sinus rhythm, rate of 74, normal intervals, left axis deviation, inferior Q waves, no ST elevations or depressions. ____________________________________________  RADIOLOGY  I have personally reviewed the images performed during this visit and I agree with the Radiologist's read.   Interpretation by Radiologist:  CT Head Wo Contrast  Result Date: 11/14/2019 CLINICAL DATA:  Mechanical fall, hit left forehead EXAM: CT HEAD WITHOUT CONTRAST TECHNIQUE: Contiguous axial images were obtained from the base of the skull through the vertex without intravenous contrast. COMPARISON:  None. FINDINGS: Brain: No acute territorial infarction, hemorrhage or intracranial mass. Moderate atrophy. Mild periventricular white matter hypodensity, likely chronic small vessel ischemic change. Chronic appearing lacunar infarct right basal ganglia. Ventricles are nonenlarged Vascular: No hyperdense vessels.  Carotid vascular calcification Skull: Normal. Negative for fracture or focal lesion. Sinuses/Orbits: No acute finding. Other: None IMPRESSION: 1. No CT evidence for acute intracranial abnormality. 2. Atrophy and mild small vessel ischemic changes of the white matter. Electronically Signed   By: Donavan Foil M.D.   On: 11/14/2019 23:47      ____________________________________________   PROCEDURES  Procedure(s)  performed: None Procedures Critical Care performed:  None ____________________________________________   INITIAL IMPRESSION / ASSESSMENT AND PLAN / ED COURSE   73 y.o. male with a history of metastatic rectal cancer, neuropathy, hypertension, anemia who presents for evaluation of a mechanical fall.  Patient is extremely well-appearing in no distress with normal vitals, no signs or symptoms of basilar skull fracture.  He has an abrasion to the left forehead, no bony deformities, no CT no spine tenderness, full painless range of motion of all joints.  We will get an EKG and head CT.    _________________________ 11:55 PM on 11/14/2019 -----------------------------------------  EKG with no evidence of dysrhythmias or ischemia. Head CT negative for intracranial trauma. At this time patient is stable for discharge home with follow-up with PCP. Discussed my standard return precautions.    Please note:  Patient was evaluated in Emergency Department today for the symptoms described in the history of present illness. Patient was evaluated in the context of the global COVID-19 pandemic, which necessitated consideration that the patient might be at risk for infection with the SARS-CoV-2 virus that causes COVID-19. Institutional protocols and algorithms that pertain to the evaluation of patients at risk for COVID-19 are in a state of rapid change based on information released by regulatory bodies including the CDC and federal and state organizations. These policies and algorithms were followed during the patient's care in the ED.  Some ED evaluations and interventions may be delayed as a result of limited staffing during the pandemic.   As part of my medical decision making, I reviewed the following data within the [place] notes reviewed and incorporated, EKG interpreted , Old chart reviewed, Radiograph reviewed , Notes from prior ED visits and [place] Controlled Substance  Database   ____________________________________________   FINAL CLINICAL IMPRESSION(S) / ED DIAGNOSES   Final diagnoses:  Fall, initial encounter  Traumatic injury of head, initial encounter  Abrasion of forehead, initial encounter      NEW MEDICATIONS STARTED DURING THIS VISIT:  ED Discharge Orders    None       Note:  This document was prepared using Dragon voice recognition software and may include unintentional dictation errors.    Rudene Re, MD 11/14/19 [phone]

## 2020-02-13 ENCOUNTER — Ambulatory Visit: Payer: Medicare HMO | Attending: Internal Medicine

## 2020-02-13 ENCOUNTER — Other Ambulatory Visit: Payer: Self-pay

## 2020-02-13 DIAGNOSIS — Z23 Encounter for immunization: Secondary | ICD-10-CM

## 2020-02-13 NOTE — Progress Notes (Signed)
   Covid-19 Vaccination Clinic  Name:  Stephen [name]    MRN: [id number] DOB: Feb 13, 1946  02/13/2020  [name] was observed post Covid-19 immunization for 15 minutes without incident. He was provided with Vaccine Information Sheet and instruction to access the V-Safe system.   [name] was instructed to call 911 with any severe reactions post vaccine: Marland Kitchen Difficulty breathing  . Swelling of face and throat  . A fast heartbeat  . A bad rash all over body  . Dizziness and weakness   Immunizations Administered    Name Date Dose VIS Date Route   Pfizer COVID-19 Vaccine 02/13/2020 10:31 AM 0.3 mL 10/25/2019 Intramuscular   Manufacturer: [place]   Lot: [id number]   [place]: [id number]

## 2020-03-10 ENCOUNTER — Ambulatory Visit: Payer: Medicare HMO | Attending: Internal Medicine

## 2020-03-10 DIAGNOSIS — Z23 Encounter for immunization: Secondary | ICD-10-CM

## 2020-03-10 NOTE — Progress Notes (Signed)
   Covid-19 Vaccination Clinic  Name:  Stephen [name]    MRN: [id number] DOB: 02/07/1946  03/10/2020  Stephen [name] was observed post Covid-19 immunization for 15 minutes without incident. He was provided with Vaccine Information Sheet and instruction to access the V-Safe system.   Stephen [name] was instructed to call 911 with any severe reactions post vaccine: Marland Kitchen Difficulty breathing  . Swelling of face and throat  . A fast heartbeat  . A bad rash all over body  . Dizziness and weakness   Immunizations Administered    Name Date Dose VIS Date Route   Pfizer COVID-19 Vaccine 03/10/2020 10:18 AM 0.3 mL 01/08/2019 Intramuscular   Manufacturer: [employer]   Lot: [id number]   [place]: [id number]

## 2020-05-03 NOTE — Progress Notes (Deleted)
[facility]  [phone] [phone]  ID: Stephen [name] OB: 1946/03/03  MR#: [id number]  FTD#:[id number]  Patient Care Team: [name], MD as PCP - General (Family Medicine) Christene Lye, MD as Consulting Physician (General Surgery) Clemmie Krill [name], MD as Referring Physician (Family Medicine) Lloyd Huger, MD as Consulting Physician (Hematology and Oncology)  I connected with Stephen [name] on 05/03/20 at 10:45 AM EDT by telephone visit and verified that I am speaking with the correct person using two identifiers.   I discussed the limitations, risks, security and privacy concerns of performing an evaluation and management service by telemedicine and the availability of in-person appointments. I also discussed with the patient that there may be a patient responsible charge related to this service. The patient expressed understanding and agreed to proceed.   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 K-ras mutation positive.  INTERVAL HISTORY: Patient agreed to evaluation and discussion of his imaging results by telephone.  He continues to have a peripheral neuropathy that is chronic and unchanged.  He otherwise feels well and is asymptomatic.  He has no other neurologic complaints.  He denies any recent fevers or illnesses.  He has a good appetite and denies weight loss.  He denies any chest pain, shortness of breath, cough, or hemoptysis.  He denies any nausea, vomiting, constipation, or diarrhea. He has no melena or hematochezia. 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  Patient feels at his baseline offers no specific complaints today.  REVIEW OF SYSTEMS:   Review of Systems  Constitutional: Negative.  Negative for fever, malaise/fatigue and weight loss.    Respiratory: Negative.  Negative for cough, hemoptysis and shortness of breath.   Cardiovascular: Negative.  Negative for chest pain and leg swelling.  Gastrointestinal: Negative for abdominal pain, blood in stool, melena, nausea and vomiting.  Genitourinary: Negative.  Negative for dysuria.  Musculoskeletal: Negative.  Negative for back pain.  Skin: Negative.  Negative for rash.  Neurological: Positive for sensory change. Negative for focal weakness and weakness.  Psychiatric/Behavioral: Negative.  The patient is not nervous/anxious.     As per HPI. Otherwise, a complete review of systems is negative.  PAST MEDICAL HISTORY: Past Medical History:  Diagnosis Date  . Allergic rhinitis   . Blood clot in vein 2015   left leg   . History of anemia   . History of chicken pox   . HTN (hypertension)   . Neuropathy    arms and legs - S/P Cancer tx  . Rectal cancer metastasized to lung [facility]) 09-2013   surgery and chemo  . Smoker     PAST SURGICAL HISTORY: Past Surgical History:  Procedure Laterality Date  . COLON RESECTION  2015  . COLONOSCOPY  2015  . EUS N/A 11/15/2013   Procedure: LOWER ENDOSCOPIC ULTRASOUND (EUS);  Surgeon: Milus Banister, MD;  Location: [facility];  Service: Endoscopy;  Laterality: N/A;  . LUNG BIOPSY    . LUNG LOBECTOMY Left   . lung removed  2015   left lower   . PLANTAR FASCIA RELEASE Right 09/20/2017   Procedure: PLANTAR FASCIA [id number], excision plantar fibroma right foot;  Surgeon: Samara Deist, DPM;  Location: [place];  Service: Podiatry;  Laterality: Right;  . TONSILLECTOMY AND ADENOIDECTOMY  Childhood  . VARICOSE VEIN SURGERY  2010    FAMILY HISTORY Family History  Problem Relation Age of Onset  . Hypertension Mother   . Coronary artery disease Mother        angina  . Cancer Mother        breast  . Cancer Father 37       esophageal  . COPD Brother        emphysema  . Cancer Paternal Grandmother        colon  . Diabetes  Neg Hx        ADVANCED DIRECTIVES:    HEALTH MAINTENANCE: Social History   Tobacco Use  . Smoking status: Current Every Day Smoker    Packs/day: 0.50    Years: 15.00    Pack years: 7.50    Types: Cigarettes  . Smokeless tobacco: Never Used  Vaping Use  . Vaping Use: Never used  Substance Use Topics  . Alcohol use: Yes    Alcohol/week: 18.0 standard drinks    Types: 18 Shots of liquor per week    Comment: Regular 2-3 scotch/day  . Drug use: No     Allergies  Allergen Reactions  . No Known Allergies     Current Outpatient Medications  Medication Sig Dispense Refill  . aspirin EC 81 MG tablet Take 81 mg by mouth daily.    . bisacodyl (DULCOLAX) 5 MG EC tablet Take 5 mg by mouth daily as needed for moderate constipation.    Marland Kitchen esomeprazole (NEXIUM) 20 MG packet Take 20 mg by mouth daily before breakfast.    . folic acid (FOLVITE) 1 MG tablet Take 1 tablet by mouth 1 day or 1 dose.    . loratadine (CLARITIN) 10 MG tablet Take 10 mg by mouth daily as needed.    Marland Kitchen olmesartan (BENICAR) 20 MG tablet Take 20 mg by mouth daily.    Marland Kitchen pyridOXINE (B-6) 50 MG tablet Take 1 tablet by mouth.     No current facility-administered medications for this visit.    OBJECTIVE: There were no vitals filed for this visit.   There is no height or weight on file to calculate BMI.    ECOG FS:0 - Asymptomatic  LAB RESULTS:  Lab Results  Component Value Date   NA 137 11/12/2019   K 3.6 11/12/2019   CL 99 11/12/2019   CO2 27 11/12/2019   GLUCOSE 102 (H) 11/12/2019   BUN 8 11/12/2019   CREATININE 1.24 11/12/2019   CALCIUM 8.9 11/12/2019   PROT 7.1 11/12/2019   ALBUMIN 3.4 (L) 11/12/2019   AST 40 11/12/2019   ALT 19 11/12/2019   ALKPHOS 80 11/12/2019   BILITOT 1.3 (H) 11/12/2019   GFRNONAA 58 (L) 11/12/2019   GFRAA >60 11/12/2019    Lab Results  Component Value Date   WBC 9.4 11/12/2019   NEUTROABS 6.4 11/12/2019   HGB 14.9 11/12/2019   HCT 44.1 11/12/2019   MCV 106.5 (H)  11/12/2019   PLT 225 11/12/2019    STUDIES: No results found.  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   PLAN:    1. 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 K-ras mutation positive:  Patient had completed his adjuvant chemotherapy on December 09, 2014.  CT scan results from May 03, 2019 reviewed independently and reported as above with no obvious evidence of recurrent or progressive disease. CEA continues to be within normal limits at 2.2.  No intervention is needed  at this time.  Return to clinic in 6 months for laboratory work only and then in 1 year for the laboratory work, repeat imaging, and further evaluation. 2. Peripheral neuropathy: Chronic and unchanged.  Patient no longer takes any medications that were recommended or prescribed.   3. Rectal Pain/Pressure: Patient does not complain of this today.  Likely secondary to surgery. 4. Elevated MCV: Chronic and unchanged. Possibly secondary to history of chemotherapy. 5.  Pulmonary nodule: CT scan results from May 03, 2019 revealed persistent right apical pulmonary nodule that has increased 1 mm in size (6 mm to 7 mm) since December 2019.  Continue to monitor.  Repeat CT scan in 1 year as above.   I provided 15 minutes of non face-to-face telephone visit time during this encounter, and > 50% was spent counseling as documented under my assessment & plan.  Patient expressed understanding and was in agreement with this plan. He also understands that He can call clinic at any time with any questions, concerns, or complaints.    Lloyd Huger, MD   05/03/2020 9:10 AM

## 2020-05-04 ENCOUNTER — Ambulatory Visit
Admission: RE | Admit: 2020-05-04 | Discharge: 2020-05-04 | Disposition: A | Discharge status: Home / Self Care | Payer: Medicare HMO | Source: Ambulatory Visit | Attending: Oncology | Admitting: Oncology

## 2020-05-04 ENCOUNTER — Other Ambulatory Visit: Payer: Self-pay

## 2020-05-04 ENCOUNTER — Inpatient Hospital Stay: Payer: Medicare HMO | Attending: Oncology

## 2020-05-04 DIAGNOSIS — Z79899 Other long term (current) drug therapy: Secondary | ICD-10-CM | POA: Insufficient documentation

## 2020-05-04 DIAGNOSIS — R7989 Other specified abnormal findings of blood chemistry: Secondary | ICD-10-CM | POA: Diagnosis not present

## 2020-05-04 DIAGNOSIS — R5383 Other fatigue: Secondary | ICD-10-CM | POA: Insufficient documentation

## 2020-05-04 DIAGNOSIS — J439 Emphysema, unspecified: Secondary | ICD-10-CM | POA: Insufficient documentation

## 2020-05-04 DIAGNOSIS — R911 Solitary pulmonary nodule: Secondary | ICD-10-CM | POA: Insufficient documentation

## 2020-05-04 DIAGNOSIS — Z800 Family history of malignant neoplasm of digestive organs: Secondary | ICD-10-CM | POA: Diagnosis not present

## 2020-05-04 DIAGNOSIS — C20 Malignant neoplasm of rectum: Secondary | ICD-10-CM | POA: Insufficient documentation

## 2020-05-04 DIAGNOSIS — I700 Atherosclerosis of aorta: Secondary | ICD-10-CM | POA: Diagnosis not present

## 2020-05-04 DIAGNOSIS — C7802 Secondary malignant neoplasm of left lung: Secondary | ICD-10-CM | POA: Diagnosis present

## 2020-05-04 DIAGNOSIS — R531 Weakness: Secondary | ICD-10-CM | POA: Insufficient documentation

## 2020-05-04 DIAGNOSIS — Z803 Family history of malignant neoplasm of breast: Secondary | ICD-10-CM | POA: Insufficient documentation

## 2020-05-04 DIAGNOSIS — G629 Polyneuropathy, unspecified: Secondary | ICD-10-CM | POA: Insufficient documentation

## 2020-05-04 DIAGNOSIS — F17210 Nicotine dependence, cigarettes, uncomplicated: Secondary | ICD-10-CM | POA: Diagnosis not present

## 2020-05-04 DIAGNOSIS — I10 Essential (primary) hypertension: Secondary | ICD-10-CM | POA: Diagnosis not present

## 2020-05-04 DIAGNOSIS — K449 Diaphragmatic hernia without obstruction or gangrene: Secondary | ICD-10-CM | POA: Insufficient documentation

## 2020-05-04 LAB — CBC WITH DIFFERENTIAL/PLATELET
Abs Immature Granulocytes: 0.05 10*3/uL (ref 0.00–0.07)
Basophils Absolute: 0.1 10*3/uL (ref 0.0–0.1)
Basophils Relative: 2 %
Eosinophils Absolute: 0.2 10*3/uL (ref 0.0–0.5)
Eosinophils Relative: 4 %
HCT: 37.5 % — ABNORMAL LOW (ref 39.0–52.0)
Hemoglobin: 13.9 g/dL (ref 13.0–17.0)
Immature Granulocytes: 1 %
Lymphocytes Relative: 15 %
Lymphs Abs: 1 10*3/uL (ref 0.7–4.0)
MCH: 41.2 pg — ABNORMAL HIGH (ref 26.0–34.0)
MCHC: 37.1 g/dL — ABNORMAL HIGH (ref 30.0–36.0)
MCV: 111.3 fL — ABNORMAL HIGH (ref 80.0–100.0)
Monocytes Absolute: 0.6 10*3/uL (ref 0.1–1.0)
Monocytes Relative: 10 %
Neutro Abs: 4.7 10*3/uL (ref 1.7–7.7)
Neutrophils Relative %: 68 %
Platelets: 206 10*3/uL (ref 150–400)
RBC: 3.37 MIL/uL — ABNORMAL LOW (ref 4.22–5.81)
RDW: 14.2 % (ref 11.5–15.5)
WBC: 6.7 10*3/uL (ref 4.0–10.5)
nRBC: 0 % (ref 0.0–0.2)

## 2020-05-04 LAB — COMPREHENSIVE METABOLIC PANEL
ALT: 19 U/L (ref 0–44)
AST: 48 U/L — ABNORMAL HIGH (ref 15–41)
Albumin: 3.1 g/dL — ABNORMAL LOW (ref 3.5–5.0)
Alkaline Phosphatase: 92 U/L (ref 38–126)
Anion gap: 12 (ref 5–15)
BUN: 7 mg/dL — ABNORMAL LOW (ref 8–23)
CO2: 27 mmol/L (ref 22–32)
Calcium: 8.3 mg/dL — ABNORMAL LOW (ref 8.9–10.3)
Chloride: 95 mmol/L — ABNORMAL LOW (ref 98–111)
Creatinine, Ser: 0.99 mg/dL (ref 0.61–1.24)
GFR calc Af Amer: >60 mL/min (ref >60)
GFR calc non Af Amer: >60 mL/min (ref >60)
Glucose, Bld: 110 mg/dL — ABNORMAL HIGH (ref 70–99)
Potassium: 3.4 mmol/L — ABNORMAL LOW (ref 3.5–5.1)
Sodium: 134 mmol/L — ABNORMAL LOW (ref 135–145)
Total Bilirubin: 1.4 mg/dL — ABNORMAL HIGH (ref 0.3–1.2)
Total Protein: 6.5 g/dL (ref 6.5–8.1)

## 2020-05-04 MED ORDER — IOHEXOL 300 MG/ML  SOLN
100.0000 mL | Freq: Once | INTRAMUSCULAR | Status: AC | PRN
  Administered 2020-05-04: 100 mL via INTRAVENOUS

## 2020-05-05 LAB — CEA: CEA: 2.8 ng/mL (ref 0.0–4.7)

## 2020-05-07 ENCOUNTER — Encounter: Payer: Self-pay | Admitting: Oncology

## 2020-05-07 ENCOUNTER — Inpatient Hospital Stay: Payer: Medicare HMO | Admitting: Oncology

## 2020-05-07 NOTE — Progress Notes (Signed)
Called patient for pre assessment. He states he is having pain all over and rates pain at 7. He states he would like to physically seen scans and discuss results with provider at appointment tomorrow. He denied any questions or other concerns.

## 2020-05-08 ENCOUNTER — Other Ambulatory Visit: Payer: Self-pay

## 2020-05-08 ENCOUNTER — Inpatient Hospital Stay (HOSPITAL_BASED_OUTPATIENT_CLINIC_OR_DEPARTMENT_OTHER): Payer: Medicare HMO | Admitting: Oncology

## 2020-05-08 VITALS — BP 147/94 | HR 87 | Temp 97.7°F | Wt 193.7 lb

## 2020-05-08 DIAGNOSIS — C7802 Secondary malignant neoplasm of left lung: Secondary | ICD-10-CM | POA: Diagnosis not present

## 2020-05-08 DIAGNOSIS — C20 Malignant neoplasm of rectum: Secondary | ICD-10-CM | POA: Diagnosis not present

## 2020-05-08 NOTE — Progress Notes (Signed)
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 K-ras mutation positive.  INTERVAL HISTORY: Patient returns to clinic today for repeat laboratory work, further evaluation, discussion of his CT scan results.  Patient complains of increasing neuropathy over the past several months associated with worsening weakness and fatigue.  He is now in a wheelchair. He has no other neurologic complaints.  He denies any recent fevers or illnesses.  He has a good appetite and denies weight loss.  He denies any chest pain, shortness of breath, cough, or hemoptysis.  He denies any nausea, vomiting, constipation, or diarrhea. He has no melena or hematochezia. 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  Patient offers no further specific complaints today.  REVIEW OF SYSTEMS:   Review of Systems  Constitutional: Positive for malaise/fatigue. Negative for fever and weight loss.  Respiratory: Negative.  Negative for cough, hemoptysis and shortness of breath.   Cardiovascular: Negative.  Negative for chest pain and leg swelling.  Gastrointestinal: Negative for abdominal pain, blood in stool, melena, nausea and vomiting.  Genitourinary: Negative.  Negative for dysuria.  Musculoskeletal: Negative.  Negative for back pain.  Skin: Negative.  Negative for rash.  Neurological: Positive for sensory change and weakness. Negative for dizziness, focal weakness and headaches.  Psychiatric/Behavioral: Negative.  The patient is not nervous/anxious.     As per HPI. Otherwise, a  complete review of systems is negative.  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 [facility]) 09-2013   surgery and chemo   Smoker     PAST SURGICAL HISTORY: Past Surgical History:  Procedure Laterality Date   COLON RESECTION  2015   COLONOSCOPY  2015   EUS N/A 11/15/2013   Procedure: LOWER ENDOSCOPIC ULTRASOUND (EUS);  Surgeon: Milus Banister, MD;  Location: [facility];  Service: Endoscopy;  Laterality: N/A;   LUNG BIOPSY     LUNG LOBECTOMY Left    lung removed  2015   left lower    PLANTAR FASCIA RELEASE Right 09/20/2017   Procedure: PLANTAR FASCIA [id number], excision plantar fibroma right foot;  Surgeon: Samara Deist, DPM;  Location: [facility];  Service: Podiatry;  Laterality: Right;   TONSILLECTOMY AND ADENOIDECTOMY  Childhood   VARICOSE VEIN SURGERY  2010    FAMILY HISTORY Family History  Problem Relation Age of Onset   Hypertension Mother    Coronary artery disease Mother        angina   Cancer Mother        breast   Cancer Father 41       esophageal   COPD Brother        emphysema   Cancer Paternal Grandmother        colon   Diabetes Neg Hx        ADVANCED DIRECTIVES:  HEALTH MAINTENANCE: Social History   Tobacco Use   Smoking status: Current Every Day Smoker    Packs/day: 0.50    Years: 15.00    Pack years: 7.50    Types: Cigarettes   Smokeless tobacco: Never Used  Vaping Use   Vaping Use: Never used  Substance Use Topics   Alcohol use: Yes    Alcohol/week: 18.0 standard drinks    Types: 18 Shots of liquor per week    Comment: Regular 2-3 scotch/day   Drug use: No     Allergies  Allergen Reactions   No Known Allergies     Current Outpatient Medications    Medication Sig Dispense Refill   aspirin EC 81 MG tablet Take 81 mg by mouth daily.     bisacodyl (DULCOLAX) 5 MG EC tablet Take 5 mg by mouth daily as needed for moderate constipation.     esomeprazole (NEXIUM) 20 MG packet Take 20 mg by mouth daily before breakfast.     folic acid (FOLVITE) 1 MG tablet Take 1 tablet by mouth 1 day or 1 dose.     loratadine (CLARITIN) 10 MG tablet Take 10 mg by mouth daily as needed.     olmesartan (BENICAR) 20 MG tablet Take 20 mg by mouth daily.     pyridOXINE (B-6) 50 MG tablet Take 1 tablet by mouth.     No current facility-administered medications for this visit.    OBJECTIVE: Vitals:   05/08/20 0908  BP: (!) 147/94  Pulse: 87  Temp: 97.7 F (36.5 C)  SpO2: 100%     Body mass index is 31.26 kg/m.    ECOG FS:0 - Asymptomatic  LAB RESULTS:  Lab Results  Component Value Date   NA 134 (L) 05/04/2020   K 3.4 (L) 05/04/2020   CL 95 (L) 05/04/2020   CO2 27 05/04/2020   GLUCOSE 110 (H) 05/04/2020   BUN 7 (L) 05/04/2020   CREATININE 0.99 05/04/2020   CALCIUM 8.3 (L) 05/04/2020   PROT 6.5 05/04/2020   ALBUMIN 3.1 (L) 05/04/2020   AST 48 (H) 05/04/2020   ALT 19 05/04/2020   ALKPHOS 92 05/04/2020   BILITOT 1.4 (H) 05/04/2020   GFRNONAA >60 05/04/2020   GFRAA >60 05/04/2020    Lab Results  Component Value Date   WBC 6.7 05/04/2020   NEUTROABS 4.7 05/04/2020   HGB 13.9 05/04/2020   HCT 37.5 (L) 05/04/2020   MCV 111.3 (H) 05/04/2020   PLT 206 05/04/2020    STUDIES: CT Chest W Contrast  Result Date: 05/04/2020 CLINICAL DATA:  Restaging of rectal cancer EXAM: CT CHEST, ABDOMEN, AND PELVIS WITH CONTRAST TECHNIQUE: Multidetector CT imaging of the chest, abdomen and pelvis was performed following the standard protocol during bolus administration of intravenous contrast. CONTRAST:  128m OMNIPAQUE IOHEXOL 300 MG/ML  SOLN COMPARISON:  Multiple priors most recent 05/03/2019 FINDINGS: CT CHEST FINDINGS Cardiovascular: Calcified  atheromatous plaque of the thoracic aorta. No aneurysmal dilation. Central pulmonary vasculature is normal by venous phase assessment. Heart size is normal with three-vessel coronary artery calcification. No pericardial effusion. Mediastinum/Nodes: Esophagus grossly normal. Thoracic inlet structures are normal. No axillary lymphadenopathy. No mediastinal or hilar lymphadenopathy. Lungs/Pleura: Subsolid nodule in the RIGHT upper lobe is increasing in size This measures 10 x 11 mm (image 33, series 3) previously approximately 6 x 6 mm. Upon comparison with the study of October 30, 2018 this measured approximately 5 mm. More dense than on previous imaging centrally developing area of more solid-appearing  density measuring approximately 4 mm. Subpleural reticulation, paraseptal emphysema and pleural and parenchymal scarring in the chest is similar to the previous study. Chain suture in the LEFT lower lobe is similar. Some pleural thickening along the dependent RIGHT chest is new to. Musculoskeletal: No chest wall lesion. CT ABDOMEN PELVIS FINDINGS Hepatobiliary: Hepatic steatosis, mildly nodular hepatic contours. Accessory or replaced RIGHT select RIGHT LEFT hepatic artery arises from LEFT gastric artery. No focal hepatic lesion. No pericholecystic stranding. No ductal dilation. Pancreas: Pancreas without focal lesion or peripancreatic inflammation. Spleen: Spleen normal size. Adrenals/Urinary Tract: Adrenal glands with stable mild nodular thickening of the LEFT adrenal gland measuring approximately 9 mm unchanged since 2019. RIGHT renal cyst arising posteriorly from the RIGHT kidney also small cyst in the upper pole. No hydronephrosis. Stomach/Bowel: Small hiatal hernia. Signs of APR. Small-bowel loops descent into the pelvis, some tethering of small bowel loops suggested to the anterior sacrum at the sacral promontory is unchanged and not associated with bowel obstruction. The appendix is normal. Vascular/Lymphatic:  Calcific and noncalcific atheromatous plaque of the abdominal aorta. No aneurysm. No adenopathy. No pelvic adenopathy. Reproductive: Not well assessed. Other: Parastomal herniation of fat. This is about the LEFT lower quadrant. Small amount of fluid and or stranding within the peristomal hernia. Mild skin thickening over the site of the ostomy. Musculoskeletal: Spinal degenerative changes. No acute or destructive bone process. IMPRESSION: 1. Subsolid nodule in the RIGHT upper lobe is increasing in size. This measures 10 x 11 mm previously approximately 6 x 6 mm. More dense than on previous imaging centrally developing area of more solid-appearing density measuring approximately 4 mm. Findings raise the question of developing bronchogenic neoplasm. The appearance would not suggest metastatic disease from colorectal primary. Three to six-month follow-up chest CT is suggested with continued follow-up on subsequent imaging studies. 2. Some pleural thickening along the dependent RIGHT chest is new to the previous study. Given subpleural reticulation in the chest question of background interstitial lung disease superimposed on emphysema is considered. Correlation with any respiratory symptoms and upon follow-up for the above finding could consider high-resolution chest CT. 3. Hepatic steatosis, mildly nodular hepatic contours. Correlate with any clinical or laboratory evidence of liver disease. 4. Parastomal herniation of fat about the LEFT lower quadrant. Small amount of fluid and or stranding within the peristomal hernia. Mild skin thickening over the site of the ostomy. Correlate with any signs of cellulitis or inflammation about the patient's ostomy on exam. 5. Emphysema and aortic atherosclerosis. Aortic Atherosclerosis (ICD10-I70.0) and Emphysema (ICD10-J43.9). Electronically Signed   By: Donzetta Kohut M.D.   On: 05/04/2020 13:11   CT ABDOMEN PELVIS W CONTRAST  Result Date: 05/04/2020 CLINICAL DATA:  Restaging  of rectal cancer EXAM: CT CHEST, ABDOMEN, AND PELVIS WITH CONTRAST TECHNIQUE: Multidetector CT imaging of the chest, abdomen and pelvis was performed following the standard protocol during bolus administration of intravenous contrast. CONTRAST:  OMNIPAQUE IOHEXOL 300 MG/ML  SOLN COMPARISON:  Multiple priors most recent 05/03/2019 FINDINGS: CT CHEST FINDINGS Cardiovascular: Calcified atheromatous plaque of the thoracic aorta. No aneurysmal dilation. Central pulmonary vasculature is normal by venous phase assessment. Heart size is normal with three-vessel coronary artery calcification. No pericardial effusion. Mediastinum/Nodes: Esophagus grossly normal. Thoracic inlet structures are normal. No axillary lymphadenopathy. No mediastinal or hilar lymphadenopathy. Lungs/Pleura: Subsolid nodule in the RIGHT upper lobe is increasing in size This measures 10 x 11 mm (image 33, series 3) previously approximately 6 x 6 mm. Upon comparison with the study of  October 30, 2018 this measured approximately 5 mm. More dense than on previous imaging centrally developing area of more solid-appearing density measuring approximately 4 mm. Subpleural reticulation, paraseptal emphysema and pleural and parenchymal scarring in the chest is similar to the previous study. Chain suture in the LEFT lower lobe is similar. Some pleural thickening along the dependent RIGHT chest is new to. Musculoskeletal: No chest wall lesion. CT ABDOMEN PELVIS FINDINGS Hepatobiliary: Hepatic steatosis, mildly nodular hepatic contours. Accessory or replaced RIGHT select RIGHT LEFT hepatic artery arises from LEFT gastric artery. No focal hepatic lesion. No pericholecystic stranding. No ductal dilation. Pancreas: Pancreas without focal lesion or peripancreatic inflammation. Spleen: Spleen normal size. Adrenals/Urinary Tract: Adrenal glands with stable mild nodular thickening of the LEFT adrenal gland measuring approximately 9 mm unchanged since 2019. RIGHT  renal cyst arising posteriorly from the RIGHT kidney also small cyst in the upper pole. No hydronephrosis. Stomach/Bowel: Small hiatal hernia. Signs of APR. Small-bowel loops descent into the pelvis, some tethering of small bowel loops suggested to the anterior sacrum at the sacral promontory is unchanged and not associated with bowel obstruction. The appendix is normal. Vascular/Lymphatic: Calcific and noncalcific atheromatous plaque of the abdominal aorta. No aneurysm. No adenopathy. No pelvic adenopathy. Reproductive: Not well assessed. Other: Parastomal herniation of fat. This is about the LEFT lower quadrant. Small amount of fluid and or stranding within the peristomal hernia. Mild skin thickening over the site of the ostomy. Musculoskeletal: Spinal degenerative changes. No acute or destructive bone process. IMPRESSION: 1. Subsolid nodule in the RIGHT upper lobe is increasing in size. This measures 10 x 11 mm previously approximately 6 x 6 mm. More dense than on previous imaging centrally developing area of more solid-appearing density measuring approximately 4 mm. Findings raise the question of developing bronchogenic neoplasm. The appearance would not suggest metastatic disease from colorectal primary. Three to six-month follow-up chest CT is suggested with continued follow-up on subsequent imaging studies. 2. Some pleural thickening along the dependent RIGHT chest is new to the previous study. Given subpleural reticulation in the chest question of background interstitial lung disease superimposed on emphysema is considered. Correlation with any respiratory symptoms and upon follow-up for the above finding could consider high-resolution chest CT. 3. Hepatic steatosis, mildly nodular hepatic contours. Correlate with any clinical or laboratory evidence of liver disease. 4. Parastomal herniation of fat about the LEFT lower quadrant. Small amount of fluid and or stranding within the peristomal hernia. Mild skin  thickening over the site of the ostomy. Correlate with any signs of cellulitis or inflammation about the patient's ostomy on exam. 5. Emphysema and aortic atherosclerosis. Aortic Atherosclerosis (ICD10-I70.0) and Emphysema (ICD10-J43.9). Electronically Signed   By: Zetta Bills M.D.   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   PLAN:    1. 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 K-ras mutation positive:  Patient had completed his adjuvant chemotherapy on December 09, 2014.  CT scan results from May 04, 2020 reviewed independently and reported as above with no obvious evidence of recurrent or progressive disease, but right upper lobe lesion has seemed to increase in size.  This is possibly a second primary.  CEA continues to be within normal limits at 2.8.  Will get a PET scan to further evaluate right upper lobe lesion.  Return to clinic 1 to 2 days later to discuss the results. 2. Peripheral neuropathy: Worse.  Patient  no longer takes any medications that were recommended or prescribed.  Have recommended home health and physical therapy to which patient agreed. 3. Rectal Pain/Pressure: Patient does not complain of this today.  Likely secondary to surgery. 4. Elevated MCV: Chronic and unchanged. Possibly secondary to history of chemotherapy. 5.  Pulmonary nodule: CT scan results as above.  Will get PET scan to further evaluate.  I spent a total of 30 minutes reviewing chart data, face-to-face evaluation with the patient, counseling and coordination of care as detailed above.   Patient expressed understanding and was in agreement with this plan. He also understands that He can call clinic at any time with any questions, concerns, or complaints.    Lloyd Huger, MD   05/08/2020 1:24 PM

## 2020-05-09 NOTE — Progress Notes (Deleted)
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 K-ras mutation positive.  INTERVAL HISTORY: Patient returns to clinic today for repeat laboratory work, further evaluation, discussion of his CT scan results.  Patient complains of increasing neuropathy over the past several months associated with worsening weakness and fatigue.  He is now in a wheelchair. He has no other neurologic complaints.  He denies any recent fevers or illnesses.  He has a good appetite and denies weight loss.  He denies any chest pain, shortness of breath, cough, or hemoptysis.  He denies any nausea, vomiting, constipation, or diarrhea. He has no melena or hematochezia. 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  Patient offers no further specific complaints today.  REVIEW OF SYSTEMS:   Review of Systems  Constitutional: Positive for malaise/fatigue. Negative for fever and weight loss.  Respiratory: Negative.  Negative for cough, hemoptysis and shortness of breath.   Cardiovascular: Negative.  Negative for chest pain and leg swelling.  Gastrointestinal: Negative for abdominal pain, blood in stool, melena, nausea and vomiting.  Genitourinary: Negative.  Negative for dysuria.  Musculoskeletal: Negative.  Negative for back pain.  Skin: Negative.  Negative for rash.  Neurological: Positive for sensory change and weakness. Negative for dizziness, focal weakness and headaches.  Psychiatric/Behavioral: Negative.  The patient is not nervous/anxious.     As per HPI. Otherwise, a  complete review of systems is negative.  PAST MEDICAL HISTORY: Past Medical History:  Diagnosis Date  . Allergic rhinitis   . Blood clot in vein 2015   left leg   . History of anemia   . History of chicken pox   . HTN (hypertension)   . Neuropathy    arms and legs - S/P Cancer tx  . Rectal cancer metastasized to lung [facility]) 09-2013   surgery and chemo  . Smoker     PAST SURGICAL HISTORY: Past Surgical History:  Procedure Laterality Date  . COLON RESECTION  2015  . COLONOSCOPY  2015  . EUS N/A 11/15/2013   Procedure: LOWER ENDOSCOPIC ULTRASOUND (EUS);  Surgeon: Milus Banister, MD;  Location: [facility];  Service: Endoscopy;  Laterality: N/A;  . LUNG BIOPSY    . LUNG LOBECTOMY Left   . lung removed  2015   left lower   . PLANTAR FASCIA RELEASE Right 09/20/2017   Procedure: PLANTAR FASCIA [id number], excision plantar fibroma right foot;  Surgeon: Samara Deist, DPM;  Location: [facility];  Service: Podiatry;  Laterality: Right;  . TONSILLECTOMY AND ADENOIDECTOMY  Childhood  . VARICOSE VEIN SURGERY  2010    FAMILY HISTORY Family History  Problem Relation Age of Onset  . Hypertension Mother   . Coronary artery disease Mother        angina  . Cancer Mother        breast  . Cancer Father 25       esophageal  . COPD Brother        emphysema  . Cancer Paternal Grandmother        colon  . Diabetes Neg Hx        ADVANCED DIRECTIVES:  HEALTH MAINTENANCE: Social History   Tobacco Use  . Smoking status: Current Every Day Smoker    Packs/day: 0.50    Years: 15.00    Pack years: 7.50    Types: Cigarettes  . Smokeless tobacco: Never Used  Vaping Use  . Vaping Use: Never used  Substance Use Topics  . Alcohol use: Yes    Alcohol/week: 18.0 standard drinks    Types: 18 Shots of liquor per week    Comment: Regular 2-3 scotch/day  . Drug use: No     Allergies  Allergen Reactions  . No Known Allergies     Current Outpatient Medications    Medication Sig Dispense Refill  . aspirin EC 81 MG tablet Take 81 mg by mouth daily.    . bisacodyl (DULCOLAX) 5 MG EC tablet Take 5 mg by mouth daily as needed for moderate constipation.    Marland Kitchen esomeprazole (NEXIUM) 20 MG packet Take 20 mg by mouth daily before breakfast.    . folic acid (FOLVITE) 1 MG tablet Take 1 tablet by mouth 1 day or 1 dose.    . loratadine (CLARITIN) 10 MG tablet Take 10 mg by mouth daily as needed.    Marland Kitchen olmesartan (BENICAR) 20 MG tablet Take 20 mg by mouth daily.    Marland Kitchen pyridOXINE (B-6) 50 MG tablet Take 1 tablet by mouth.     No current facility-administered medications for this visit.    OBJECTIVE: There were no vitals filed for this visit.   There is no height or weight on file to calculate BMI.    ECOG FS:0 - Asymptomatic  LAB RESULTS:  Lab Results  Component Value Date   NA 134 (L) 05/04/2020   K 3.4 (L) 05/04/2020   CL 95 (L) 05/04/2020   CO2 27 05/04/2020   GLUCOSE 110 (H) 05/04/2020   BUN 7 (L) 05/04/2020   CREATININE 0.99 05/04/2020   CALCIUM 8.3 (L) 05/04/2020   PROT 6.5 05/04/2020   ALBUMIN 3.1 (L) 05/04/2020   AST 48 (H) 05/04/2020   ALT 19 05/04/2020   ALKPHOS 92 05/04/2020   BILITOT 1.4 (H) 05/04/2020   GFRNONAA >60 05/04/2020   GFRAA >60 05/04/2020    Lab Results  Component Value Date   WBC 6.7 05/04/2020   NEUTROABS 4.7 05/04/2020   HGB 13.9 05/04/2020   HCT 37.5 (L) 05/04/2020   MCV 111.3 (H) 05/04/2020   PLT 206 05/04/2020    STUDIES: CT Chest W Contrast  Result Date: 05/04/2020 CLINICAL DATA:  Restaging of rectal cancer EXAM: CT CHEST, ABDOMEN, AND PELVIS WITH CONTRAST TECHNIQUE: Multidetector CT imaging of the chest, abdomen and pelvis was performed following the standard protocol during bolus administration of intravenous contrast. CONTRAST:  158m OMNIPAQUE IOHEXOL 300 MG/ML  SOLN COMPARISON:  Multiple priors most recent 05/03/2019 FINDINGS: CT CHEST FINDINGS Cardiovascular: Calcified atheromatous plaque of the thoracic  aorta. No aneurysmal dilation. Central pulmonary vasculature is normal by venous phase assessment. Heart size is normal with three-vessel coronary artery calcification. No pericardial effusion. Mediastinum/Nodes: Esophagus grossly normal. Thoracic inlet structures are normal. No axillary lymphadenopathy. No mediastinal or hilar lymphadenopathy. Lungs/Pleura: Subsolid nodule in the RIGHT upper lobe is increasing in size This measures 10 x 11 mm (image 33, series 3) previously approximately 6 x 6 mm. Upon comparison with the study of October 30, 2018 this measured approximately 5 mm. More dense than on previous imaging centrally developing area of more solid-appearing density measuring approximately 4 mm. Subpleural reticulation, paraseptal emphysema and  pleural and parenchymal scarring in the chest is similar to the previous study. Chain suture in the LEFT lower lobe is similar. Some pleural thickening along the dependent RIGHT chest is new to. Musculoskeletal: No chest wall lesion. CT ABDOMEN PELVIS FINDINGS Hepatobiliary: Hepatic steatosis, mildly nodular hepatic contours. Accessory or replaced RIGHT select RIGHT LEFT hepatic artery arises from LEFT gastric artery. No focal hepatic lesion. No pericholecystic stranding. No ductal dilation. Pancreas: Pancreas without focal lesion or peripancreatic inflammation. Spleen: Spleen normal size. Adrenals/Urinary Tract: Adrenal glands with stable mild nodular thickening of the LEFT adrenal gland measuring approximately 9 mm unchanged since 2019. RIGHT renal cyst arising posteriorly from the RIGHT kidney also small cyst in the upper pole. No hydronephrosis. Stomach/Bowel: Small hiatal hernia. Signs of APR. Small-bowel loops descent into the pelvis, some tethering of small bowel loops suggested to the anterior sacrum at the sacral promontory is unchanged and not associated with bowel obstruction. The appendix is normal. Vascular/Lymphatic: Calcific and noncalcific  atheromatous plaque of the abdominal aorta. No aneurysm. No adenopathy. No pelvic adenopathy. Reproductive: Not well assessed. Other: Parastomal herniation of fat. This is about the LEFT lower quadrant. Small amount of fluid and or stranding within the peristomal hernia. Mild skin thickening over the site of the ostomy. Musculoskeletal: Spinal degenerative changes. No acute or destructive bone process. IMPRESSION: 1. Subsolid nodule in the RIGHT upper lobe is increasing in size. This measures 10 x 11 mm previously approximately 6 x 6 mm. More dense than on previous imaging centrally developing area of more solid-appearing density measuring approximately 4 mm. Findings raise the question of developing bronchogenic neoplasm. The appearance would not suggest metastatic disease from colorectal primary. Three to six-month follow-up chest CT is suggested with continued follow-up on subsequent imaging studies. 2. Some pleural thickening along the dependent RIGHT chest is new to the previous study. Given subpleural reticulation in the chest question of background interstitial lung disease superimposed on emphysema is considered. Correlation with any respiratory symptoms and upon follow-up for the above finding could consider high-resolution chest CT. 3. Hepatic steatosis, mildly nodular hepatic contours. Correlate with any clinical or laboratory evidence of liver disease. 4. Parastomal herniation of fat about the LEFT lower quadrant. Small amount of fluid and or stranding within the peristomal hernia. Mild skin thickening over the site of the ostomy. Correlate with any signs of cellulitis or inflammation about the patient's ostomy on exam. 5. Emphysema and aortic atherosclerosis. Aortic Atherosclerosis (ICD10-I70.0) and Emphysema (ICD10-J43.9). Electronically Signed   By: Zetta Bills M.D.   On: 05/04/2020 13:11   CT ABDOMEN PELVIS W CONTRAST  Result Date: 05/04/2020 CLINICAL DATA:  Restaging of rectal cancer EXAM: CT  CHEST, ABDOMEN, AND PELVIS WITH CONTRAST TECHNIQUE: Multidetector CT imaging of the chest, abdomen and pelvis was performed following the standard protocol during bolus administration of intravenous contrast. CONTRAST:  153m OMNIPAQUE IOHEXOL 300 MG/ML  SOLN COMPARISON:  Multiple priors most recent 05/03/2019 FINDINGS: CT CHEST FINDINGS Cardiovascular: Calcified atheromatous plaque of the thoracic aorta. No aneurysmal dilation. Central pulmonary vasculature is normal by venous phase assessment. Heart size is normal with three-vessel coronary artery calcification. No pericardial effusion. Mediastinum/Nodes: Esophagus grossly normal. Thoracic inlet structures are normal. No axillary lymphadenopathy. No mediastinal or hilar lymphadenopathy. Lungs/Pleura: Subsolid nodule in the RIGHT upper lobe is increasing in size This measures 10 x 11 mm (image 33, series 3) previously approximately 6 x 6 mm. Upon comparison with the study of October 30, 2018 this measured approximately 5 mm. More dense  than on previous imaging centrally developing area of more solid-appearing density measuring approximately 4 mm. Subpleural reticulation, paraseptal emphysema and pleural and parenchymal scarring in the chest is similar to the previous study. Chain suture in the LEFT lower lobe is similar. Some pleural thickening along the dependent RIGHT chest is new to. Musculoskeletal: No chest wall lesion. CT ABDOMEN PELVIS FINDINGS Hepatobiliary: Hepatic steatosis, mildly nodular hepatic contours. Accessory or replaced RIGHT select RIGHT LEFT hepatic artery arises from LEFT gastric artery. No focal hepatic lesion. No pericholecystic stranding. No ductal dilation. Pancreas: Pancreas without focal lesion or peripancreatic inflammation. Spleen: Spleen normal size. Adrenals/Urinary Tract: Adrenal glands with stable mild nodular thickening of the LEFT adrenal gland measuring approximately 9 mm unchanged since 2019. RIGHT renal cyst arising  posteriorly from the RIGHT kidney also small cyst in the upper pole. No hydronephrosis. Stomach/Bowel: Small hiatal hernia. Signs of APR. Small-bowel loops descent into the pelvis, some tethering of small bowel loops suggested to the anterior sacrum at the sacral promontory is unchanged and not associated with bowel obstruction. The appendix is normal. Vascular/Lymphatic: Calcific and noncalcific atheromatous plaque of the abdominal aorta. No aneurysm. No adenopathy. No pelvic adenopathy. Reproductive: Not well assessed. Other: Parastomal herniation of fat. This is about the LEFT lower quadrant. Small amount of fluid and or stranding within the peristomal hernia. Mild skin thickening over the site of the ostomy. Musculoskeletal: Spinal degenerative changes. No acute or destructive bone process. IMPRESSION: 1. Subsolid nodule in the RIGHT upper lobe is increasing in size. This measures 10 x 11 mm previously approximately 6 x 6 mm. More dense than on previous imaging centrally developing area of more solid-appearing density measuring approximately 4 mm. Findings raise the question of developing bronchogenic neoplasm. The appearance would not suggest metastatic disease from colorectal primary. Three to six-month follow-up chest CT is suggested with continued follow-up on subsequent imaging studies. 2. Some pleural thickening along the dependent RIGHT chest is new to the previous study. Given subpleural reticulation in the chest question of background interstitial lung disease superimposed on emphysema is considered. Correlation with any respiratory symptoms and upon follow-up for the above finding could consider high-resolution chest CT. 3. Hepatic steatosis, mildly nodular hepatic contours. Correlate with any clinical or laboratory evidence of liver disease. 4. Parastomal herniation of fat about the LEFT lower quadrant. Small amount of fluid and or stranding within the peristomal hernia. Mild skin thickening over the  site of the ostomy. Correlate with any signs of cellulitis or inflammation about the patient's ostomy on exam. 5. Emphysema and aortic atherosclerosis. Aortic Atherosclerosis (ICD10-I70.0) and Emphysema (ICD10-J43.9). Electronically Signed   By: Zetta Bills M.D.   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   PLAN:    1. 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 K-ras mutation positive:  Patient had completed his adjuvant chemotherapy on December 09, 2014.  CT scan results from May 04, 2020 reviewed independently and reported as above with no obvious evidence of recurrent or progressive disease, but right upper lobe lesion has seemed to increase in size.  This is possibly a second primary.  CEA continues to be within normal limits at 2.8.  Will get a PET scan to further evaluate right upper lobe lesion.  Return to clinic 1 to 2 days later to discuss the results. 2. Peripheral neuropathy: Worse.  Patient no longer takes any medications that were recommended or prescribed.  Have recommended home health and physical therapy to which patient agreed. 3. Rectal Pain/Pressure: Patient does not complain of this today.  Likely secondary to surgery. 4. Elevated MCV: Chronic and unchanged. Possibly secondary to history of chemotherapy. 5.  Pulmonary nodule: CT scan results as above.  Will get PET scan to further evaluate.  I spent a total of 30 minutes reviewing chart data, face-to-face evaluation with the patient, counseling and coordination of care as detailed above.   Patient expressed understanding and was in agreement with this plan. He also understands that He can call clinic at any time with any questions, concerns, or complaints.    Lloyd Huger, MD   05/09/2020 9:17 AM

## 2020-05-14 ENCOUNTER — Ambulatory Visit: Payer: Medicare HMO

## 2020-05-15 ENCOUNTER — Inpatient Hospital Stay: Payer: Medicare HMO | Admitting: Oncology

## 2020-05-17 NOTE — Progress Notes (Signed)
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 K-ras mutation positive.  INTERVAL HISTORY: Patient returns to clinic today for further evaluation and discussion of his PET scan results.  He continues to have neuropathy and weakness and fatigue. He has no other neurologic complaints.  He denies any recent fevers or illnesses.  He has a good appetite and denies weight loss.  He denies any chest pain, shortness of breath, cough, or hemoptysis.  He denies any nausea, vomiting, constipation, or diarrhea. He has no melena or hematochezia. 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  Patient offers no further specific complaints today.  REVIEW OF SYSTEMS:   Review of Systems  Constitutional: Positive for malaise/fatigue. Negative for fever and weight loss.  Respiratory: Negative.  Negative for cough, hemoptysis and shortness of breath.   Cardiovascular: Negative.  Negative for chest pain and leg swelling.  Gastrointestinal: Negative for abdominal pain, blood in stool, melena, nausea and vomiting.  Genitourinary: Negative.  Negative for dysuria.  Musculoskeletal: Negative.  Negative for back pain.  Skin: Negative.  Negative for rash.  Neurological: Positive for sensory change and weakness. Negative for dizziness, focal weakness and headaches.  Psychiatric/Behavioral: Negative.  The patient is not nervous/anxious.     As per HPI. Otherwise, a complete review of systems is negative.  PAST MEDICAL HISTORY: Past Medical History:  Diagnosis Date  .  Allergic rhinitis   . Blood clot in vein 2015   left leg   . History of anemia   . History of chicken pox   . HTN (hypertension)   . Neuropathy    arms and legs - S/P Cancer tx  . Rectal cancer metastasized to lung [facility]) 09-2013   surgery and chemo  . Smoker     PAST SURGICAL HISTORY: Past Surgical History:  Procedure Laterality Date  . COLON RESECTION  2015  . COLONOSCOPY  2015  . EUS N/A 11/15/2013   Procedure: LOWER ENDOSCOPIC ULTRASOUND (EUS);  Surgeon: Milus Banister, MD;  Location: [facility];  Service: Endoscopy;  Laterality: N/A;  . LUNG BIOPSY    . LUNG LOBECTOMY Left   . lung removed  2015   left lower   . PLANTAR FASCIA RELEASE Right 09/20/2017   Procedure: PLANTAR FASCIA [id number], excision plantar fibroma right foot;  Surgeon: Samara Deist, DPM;  Location: [facility];  Service: Podiatry;  Laterality: Right;  . TONSILLECTOMY AND ADENOIDECTOMY  Childhood  . VARICOSE VEIN SURGERY  2010    FAMILY HISTORY Family History  Problem Relation Age of Onset  . Hypertension Mother   . Coronary artery disease Mother        angina  . Cancer Mother        breast  . Cancer Father 20       esophageal  . COPD Brother        emphysema  . Cancer Paternal Grandmother        colon  . Diabetes Neg Hx        ADVANCED DIRECTIVES:    HEALTH MAINTENANCE: Social History   Tobacco Use  . Smoking status: Current  Every Day Smoker    Packs/day: 0.50    Years: 15.00    Pack years: 7.50    Types: Cigarettes  . Smokeless tobacco: Never Used  Vaping Use  . Vaping Use: Never used  Substance Use Topics  . Alcohol use: Yes    Alcohol/week: 18.0 standard drinks    Types: 18 Shots of liquor per week    Comment: Regular 2-3 scotch/day  . Drug use: No     Allergies  Allergen Reactions  . No Known Allergies     Current Outpatient Medications  Medication Sig Dispense Refill  . aspirin EC 81 MG tablet Take 81 mg by mouth daily.    . bisacodyl (DULCOLAX) 5 MG  EC tablet Take 5 mg by mouth daily as needed for moderate constipation.    Marland Kitchen esomeprazole (NEXIUM) 20 MG packet Take 20 mg by mouth daily before breakfast.    . folic acid (FOLVITE) 1 MG tablet Take 1 tablet by mouth 1 day or 1 dose.    . loratadine (CLARITIN) 10 MG tablet Take 10 mg by mouth daily as needed.    Marland Kitchen olmesartan (BENICAR) 20 MG tablet Take 20 mg by mouth daily.    Marland Kitchen pyridOXINE (B-6) 50 MG tablet Take 1 tablet by mouth.     No current facility-administered medications for this visit.    OBJECTIVE: Vitals:   05/22/20 0954  BP: (!) 163/95  Pulse: 82  Temp: (!) 97.1 F (36.2 C)  SpO2: 98%     Body mass index is 31.78 kg/m.    ECOG FS:0 - Asymptomatic  LAB RESULTS:  Lab Results  Component Value Date   NA 134 (L) 05/04/2020   K 3.4 (L) 05/04/2020   CL 95 (L) 05/04/2020   CO2 27 05/04/2020   GLUCOSE 110 (H) 05/04/2020   BUN 7 (L) 05/04/2020   CREATININE 0.99 05/04/2020   CALCIUM 8.3 (L) 05/04/2020   PROT 6.5 05/04/2020   ALBUMIN 3.1 (L) 05/04/2020   AST 48 (H) 05/04/2020   ALT 19 05/04/2020   ALKPHOS 92 05/04/2020   BILITOT 1.4 (H) 05/04/2020   GFRNONAA >60 05/04/2020   GFRAA >60 05/04/2020    Lab Results  Component Value Date   WBC 6.7 05/04/2020   NEUTROABS 4.7 05/04/2020   HGB 13.9 05/04/2020   HCT 37.5 (L) 05/04/2020   MCV 111.3 (H) 05/04/2020   PLT 206 05/04/2020    STUDIES: CT Chest W Contrast  Result Date: 05/04/2020 CLINICAL DATA:  Restaging of rectal cancer EXAM: CT CHEST, ABDOMEN, AND PELVIS WITH CONTRAST TECHNIQUE: Multidetector CT imaging of the chest, abdomen and pelvis was performed following the standard protocol during bolus administration of intravenous contrast. CONTRAST:  125m OMNIPAQUE IOHEXOL 300 MG/ML  SOLN COMPARISON:  Multiple priors most recent 05/03/2019 FINDINGS: CT CHEST FINDINGS Cardiovascular: Calcified atheromatous plaque of the thoracic aorta. No aneurysmal dilation. Central pulmonary vasculature is normal by venous phase  assessment. Heart size is normal with three-vessel coronary artery calcification. No pericardial effusion. Mediastinum/Nodes: Esophagus grossly normal. Thoracic inlet structures are normal. No axillary lymphadenopathy. No mediastinal or hilar lymphadenopathy. Lungs/Pleura: Subsolid nodule in the RIGHT upper lobe is increasing in size This measures 10 x 11 mm (image 33, series 3) previously approximately 6 x 6 mm. Upon comparison with the study of October 30, 2018 this measured approximately 5 mm. More dense than on previous imaging centrally developing area of more solid-appearing density measuring approximately 4 mm. Subpleural reticulation, paraseptal emphysema and pleural and parenchymal  scarring in the chest is similar to the previous study. Chain suture in the LEFT lower lobe is similar. Some pleural thickening along the dependent RIGHT chest is new to. Musculoskeletal: No chest wall lesion. CT ABDOMEN PELVIS FINDINGS Hepatobiliary: Hepatic steatosis, mildly nodular hepatic contours. Accessory or replaced RIGHT select RIGHT LEFT hepatic artery arises from LEFT gastric artery. No focal hepatic lesion. No pericholecystic stranding. No ductal dilation. Pancreas: Pancreas without focal lesion or peripancreatic inflammation. Spleen: Spleen normal size. Adrenals/Urinary Tract: Adrenal glands with stable mild nodular thickening of the LEFT adrenal gland measuring approximately 9 mm unchanged since 2019. RIGHT renal cyst arising posteriorly from the RIGHT kidney also small cyst in the upper pole. No hydronephrosis. Stomach/Bowel: Small hiatal hernia. Signs of APR. Small-bowel loops descent into the pelvis, some tethering of small bowel loops suggested to the anterior sacrum at the sacral promontory is unchanged and not associated with bowel obstruction. The appendix is normal. Vascular/Lymphatic: Calcific and noncalcific atheromatous plaque of the abdominal aorta. No aneurysm. No adenopathy. No pelvic adenopathy.  Reproductive: Not well assessed. Other: Parastomal herniation of fat. This is about the LEFT lower quadrant. Small amount of fluid and or stranding within the peristomal hernia. Mild skin thickening over the site of the ostomy. Musculoskeletal: Spinal degenerative changes. No acute or destructive bone process. IMPRESSION: 1. Subsolid nodule in the RIGHT upper lobe is increasing in size. This measures 10 x 11 mm previously approximately 6 x 6 mm. More dense than on previous imaging centrally developing area of more solid-appearing density measuring approximately 4 mm. Findings raise the question of developing bronchogenic neoplasm. The appearance would not suggest metastatic disease from colorectal primary. Three to six-month follow-up chest CT is suggested with continued follow-up on subsequent imaging studies. 2. Some pleural thickening along the dependent RIGHT chest is new to the previous study. Given subpleural reticulation in the chest question of background interstitial lung disease superimposed on emphysema is considered. Correlation with any respiratory symptoms and upon follow-up for the above finding could consider high-resolution chest CT. 3. Hepatic steatosis, mildly nodular hepatic contours. Correlate with any clinical or laboratory evidence of liver disease. 4. Parastomal herniation of fat about the LEFT lower quadrant. Small amount of fluid and or stranding within the peristomal hernia. Mild skin thickening over the site of the ostomy. Correlate with any signs of cellulitis or inflammation about the patient's ostomy on exam. 5. Emphysema and aortic atherosclerosis. Aortic Atherosclerosis (ICD10-I70.0) and Emphysema (ICD10-J43.9). Electronically Signed   By: Zetta Bills M.D.   On: 05/04/2020 13:11   CT ABDOMEN PELVIS W CONTRAST  Result Date: 05/04/2020 CLINICAL DATA:  Restaging of rectal cancer EXAM: CT CHEST, ABDOMEN, AND PELVIS WITH CONTRAST TECHNIQUE: Multidetector CT imaging of the chest,  abdomen and pelvis was performed following the standard protocol during bolus administration of intravenous contrast. CONTRAST:  117m OMNIPAQUE IOHEXOL 300 MG/ML  SOLN COMPARISON:  Multiple priors most recent 05/03/2019 FINDINGS: CT CHEST FINDINGS Cardiovascular: Calcified atheromatous plaque of the thoracic aorta. No aneurysmal dilation. Central pulmonary vasculature is normal by venous phase assessment. Heart size is normal with three-vessel coronary artery calcification. No pericardial effusion. Mediastinum/Nodes: Esophagus grossly normal. Thoracic inlet structures are normal. No axillary lymphadenopathy. No mediastinal or hilar lymphadenopathy. Lungs/Pleura: Subsolid nodule in the RIGHT upper lobe is increasing in size This measures 10 x 11 mm (image 33, series 3) previously approximately 6 x 6 mm. Upon comparison with the study of October 30, 2018 this measured approximately 5 mm. More dense than on previous  imaging centrally developing area of more solid-appearing density measuring approximately 4 mm. Subpleural reticulation, paraseptal emphysema and pleural and parenchymal scarring in the chest is similar to the previous study. Chain suture in the LEFT lower lobe is similar. Some pleural thickening along the dependent RIGHT chest is new to. Musculoskeletal: No chest wall lesion. CT ABDOMEN PELVIS FINDINGS Hepatobiliary: Hepatic steatosis, mildly nodular hepatic contours. Accessory or replaced RIGHT select RIGHT LEFT hepatic artery arises from LEFT gastric artery. No focal hepatic lesion. No pericholecystic stranding. No ductal dilation. Pancreas: Pancreas without focal lesion or peripancreatic inflammation. Spleen: Spleen normal size. Adrenals/Urinary Tract: Adrenal glands with stable mild nodular thickening of the LEFT adrenal gland measuring approximately 9 mm unchanged since 2019. RIGHT renal cyst arising posteriorly from the RIGHT kidney also small cyst in the upper pole. No hydronephrosis.  Stomach/Bowel: Small hiatal hernia. Signs of APR. Small-bowel loops descent into the pelvis, some tethering of small bowel loops suggested to the anterior sacrum at the sacral promontory is unchanged and not associated with bowel obstruction. The appendix is normal. Vascular/Lymphatic: Calcific and noncalcific atheromatous plaque of the abdominal aorta. No aneurysm. No adenopathy. No pelvic adenopathy. Reproductive: Not well assessed. Other: Parastomal herniation of fat. This is about the LEFT lower quadrant. Small amount of fluid and or stranding within the peristomal hernia. Mild skin thickening over the site of the ostomy. Musculoskeletal: Spinal degenerative changes. No acute or destructive bone process. IMPRESSION: 1. Subsolid nodule in the RIGHT upper lobe is increasing in size. This measures 10 x 11 mm previously approximately 6 x 6 mm. More dense than on previous imaging centrally developing area of more solid-appearing density measuring approximately 4 mm. Findings raise the question of developing bronchogenic neoplasm. The appearance would not suggest metastatic disease from colorectal primary. Three to six-month follow-up chest CT is suggested with continued follow-up on subsequent imaging studies. 2. Some pleural thickening along the dependent RIGHT chest is new to the previous study. Given subpleural reticulation in the chest question of background interstitial lung disease superimposed on emphysema is considered. Correlation with any respiratory symptoms and upon follow-up for the above finding could consider high-resolution chest CT. 3. Hepatic steatosis, mildly nodular hepatic contours. Correlate with any clinical or laboratory evidence of liver disease. 4. Parastomal herniation of fat about the LEFT lower quadrant. Small amount of fluid and or stranding within the peristomal hernia. Mild skin thickening over the site of the ostomy. Correlate with any signs of cellulitis or inflammation about the  patient's ostomy on exam. 5. Emphysema and aortic atherosclerosis. Aortic Atherosclerosis (ICD10-I70.0) and Emphysema (ICD10-J43.9). Electronically Signed   By: Zetta Bills M.D.   On: 05/04/2020 13:11   NM PET Image Initial (PI) Skull Base To Thigh  Result Date: 05/22/2020 CLINICAL DATA:  Subsequent treatment strategy for rectal cancer. Enlarging sub solid nodule in the right upper lobe. EXAM: NUCLEAR MEDICINE PET SKULL BASE TO THIGH TECHNIQUE: 10.8 mCi F-18 FDG was injected intravenously. Full-ring PET imaging was performed from the skull base to thigh after the radiotracer. CT data was obtained and used for attenuation correction and anatomic localization. Fasting blood glucose: 100 mg/dl COMPARISON:  Multiple exams, including CT scan 05/04/2020 and PET-CT from 01/26/2015 FINDINGS: Mediastinal blood pool activity: SUV max 2.7 Liver activity: SUV max N/A NECK: No significant abnormal hypermetabolic activity in this region. Incidental CT findings: Bilateral common carotid atherosclerotic calcifications. CHEST: A part solid nodule in the apical segment right upper lobe has a maximum SUV of 1.6. No hypermetabolic adenopathy in  the chest. Incidental CT findings: Coronary, aortic arch, and branch vessel atherosclerotic vascular disease. Small type 1 hiatal hernia. ABDOMEN/PELVIS: No significant abnormal hypermetabolic activity in this region. Incidental CT findings: Partially duplicated left renal collecting system. Stable postoperative findings including APR and left lower quadrant colostomy. Photopenic right mid kidney cyst posteriorly. SKELETON: No significant abnormal hypermetabolic activity in this region. Incidental CT findings: Old healed left sixth rib fracture. Mild thoracic and lower cervical spondylosis. IMPRESSION: 1. The part solid nodule in the right upper lobe has a maximum SUV of 1.6. No hypermetabolic adenopathy PICC given the slow enlargement, low-grade adenocarcinoma is still a serious  consideration. 2. Other imaging findings of potential clinical significance: Aortic Atherosclerosis (ICD10-I70.0). Coronary atherosclerosis. Small type 1 hiatal hernia. Partially duplicated left renal collecting system. Stable postoperative findings in the colon. Electronically Signed   By: Van Clines M.D.   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   PLAN:    1. 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 K-ras mutation positive:  Patient had completed his adjuvant chemotherapy on December 09, 2014.  CT scan results from May 04, 2020 reviewed independently and reported as above with no obvious evidence of recurrent or progressive disease, but right upper lobe lesion has seemed to increase in size.  CEA continues to be within normal limits at 2.8.  Return to clinic in 6 months for routine evaluation. 2. Peripheral neuropathy: Worse.  Patient no longer takes any medications that were recommended or prescribed.  Have recommended home health and physical therapy to which patient agreed. 3. Rectal Pain/Pressure: Patient does not complain of this today.  Likely secondary to surgery. 4. Elevated MCV: Chronic and unchanged. Possibly secondary to history of chemotherapy. 5.  Pulmonary nodule: CT scan results as above.  PET scan results from May 22, 2020 reviewed independently and report as above with minimal hypermetabolism in right upper lobe lesion.  No intervention is needed at this time.  Repeat CT of the chest in 6 months to assess for interval change.   I spent a total of 20 minutes reviewing chart data, face-to-face evaluation with the patient, counseling and coordination of care as detailed above.    Patient expressed understanding and was in agreement with this plan. He also understands that He can call clinic at any time with any questions, concerns, or complaints.    Lloyd Huger,  MD   05/22/2020 1:44 PM

## 2020-05-21 ENCOUNTER — Ambulatory Visit
Admission: RE | Admit: 2020-05-21 | Discharge: 2020-05-21 | Disposition: A | Discharge status: Home / Self Care | Payer: Medicare HMO | Source: Ambulatory Visit | Attending: Oncology | Admitting: Oncology

## 2020-05-21 ENCOUNTER — Other Ambulatory Visit: Payer: Self-pay

## 2020-05-21 DIAGNOSIS — R911 Solitary pulmonary nodule: Secondary | ICD-10-CM | POA: Insufficient documentation

## 2020-05-21 DIAGNOSIS — I2510 Atherosclerotic heart disease of native coronary artery without angina pectoris: Secondary | ICD-10-CM | POA: Insufficient documentation

## 2020-05-21 DIAGNOSIS — K449 Diaphragmatic hernia without obstruction or gangrene: Secondary | ICD-10-CM | POA: Insufficient documentation

## 2020-05-21 DIAGNOSIS — I700 Atherosclerosis of aorta: Secondary | ICD-10-CM | POA: Diagnosis not present

## 2020-05-21 DIAGNOSIS — C20 Malignant neoplasm of rectum: Secondary | ICD-10-CM | POA: Insufficient documentation

## 2020-05-21 LAB — GLUCOSE, CAPILLARY: Glucose-Capillary: 100 mg/dL — ABNORMAL HIGH (ref 70–99)

## 2020-05-21 MED ORDER — FLUDEOXYGLUCOSE F - 18 (FDG) INJECTION
10.7800 | Freq: Once | INTRAVENOUS | Status: AC | PRN
  Administered 2020-05-21: 10.78 via INTRAVENOUS

## 2020-05-22 ENCOUNTER — Inpatient Hospital Stay: Payer: Medicare HMO | Attending: Oncology | Admitting: Oncology

## 2020-05-22 VITALS — BP 163/95 | HR 82 | Temp 97.1°F | Wt 196.9 lb

## 2020-05-22 DIAGNOSIS — Z79899 Other long term (current) drug therapy: Secondary | ICD-10-CM | POA: Diagnosis not present

## 2020-05-22 DIAGNOSIS — K449 Diaphragmatic hernia without obstruction or gangrene: Secondary | ICD-10-CM | POA: Diagnosis not present

## 2020-05-22 DIAGNOSIS — Z800 Family history of malignant neoplasm of digestive organs: Secondary | ICD-10-CM | POA: Diagnosis not present

## 2020-05-22 DIAGNOSIS — F17210 Nicotine dependence, cigarettes, uncomplicated: Secondary | ICD-10-CM | POA: Diagnosis not present

## 2020-05-22 DIAGNOSIS — K6289 Other specified diseases of anus and rectum: Secondary | ICD-10-CM | POA: Insufficient documentation

## 2020-05-22 DIAGNOSIS — G629 Polyneuropathy, unspecified: Secondary | ICD-10-CM | POA: Diagnosis not present

## 2020-05-22 DIAGNOSIS — R5383 Other fatigue: Secondary | ICD-10-CM | POA: Insufficient documentation

## 2020-05-22 DIAGNOSIS — Z7982 Long term (current) use of aspirin: Secondary | ICD-10-CM | POA: Diagnosis not present

## 2020-05-22 DIAGNOSIS — C20 Malignant neoplasm of rectum: Secondary | ICD-10-CM | POA: Diagnosis not present

## 2020-05-22 DIAGNOSIS — K760 Fatty (change of) liver, not elsewhere classified: Secondary | ICD-10-CM | POA: Insufficient documentation

## 2020-05-22 DIAGNOSIS — R531 Weakness: Secondary | ICD-10-CM | POA: Insufficient documentation

## 2020-05-22 DIAGNOSIS — C7802 Secondary malignant neoplasm of left lung: Secondary | ICD-10-CM | POA: Insufficient documentation

## 2020-05-22 DIAGNOSIS — I10 Essential (primary) hypertension: Secondary | ICD-10-CM | POA: Diagnosis not present

## 2020-05-22 DIAGNOSIS — Z803 Family history of malignant neoplasm of breast: Secondary | ICD-10-CM | POA: Insufficient documentation

## 2020-06-24 ENCOUNTER — Other Ambulatory Visit: Payer: Self-pay

## 2020-06-24 ENCOUNTER — Emergency Department
Admission: EM | Admit: 2020-06-24 | Discharge: 2020-06-24 | Disposition: A | Discharge status: Home / Self Care | Payer: Medicare HMO | Attending: Emergency Medicine | Admitting: Emergency Medicine

## 2020-06-24 ENCOUNTER — Emergency Department: Payer: Medicare HMO

## 2020-06-24 DIAGNOSIS — Y93E1 Activity, personal bathing and showering: Secondary | ICD-10-CM | POA: Diagnosis not present

## 2020-06-24 DIAGNOSIS — I10 Essential (primary) hypertension: Secondary | ICD-10-CM | POA: Diagnosis not present

## 2020-06-24 DIAGNOSIS — Y998 Other external cause status: Secondary | ICD-10-CM | POA: Insufficient documentation

## 2020-06-24 DIAGNOSIS — F17210 Nicotine dependence, cigarettes, uncomplicated: Secondary | ICD-10-CM | POA: Diagnosis not present

## 2020-06-24 DIAGNOSIS — M25551 Pain in right hip: Secondary | ICD-10-CM

## 2020-06-24 DIAGNOSIS — W01198A Fall on same level from slipping, tripping and stumbling with subsequent striking against other object, initial encounter: Secondary | ICD-10-CM | POA: Diagnosis not present

## 2020-06-24 DIAGNOSIS — Y92002 Bathroom of unspecified non-institutional (private) residence single-family (private) house as the place of occurrence of the external cause: Secondary | ICD-10-CM | POA: Diagnosis not present

## 2020-06-24 DIAGNOSIS — Z7982 Long term (current) use of aspirin: Secondary | ICD-10-CM | POA: Insufficient documentation

## 2020-06-24 DIAGNOSIS — S32010A Wedge compression fracture of first lumbar vertebra, initial encounter for closed fracture: Secondary | ICD-10-CM | POA: Insufficient documentation

## 2020-06-24 DIAGNOSIS — S32040A Wedge compression fracture of fourth lumbar vertebra, initial encounter for closed fracture: Secondary | ICD-10-CM

## 2020-06-24 DIAGNOSIS — Z85048 Personal history of other malignant neoplasm of rectum, rectosigmoid junction, and anus: Secondary | ICD-10-CM | POA: Diagnosis not present

## 2020-06-24 DIAGNOSIS — W19XXXA Unspecified fall, initial encounter: Secondary | ICD-10-CM

## 2020-06-24 DIAGNOSIS — S3992XA Unspecified injury of lower back, initial encounter: Secondary | ICD-10-CM | POA: Diagnosis present

## 2020-06-24 MED ORDER — OXYCODONE-ACETAMINOPHEN 7.5-325 MG PO TABS: 1.0000 | ORAL_TABLET | Freq: Four times a day (QID) | ORAL | 0 refills | Status: DC | PRN

## 2020-06-24 NOTE — ED Triage Notes (Signed)
Pt fell trying to take shirt off. Pt states no dizziness or confusion before fall. Tripped over shower. Hit back and hip on side of sink. Did not hit head. No thinners. Pt AOx4.

## 2020-06-24 NOTE — ED Triage Notes (Signed)
First nurse note- slipped getting out of shower. Right back pain from hitting on sink. Alert and oriented.  Arrived via [facility].

## 2020-06-24 NOTE — ED Notes (Signed)
See triage note  Presents with right hip and lower back pain  States he fell yesterday  Lost his balance

## 2020-06-24 NOTE — Discharge Instructions (Addendum)
Please use the Percocet as prescribed as needed.  Please follow-up with your primary care regarding this fall.  In addition, please schedule an appointment with orthopedics regarding your new back compression fracture.  Return to the emergency department should you experience any headache, dizziness, blurred vision, or syncope.

## 2020-06-24 NOTE — ED Provider Notes (Signed)
[facility] Emergency Department Provider Note   ____________________________________________   First MD Initiated Contact with Patient 06/24/20 1307     (approximate)  I have reviewed the triage vital signs and the nursing notes.   HISTORY  Chief Complaint Fall    HPI Stephen [name] is a 74 y.o. male who presents to the emergency department as a result of a fall at home.  The patient states that he had previous unsteady gait, and normally uses a walker or holds onto objects at home for balance.  This morning, he was trying to change his shirt in the bathroom, when he let go of surrounding stabilizing structures and lost his balance.  He states he hit his low back as well as his right hip on the bathroom sink.  He denies hitting his head.  Denies loss of consciousness.  States that he was not dizzy or having blurred vision before or after the fall.  He describes the most pain in the right hip, described as sharp and tender to touch.  States the pain is worse when he tries to flex the hip forward.  He also has increased pain with ambulation.  He does describe mid to low back pain, that is not alleviated or aggravated by any factors.      Past Medical History:  Diagnosis Date  . Allergic rhinitis   . Blood clot in vein 2015   left leg   . History of anemia   . History of chicken pox   . HTN (hypertension)   . Neuropathy    arms and legs - S/P Cancer tx  . 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 Smoker     Patient Active Problem List   Diagnosis Date Noted  . Rectal cancer ([place]) 11/15/2013  . History of anemia   . HTN (hypertension)   . Smoker     Past Surgical History:  Procedure Laterality Date  . COLON RESECTION  2015  . COLONOSCOPY  2015  . EUS N/A 11/15/2013   Procedure: LOWER ENDOSCOPIC ULTRASOUND (EUS);  Surgeon: Milus Banister, MD;  Location: [facility];  Service: Endoscopy;  Laterality: N/A;  . LUNG  BIOPSY    . LUNG LOBECTOMY Left   . lung removed  2015   left lower   . PLANTAR FASCIA RELEASE Right 09/20/2017   Procedure: PLANTAR FASCIA [id number], excision plantar fibroma right foot;  Surgeon: Samara Deist, DPM;  Location: [facility];  Service: Podiatry;  Laterality: Right;  . TONSILLECTOMY AND ADENOIDECTOMY  Childhood  . VARICOSE VEIN SURGERY  2010    Prior to Admission medications   Medication Sig Start Date End Date Taking? Authorizing Provider  aspirin EC 81 MG tablet Take 81 mg by mouth daily.    [provider]  bisacodyl (DULCOLAX) 5 MG EC tablet Take 5 mg by mouth daily as needed for moderate constipation.    [provider]  esomeprazole (NEXIUM) 20 MG packet Take 20 mg by mouth daily before breakfast.    [provider]  folic acid (FOLVITE) 1 MG tablet Take 1 tablet by mouth 1 day or 1 dose. 04/23/19   [provider]  loratadine (CLARITIN) 10 MG tablet Take 10 mg by mouth daily as needed.    [provider]  olmesartan (BENICAR) 20 MG tablet Take 20 mg by mouth daily.    [provider]  oxyCODONE-acetaminophen (PERCOCET) 7.5-325 MG tablet Take  1 tablet by mouth every 6 (six) hours as needed for severe pain. 06/24/20   Sable Feil, PA-C  pyridOXINE (B-6) 50 MG tablet Take 1 tablet by mouth.    [provider]    Allergies No known allergies  Family History  Problem Relation Age of Onset  . Hypertension Mother   . Coronary artery disease Mother        angina  . Cancer Mother        breast  . Cancer Father 85       esophageal  . COPD Brother        emphysema  . Cancer Paternal Grandmother        colon  . Diabetes Neg Hx     Social History Social History   Tobacco Use  . Smoking status: Current Every Day Smoker    Packs/day: 0.50    Years: 15.00    Pack years: 7.50    Types: Cigarettes  . Smokeless tobacco: Never Used  Vaping Use  . Vaping Use: Never used  Substance Use Topics   . Alcohol use: Yes    Alcohol/week: 18.0 standard drinks    Types: 18 Shots of liquor per week    Comment: Regular 2-3 scotch/day  . Drug use: No    Review of Systems  Constitutional: No fever/chills Eyes: No visual changes. ENT: No sore throat. Cardiovascular: Denies chest pain. Respiratory: Denies shortness of breath. Gastrointestinal: No abdominal pain.  No nausea, no vomiting.  No diarrhea.  No constipation. Genitourinary: Negative for dysuria. Musculoskeletal: Positive for low back pain and right hip pain Skin: Negative for rash. Neurological: Negative for headaches, focal weakness or numbness.   ____________________________________________   PHYSICAL EXAM:  VITAL SIGNS: ED Triage Vitals  Enc Vitals Group     BP 06/24/20 1124 (!) 149/99     Pulse Rate 06/24/20 1124 97     Resp 06/24/20 1124 18     Temp 06/24/20 1124 98.2 F (36.8 C)     Temp Source 06/24/20 1124 Oral     SpO2 06/24/20 1124 98 %     Weight 06/24/20 1123 190 lb (86.2 kg)     Height 06/24/20 1123 5\' 7"  (1.702 m)     Head Circumference --      Peak Flow --      Pain Score 06/24/20 1129 8     Pain Loc --      Pain Edu? --      Excl. in [place]? --     Constitutional: Alert and oriented. Well appearing and in no acute distress. Eyes: Conjunctivae are normal. PERRL. EOMI. Head: Atraumatic. Nose: No congestion/rhinnorhea. Mouth/Throat: Mucous membranes are moist.  Oropharynx non-erythematous. Neck: No stridor.  No cervical spine tenderness to palpation.  No tenderness to palpation of the paraspinal musculature.  Full range of motion of the neck without pain reported. Cardiovascular: Normal rate, regular rhythm. Grossly normal heart sounds.  Good peripheral circulation. Respiratory: Normal respiratory effort.  No retractions. Lungs CTAB. Gastrointestinal: Soft and nontender. No distention. No abdominal bruits. No CVA tenderness. Musculoskeletal: There is 1+ edema to the bilateral lower extremities.   There is tenderness to palpation of the lateral aspect of the right hip.  He has pain in the mid back with range of motion of the hip.  Range of motion of the hip does not reproduce pain in the hip.  He has full range of motion of the right knee.  His lumbar spine is  not tender to palpate, but is reported as painful with flexion and extension. Neurologic:  Normal speech and language. No gross focal neurologic deficits are appreciated. No gait instability. Skin:  Skin is warm, dry and intact. No rash noted. Psychiatric: Mood and affect are normal. Speech and behavior are normal.  ____________________________________________   LABS (all labs ordered are listed, but only abnormal results are displayed)  Labs Reviewed - No data to display ____________________________________________  EKG   ____________________________________________  RADIOLOGY  ED MD interpretation:    Official radiology report(s): DG Lumbar Spine 2-3 Views  Result Date: 06/24/2020 CLINICAL DATA:  Fall, low back pain EXAM: LUMBAR SPINE - 2-3 VIEW COMPARISON:  Correlation made with CT abdomen 05/04/2020 FINDINGS: There is new moderate compression deformity of L4. Vertebral body heights otherwise appear similar to prior CT abdomen. There is mild multilevel loss of disc height with small endplate osteophytes. Lower lumbar facet hypertrophy is present. IMPRESSION: Moderate compression deformity of L4, new since 05/04/2020. Electronically Signed   By: Macy Mis M.D.   On: 06/24/2020 14:26   DG Hip Unilat  With Pelvis 2-3 Views Right  Result Date: 06/24/2020 CLINICAL DATA:  Fall, right hip pain EXAM: DG HIP (WITH OR WITHOUT PELVIS) 2-3V RIGHT COMPARISON:  None. FINDINGS: There is no acute fracture or dislocation. Mild degenerative changes are present. Vascular calcification is noted. IMPRESSION: No acute fracture. Electronically Signed   By: Macy Mis M.D.   On: 06/24/2020 12:47     ____________________________________________   PROCEDURES  Procedure(s) performed (including Critical Care):  Procedures   ____________________________________________   INITIAL IMPRESSION / ASSESSMENT AND PLAN / ED COURSE  As part of my medical decision making, I reviewed the following data within the [place] History obtained from family, Old chart reviewed, Radiograph reviewed, Notes from prior ED visits and [place] Controlled Substance Database        Farouk Vivero is a 74 year old male who comes to the emergency department today as a result of a fall.  Denies hitting the head, dizziness, change in vision, syncope.  Complaining mostly of right hip and low back pain.  The patient has a reassuring exam with only the lateral aspect of the right hip as tender to palpate.  He does have limited painful range of motion of the right hip and some painful range of motion with flexion of the lumbar spine.  X-rays reviewed today are read as normal of the hip, with an new compression fracture since June 2021 of the L4 vertebral body.  This compression fracture is described as moderate.  He does not have any appreciated neurological weakness.  At this time patient has reassuring vital signs and reassuring neurological exam.  Discussed extensively with the wife, who would keep an eye on him.  Discussed with the patient and his wife regarding the vertebral body fracture.  The patient will decrease his activity and avoid lifting bending twisting until follow-up with orthopedics.  The patient and his wife are advised to return should he experience any change in mental status, dizziness, syncope, or other concerning signs.  The patient was cautioned extensively on the use of narcotics given his previous balance issues and age.  A low dose, short course of Percocet was sent to his pharmacy for pain control until he sees orthopedics.  Kimi Kroft was evaluated in Emergency Department  on 06/24/2020 for the symptoms described in the history of present illness. He was evaluated in the context of the [place] COVID-19  pandemic, which necessitated consideration that the patient might be at risk for infection with the SARS-CoV-2 virus that causes COVID-19. Institutional protocols and algorithms that pertain to the evaluation of patients at risk for COVID-19 are in a state of rapid change based on information released by regulatory bodies including the CDC and federal and state organizations. These policies and algorithms were followed during the patient's care in the ED.      ____________________________________________   FINAL CLINICAL IMPRESSION(S) / ED DIAGNOSES  Final diagnoses:  Fall, initial encounter  Compression fracture of L4 vertebra, initial encounter ([place])  Right hip pain     ED Discharge Orders         Ordered    oxyCODONE-acetaminophen (PERCOCET) 7.5-325 MG tablet  Every 6 hours PRN     Discontinue  Reprint     06/24/20 1438           Note:  This document was prepared using Dragon voice recognition software and may include unintentional dictation errors.    Marlana Salvage, PA 06/24/20 1638    Duffy Bruce, MD 07/02/20 1200

## 2020-06-25 ENCOUNTER — Telehealth: Payer: Self-pay | Admitting: *Deleted

## 2020-06-25 NOTE — Telephone Encounter (Signed)
Clair Gulling PT with [facility] called to report that patient fell yesterday and was taken to ER and has a fracture of L4. He asked for 1 more additional visit and for SW to help coordinate community resources for patient which I approved. He reports that patient is having pain rated 5/10 and is actually takiing Oxycodone given to him by the Er. He also reports that patient has follow up appointment with Ortho for his fracture.

## 2020-07-06 ENCOUNTER — Telehealth: Payer: Self-pay | Admitting: *Deleted

## 2020-07-06 NOTE — Telephone Encounter (Signed)
Call returned to Clair Gulling, he states PCP is aware and that patient has an appointment with PCP tomorrow

## 2020-07-06 NOTE — Telephone Encounter (Signed)
This sounds unrelated to his distant h/o of rectal cancer.  PCP or orthopedics should be following,

## 2020-07-06 NOTE — Telephone Encounter (Signed)
Jim with [facility] called to report that PT Services are being discontinued due to patient request. He also reports that patient fell 2 weeks ago and fractured his spine and that he is now rarely even standing. Also reports that patient has not had bowel movement is 15 days and is not eating. He reports patient has poor pain control as well. Please advise. His next appointment with Dr Grayland Ormond is not until January

## 2020-11-16 ENCOUNTER — Ambulatory Visit
Admission: RE | Admit: 2020-11-16 | Discharge: 2020-11-16 | Disposition: A | Discharge status: Home / Self Care | Payer: Medicare HMO | Source: Ambulatory Visit | Attending: Oncology | Admitting: Oncology

## 2020-11-16 ENCOUNTER — Other Ambulatory Visit: Payer: Self-pay

## 2020-11-16 ENCOUNTER — Inpatient Hospital Stay: Payer: Medicare HMO | Attending: Oncology

## 2020-11-16 DIAGNOSIS — C20 Malignant neoplasm of rectum: Secondary | ICD-10-CM | POA: Insufficient documentation

## 2020-11-16 DIAGNOSIS — C7802 Secondary malignant neoplasm of left lung: Secondary | ICD-10-CM | POA: Diagnosis present

## 2020-11-16 LAB — COMPREHENSIVE METABOLIC PANEL
ALT: 13 U/L (ref 0–44)
AST: 31 U/L (ref 15–41)
Albumin: 3.1 g/dL — ABNORMAL LOW (ref 3.5–5.0)
Alkaline Phosphatase: 104 U/L (ref 38–126)
Anion gap: 12 (ref 5–15)
BUN: 7 mg/dL — ABNORMAL LOW (ref 8–23)
CO2: 23 mmol/L (ref 22–32)
Calcium: 8.8 mg/dL — ABNORMAL LOW (ref 8.9–10.3)
Chloride: 99 mmol/L (ref 98–111)
Creatinine, Ser: 0.9 mg/dL (ref 0.61–1.24)
GFR, Estimated: >60 mL/min (ref >60)
Glucose, Bld: 112 mg/dL — ABNORMAL HIGH (ref 70–99)
Potassium: 3.7 mmol/L (ref 3.5–5.1)
Sodium: 134 mmol/L — ABNORMAL LOW (ref 135–145)
Total Bilirubin: 1 mg/dL (ref 0.3–1.2)
Total Protein: 6.9 g/dL (ref 6.5–8.1)

## 2020-11-16 LAB — CBC WITH DIFFERENTIAL/PLATELET
Abs Immature Granulocytes: 0.04 10*3/uL (ref 0.00–0.07)
Basophils Absolute: 0.2 10*3/uL — ABNORMAL HIGH (ref 0.0–0.1)
Basophils Relative: 2 %
Eosinophils Absolute: 0.4 10*3/uL (ref 0.0–0.5)
Eosinophils Relative: 4 %
HCT: 37.5 % — ABNORMAL LOW (ref 39.0–52.0)
Hemoglobin: 13.7 g/dL (ref 13.0–17.0)
Immature Granulocytes: 1 %
Lymphocytes Relative: 17 %
Lymphs Abs: 1.5 10*3/uL (ref 0.7–4.0)
MCH: 37.8 pg — ABNORMAL HIGH (ref 26.0–34.0)
MCHC: 36.5 g/dL — ABNORMAL HIGH (ref 30.0–36.0)
MCV: 103.6 fL — ABNORMAL HIGH (ref 80.0–100.0)
Monocytes Absolute: 0.8 10*3/uL (ref 0.1–1.0)
Monocytes Relative: 10 %
Neutro Abs: 5.7 10*3/uL (ref 1.7–7.7)
Neutrophils Relative %: 66 %
Platelets: 264 10*3/uL (ref 150–400)
RBC: 3.62 MIL/uL — ABNORMAL LOW (ref 4.22–5.81)
RDW: 15.3 % (ref 11.5–15.5)
WBC: 8.5 10*3/uL (ref 4.0–10.5)
nRBC: 0 % (ref 0.0–0.2)

## 2020-11-16 MED ORDER — IOHEXOL 300 MG/ML  SOLN
75.0000 mL | Freq: Once | INTRAMUSCULAR | Status: AC | PRN
  Administered 2020-11-16: 75 mL via INTRAVENOUS

## 2020-11-17 LAB — CEA

## 2020-11-21 NOTE — Progress Notes (Signed)
[place]  [phone] Fax:([phone]) [phone]  ID: Stephen [name] OB: 1946-04-29  MR#: [id number]  RJJ#:[id number]  Patient Care Team: [name], MD as PCP - General (Family Medicine) Christene Lye, MD as Consulting Physician (General Surgery) Clemmie Krill [name], MD as Referring Physician (Family Medicine) Lloyd Huger, MD as Consulting Physician (Hematology and Oncology)  I connected with Stephen [name] on 11/25/20 at 10:45 AM EST by telephone visit and verified that I am speaking with the correct person using two identifiers.   I discussed the limitations, risks, security and privacy concerns of performing an evaluation and management service by telemedicine and the availability of in-person appointments. I also discussed with the patient that there may be a patient responsible charge related to this service. The patient expressed understanding and agreed to proceed.   Other persons participating in the visit and their role in the encounter: Patient, MD.  Patients location: Home. Providers location: Clinic.  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 K-ras mutation positive.  INTERVAL HISTORY: Attempts to reach patient via video were unsuccessful and visit was ultimately completed over telephone.  He continues to have neuropathy and weakness and fatigue. He has no other neurologic complaints.  He denies any recent fevers or illnesses.  He has a good appetite and denies weight loss.  He denies any chest pain, shortness of breath, cough, or hemoptysis.  He denies any nausea, vomiting, constipation, or diarrhea. He has no melena or hematochezia. 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  Patient offers no further specific complaints today.  REVIEW OF SYSTEMS:   Review of Systems  Constitutional: Positive for malaise/fatigue. Negative for fever and weight loss.  Respiratory: Negative.  Negative for cough,  hemoptysis and shortness of breath.   Cardiovascular: Negative.  Negative for chest pain and leg swelling.  Gastrointestinal: Negative for abdominal pain, blood in stool, melena, nausea and vomiting.  Genitourinary: Negative.  Negative for dysuria.  Musculoskeletal: Negative.  Negative for back pain.  Skin: Negative.  Negative for rash.  Neurological: Positive for sensory change and weakness. Negative for dizziness, focal weakness and headaches.  Psychiatric/Behavioral: Negative.  The patient is not nervous/anxious.    As per HPI. Otherwise, a complete review of systems is negative.   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 [facility]) 09-2013   surgery and chemo   Smoker     PAST SURGICAL HISTORY: Past Surgical History:  Procedure Laterality Date   COLON RESECTION  2015   COLONOSCOPY  2015   EUS N/A 11/15/2013   Procedure: LOWER ENDOSCOPIC ULTRASOUND (EUS);  Surgeon: Milus Banister, MD;  Location: [facility];  Service: Endoscopy;  Laterality: N/A;   LUNG BIOPSY     LUNG LOBECTOMY Left    lung removed  2015   left lower    PLANTAR FASCIA RELEASE Right 09/20/2017   Procedure: PLANTAR FASCIA [id number], excision plantar fibroma right foot;  Surgeon: Samara Deist, DPM;  Location: [facility];  Service: Podiatry;  Laterality: Right;   TONSILLECTOMY AND ADENOIDECTOMY  Childhood   VARICOSE VEIN SURGERY  2010    FAMILY HISTORY Family History  Problem  Relation Age of Onset   Hypertension Mother    Coronary artery disease Mother        angina   Cancer Mother        breast   Cancer Father 55       esophageal   COPD Brother        emphysema   Cancer Paternal Grandmother        colon   Diabetes Neg Hx         ADVANCED DIRECTIVES:    HEALTH MAINTENANCE: Social History   Tobacco Use   Smoking status: Current Every Day Smoker    Packs/day: 0.50    Years: 15.00    Pack years: 7.50    Types: Cigarettes   Smokeless tobacco: Never Used  Scientific laboratory technician Use: Never used  Substance Use Topics   Alcohol use: Yes    Alcohol/week: 18.0 standard drinks    Types: 18 Shots of liquor per week    Comment: Regular 2-3 scotch/day   Drug use: No     Allergies  Allergen Reactions   No Known Allergies     Current Outpatient Medications  Medication Sig Dispense Refill   aspirin EC 81 MG tablet Take 81 mg by mouth daily.     bisacodyl (DULCOLAX) 5 MG EC tablet Take 5 mg by mouth daily as needed for moderate constipation.     esomeprazole (NEXIUM) 20 MG packet Take 20 mg by mouth daily before breakfast.     folic acid (FOLVITE) 1 MG tablet Take 1 tablet by mouth 1 day or 1 dose.     loratadine (CLARITIN) 10 MG tablet Take 10 mg by mouth daily as needed.     olmesartan (BENICAR) 20 MG tablet Take 20 mg by mouth daily.     oxyCODONE-acetaminophen (PERCOCET) 7.5-325 MG tablet Take 1 tablet by mouth every 6 (six) hours as needed for severe pain. 12 tablet 0   pyridOXINE (B-6) 50 MG tablet Take 1 tablet by mouth.     No current facility-administered medications for this visit.    OBJECTIVE: There were no vitals filed for this visit.   There is no height or weight on file to calculate BMI.    ECOG FS:0 - Asymptomatic  LAB RESULTS:  Lab Results  Component Value Date   NA 134 (L) 11/16/2020   K 3.7 11/16/2020   CL 99 11/16/2020   CO2 23 11/16/2020   GLUCOSE 112 (H) 11/16/2020   BUN 7 (L) 11/16/2020   CREATININE 0.90 11/16/2020   CALCIUM 8.8 (L) 11/16/2020   PROT 6.9 11/16/2020   ALBUMIN 3.1 (L) 11/16/2020   AST 31 11/16/2020   ALT 13 11/16/2020   ALKPHOS 104 11/16/2020   BILITOT 1.0 11/16/2020   GFRNONAA >60 11/16/2020   GFRAA >60 05/04/2020    Lab Results   Component Value Date   WBC 8.5 11/16/2020   NEUTROABS 5.7 11/16/2020   HGB 13.7 11/16/2020   HCT 37.5 (L) 11/16/2020   MCV 103.6 (H) 11/16/2020   PLT 264 11/16/2020    STUDIES: CT Chest W Contrast  Result Date: 11/16/2020 CLINICAL DATA:  Stage IV rectal cancer metastatic to left lung. Occasional shortness of breath. EXAM: CT CHEST WITH CONTRAST TECHNIQUE: Multidetector CT imaging of the chest was performed during intravenous contrast administration. CONTRAST:  37mL OMNIPAQUE IOHEXOL 300 MG/ML  SOLN COMPARISON:  PET 05/21/2020 and CT chest 05/04/2020, 10/30/2018 and 10/18/2016. FINDINGS: Cardiovascular: Atherosclerotic calcification of the aorta. Luminal narrowing  at the origin of the left subclavian artery. Coronary artery calcification. Heart size normal. No pericardial effusion. Mediastinum/Nodes: Mediastinal lymph nodes are not enlarged by CT size criteria. No hilar or axillary adenopathy. Esophagus is unremarkable. Lungs/Pleura: Ground-glass nodule in the apical segment right upper lobe measures 1.2 x 1.3 cm (3/27) and is stable from 05/04/2020 but has enlarged from 10/30/2018, at which time it measured 5 mm. Lesion is new from 10/18/2016. Centrilobular and paraseptal emphysema. Superimposed areas of predominantly peripheral ground-glass, similar. Postoperative scarring in the left lower lobe. No pleural fluid. Airway is unremarkable. Upper Abdomen: Liver is mildly heterogeneous in attenuation and the margin is slightly irregular. Visualized portions of the liver and gallbladder are otherwise unremarkable. Nodular thickening of the adrenal glands bilaterally. Low-attenuation lesions in the kidneys measure up to 2.4 cm on the right, incompletely imaged. Visualized portions of the kidneys, spleen, pancreas, stomach and bowel are otherwise unremarkable. Musculoskeletal: Degenerative changes in the spine. Old rib fractures. No worrisome lytic or sclerotic lesions. Mild compression of the L1 superior  endplate, unchanged. IMPRESSION: 1. Ground-glass right upper lobe nodule, stable from 05/04/2020 but enlarged from 10/30/2018 and new from 10/18/2016. Lesion remains suspicious for low-grade adenocarcinoma. 2. 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 3. Liver appears steatotic and cirrhotic. 4. Aortic atherosclerosis (ICD10-I70.0). Associated luminal narrowing at the origin of the left subclavian artery. Coronary artery calcification. 5.  Emphysema (ICD10-J43.9). Electronically Signed   By: Lorin Picket M.D.   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   PLAN:    1. 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 K-ras mutation positive:  Patient had completed his adjuvant chemotherapy on December 09, 2014.  CT scan results from May 04, 2020 reviewed independently with no obvious evidence of recurrent or progressive disease.  CEA continues to be within normal limits.  2. Peripheral neuropathy: Chronic and unchanged.  Patient no longer takes any medications that were recommended or prescribed.  Previously recommended home health physical therapy. 3. Rectal Pain/Pressure: Patient does not complain of this today.  Likely secondary to surgery. 4. Elevated MCV: Chronic and unchanged.  Possibly secondary to history of chemotherapy. 5.  Right upper lobe pulmonary nodule: CT scan results from November 16, 2020 reviewed independently and reported as above.  Nodule measures 1.2 x 1.3 cm and is unchanged from June 2021, but has increased in size slowly since December 2019 and is new since December 2017. PET scan results from May 22, 2020 reviewed independently with minimal hypermetabolism.  Suspect this is a low-grade lung adenocarcinoma and not a metastatic lesion.  No intervention is needed at this  time, but patient may require treatment, possibly XRT, in the future.  Return to clinic in 6 months with repeat imaging and further evaluation.  I provided 20 minutes of non face-to-face telephone visit time during this encounter which included chart review, counseling, and coordination of care as documented above.   Patient expressed understanding and was in agreement with this plan. He also understands that He can call clinic at any time with any questions, concerns, or complaints.    Lloyd Huger, MD   11/25/2020 6:59 AM

## 2020-11-23 ENCOUNTER — Ambulatory Visit: Payer: Medicare HMO | Admitting: Oncology

## 2020-11-24 ENCOUNTER — Encounter: Payer: Self-pay | Admitting: Oncology

## 2020-11-24 ENCOUNTER — Inpatient Hospital Stay (HOSPITAL_BASED_OUTPATIENT_CLINIC_OR_DEPARTMENT_OTHER): Payer: Medicare HMO | Admitting: Oncology

## 2020-11-24 DIAGNOSIS — C20 Malignant neoplasm of rectum: Secondary | ICD-10-CM | POA: Diagnosis not present

## 2020-11-24 NOTE — Progress Notes (Signed)
Patient assessed for video visit. Waiting for results, no complaints today.

## 2021-02-01 ENCOUNTER — Ambulatory Visit: Payer: Medicare HMO | Admitting: Cardiology

## 2021-02-02 ENCOUNTER — Encounter: Payer: Self-pay | Admitting: Cardiology

## 2021-02-19 ENCOUNTER — Other Ambulatory Visit: Payer: Self-pay

## 2021-02-19 ENCOUNTER — Inpatient Hospital Stay
Admission: EM | Admit: 2021-02-19 | Discharge: 2021-02-24 | DRG: 871 | Disposition: A | Discharge status: Home Health | Payer: Medicare HMO | Attending: Internal Medicine | Admitting: Internal Medicine

## 2021-02-19 ENCOUNTER — Emergency Department: Payer: Medicare HMO

## 2021-02-19 ENCOUNTER — Inpatient Hospital Stay: Payer: Medicare HMO

## 2021-02-19 DIAGNOSIS — R5381 Other malaise: Secondary | ICD-10-CM | POA: Diagnosis present

## 2021-02-19 DIAGNOSIS — F10239 Alcohol dependence with withdrawal, unspecified: Secondary | ICD-10-CM | POA: Diagnosis not present

## 2021-02-19 DIAGNOSIS — Z85118 Personal history of other malignant neoplasm of bronchus and lung: Secondary | ICD-10-CM

## 2021-02-19 DIAGNOSIS — M4856XA Collapsed vertebra, not elsewhere classified, lumbar region, initial encounter for fracture: Secondary | ICD-10-CM | POA: Diagnosis present

## 2021-02-19 DIAGNOSIS — G629 Polyneuropathy, unspecified: Secondary | ICD-10-CM | POA: Diagnosis present

## 2021-02-19 DIAGNOSIS — B952 Enterococcus as the cause of diseases classified elsewhere: Secondary | ICD-10-CM | POA: Diagnosis present

## 2021-02-19 DIAGNOSIS — N3000 Acute cystitis without hematuria: Secondary | ICD-10-CM | POA: Diagnosis not present

## 2021-02-19 DIAGNOSIS — N390 Urinary tract infection, site not specified: Secondary | ICD-10-CM | POA: Diagnosis present

## 2021-02-19 DIAGNOSIS — T17800S Unspecified foreign body in other parts of respiratory tract causing asphyxiation, sequela: Secondary | ICD-10-CM | POA: Insufficient documentation

## 2021-02-19 DIAGNOSIS — Z9049 Acquired absence of other specified parts of digestive tract: Secondary | ICD-10-CM

## 2021-02-19 DIAGNOSIS — A419 Sepsis, unspecified organism: Secondary | ICD-10-CM | POA: Diagnosis present

## 2021-02-19 DIAGNOSIS — E440 Moderate protein-calorie malnutrition: Secondary | ICD-10-CM | POA: Diagnosis present

## 2021-02-19 DIAGNOSIS — E876 Hypokalemia: Secondary | ICD-10-CM | POA: Diagnosis present

## 2021-02-19 DIAGNOSIS — Z20822 Contact with and (suspected) exposure to covid-19: Secondary | ICD-10-CM | POA: Diagnosis present

## 2021-02-19 DIAGNOSIS — Z8249 Family history of ischemic heart disease and other diseases of the circulatory system: Secondary | ICD-10-CM

## 2021-02-19 DIAGNOSIS — Z902 Acquired absence of lung [part of]: Secondary | ICD-10-CM

## 2021-02-19 DIAGNOSIS — J690 Pneumonitis due to inhalation of food and vomit: Secondary | ICD-10-CM

## 2021-02-19 DIAGNOSIS — R600 Localized edema: Secondary | ICD-10-CM

## 2021-02-19 DIAGNOSIS — Z9221 Personal history of antineoplastic chemotherapy: Secondary | ICD-10-CM

## 2021-02-19 DIAGNOSIS — I10 Essential (primary) hypertension: Secondary | ICD-10-CM | POA: Diagnosis present

## 2021-02-19 DIAGNOSIS — F10231 Alcohol dependence with withdrawal delirium: Secondary | ICD-10-CM | POA: Diagnosis not present

## 2021-02-19 DIAGNOSIS — Z7289 Other problems related to lifestyle: Secondary | ICD-10-CM | POA: Diagnosis not present

## 2021-02-19 DIAGNOSIS — C20 Malignant neoplasm of rectum: Secondary | ICD-10-CM | POA: Diagnosis not present

## 2021-02-19 DIAGNOSIS — E512 Wernicke's encephalopathy: Secondary | ICD-10-CM | POA: Diagnosis not present

## 2021-02-19 DIAGNOSIS — J9601 Acute respiratory failure with hypoxia: Secondary | ICD-10-CM

## 2021-02-19 DIAGNOSIS — E872 Acidosis: Secondary | ICD-10-CM | POA: Diagnosis present

## 2021-02-19 DIAGNOSIS — Z85048 Personal history of other malignant neoplasm of rectum, rectosigmoid junction, and anus: Secondary | ICD-10-CM | POA: Diagnosis not present

## 2021-02-19 DIAGNOSIS — Z515 Encounter for palliative care: Secondary | ICD-10-CM | POA: Diagnosis not present

## 2021-02-19 DIAGNOSIS — F32A Depression, unspecified: Secondary | ICD-10-CM | POA: Diagnosis present

## 2021-02-19 DIAGNOSIS — G4733 Obstructive sleep apnea (adult) (pediatric): Secondary | ICD-10-CM | POA: Diagnosis present

## 2021-02-19 DIAGNOSIS — R194 Change in bowel habit: Secondary | ICD-10-CM | POA: Diagnosis not present

## 2021-02-19 DIAGNOSIS — Z79899 Other long term (current) drug therapy: Secondary | ICD-10-CM

## 2021-02-19 DIAGNOSIS — Z933 Colostomy status: Secondary | ICD-10-CM

## 2021-02-19 DIAGNOSIS — X58XXXA Exposure to other specified factors, initial encounter: Secondary | ICD-10-CM | POA: Diagnosis present

## 2021-02-19 DIAGNOSIS — F1090 Alcohol use, unspecified, uncomplicated: Secondary | ICD-10-CM

## 2021-02-19 DIAGNOSIS — R6520 Severe sepsis without septic shock: Secondary | ICD-10-CM | POA: Diagnosis present

## 2021-02-19 DIAGNOSIS — R1312 Dysphagia, oropharyngeal phase: Secondary | ICD-10-CM | POA: Diagnosis present

## 2021-02-19 DIAGNOSIS — R279 Unspecified lack of coordination: Secondary | ICD-10-CM

## 2021-02-19 DIAGNOSIS — F17210 Nicotine dependence, cigarettes, uncomplicated: Secondary | ICD-10-CM | POA: Diagnosis present

## 2021-02-19 DIAGNOSIS — Z7982 Long term (current) use of aspirin: Secondary | ICD-10-CM

## 2021-02-19 DIAGNOSIS — Z7189 Other specified counseling: Secondary | ICD-10-CM | POA: Diagnosis not present

## 2021-02-19 DIAGNOSIS — Z825 Family history of asthma and other chronic lower respiratory diseases: Secondary | ICD-10-CM

## 2021-02-19 DIAGNOSIS — R112 Nausea with vomiting, unspecified: Secondary | ICD-10-CM

## 2021-02-19 DIAGNOSIS — K56600 Partial intestinal obstruction, unspecified as to cause: Secondary | ICD-10-CM | POA: Diagnosis present

## 2021-02-19 DIAGNOSIS — Z800 Family history of malignant neoplasm of digestive organs: Secondary | ICD-10-CM

## 2021-02-19 DIAGNOSIS — Z789 Other specified health status: Secondary | ICD-10-CM

## 2021-02-19 DIAGNOSIS — K5900 Constipation, unspecified: Secondary | ICD-10-CM | POA: Diagnosis not present

## 2021-02-19 DIAGNOSIS — F10939 Alcohol use, unspecified with withdrawal, unspecified: Secondary | ICD-10-CM | POA: Diagnosis not present

## 2021-02-19 DIAGNOSIS — R1084 Generalized abdominal pain: Secondary | ICD-10-CM

## 2021-02-19 DIAGNOSIS — F17200 Nicotine dependence, unspecified, uncomplicated: Secondary | ICD-10-CM | POA: Diagnosis present

## 2021-02-19 DIAGNOSIS — Z6826 Body mass index (BMI) 26.0-26.9, adult: Secondary | ICD-10-CM | POA: Diagnosis not present

## 2021-02-19 DIAGNOSIS — R0902 Hypoxemia: Secondary | ICD-10-CM

## 2021-02-19 DIAGNOSIS — K219 Gastro-esophageal reflux disease without esophagitis: Secondary | ICD-10-CM | POA: Diagnosis present

## 2021-02-19 LAB — URINALYSIS, COMPLETE (UACMP) WITH MICROSCOPIC
Glucose, UA: NEGATIVE mg/dL
Hgb urine dipstick: NEGATIVE
Ketones, ur: NEGATIVE mg/dL
Nitrite: NEGATIVE
Protein, ur: 30 mg/dL — AB
Specific Gravity, Urine: 1.021 (ref 1.005–1.030)
Squamous Epithelial / HPF: NONE SEEN (ref 0–5)
WBC, UA: >50 WBC/hpf — ABNORMAL HIGH (ref 0–5)
pH: 6 (ref 5.0–8.0)

## 2021-02-19 LAB — COMPREHENSIVE METABOLIC PANEL
ALT: 21 U/L (ref 0–44)
AST: 56 U/L — ABNORMAL HIGH (ref 15–41)
Albumin: 3.4 g/dL — ABNORMAL LOW (ref 3.5–5.0)
Alkaline Phosphatase: 96 U/L (ref 38–126)
Anion gap: 13 (ref 5–15)
BUN: 13 mg/dL (ref 8–23)
CO2: 21 mmol/L — ABNORMAL LOW (ref 22–32)
Calcium: 9.7 mg/dL (ref 8.9–10.3)
Chloride: 98 mmol/L (ref 98–111)
Creatinine, Ser: 0.8 mg/dL (ref 0.61–1.24)
GFR, Estimated: >60 mL/min (ref >60)
Glucose, Bld: 146 mg/dL — ABNORMAL HIGH (ref 70–99)
Potassium: 4.5 mmol/L (ref 3.5–5.1)
Sodium: 132 mmol/L — ABNORMAL LOW (ref 135–145)
Total Bilirubin: 1.4 mg/dL — ABNORMAL HIGH (ref 0.3–1.2)
Total Protein: 7.8 g/dL (ref 6.5–8.1)

## 2021-02-19 LAB — CBC WITH DIFFERENTIAL/PLATELET
Abs Immature Granulocytes: 0.1 10*3/uL — ABNORMAL HIGH (ref 0.00–0.07)
Basophils Absolute: 0.1 10*3/uL (ref 0.0–0.1)
Basophils Relative: 0 %
Eosinophils Absolute: 0 10*3/uL (ref 0.0–0.5)
Eosinophils Relative: 0 %
HCT: 44 % (ref 39.0–52.0)
Hemoglobin: 15.4 g/dL (ref 13.0–17.0)
Immature Granulocytes: 1 %
Lymphocytes Relative: 3 %
Lymphs Abs: 0.6 10*3/uL — ABNORMAL LOW (ref 0.7–4.0)
MCH: 35.2 pg — ABNORMAL HIGH (ref 26.0–34.0)
MCHC: 35 g/dL (ref 30.0–36.0)
MCV: 100.5 fL — ABNORMAL HIGH (ref 80.0–100.0)
Monocytes Absolute: 0.8 10*3/uL (ref 0.1–1.0)
Monocytes Relative: 4 %
Neutro Abs: 20 10*3/uL — ABNORMAL HIGH (ref 1.7–7.7)
Neutrophils Relative %: 92 %
Platelets: 309 10*3/uL (ref 150–400)
RBC: 4.38 MIL/uL (ref 4.22–5.81)
RDW: 13.3 % (ref 11.5–15.5)
WBC: 21.6 10*3/uL — ABNORMAL HIGH (ref 4.0–10.5)
nRBC: 0 % (ref 0.0–0.2)

## 2021-02-19 LAB — RESP PANEL BY RT-PCR (FLU A&B, COVID) ARPGX2
Influenza A by PCR: NEGATIVE
Influenza B by PCR: NEGATIVE
SARS Coronavirus 2 by RT PCR: NEGATIVE

## 2021-02-19 LAB — BRAIN NATRIURETIC PEPTIDE: B Natriuretic Peptide: 244.2 pg/mL — ABNORMAL HIGH (ref 0.0–100.0)

## 2021-02-19 LAB — LIPASE, BLOOD: Lipase: 29 U/L (ref 11–51)

## 2021-02-19 LAB — LACTIC ACID, PLASMA
Lactic Acid, Venous: 3.1 mmol/L (ref 0.5–1.9)
Lactic Acid, Venous: 1.7 mmol/L (ref 0.5–1.9)

## 2021-02-19 LAB — TROPONIN I (HIGH SENSITIVITY): Troponin I (High Sensitivity): 9 ng/L (ref <18)

## 2021-02-19 LAB — PROCALCITONIN: Procalcitonin: 1.5 ng/mL

## 2021-02-19 MED ORDER — ACETAMINOPHEN 325 MG PO TABS
650.0000 mg | ORAL_TABLET | Freq: Four times a day (QID) | ORAL | Status: DC | PRN
  Administered 2021-02-19 – 2021-02-22 (×2): 650 mg via ORAL
  Filled 2021-02-19 (×2): qty 2

## 2021-02-19 MED ORDER — ONDANSETRON HCL 4 MG/2ML IJ SOLN
4.0000 mg | Freq: Three times a day (TID) | INTRAMUSCULAR | Status: DC | PRN
Start: 2021-02-19 — End: 2021-02-25
  Administered 2021-02-20: 4 mg via INTRAVENOUS
  Filled 2021-02-19: qty 2

## 2021-02-19 MED ORDER — PIPERACILLIN-TAZOBACTAM 3.375 G IVPB
3.3750 g | Freq: Three times a day (TID) | INTRAVENOUS | Status: DC
  Administered 2021-02-19 – 2021-02-22 (×9): 3.375 g via INTRAVENOUS
  Filled 2021-02-19 (×12): qty 50

## 2021-02-19 MED ORDER — SODIUM CHLORIDE 0.9 % IV BOLUS
1000.0000 mL | Freq: Once | INTRAVENOUS | Status: AC
  Administered 2021-02-19: 1000 mL via INTRAVENOUS

## 2021-02-19 MED ORDER — LORAZEPAM 2 MG/ML IJ SOLN
0.0000 mg | Freq: Four times a day (QID) | INTRAMUSCULAR | Status: DC
Start: 2021-02-19 — End: 2021-02-21
  Administered 2021-02-20: 3 mg via INTRAVENOUS
  Filled 2021-02-19: qty 2

## 2021-02-19 MED ORDER — PANTOPRAZOLE SODIUM 40 MG PO PACK
40.0000 mg | PACK | Freq: Every day | ORAL | Status: DC
  Administered 2021-02-19: 40 mg via ORAL
  Filled 2021-02-19 (×2): qty 20

## 2021-02-19 MED ORDER — SODIUM CHLORIDE 0.9 % IV BOLUS (SEPSIS)
1000.0000 mL | Freq: Once | INTRAVENOUS | Status: AC
  Administered 2021-02-19: 1000 mL via INTRAVENOUS

## 2021-02-19 MED ORDER — SODIUM CHLORIDE 0.9 % IV BOLUS (SEPSIS)
500.0000 mL | Freq: Once | INTRAVENOUS | Status: AC
  Administered 2021-02-19: 500 mL via INTRAVENOUS

## 2021-02-19 MED ORDER — LORAZEPAM 2 MG/ML IJ SOLN: 1.0000 mg | INTRAMUSCULAR | Status: DC | PRN

## 2021-02-19 MED ORDER — ACETAMINOPHEN 650 MG RE SUPP
650.0000 mg | Freq: Four times a day (QID) | RECTAL | Status: DC | PRN
  Filled 2021-02-19: qty 1

## 2021-02-19 MED ORDER — BISACODYL 5 MG PO TBEC
5.0000 mg | DELAYED_RELEASE_TABLET | Freq: Two times a day (BID) | ORAL | Status: DC
  Administered 2021-02-19 – 2021-02-24 (×5): 5 mg via ORAL
  Filled 2021-02-19 (×7): qty 1

## 2021-02-19 MED ORDER — LORAZEPAM 1 MG PO TABS
1.0000 mg | ORAL_TABLET | ORAL | Status: DC | PRN
Start: 2021-02-19 — End: 2021-02-22

## 2021-02-19 MED ORDER — ESOMEPRAZOLE MAGNESIUM 20 MG PO PACK: 20.0000 mg | PACK | Freq: Every day | ORAL | Status: DC

## 2021-02-19 MED ORDER — OXYCODONE-ACETAMINOPHEN 7.5-325 MG PO TABS
1.0000 | ORAL_TABLET | Freq: Four times a day (QID) | ORAL | Status: DC | PRN
  Administered 2021-02-19: 1 via ORAL
  Filled 2021-02-19: qty 1

## 2021-02-19 MED ORDER — LORATADINE 10 MG PO TABS: 10.0000 mg | ORAL_TABLET | Freq: Every day | ORAL | Status: DC | PRN

## 2021-02-19 MED ORDER — NICOTINE 21 MG/24HR TD PT24
21.0000 mg | MEDICATED_PATCH | Freq: Every day | TRANSDERMAL | Status: DC
  Administered 2021-02-19 – 2021-02-24 (×6): 21 mg via TRANSDERMAL
  Filled 2021-02-19 (×6): qty 1

## 2021-02-19 MED ORDER — PIPERACILLIN-TAZOBACTAM 3.375 G IVPB 30 MIN
3.3750 g | Freq: Once | INTRAVENOUS | Status: AC
  Administered 2021-02-19: 3.375 g via INTRAVENOUS
  Filled 2021-02-19: qty 50

## 2021-02-19 MED ORDER — HEPARIN SODIUM (PORCINE) 5000 UNIT/ML IJ SOLN
5000.0000 [IU] | Freq: Three times a day (TID) | INTRAMUSCULAR | Status: DC
  Administered 2021-02-19 – 2021-02-23 (×12): 5000 [IU] via SUBCUTANEOUS
  Filled 2021-02-19 (×13): qty 1

## 2021-02-19 MED ORDER — HYDRALAZINE HCL 20 MG/ML IJ SOLN: 5.0000 mg | INTRAMUSCULAR | Status: DC | PRN

## 2021-02-19 MED ORDER — ALBUTEROL SULFATE (2.5 MG/3ML) 0.083% IN NEBU: 2.5000 mg | INHALATION_SOLUTION | RESPIRATORY_TRACT | Status: DC | PRN

## 2021-02-19 MED ORDER — ASPIRIN EC 81 MG PO TBEC
81.0000 mg | DELAYED_RELEASE_TABLET | Freq: Every day | ORAL | Status: DC
  Administered 2021-02-19 – 2021-02-24 (×5): 81 mg via ORAL
  Filled 2021-02-19 (×5): qty 1

## 2021-02-19 MED ORDER — LORAZEPAM 2 MG/ML IJ SOLN: 0.0000 mg | Freq: Two times a day (BID) | INTRAMUSCULAR | Status: DC

## 2021-02-19 MED ORDER — THIAMINE HCL 100 MG/ML IJ SOLN
100.0000 mg | Freq: Every day | INTRAMUSCULAR | Status: DC
  Administered 2021-02-21: 100 mg via INTRAVENOUS
  Filled 2021-02-19: qty 2

## 2021-02-19 MED ORDER — ADULT MULTIVITAMIN W/MINERALS CH
1.0000 | ORAL_TABLET | Freq: Every day | ORAL | Status: DC
  Administered 2021-02-19 – 2021-02-24 (×5): 1 via ORAL
  Filled 2021-02-19 (×5): qty 1

## 2021-02-19 MED ORDER — MORPHINE SULFATE (PF) 2 MG/ML IV SOLN
0.5000 mg | INTRAVENOUS | Status: DC | PRN
  Administered 2021-02-23: 0.5 mg via INTRAVENOUS
  Filled 2021-02-19: qty 1

## 2021-02-19 MED ORDER — SERTRALINE HCL 50 MG PO TABS
25.0000 mg | ORAL_TABLET | Freq: Every day | ORAL | Status: DC
  Administered 2021-02-19 – 2021-02-24 (×5): 25 mg via ORAL
  Filled 2021-02-19 (×5): qty 1

## 2021-02-19 MED ORDER — SODIUM CHLORIDE 0.9 % IV SOLN: INTRAVENOUS | Status: AC

## 2021-02-19 MED ORDER — THIAMINE HCL 100 MG PO TABS
100.0000 mg | ORAL_TABLET | Freq: Every day | ORAL | Status: DC
  Administered 2021-02-19 – 2021-02-22 (×3): 100 mg via ORAL
  Filled 2021-02-19 (×3): qty 1

## 2021-02-19 MED ORDER — BOOST / RESOURCE BREEZE PO LIQD CUSTOM
1.0000 | Freq: Three times a day (TID) | ORAL | Status: DC
  Administered 2021-02-21 – 2021-02-22 (×2): 1 via ORAL

## 2021-02-19 MED ORDER — FOLIC ACID 1 MG PO TABS
1.0000 mg | ORAL_TABLET | Freq: Every day | ORAL | Status: DC
  Administered 2021-02-19 – 2021-02-24 (×5): 1 mg via ORAL
  Filled 2021-02-19 (×5): qty 1

## 2021-02-19 MED ORDER — POLYETHYLENE GLYCOL 3350 17 G PO PACK
17.0000 g | PACK | Freq: Two times a day (BID) | ORAL | Status: DC
  Administered 2021-02-19 – 2021-02-23 (×5): 17 g via ORAL
  Filled 2021-02-19 (×7): qty 1

## 2021-02-19 MED ORDER — ONDANSETRON HCL 4 MG/2ML IJ SOLN
4.0000 mg | Freq: Once | INTRAMUSCULAR | Status: AC
  Administered 2021-02-19: 4 mg via INTRAVENOUS
  Filled 2021-02-19: qty 2

## 2021-02-19 MED ORDER — DM-GUAIFENESIN ER 30-600 MG PO TB12: 1.0000 | ORAL_TABLET | Freq: Two times a day (BID) | ORAL | Status: DC | PRN

## 2021-02-19 NOTE — Consult Note (Addendum)
Consultation Note Date: 02/19/2021   Patient Name: Stephen [name]  DOB: 1946/10/05  MRN: [id number]  Age / Sex: 75 y.o., male  PCP: [name], MD Referring Physician: Ivor Costa, MD  Reason for Consultation: Establishing goals of care  HPI/Patient Profile: Stephen [name] is a 75 y.o. 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  Clinical Assessment and Goals of Care: Patient is resting in bed. He is very cheerful. He states he lives at home with his wife. He states he moved to [place] when he was a child and moved back to the [place] when he was around 75 years old.   He states he is not able to walk well and spends a lot of time in his chair; he states he has a good QOL that he is happy with.   We discussed his diagnosis, prognosis, GOC, EOL wishes disposition and options.  Created space and opportunity for patient  to explore thoughts and feelings regarding current medical information.   A detailed discussion was had today regarding advanced directives.  Concepts specific to code status, artifical feeding and hydration, IV antibiotics and rehospitalization were discussed.  The difference between an aggressive medical intervention path and a comfort care path was discussed.  Values and goals of care important to patient and family were attempted to be elicited.  Discussed limitations of medical interventions to prolong quality of life in some situations and discussed the concept of human mortality.  He states he has discussed GOC many times in the past with healthcare providers. He states "I want any care that will give me even an extra 5 minutes." He confirms wanting CPR.      SUMMARY OF RECOMMENDATIONS   Full code/full scope.    Prognosis:  Unable to determine  Recommend outpatient palliative       Primary Diagnoses: Present on Admission: . Rectal cancer ([place]) . HTN (hypertension) . Smoker . UTI (urinary tract infection) . Acute respiratory failure with hypoxia ([place]) . Aspiration pneumonia ([place]) . Partial small bowel obstruction ([place]) . Severe sepsis ([place]) . GERD (gastroesophageal reflux disease) . Depression   I have reviewed the medical record, interviewed the patient and family, and examined the patient. The following aspects are pertinent.  Past Medical History:  Diagnosis Date  . Allergic rhinitis   . Blood clot in vein 2015   left leg   . History of anemia   . History of chicken pox   . HTN (hypertension)   . Neuropathy    arms and legs - S/P Cancer tx  . Rectal cancer metastasized to lung [facility]) 09-2013   surgery and chemo  . Smoker    Social History   Socioeconomic History  . Marital status: Married    Spouse name: Not on file  . Number of children: Not on file  . Years  of education: Not on file  . Highest education level: Not on file  Occupational History  . Not on file  Tobacco Use  . Smoking status: Current Every Day Smoker    Packs/day: 0.50    Years: 15.00    Pack years: 7.50    Types: Cigarettes  . Smokeless tobacco: Never Used  Vaping Use  . Vaping Use: Never used  Substance and Sexual Activity  . Alcohol use: Yes    Alcohol/week: 18.0 standard drinks    Types: 18 Shots of liquor per week    Comment: Regular 2-3 scotch/day  . Drug use: No  . Sexual activity: Not on file  Other Topics Concern  . 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 Hypertension Mother   . Coronary artery disease Mother        angina  . Cancer Mother        breast  . Cancer Father 91       esophageal  . COPD Brother        emphysema  . Cancer Paternal Grandmother        colon  . Diabetes Neg Hx    Scheduled Meds: . aspirin EC  81 mg Oral Daily  . bisacodyl  5 mg Oral BID  . folic acid  1 mg Oral Daily  . heparin  5,000 Units Subcutaneous Q8H  . LORazepam  0-4 mg Intravenous Q6H   Followed by  . [START ON 02/21/2021] LORazepam  0-4 mg Intravenous Q12H  . multivitamin with minerals  1 tablet Oral Daily  . nicotine  21 mg Transdermal Daily  . pantoprazole sodium  40 mg Oral Daily  . polyethylene glycol  17 g Oral BID  . sertraline  25 mg Oral Daily  . thiamine  100 mg Oral Daily   Or  . thiamine  100 mg Intravenous Daily   Continuous Infusions: . sodium chloride Stopped (02/19/21 1133)  . piperacillin-tazobactam (ZOSYN)  IV 3.375 g (02/19/21 1454)   PRN Meds:.acetaminophen, acetaminophen, albuterol, dextromethorphan-guaiFENesin, hydrALAZINE, loratadine, LORazepam **OR** LORazepam, morphine injection, ondansetron (ZOFRAN) IV, oxyCODONE-acetaminophen Medications Prior to Admission:  Prior to Admission medications   Medication Sig Start Date End Date Taking? Authorizing Provider  aspirin EC 81 MG tablet Take 81 mg by mouth daily.   Yes [provider]  bisacodyl (DULCOLAX) 5 MG EC tablet Take 5 mg by mouth daily as needed for moderate constipation.   Yes [provider]  esomeprazole (NEXIUM) 20 MG packet Take 20 mg by mouth daily before breakfast.   Yes [provider]  loratadine (CLARITIN) 10 MG tablet Take 10 mg by mouth daily as needed.    Yes [provider]  olmesartan (BENICAR) 20 MG tablet Take 20 mg by mouth daily.   Yes [provider]  oxyCODONE-acetaminophen (PERCOCET) 7.5-325 MG tablet Take 1 tablet by mouth every 6 (six) hours as needed for  severe pain. 06/24/20  Yes Sable Feil, PA-C  sertraline (ZOLOFT) 25 MG tablet Take 25 mg by mouth daily. 12/19/20  Yes [provider]  pyridOXINE (B-6) 50 MG tablet Take 1 tablet by mouth. Patient not taking: No sig reported    [provider]   Allergies  Allergen Reactions  . No Known Allergies    Review of Systems  All other systems reviewed and are negative.   Physical Exam Pulmonary:     Effort: Pulmonary effort is normal.  Neurological:     Mental Status: He is alert.     Vital Signs: BP (!) 99/55 (BP Location: Left Arm)   Pulse 90   Temp 98.5 F (36.9 C) (Oral)   Resp 20   Ht 5\' 7"  (1.702 m)   Wt 77.1 kg   SpO2 94%   BMI 26.63 kg/m  Pain Scale: 0-10   Pain Score: 5    SpO2: SpO2: 94 % O2 Device:SpO2: 94 % O2 Flow Rate: .O2 Flow Rate (L/min): 2 L/min  IO: Intake/output summary:   Intake/Output Summary (Last 24 hours) at 02/19/2021 1610 Last data filed at 02/19/2021 1601 Gross per 24 hour  Intake 3546.72 ml  Output --  Net 3546.72 ml    LBM:   Baseline Weight: Weight: 77.1 kg Most recent weight: Weight: 77.1 kg     Palliative Assessment/Data:     Time In: 3:00 Time Out: 3:30 Time Total: 30 min Greater than 50%  of this time was spent counseling and coordinating care related to the above assessment and plan.  Signed by: Asencion Gowda, NP   Please contact Palliative Medicine Team phone at [phone] for questions and concerns.  For individual provider: See Shea Evans

## 2021-02-19 NOTE — ED Notes (Signed)
All documentation completed by Berniece Salines SRN was reviewed and approved by this RN

## 2021-02-19 NOTE — Sepsis Progress Note (Signed)
Following for sepsis monitoring ?

## 2021-02-19 NOTE — Consult Note (Signed)
Pharmacy Antibiotic Note  Stephen [name] is a 75 y.o. male admitted on 02/19/2021 with aspiration PNA.   (though also covering for any potential abdominal pathogens)  Pharmacy has been consulted for Zosyn dosing.  Plan: Zosyn 3.375g IV q8h (4 hour infusion).  Height: 5\' 7"  (170.2 cm) Weight: 77.1 kg (170 lb) IBW/kg (Calculated) : 66.1  Temp (24hrs), Avg:99.2 F (37.3 C), Min:99.2 F (37.3 C), Max:99.2 F (37.3 C)  Recent Labs  Lab 02/19/21 0506 02/19/21 0509  WBC 21.6*  --   CREATININE 0.80  --   LATICACIDVEN  --  3.1*    Estimated Creatinine Clearance: 75.7 mL/min (by C-G formula based on SCr of 0.8 mg/dL).    Allergies  Allergen Reactions  . No Known Allergies     Antimicrobials this admission: Zosyn 4/8 >>  Dose adjustments this admission: N/A  Microbiology results: 4/8 BCx: pending 4/8 UCx: pending   COVID/FLU NEG   Thank you for allowing pharmacy to be a part of this patient's care.  Lu Duffel, PharmD, BCPS Clinical Pharmacist 02/19/2021 7:36 AM

## 2021-02-19 NOTE — ED Notes (Signed)
SRN attempted to obtain labs at this time, pt currently in CT.

## 2021-02-19 NOTE — Evaluation (Addendum)
Clinical/Bedside Swallow Evaluation Patient Details  Name: Stephen [name] MRN: [id number] Date of Birth: 09/03/1946  Today's Date: 02/19/2021 Time: SLP Start Time (ACUTE ONLY): 43 SLP Stop Time (ACUTE ONLY): 11120 SLP Time Calculation (min) (ACUTE ONLY): 60 min  Past Medical History:  Past Medical History:  Diagnosis Date  . Allergic rhinitis   . Blood clot in vein 2015   left leg   . History of anemia   . History of chicken pox   . HTN (hypertension)   . Neuropathy    arms and legs - S/P Cancer tx  . Rectal cancer metastasized to lung [facility]) 09-2013   surgery and chemo  . Smoker    Past Surgical History:  Past Surgical History:  Procedure Laterality Date  . COLON RESECTION  2015  . COLONOSCOPY  2015  . EUS N/A 11/15/2013   Procedure: LOWER ENDOSCOPIC ULTRASOUND (EUS);  Surgeon: Milus Banister, MD;  Location: [facility];  Service: Endoscopy;  Laterality: N/A;  . LUNG BIOPSY    . LUNG LOBECTOMY Left   . lung removed  2015   left lower   . PLANTAR FASCIA RELEASE Right 09/20/2017   Procedure: PLANTAR FASCIA [id number], excision plantar fibroma right foot;  Surgeon: Samara Deist, DPM;  Location: [place];  Service: Podiatry;  Laterality: Right;  . TONSILLECTOMY AND ADENOIDECTOMY  Childhood  . VARICOSE VEIN SURGERY  2010   HPI:  Pt is a 75 y.o. male with medical history significant of rectal Cancer metastasized to lung (s/p for colostomy, chemotherapy), HTn, GERD, depression, GERD, smoker, ETOH use, left leg blood clot not on anticoagulants, who presents with constipation, nausea, vomiting, abdominal pain, weakness.  Per patient and his wife, patient has been generalized weakness for almost 2 weeks. Wife endorses pt does not hydrate well at Baseline.  He has been constipated for more than a week. He developed nausea, vomiting and abdominal pain. He has 6-7 times of nonbilious nonbloody vomiting.  He has diffuse, mild to moderate abdominal pain.  No fever or chills.   Patient also has dry cough, shortness of breath.  No chest pain.  Patient complains of dysuria, burning on urination and increased urinary frequency.  Concern for Dehydration per Wife.  He continues to drink Alcohol heavily per his wife.  Pt and Wife c/o pt being "shaky" in his UEs unable to feed himself as well as his [place] being "very dark".   Per CT of Abd.: "Partial small bowel obstruction without focal transition point.  2. Small volume of reactive ascites in the right pelvis in the  region of dilated and fluid-filled small bowel.  3. Bilateral lower lobe atelectasis versus small volume aspiration  superimposed on a background of diffuse chronic bronchial wall  thickening.  4. Mild circumferential thickening of the distal esophagus  concerning for esophagitis.".   Assessment / Plan / Recommendation Clinical Impression  Pt appears to present w/ grossly adequate oropharyngeal phase swallow function w/ No immediate, overt oropharyngeal phase dysphagia noted, No neuromuscular deficits noted. Pt consumed po trials given w/ No immediate overt, clinical s/s of aspiration during/post po trials; a mild throat clear was noted x1 at end of trials but this was not consistent nor did it increase in frequency. Pt appears at reduced risk for aspiration following general aspiration precautions. However, pt has a Baseline of Esophageal irritation, possible Esophagitis, per CT of Abd done this admit along w/ a SBO currently. Pt also has ETOH use at home and is mildly distracted  requeing verbal cues to redirect and follow through w/ his attention to tasks. During po trials, pt consumed all consistencies w/ no immediate, overt coughing, decline in vocal quality, or change in respiratory presentation during/post trials. O2 sats remained 97-98%. Oral phase appeared [facility] w/ timely bolus management and control of bolus propulsion for A-P transfer for swallowing. Solid food trials were not assessed d/t SBO but no deficits noted w/  management of puree food. Oral clearing achieved w/ all trial consistencies. OM Exam appeared [facility] w/ no unilateral weakness noted. Speech Clear when he put forth his effort and attetion -- often muttered speech. Unsure if any Baseline Cognitive decline(?). Pt fed self w/ setup support of Cup when drinking. No UE shakiness noted during this task as at home previously, per Wife. Recommend continue current diet as per MD/GI d/t SBO w/ upgrade to a Regular consistency diet w/ well-Cut meats, moistened foods; Thin liquids when appropriate GI-wise. Recommend general aspiration precautions, Pills WHOLE in Puree for safer, easier swallowing. Reflux precautions recommended. Support at meals w/ feeding. Education given on Pills in Puree; food consistencies and easy to eat options; general aspiration and reflux precautions. NSG to reconsult if any new needs arise. MD updated. NSG agreed. SLP Visit Diagnosis: Dysphagia, unspecified (R13.10) (Esophageal phase deficits)    Aspiration Risk  Mild aspiration risk;Risk for inadequate nutrition/hydration (reduced following general precautions; Reflux precs)    Diet Recommendation  clear liquid diet currently d/t SBO per MD; can upgrade to a Regular consistency diet when appropriate. Recommend general aspiration and REFLUX precautions. Support at meals for feeding, tray setup, needs.   Medication Administration: Whole meds with puree (for safer swallowing)    Other  Recommendations Recommended Consults: Consider GI evaluation;Consider esophageal assessment (possible Esophagitis; Dietician f/u) Oral Care Recommendations: Oral care BID;Oral care before and after PO;Staff/trained caregiver to provide oral care (support) Other Recommendations:  (n/a)   Follow up Recommendations None      Frequency and Duration  (n/a)   (n/a)       Prognosis Prognosis for Safe Diet Advancement: Fair (-Good) Barriers to Reach Goals: Cognitive deficits;Time post onset;Severity of  deficits;Behavior (Baseline Esophageal issues; ETOH use) Barriers/Prognosis Comment: unsure of baseline Cognitive status      Swallow Study   General Date of Onset: 02/19/21 HPI: Pt is a 75 y.o. male with medical history significant of rectal Cancer metastasized to lung (s/p for colostomy, chemotherapy), HTn, GERD, depression, GERD, smoker, ETOH use, left leg blood clot not on anticoagulants, who presents with constipation, nausea, vomiting, abdominal pain, weakness.  Per patient and his wife, patient has been generalized weakness for almost 2 weeks. Wife endorses pt does not hydrate well at Baseline.  He has been constipated for more than a week. He developed nausea, vomiting and abdominal pain. He has 6-7 times of nonbilious nonbloody vomiting.  He has diffuse, mild to moderate abdominal pain.  No fever or chills.  Patient also has dry cough, shortness of breath.  No chest pain.  Patient complains of dysuria, burning on urination and increased urinary frequency.  Concern for Dehydration per Wife.  He continues to drink Alcohol heavily per his wife.  Pt and Wife c/o pt being "shaky" in his UEs unable to feed himself as well as his [place] being "very dark".   Per CT of Abd.: "Partial small bowel obstruction without focal transition point.  2. Small volume of reactive ascites in the right pelvis in the  region of dilated and fluid-filled small bowel.  3. Bilateral lower lobe atelectasis versus small volume aspiration  superimposed on a background of diffuse chronic bronchial wall  thickening.  4. Mild circumferential thickening of the distal esophagus  concerning for esophagitis.". Type of Study: Bedside Swallow Evaluation Previous Swallow Assessment: none Diet Prior to this Study: Thin liquids (Clears d/t SBO) Temperature Spikes Noted:  (wbc elevated, 21.6 declining) Respiratory Status: Nasal cannula (2L) History of Recent Intubation: No Behavior/Cognition: Alert;Cooperative;Pleasant  mood;Distractible;Requires cueing Oral Cavity Assessment: Within Functional Limits (pt c/o it feeling "slick") Oral Care Completed by SLP: Yes Oral Cavity - Dentition: Adequate natural dentition Vision: Functional for self-feeding Self-Feeding Abilities: Able to feed self;Needs assist;Needs set up (held cup to drink this AM) Patient Positioning: Upright in bed (needed positioning) Baseline Vocal Quality: Normal (though muttered speech at times) Volitional Cough: Strong Volitional Swallow: Able to elicit    Oral/Motor/Sensory Function Overall Oral Motor/Sensory Function: Within functional limits   Ice Chips Ice chips: Within functional limits Presentation: Spoon (fed; 2 trials)   Thin Liquid Thin Liquid: Within functional limits Presentation: Cup;Self Fed;Straw (6 trials via each) Other Comments: mild throat clear at end of trials    Nectar Thick Nectar Thick Liquid: Not tested   Honey Thick Honey Thick Liquid: Not tested   Puree Puree: Within functional limits Presentation: Spoon (fed; 2 trials) Other Comments: limited d/t SBO   Solid     Solid: Not tested Other Comments: d/t SBO       Orinda Kenner, MS, CCC-SLP Speech Language Pathologist Rehab Services [phone] Tiwanda Threats 02/19/2021,1:50 PM

## 2021-02-19 NOTE — Progress Notes (Signed)
Initial Nutrition Assessment  DOCUMENTATION CODES:   Non-severe (moderate) malnutrition in context of chronic illness  INTERVENTION:   -Magic cup TID with meals, each supplement provides 290 kcal and 9 grams of protein -MVI with minerals daily  NUTRITION DIAGNOSIS:   Moderate Malnutrition related to chronic illness (rectal cancer with mets to lung) as evidenced by energy intake < or equal to 75% for > or equal to 1 month,mild fat depletion,moderate fat depletion,mild muscle depletion,moderate muscle depletion.  GOAL:   Patient will meet greater than or equal to 90% of their needs  MONITOR:   PO intake,Supplement acceptance,Diet advancement,Labs,Weight trends,Skin  REASON FOR ASSESSMENT:   Malnutrition Screening Tool    ASSESSMENT:   Stephen [name] is a 75 y.o. 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  Pt admitted with partial small bowel obstruction.   4/8- s/p BSE- recommend clear liquids and advance as tolerated  Reviewed I/O's: +3.5 L x 24 hours  Case discussed with RN, who reports pt does not have a bowel obstruction. Plan to initiate clear liquids and advance as tolerated.   Spoke with pt and wife at bedside, who report decreased appetite over the past 6 months. Per pt, he has little desire to eat most foods and he is very picky. Intake consists mostly of french toast, eggs, soup, and vegetables. Per wife, pt drinks alcohol excessively.   Per pt, UBW is around 195#. He endorses wt loss, however, unsure how much. Pt wife thinks he has lost about 30 pounds. Reviewed wt hx; pt has experienced 10.6% wt loss over the past 8 months.   Discussed importance of good meal and supplement intake to promote healing. Pt does not tolerate milky  supplements well, but is amenable to [employer].  Medications reviewed and include folic acid, ativan, miralax, thiamine, and 0.9% sodium chloride infusion @ 100 ml/hr.   Labs reviewed.   Diet Order:   Diet Order            Diet clear liquid Room service appropriate? Yes; Fluid consistency: Thin  Diet effective now                 EDUCATION NEEDS:   Education needs have been addressed  Skin:  Skin Assessment: Reviewed RN Assessment  Last BM:  Unknown  Height:   Ht Readings from Last 1 Encounters:  02/19/21 5\' 7"  (1.702 m)    Weight:   Wt Readings from Last 1 Encounters:  02/19/21 77.1 kg    Ideal Body Weight:  67.3 kg  BMI:  Body mass index is 26.63 kg/m.  Estimated Nutritional Needs:   Kcal:  2100-2300  Protein:  115-130 grams  Fluid:  > 2 L    Loistine Chance, RD, LDN, [place] Registered Dietitian II Certified Diabetes Care and Education Specialist Please refer to [facility] for RD and/or RD on-call/weekend/after hours pager

## 2021-02-19 NOTE — H&P (Addendum)
History and Physical    Stephen [name] UTM:[id number] DOB: 06-14-1946 DOA: 02/19/2021  Referring MD/NP/PA:   PCP: [name], MD   Patient coming from:  The patient is coming from home.  At baseline, pt is independent for most of ADL.        Chief Complaint: Constipation, nausea, vomiting, abdominal pain, weakness  HPI: Stephen [name] is a 75 y.o. 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  Per patient's and his wife, patient has been generalized weakness for almost 2 weeks.  He has been constipated for more than a week.  He does not have stool in colostomy.  He developed nausea, vomiting and abdominal pain.  He has 6-7 times of nonbilious nonbloody vomiting.  He has diffuse, mild to moderate abdominal pain.  No fever or chills.  Patient also has dry cough, shortness of breath.  No chest pain.  Patient complains of dysuria, burning on urination and increased urinary frequency.  He continues to drink alcohol heavily per his wife.  ED Course: pt was found to have WBC 21.6, lactic acid 3.1, troponin level 9, negative Covid PCR, urinalysis (cloudy appearance, large amount of leukocyte, rare bacteria, WBC> 50), temperature 99.2, blood pressure 122/67, heart rate 116, 102, RR 22, oxygen saturation 88% on room air, which improved to 95% on 2 L oxygen.  Chest x-ray showed asymmetric hazy infiltrates in the right basilar area.  CT abdomen/pelvis that showed partial small bowel obstruction.  Patient is admitted to [facility] bed as inpatient.  General surgeon, Dr. Celine Ahr and PA Delena Bali are consulted.  CT-abd/pelvis 1. Partial small bowel obstruction without focal transition point. 2. Small volume of reactive ascites in the right pelvis in the region of dilated and  fluid-filled small bowel. 3. Bilateral lower lobe atelectasis versus small volume aspiration superimposed on a background of diffuse chronic bronchial wall thickening. 4. Mild circumferential thickening of the distal esophagus concerning for esophagitis. Does the patient have a clinical history of GERD? 5. Cholelithiasis. 6. Surgical changes of prior low anterior resection with left lower quadrant colostomy. 7.  Aortic Atherosclerosis (ICD10-170.0) 8. L4 and L5 compression fractures are new compared to prior imaging from June of 2021 and have likely occurred at some point in the interim. No findings on today's examination to suggest acute fractures.  Review of Systems:   General: no fevers, chills, no body weight gain, has poor appetite, has fatigue HEENT: no blurry vision, hearing changes or sore throat Respiratory: has dyspnea, coughing, no wheezing CV: no chest pain, no palpitations GI: has nausea, vomiting, abdominal pain, constipation GU: no dysuria, burning on urination, increased urinary frequency, hematuria  Ext: no leg edema Neuro: no unilateral weakness, numbness, or tingling, no vision change or hearing loss Skin: no rash, no skin tear. MSK: No muscle spasm, no deformity, no limitation of range of movement in spin Heme: No easy bruising.  Travel history: No recent long distant travel.  Allergy:  Allergies  Allergen Reactions  . No Known Allergies     Past Medical History:  Diagnosis Date  . Allergic rhinitis   . Blood clot in vein 2015   left leg   . History of anemia   . History of chicken pox   . HTN (hypertension)   . Neuropathy    arms and legs -  S/P Cancer tx  . Rectal cancer metastasized to lung [facility]) 09-2013   surgery and chemo  . Smoker     Past Surgical History:  Procedure Laterality Date  . COLON RESECTION  2015  . COLONOSCOPY  2015  . EUS N/A 11/15/2013   Procedure: LOWER ENDOSCOPIC ULTRASOUND (EUS);  Surgeon: Milus Banister, MD;   Location: [facility];  Service: Endoscopy;  Laterality: N/A;  . LUNG BIOPSY    . LUNG LOBECTOMY Left   . lung removed  2015   left lower   . PLANTAR FASCIA RELEASE Right 09/20/2017   Procedure: PLANTAR FASCIA [id number], excision plantar fibroma right foot;  Surgeon: Samara Deist, DPM;  Location: [facility];  Service: Podiatry;  Laterality: Right;  . TONSILLECTOMY AND ADENOIDECTOMY  Childhood  . VARICOSE VEIN SURGERY  2010    Social History:  reports that he has been smoking cigarettes. He has a 7.50 pack-year smoking history. He has never used smokeless tobacco. He reports current alcohol use of about 18.0 standard drinks of alcohol per week. He reports that he does not use drugs.  Family History:  Family History  Problem Relation Age of Onset  . Hypertension Mother   . Coronary artery disease Mother        angina  . Cancer Mother        breast  . Cancer Father 36       esophageal  . COPD Brother        emphysema  . Cancer Paternal Grandmother        colon  . Diabetes Neg Hx      Prior to Admission medications   Medication Sig Start Date End Date Taking? Authorizing Provider  aspirin EC 81 MG tablet Take 81 mg by mouth daily.   Yes [provider]  bisacodyl (DULCOLAX) 5 MG EC tablet Take 5 mg by mouth daily as needed for moderate constipation.   Yes [provider]  esomeprazole (NEXIUM) 20 MG packet Take 20 mg by mouth daily before breakfast.   Yes [provider]  folic acid (FOLVITE) 1 MG tablet Take 1 tablet by mouth 1 day or 1 dose. 04/23/19  Yes [provider]  loratadine (CLARITIN) 10 MG tablet Take 10 mg by mouth daily as needed.   Yes [provider]  olmesartan (BENICAR) 20 MG tablet Take 20 mg by mouth daily.   Yes [provider]  oxyCODONE-acetaminophen (PERCOCET) 7.5-325 MG tablet Take 1 tablet by mouth every 6 (six) hours as needed for severe pain. 06/24/20  Yes Sable Feil, PA-C  pyridOXINE  (B-6) 50 MG tablet Take 1 tablet by mouth.   Yes [provider]  sertraline (ZOLOFT) 25 MG tablet Take 25 mg by mouth daily. 12/19/20  Yes [provider]    Physical Exam: Vitals:   02/19/21 0507 02/19/21 0600 02/19/21 0645 02/19/21 0700  BP:  (!) 148/87 122/67   Pulse:  (!) 104 99 (!) 102  Resp:  17 (!) 21 20  Temp: 99.2 F (37.3 C)     SpO2:  96% 95% 95%  Weight:      Height:       General: Not in acute distress HEENT:       Eyes: PERRL, EOMI, no scleral icterus.       ENT: No discharge from the ears and nose, no pharynx injection, no tonsillar enlargement.        Neck: No JVD, no bruit, no mass  felt. Heme: No neck lymph node enlargement. Cardiac: S1/S2, RRR, No murmurs, No gallops or rubs. Respiratory: No rales, wheezing, rhonchi or rubs. GI: Soft, mildly distended, tenderness in central abdomen, no rebound pain, no organomegaly, BS present. Colostomy in LLQ with stool in bag  GU: No hematuria Ext: No pitting leg edema bilaterally. 1+DP/PT pulse bilaterally. Musculoskeletal: No joint deformities, No joint redness or warmth, no limitation of ROM in spin. Skin: No rashes.  Neuro: Alert, oriented X3, cranial nerves II-XII grossly intact, moves all extremities Psych: Patient is not psychotic, no suicidal or hemocidal ideation.  Labs on Admission: I have personally reviewed following labs and imaging studies  CBC: Recent Labs  Lab 02/19/21 0506  WBC 21.6*  NEUTROABS 20.0*  HGB 15.4  HCT 44.0  MCV 100.5*  PLT 762   Basic Metabolic Panel: Recent Labs  Lab 02/19/21 0506  NA 132*  K 4.5  CL 98  CO2 21*  GLUCOSE 146*  BUN 13  CREATININE 0.80  CALCIUM 9.7   GFR: Estimated Creatinine Clearance: 75.7 mL/min (by C-G formula based on SCr of 0.8 mg/dL). Liver Function Tests: Recent Labs  Lab 02/19/21 0506  AST 56*  ALT 21  ALKPHOS 96  BILITOT 1.4*  PROT 7.8  ALBUMIN 3.4*   Recent Labs  Lab 02/19/21 0506  LIPASE 29   No results for  input(s): AMMONIA in the last 168 hours. Coagulation Profile: No results for input(s): INR, PROTIME in the last 168 hours. Cardiac Enzymes: No results for input(s): CKTOTAL, CKMB, CKMBINDEX, TROPONINI in the last 168 hours. BNP (last 3 results) No results for input(s): PROBNP in the last 8760 hours. HbA1C: No results for input(s): HGBA1C in the last 72 hours. CBG: No results for input(s): GLUCAP in the last 168 hours. Lipid Profile: No results for input(s): CHOL, HDL, LDLCALC, TRIG, CHOLHDL, LDLDIRECT in the last 72 hours. Thyroid Function Tests: No results for input(s): TSH, T4TOTAL, FREET4, T3FREE, THYROIDAB in the last 72 hours. Anemia Panel: No results for input(s): VITAMINB12, FOLATE, FERRITIN, TIBC, IRON, RETICCTPCT in the last 72 hours. Urine analysis:    Component Value Date/Time   COLORURINE AMBER (A) 02/19/2021 0506   APPEARANCEUR CLOUDY (A) 02/19/2021 0506   APPEARANCEUR Cloudy 04/28/2014 0900   LABSPEC 1.021 02/19/2021 0506   LABSPEC 1.023 04/28/2014 0900   PHURINE 6.0 02/19/2021 0506   GLUCOSEU NEGATIVE 02/19/2021 0506   GLUCOSEU Negative 04/28/2014 0900   HGBUR NEGATIVE 02/19/2021 0506   BILIRUBINUR SMALL (A) 02/19/2021 0506   BILIRUBINUR Negative 04/28/2014 0900   KETONESUR NEGATIVE 02/19/2021 0506   PROTEINUR 30 (A) 02/19/2021 0506   NITRITE NEGATIVE 02/19/2021 0506   LEUKOCYTESUR LARGE (A) 02/19/2021 0506   LEUKOCYTESUR Negative 04/28/2014 0900   Sepsis Labs: @LABRCNTIP (procalcitonin:4,lacticidven:4) ) Recent Results (from the past 240 hour(s))  Resp Panel by RT-PCR (Flu A&B, Covid) Nasopharyngeal Swab     Status: None   Collection Time: 02/19/21  5:06 AM   Specimen: Nasopharyngeal Swab; Nasopharyngeal(NP) swabs in vial transport medium  Result Value Ref Range Status   SARS Coronavirus 2 by RT PCR NEGATIVE NEGATIVE Final    Comment: (NOTE) SARS-CoV-2 target nucleic acids are NOT DETECTED.  The SARS-CoV-2 RNA is generally detectable in upper  respiratory specimens during the acute phase of infection. The lowest concentration of SARS-CoV-2 viral copies this assay can detect is 138 copies/mL. A negative result does not preclude SARS-Cov-2 infection and should not be used as the sole basis for treatment or other patient management decisions. A negative  result may occur with  improper specimen collection/handling, submission of specimen other than nasopharyngeal swab, presence of viral mutation(s) within the areas targeted by this assay, and inadequate number of viral copies(<138 copies/mL). A negative result must be combined with clinical observations, patient history, and epidemiological information. The expected result is Negative.  Fact Sheet for Patients:  EntrepreneurPulse.com.au  Fact Sheet for Healthcare Providers:  IncredibleEmployment.be  This test is no t yet approved or cleared by the [place] FDA and  has been authorized for detection and/or diagnosis of SARS-CoV-2 by FDA under an Emergency Use Authorization (EUA). This EUA will remain  in effect (meaning this test can be used) for the duration of the COVID-19 declaration under Section 564(b)(1) of the Act, 21 U.S.C.section 360bbb-3(b)(1), unless the authorization is terminated  or revoked sooner.       Influenza A by PCR NEGATIVE NEGATIVE Final   Influenza B by PCR NEGATIVE NEGATIVE Final    Comment: (NOTE) The Xpert Xpress SARS-CoV-2/FLU/RSV plus assay is intended as an aid in the diagnosis of influenza from Nasopharyngeal swab specimens and should not be used as a sole basis for treatment. Nasal washings and aspirates are unacceptable for Xpert Xpress SARS-CoV-2/FLU/RSV testing.  Fact Sheet for Patients: EntrepreneurPulse.com.au  Fact Sheet for Healthcare Providers: IncredibleEmployment.be  This test is not yet approved or cleared by the [place] FDA and has been  authorized for detection and/or diagnosis of SARS-CoV-2 by FDA under an Emergency Use Authorization (EUA). This EUA will remain in effect (meaning this test can be used) for the duration of the COVID-19 declaration under Section 564(b)(1) of the Act, 21 U.S.C. section 360bbb-3(b)(1), unless the authorization is terminated or revoked.  Performed at [facility], [address] [place], [place] [address]      Radiological Exams on Admission: CT Abdomen Pelvis Wo Contrast  Result Date: 02/19/2021 CLINICAL DATA:  Constipation with no stool from colostomy for the past week and developing nausea/vomiting EXAM: CT ABDOMEN AND PELVIS WITHOUT CONTRAST TECHNIQUE: Multidetector CT imaging of the abdomen and pelvis was performed following the standard protocol without IV contrast. COMPARISON:  PET-CT 05/21/2020; CT abdomen/pelvis 05/04/2020 FINDINGS: Lower chest: Dependent airspace opacities present in both lower lobes. Moderate respiratory motion artifact. Diffuse mild bronchial wall thickening. Visualized cardiac structures are normal in size. No pericardial effusion. Mild circumferential thickening of the distal esophagus. Hepatobiliary: Small radiodensities layer in the gallbladder lumen consistent with cholelithiasis. No inflammatory changes or pericholecystic fluid. Stable small probable hepatic cyst. No solid lesion. No intra or extrahepatic biliary ductal dilatation. Pancreas: Unremarkable. No pancreatic ductal dilatation or surrounding inflammatory changes. Spleen: Normal in size without focal abnormality. Adrenals/Urinary Tract: Unremarkable adrenal glands. No hydronephrosis or nephrolithiasis. Stable exophytic cyst from the right kidney. The bladder is relatively decompressed. Unremarkable ureters. Stomach/Bowel: Multiple loops of dilated and fluid-filled small bowel with internal air-fluid levels present within the low midline abdomen. No focal transition point. There is gradual tapering to  normal caliber. The colon is decompressed. Normal appendix in the right lower quadrant. Surgical changes of probable prior low anterior resection with left lower quadrant end colostomy. Vascular/Lymphatic: Limited evaluation in the absence of intravenous contrast. Atherosclerotic calcifications present along the abdominal aorta. No aneurysm. No suspicious lymphadenopathy. Reproductive: Prostate is unremarkable. Other: Small volume ascites in the right anatomic pelvis. Musculoskeletal: Compression fracture of L4 with approximately 60% height loss. Compression fracture of L5 with approximately 30% height loss. Findings are new compared to prior imaging from June of 2021. IMPRESSION: 1.  Partial small bowel obstruction without focal transition point. 2. Small volume of reactive ascites in the right pelvis in the region of dilated and fluid-filled small bowel. 3. Bilateral lower lobe atelectasis versus small volume aspiration superimposed on a background of diffuse chronic bronchial wall thickening. 4. Mild circumferential thickening of the distal esophagus concerning for esophagitis. Does the patient have a clinical history of GERD? 5. Cholelithiasis. 6. Surgical changes of prior low anterior resection with left lower quadrant colostomy. 7.  Aortic Atherosclerosis (ICD10-170.0) 8. L4 and L5 compression fractures are new compared to prior imaging from June of 2021 and have likely occurred at some point in the interim. No findings on today's examination to suggest acute fractures. Electronically Signed   By: Jacqulynn Cadet M.D.   On: 02/19/2021 07:26   DG Chest Port 1 View  Result Date: 02/19/2021 CLINICAL DATA:  Hypoxia.  Constipation for 1 week with emesis. EXAM: PORTABLE CHEST 1 VIEW COMPARISON:  Chest CT 11/16/2020. FINDINGS: Interstitial and hazy airspace opacity asymmetric to the right base. Some more rounded density at the medial right base but no nodule was seen in this area on recent chest CT. No pleural  effusion or pneumothorax. Normal heart size and mediastinal contours. IMPRESSION: Hazy pulmonary opacity with asymmetric right-sided involvement more concerning for pneumonia or aspiration. Electronically Signed   By: Monte Fantasia M.D.   On: 02/19/2021 05:17     EKG: I have personally reviewed.  Sinus rhythm, QTC 438, LAD, low voltage, poor R wave progression, T wave inversion in precordial leads.  Assessment/Plan Principal Problem:   Partial small bowel obstruction (HCC) Active Problems:   HTN (hypertension)   Smoker   Rectal cancer (HCC)   UTI (urinary tract infection)   Acute respiratory failure with hypoxia (HCC)   Aspiration pneumonia (HCC)   Severe sepsis (HCC)   GERD (gastroesophageal reflux disease)   Depression   Alcohol use   Partial small bowel obstruction ([place]): CT showed partial small bowel obstruction without focal transition point. General surgeon, Dr. Celine Ahr and PA Delena Bali are consulted. -admit to [facility] as inpatient -Clear liquid diet okay for surgeon -As needed morphine for pain and Zofran for nausea -IV fluid -Start MiraLAX and Dulcolax  Acute respiratory failure with hypoxia due to aspiration pneumonia: -Zosyn started -Albuterol and Mucinex -Blood culture and sputum culture -SLP  UTI (urinary tract infection) -on zosyn as above -f/u Bx and Ux  Severe sepsis due to UTI and aspiration pneumonia: Patient meets criteria for severe sepsis with leukocytosis with WBC 21.6, tachycardia with heart rate 116, tachypnea with RR 22.  Lactic acid is elevated 3.1.  Currently hemodynamically stable -will get Procalcitonin and trend lactic acid levels per sepsis protocol. -IVF: 2.5L of NS bolus in ED, followed by 100 cc/h  -on IV antibiotics of Zosyn as above -Follow-up of blood culture and urine culture  HTN (hypertension): -IV hydralazine as needed -Hold home Benicar since patient is high risk of developing hypotension due to severe sepsis  Smoker and alcohol  use: -Nicotine patch -CIWA protocol  Rectal cancer [facility]): S/p of surgery with colostomy.  Patient finished chemotherapy 2016 per his wife -No acute issues -palliative care consult  GERD (gastroesophageal reflux disease) -Protonix  Depression -Continue home medications, Zoloft  Lumbar compression fracture: CT scan of abd/pelvis showed L4 and L5 compression fractures are new compared to prior imaging from June of 2021 and have likely occurred at some point in the interim. No findings on today's examination to suggest acute fractures. -  Continue home as needed Percocet and tylenol    DVT ppx: SQ Heparin  Code Status: Full code per pt and his wife Family Communication:   Yes, patient's wife   at bed side Disposition Plan:  Anticipate discharge back to previous environment Consults called:  Education officer, environmental, Dr. Celine Ahr and PA Delena Bali are consulted. Admission status and Level of care: Med-Surg:    as inpt       Status is: Inpatient  Remains inpatient appropriate because:Inpatient level of care appropriate due to severity of illness   Dispo: The patient is from: Home              Anticipated d/c is to: Home              Patient currently is not medically stable to d/c.   Difficult to place patient No          Date of Service 02/19/2021    [facility]   If 7PM-7AM, please contact night-coverage [email or web address] 02/19/2021, 9:28 AM

## 2021-02-19 NOTE — Consult Note (Addendum)
[facility] SURGICAL CONSULTATION NOTE (initial) - cpt: [phone]   HISTORY OF PRESENT ILLNESS (HPI):  75 y.o. 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 He also had subsequent laparotomy and lysis of adhesions in June 2015 for SBO. He presented to [facility] ED overnight for evaluation of constipation and emesis. Patient, and his wife, report that he has had abdominal discomfort and decrease in bowel function over the course of the last week or so. They have tried Miralax x2 in the last 24 hours without improvement. Additionally, he has had nausea and emesis over this time as well. No fever, chills, cough, CP, or SOB. Interestingly, his wife also notes that over the last 2 weeks he has had significant decrease in his coordination. For example, he can no longer drink water on his own as he misses his mouth frequently. He has also become weak er to the point of not being able to ambulate as well. Work up in the ED revealed a leukocytosis to 21.6K, normal renal function with sCr - 0.80, mild hyperbilirubinemia to 1.4 (although this was present >1 year ago as well), lactic acidosis to 3.1 (now resolved). He did have CT Abdomen/Pelvis which was concerning of possible SBO without gross transition point. He was ultimately admitted to the hospitalist service.   Surgery is consulted by hospitalist physician Dr. Ivor Costa, MD in this context for evaluation and management of possible SBO.   PAST MEDICAL HISTORY (PMH):  Past Medical History:  Diagnosis Date   Allergic rhinitis    Blood clot in vein 2015   left leg    History of anemia    History of chicken pox    HTN (hypertension)    Neuropathy    arms and legs - S/P Cancer tx   Rectal cancer metastasized to lung [facility]) 09-2013   surgery and chemo   Smoker      PAST SURGICAL HISTORY [facility]):  Past  Surgical History:  Procedure Laterality Date   COLON RESECTION  2015   COLONOSCOPY  2015   EUS N/A 11/15/2013   Procedure: LOWER ENDOSCOPIC ULTRASOUND (EUS);  Surgeon: Milus Banister, MD;  Location: [facility];  Service: Endoscopy;  Laterality: N/A;   LUNG BIOPSY     LUNG LOBECTOMY Left    lung removed  2015   left lower    PLANTAR FASCIA RELEASE Right 09/20/2017   Procedure: PLANTAR FASCIA [id number], excision plantar fibroma right foot;  Surgeon: Samara Deist, DPM;  Location: [facility];  Service: Podiatry;  Laterality: Right;   TONSILLECTOMY AND ADENOIDECTOMY  Childhood   VARICOSE VEIN SURGERY  2010     MEDICATIONS:  Prior to Admission medications   Medication Sig Start Date End Date Taking? Authorizing Provider  aspirin EC 81 MG tablet Take 81 mg by mouth daily.   Yes [provider]  bisacodyl (DULCOLAX) 5 MG EC tablet Take 5 mg by mouth daily as needed for moderate constipation.   Yes [provider]  esomeprazole (NEXIUM) 20 MG packet Take 20 mg by mouth daily before breakfast.   Yes [provider]  loratadine (CLARITIN) 10 MG tablet Take 10 mg by mouth daily as needed.   Yes [provider]  olmesartan (BENICAR) 20 MG tablet Take 20 mg by mouth daily.   Yes [provider]  oxyCODONE-acetaminophen (PERCOCET) 7.5-325  MG tablet Take 1 tablet by mouth every 6 (six) hours as needed for severe pain. 06/24/20  Yes Sable Feil, PA-C  sertraline (ZOLOFT) 25 MG tablet Take 25 mg by mouth daily. 12/19/20  Yes [provider]  pyridOXINE (B-6) 50 MG tablet Take 1 tablet by mouth. Patient not taking: No sig reported    [provider]     ALLERGIES:  Allergies  Allergen Reactions   No Known Allergies      SOCIAL HISTORY:  Social History   Socioeconomic History   Marital status: Married    Spouse name: Not on file   Number of children: Not on file   Years of education: Not on file   Highest education  level: Not on file  Occupational History   Not on file  Tobacco Use   Smoking status: Current Every Day Smoker    Packs/day: 0.50    Years: 15.00    Pack years: 7.50    Types: Cigarettes   Smokeless tobacco: Never Used  Vaping Use   Vaping Use: Never used  Substance and Sexual Activity   Alcohol use: Yes    Alcohol/week: 18.0 standard drinks    Types: 18 Shots of liquor per week    Comment: Regular 2-3 scotch/day   Drug use: No   Sexual activity: Not on file  Other Topics Concern   Not on file  Social History Narrative   Caffeine: 5-6 cups coffee/day   Lives with wife Sydell Axon), son and step son, no pets   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)   Edu: Management consultant   Activity: works outside   Diet: good water, daily fruits/vegetables   Social Determinants of Radio broadcast assistant Strain: Not on Art therapist Insecurity: Not on Pensions consultant Needs: Not on file  Physical Activity: Not on file  Stress: Not on file  Social Connections: Not on file  Intimate Partner Violence: Not on file     FAMILY HISTORY:  Family History  Problem Relation Age of Onset   Hypertension Mother    Coronary artery disease Mother        angina   Cancer Mother        breast   Cancer Father 35       esophageal   COPD Brother        emphysema   Cancer Paternal Grandmother        colon   Diabetes Neg Hx       REVIEW OF SYSTEMS:  Review of Systems  Constitutional: Positive for malaise/fatigue. Negative for chills and fever.  HENT: Negative for congestion and sore throat.   Respiratory: Negative for cough and shortness of breath.   Cardiovascular: Negative for chest pain and palpitations.  Gastrointestinal: Positive for abdominal pain, constipation, nausea and vomiting. Negative for blood in stool and diarrhea.  Genitourinary: Negative for dysuria and urgency.  Neurological: Positive for weakness. Negative for dizziness, sensory change,  speech change and headaches.  All other systems reviewed and are negative.   VITAL SIGNS:  Temp:  [99.2 F (37.3 C)] 99.2 F (37.3 C) (04/08 0507) Pulse Rate:  [99-116] 102 (04/08 0700) Resp:  [17-22] 20 (04/08 0700) BP: (122-153)/(67-87) 122/67 (04/08 0645) SpO2:  [88 %-96 %] 95 % (04/08 0700) Weight:  [77.1 kg] 77.1 kg (04/08 0457)     Height: 5\' 7"  (170.2 cm) Weight: 77.1 kg BMI (Calculated): 26.62   INTAKE/OUTPUT:  No intake/output data recorded.  PHYSICAL EXAM:  Physical Exam Vitals and nursing note reviewed. Exam conducted with a chaperone present.  Constitutional:      General: He is not in acute distress.    Appearance: He is normal weight.     Comments: Patient appears very frail and weak, wife at bedside  HENT:     Head: Normocephalic and atraumatic.     Mouth/Throat:     Mouth: Mucous membranes are dry.     Pharynx: Oropharynx is clear.  Eyes:     General: No scleral icterus.    Conjunctiva/sclera: Conjunctivae normal.  Cardiovascular:     Rate and Rhythm: Normal rate.     Pulses: Normal pulses.  Pulmonary:     Effort: Pulmonary effort is normal. No respiratory distress.     Comments: On [place] Abdominal:     General: A surgical scar is present. The ostomy site is clean. There is no distension.     Palpations: Abdomen is soft.     Tenderness: There is no abdominal tenderness. There is no guarding or rebound.     Comments: Abdomen is soft, non-tender, non-distended, no rebound/guarding. Colostomy in LLQ with stool in bag  Genitourinary:    Comments: Deferred Musculoskeletal:     Right lower leg: No edema.     Left lower leg: No edema.  Skin:    General: Skin is warm and dry.     Coloration: Skin is not pale.     Findings: No erythema.  Neurological:     General: No focal deficit present.     Mental Status: He is alert.     Cranial Nerves: Cranial nerves are intact.     Sensory: Sensation is intact.     Coordination: Coordination abnormal.  Finger-Nose-Finger Test abnormal.     Comments: CN II-XII appear grossly intact, strength in upper and lower extremities appear 5/5, sensation appears intact, he does have difficulty with finger-to-nose  Psychiatric:        Mood and Affect: Mood normal.        Behavior: Behavior normal.      Labs:  CBC Latest Ref Rng & Units 02/19/2021 11/16/2020 05/04/2020  WBC 4.0 - 10.5 K/uL 21.6(H) 8.5 6.7  Hemoglobin 13.0 - 17.0 g/dL 15.4 13.7 13.9  Hematocrit 39.0 - 52.0 % 44.0 37.5(L) 37.5(L)  Platelets 150 - 400 K/uL 309 264 206   CMP Latest Ref Rng & Units 02/19/2021 11/16/2020 05/04/2020  Glucose 70 - 99 mg/dL 146(H) 112(H) 110(H)  BUN 8 - 23 mg/dL 13 7(L) 7(L)  Creatinine 0.61 - 1.24 mg/dL 0.80 0.90 0.99  Sodium 135 - 145 mmol/L 132(L) 134(L) 134(L)  Potassium 3.5 - 5.1 mmol/L 4.5 3.7 3.4(L)  Chloride 98 - 111 mmol/L 98 99 95(L)  CO2 22 - 32 mmol/L 21(L) 23 27  Calcium 8.9 - 10.3 mg/dL 9.7 8.8(L) 8.3(L)  Total Protein 6.5 - 8.1 g/dL 7.8 6.9 6.5  Total Bilirubin 0.3 - 1.2 mg/dL 1.4(H) 1.0 1.4(H)  Alkaline Phos 38 - 126 U/L 96 104 92  AST 15 - 41 U/L 56(H) 31 48(H)  ALT 0 - 44 U/L 21 13 19      Imaging studies:   CT Abdomen/Pelvis (02/19/2021) personally reviewed which shows mild small bowel dilation without obvious transition, s/p colectomy and colostomy, otherwise relatively unremarkable, and radiologist report reviewed:  IMPRESSION: 1. Partial small bowel obstruction without focal transition point. 2. Small volume of reactive ascites in the right pelvis in the region of dilated and fluid-filled small  bowel. 3. Bilateral lower lobe atelectasis versus small volume aspiration superimposed on a background of diffuse chronic bronchial wall thickening. 4. Mild circumferential thickening of the distal esophagus concerning for esophagitis. Does the patient have a clinical history of GERD? 5. Cholelithiasis. 6. Surgical changes of prior low anterior resection with left lower quadrant  colostomy. 7.  Aortic Atherosclerosis (ICD10-170.0) 8. L4 and L5 compression fractures are new compared to prior imaging from June of 2021 and have likely occurred at some point in the interim. No findings on today's examination to suggest acute fractures.   Assessment/Plan: (ICD-10's: K69.609) 75 y.o. male who presents with 1 week of abdominal discomfort and constipation, now having bowel function, thought to have possible SBO. SBO is certainly possible given his previous intra-abdominal surgeries,; however, I have low clinical suspicion for this given his benign examination, lack of gross transition point on CT, and return of ostomy function. I suspect he may have had mild ileus secondary to acute medical illness (PNA vs UTI vs both).    - Given his return of bowel function, I do think we can initiate CLD. May benefit from SLP evaluation before advancing further, given concern for aspiration  - No role for NGT at this time. No indications for surgical intervention  - ? Role for Head CT given coordination changes ? Not sure if this will add much given symptoms onset >2 weeks ago, but could potentially identify brain metastases, if that is a concern.  - May benefit from palliative care discussion; Patient appears very weak/debilitated   - Agree with IV ABx for aspiration vs UTI  - Monitor abdominal examination; on-going colostomy function  - Pain control prn; antiemetics prn   - Further management per primary service; we will sign off.  All of the above findings and recommendations were discussed with the patient and his wife at bedside, and all of their questions were answered to their expressed satisfaction.  Thank you for the opportunity to participate in this patient's care.   -- Edison Simon, PA-C [facility] 02/19/2021, 8:15 AM [phone] M-F: 7am - 4pm  I saw and evaluated the patient.  I agree with the above documentation, exam, and plan, which I have edited  where appropriate. Fredirick Maudlin  2:58 PM

## 2021-02-19 NOTE — ED Provider Notes (Signed)
[facility] Emergency Department Provider Note   ____________________________________________   Event Date/Time   First MD Initiated Contact with Patient 02/19/21 0451     (approximate)  I have reviewed the triage vital signs and the nursing notes.   HISTORY  Chief Complaint Emesis and Constipation    HPI Stephen [name] is a 75 y.o. male brought to the ED via EMS from home with a chief complaint of abdominal pain, nausea and vomiting.  Patient with a history of rectal cancer status post colon resection in 2015 with colostomy who has been constipated for 1 week.  States zero stool output via the colostomy.  Patient has taken MiraLAX at home for the past 2 days without relief of symptoms.  Presents tonight to due to nausea/vomiting after drinking Ensure.  Complains of generalized weakness and poor intake over the past several days.  Patient does not routinely wear continuous oxygen; EMS reports room air saturations upper 80%'s upon their arrival thus patient was placed on 3 L nasal cannula.  Denies fever, chest pain, urinary retention, diarrhea.     Past Medical History:  Diagnosis Date  . Allergic rhinitis   . Blood clot in vein 2015   left leg   . History of anemia   . History of chicken pox   . HTN (hypertension)   . Neuropathy    arms and legs - S/P Cancer tx  . Rectal cancer metastasized to lung [facility]) 09-2013   surgery and chemo  . Smoker   SBO 2015  Patient Active Problem List   Diagnosis Date Noted  . Aspiration into lower respiratory tract, sequela 02/19/2021  . Rectal cancer ([place]) 11/15/2013  . History of anemia   . HTN (hypertension)   . Smoker     Past Surgical History:  Procedure Laterality Date  . COLON RESECTION  2015  . COLONOSCOPY  2015  . EUS N/A 11/15/2013   Procedure: LOWER ENDOSCOPIC ULTRASOUND (EUS);  Surgeon: Milus Banister, MD;  Location: [facility];  Service: Endoscopy;  Laterality: N/A;  . LUNG BIOPSY    . LUNG  LOBECTOMY Left   . lung removed  2015   left lower   . PLANTAR FASCIA RELEASE Right 09/20/2017   Procedure: PLANTAR FASCIA [id number], excision plantar fibroma right foot;  Surgeon: Samara Deist, DPM;  Location: [place];  Service: Podiatry;  Laterality: Right;  . TONSILLECTOMY AND ADENOIDECTOMY  Childhood  . VARICOSE VEIN SURGERY  2010    Prior to Admission medications   Medication Sig Start Date End Date Taking? Authorizing Provider  aspirin EC 81 MG tablet Take 81 mg by mouth daily.    [provider]  bisacodyl (DULCOLAX) 5 MG EC tablet Take 5 mg by mouth daily as needed for moderate constipation.    [provider]  esomeprazole (NEXIUM) 20 MG packet Take 20 mg by mouth daily before breakfast.    [provider]  folic acid (FOLVITE) 1 MG tablet Take 1 tablet by mouth 1 day or 1 dose. 04/23/19   [provider]  loratadine (CLARITIN) 10 MG tablet Take 10 mg by mouth daily as needed.    [provider]  olmesartan (BENICAR) 20 MG tablet Take 20 mg by mouth daily.    [provider]  oxyCODONE-acetaminophen (PERCOCET) 7.5-325 MG tablet Take 1 tablet by mouth every 6 (six) hours as needed for severe pain. 06/24/20   Sable Feil, PA-C  pyridOXINE (B-6) 50 MG tablet  Take 1 tablet by mouth.    [provider]    Allergies No known allergies  Family History  Problem Relation Age of Onset  . Hypertension Mother   . Coronary artery disease Mother        angina  . Cancer Mother        breast  . Cancer Father 84       esophageal  . COPD Brother        emphysema  . Cancer Paternal Grandmother        colon  . Diabetes Neg Hx     Social History Social History   Tobacco Use  . Smoking status: Current Every Day Smoker    Packs/day: 0.50    Years: 15.00    Pack years: 7.50    Types: Cigarettes  . Smokeless tobacco: Never Used  Vaping Use  . Vaping Use: Never used  Substance Use Topics  . Alcohol use:  Yes    Alcohol/week: 18.0 standard drinks    Types: 18 Shots of liquor per week    Comment: Regular 2-3 scotch/day  . Drug use: No    Review of Systems  Constitutional: Positive for decreased oral intake and generalized weakness no fever/chills Eyes: No visual changes. ENT: No sore throat. Cardiovascular: Denies chest pain. Respiratory: Denies shortness of breath. Gastrointestinal: Positive for abdominal pain, nausea and vomiting.  No diarrhea.  No constipation. Genitourinary: Negative for dysuria. Musculoskeletal: Negative for back pain. Skin: Negative for rash. Neurological: Negative for headaches, focal weakness or numbness.   ____________________________________________   PHYSICAL EXAM:  VITAL SIGNS: ED Triage Vitals  Enc Vitals Group     BP      Pulse      Resp      Temp      Temp src      SpO2      Weight      Height      Head Circumference      Peak Flow      Pain Score      Pain Loc      Pain Edu?      Excl. in [place]?     Constitutional: Alert and oriented.  Elderly appearing and in mild acute distress. Eyes: Conjunctivae are normal. PERRL. EOMI. Head: Atraumatic. Nose: No congestion/rhinnorhea. Mouth/Throat: Mucous membranes are mildly dry. Neck: No stridor.   Cardiovascular: Normal rate, regular rhythm. Grossly normal heart sounds.  Good peripheral circulation. Respiratory: Normal respiratory effort.  No retractions. Lungs CTAB. Gastrointestinal: Soft with mild diffuse tenderness to palpation without rebound or guarding.  No stool in ostomy bag.. No distention. No abdominal bruits. No CVA tenderness. Musculoskeletal: No lower extremity tenderness nor edema.  No joint effusions. Neurologic:  Normal speech and language. No gross focal neurologic deficits are appreciated.  Skin:  Skin is warm, dry and intact. No rash noted. Psychiatric: Mood and affect are normal. Speech and behavior are normal.  ____________________________________________    LABS (all labs ordered are listed, but only abnormal results are displayed)  Labs Reviewed  CBC WITH DIFFERENTIAL/PLATELET - Abnormal; Notable for the following components:      Result Value   WBC 21.6 (*)    MCV 100.5 (*)    MCH 35.2 (*)    Neutro Abs 20.0 (*)    Lymphs Abs 0.6 (*)    Abs Immature Granulocytes 0.10 (*)    All other components within normal limits  COMPREHENSIVE METABOLIC PANEL - Abnormal; Notable for  the following components:   Sodium 132 (*)    CO2 21 (*)    Glucose, Bld 146 (*)    Albumin 3.4 (*)    AST 56 (*)    Total Bilirubin 1.4 (*)    All other components within normal limits  URINALYSIS, COMPLETE (UACMP) WITH MICROSCOPIC - Abnormal; Notable for the following components:   Color, Urine AMBER (*)    APPearance CLOUDY (*)    Bilirubin Urine SMALL (*)    Protein, ur 30 (*)    Leukocytes,Ua LARGE (*)    WBC, UA >50 (*)    Bacteria, UA RARE (*)    All other components within normal limits  LACTIC ACID, PLASMA - Abnormal; Notable for the following components:   Lactic Acid, Venous 3.1 (*)    All other components within normal limits  RESP PANEL BY RT-PCR (FLU A&B, COVID) ARPGX2  CULTURE, BLOOD (ROUTINE X 2)  CULTURE, BLOOD (ROUTINE X 2)  LIPASE, BLOOD  LACTIC ACID, PLASMA  PROCALCITONIN  BRAIN NATRIURETIC PEPTIDE  TROPONIN I (HIGH SENSITIVITY)  TROPONIN I (HIGH SENSITIVITY)   ____________________________________________  EKG  ED ECG REPORT I, Jowan Skillin J, the attending physician, personally viewed and interpreted this ECG.   Date: 02/19/2021  EKG Time: 0515  Rate: 116  Rhythm: sinus tachycardia  Axis: LAD  Intervals:left anterior fascicular block  ST&T Change: Nonspecific  ____________________________________________  RADIOLOGY Cecilie Kicks J, personally viewed and evaluated these images (plain radiographs) as part of my medical decision making, as well as reviewing the written report by the radiologist.  ED MD interpretation:  Right-sided opacity concerning for aspiration  Official radiology report(s): DG Chest Port 1 View  Result Date: 02/19/2021 CLINICAL DATA:  Hypoxia.  Constipation for 1 week with emesis. EXAM: PORTABLE CHEST 1 VIEW COMPARISON:  Chest CT 11/16/2020. FINDINGS: Interstitial and hazy airspace opacity asymmetric to the right base. Some more rounded density at the medial right base but no nodule was seen in this area on recent chest CT. No pleural effusion or pneumothorax. Normal heart size and mediastinal contours. IMPRESSION: Hazy pulmonary opacity with asymmetric right-sided involvement more concerning for pneumonia or aspiration. Electronically Signed   By: Monte Fantasia M.D.   On: 02/19/2021 05:17    ____________________________________________   PROCEDURES  Procedure(s) performed (including Critical Care):  .1-3 Lead EKG Interpretation Performed by: Paulette Blanch, MD Authorized by: Paulette Blanch, MD     Interpretation: abnormal     ECG rate:  116   ECG rate assessment: tachycardic     Rhythm: sinus tachycardia     Ectopy: none     Conduction: normal   Comments:     Patient placed on cardiac monitor to evaluate for arrhythmias    CRITICAL CARE Performed by: Paulette Blanch   Total critical care time: 45 minutes  Critical care time was exclusive of separately billable procedures and treating other patients.  Critical care was necessary to treat or prevent imminent or life-threatening deterioration.  Critical care was time spent personally by me on the following activities: development of treatment plan with patient and/or surrogate as well as nursing, discussions with consultants, evaluation of patient's response to treatment, examination of patient, obtaining history from patient or surrogate, ordering and performing treatments and interventions, ordering and review of laboratory studies, ordering and review of radiographic studies, pulse oximetry and re-evaluation of patient's  condition.  ____________________________________________   INITIAL IMPRESSION / ASSESSMENT AND PLAN / ED COURSE  As part of my medical  decision making, I reviewed the following data within the [facility] notes reviewed and incorporated, Labs reviewed, EKG interpreted, Old chart reviewed, Radiograph reviewed and Notes from prior ED visits     74 year old male with history of colectomy status post colostomy presenting with abdominal pain, nausea/vomiting with zero stool output x1 week. Differential diagnosis includes, but is not limited to, acute appendicitis, renal colic, testicular torsion, urinary tract infection/pyelonephritis, prostatitis,  epididymitis, diverticulitis, small bowel obstruction or ileus, colitis, abdominal aortic aneurysm, gastroenteritis, hernia, etc.  Will obtain lab work, CT abdomen/pelvis.  Obtain portable chest x-ray for hypoxia on room air, suspect aspiration.  Will reassess  Clinical Course as of 02/19/21 0647  Fri Feb 19, 2021  3354 Oral temp 99.2 F.  Given tachycardia, tachypnea and hypoxia, concern for SIRS.  Will check blood cultures, lactic acid, procalcitonin. [JS]  5625 WBC and chest x-ray noted.  Appreciate pharmacy consult who recommends IV Zosyn for antibiotic coverage for both aspiration and intra-abdominal pathogens. [JS]  0609 Lactic acid noted.  IV fluids infusing.  Sepsis reassessment -tachycardia improved, tachypnea improved, remains hemodynamically stable.  Saturations 96% on nasal cannula oxygen. [JS]  [phone] Updated patient and spouse on all laboratory results.  CT abdomen/pelvis is pending.  Have discussed with hospitalist services for admission. [JS]    Clinical Course User Index [JS] Paulette Blanch, MD     ____________________________________________   FINAL CLINICAL IMPRESSION(S) / ED DIAGNOSES  Final diagnoses:  Generalized abdominal pain  Non-intractable vomiting with nausea, unspecified vomiting type   Constipation, unspecified constipation type  Aspiration pneumonia of right lung, unspecified aspiration pneumonia type, unspecified part of lung ([place])  Hypoxia  Sepsis, due to unspecified organism, unspecified whether acute organ dysfunction present [facility])  Lower urinary tract infectious disease     ED Discharge Orders    None      *Please note:  Deitrich Steve was evaluated in Emergency Department on 02/19/2021 for the symptoms described in the history of present illness. He was evaluated in the context of the global COVID-19 pandemic, which necessitated consideration that the patient might be at risk for infection with the SARS-CoV-2 virus that causes COVID-19. Institutional protocols and algorithms that pertain to the evaluation of patients at risk for COVID-19 are in a state of rapid change based on information released by regulatory bodies including the CDC and federal and state organizations. These policies and algorithms were followed during the patient's care in the ED.  Some ED evaluations and interventions may be delayed as a result of limited staffing during and the pandemic.*   Note:  This document was prepared using Dragon voice recognition software and may include unintentional dictation errors.   Paulette Blanch, MD 02/19/21 [phone]

## 2021-02-19 NOTE — Progress Notes (Signed)
CODE SEPSIS - PHARMACY COMMUNICATION  **Broad Spectrum Antibiotics should be administered within 1 hour of Sepsis diagnosis**  Time Code Sepsis Called/Page Received: 4600  Antibiotics Ordered: Zosyn  Time of 1st antibiotic administration: 2984  Renda Rolls, PharmD, [facility] 02/19/2021 5:50 AM

## 2021-02-19 NOTE — ED Notes (Signed)
Informed RN bed assigned 1026

## 2021-02-19 NOTE — ED Triage Notes (Signed)
Pt arrived via [facility] with c/o constipation x 1 week. Pt taken miralax at home x 2 days w/out relief. Pt emesis starting this morning after drinking ensure. poor po intake last several days. oxygen sat was upper 80's with ems placed on 3L [place]. NAD on arrival. MD present for assessment   BP- 150/88 161 cbg HR 110

## 2021-02-20 DIAGNOSIS — E440 Moderate protein-calorie malnutrition: Secondary | ICD-10-CM | POA: Diagnosis present

## 2021-02-20 DIAGNOSIS — R6520 Severe sepsis without septic shock: Secondary | ICD-10-CM

## 2021-02-20 DIAGNOSIS — F10239 Alcohol dependence with withdrawal, unspecified: Secondary | ICD-10-CM | POA: Diagnosis not present

## 2021-02-20 DIAGNOSIS — A419 Sepsis, unspecified organism: Principal | ICD-10-CM

## 2021-02-20 DIAGNOSIS — F10939 Alcohol use, unspecified with withdrawal, unspecified: Secondary | ICD-10-CM | POA: Diagnosis not present

## 2021-02-20 DIAGNOSIS — N3000 Acute cystitis without hematuria: Secondary | ICD-10-CM

## 2021-02-20 LAB — CBC
HCT: 33.2 % — ABNORMAL LOW (ref 39.0–52.0)
Hemoglobin: 11.6 g/dL — ABNORMAL LOW (ref 13.0–17.0)
MCH: 36.4 pg — ABNORMAL HIGH (ref 26.0–34.0)
MCHC: 34.9 g/dL (ref 30.0–36.0)
MCV: 104.1 fL — ABNORMAL HIGH (ref 80.0–100.0)
Platelets: 170 10*3/uL (ref 150–400)
RBC: 3.19 MIL/uL — ABNORMAL LOW (ref 4.22–5.81)
RDW: 13.5 % (ref 11.5–15.5)
WBC: 20 10*3/uL — ABNORMAL HIGH (ref 4.0–10.5)
nRBC: 0 % (ref 0.0–0.2)

## 2021-02-20 LAB — BASIC METABOLIC PANEL
Anion gap: 9 (ref 5–15)
BUN: 15 mg/dL (ref 8–23)
CO2: 22 mmol/L (ref 22–32)
Calcium: 8.5 mg/dL — ABNORMAL LOW (ref 8.9–10.3)
Chloride: 104 mmol/L (ref 98–111)
Creatinine, Ser: 0.75 mg/dL (ref 0.61–1.24)
GFR, Estimated: >60 mL/min (ref >60)
Glucose, Bld: 81 mg/dL (ref 70–99)
Potassium: 3.3 mmol/L — ABNORMAL LOW (ref 3.5–5.1)
Sodium: 135 mmol/L (ref 135–145)

## 2021-02-20 LAB — BLOOD GAS, VENOUS
Acid-base deficit: 2.4 mmol/L — ABNORMAL HIGH (ref 0.0–2.0)
Bicarbonate: 24.6 mmol/L (ref 20.0–28.0)
O2 Saturation: 42.5 %
Patient temperature: 37
pCO2, Ven: 50 mmHg (ref 44.0–60.0)
pH, Ven: 7.3 (ref 7.250–7.430)
pO2, Ven: <31 mmHg — CL (ref 32.0–45.0)

## 2021-02-20 LAB — GLUCOSE, CAPILLARY: Glucose-Capillary: 125 mg/dL — ABNORMAL HIGH (ref 70–99)

## 2021-02-20 LAB — MRSA PCR SCREENING: MRSA by PCR: NEGATIVE

## 2021-02-20 MED ORDER — DEXMEDETOMIDINE HCL IN NACL 200 MCG/50ML IV SOLN
0.2000 ug/kg/h | INTRAVENOUS | Status: DC
  Administered 2021-02-20: 0.4 ug/kg/h via INTRAVENOUS

## 2021-02-20 MED ORDER — HYDRALAZINE HCL 20 MG/ML IJ SOLN: 10.0000 mg | Freq: Four times a day (QID) | INTRAMUSCULAR | Status: DC | PRN

## 2021-02-20 MED ORDER — ACETAMINOPHEN 10 MG/ML IV SOLN
1000.0000 mg | Freq: Four times a day (QID) | INTRAVENOUS | Status: AC | PRN
  Administered 2021-02-20: 16:00:00 1000 mg via INTRAVENOUS
  Filled 2021-02-20: qty 100

## 2021-02-20 MED ORDER — SODIUM CHLORIDE 0.9 % IV SOLN
INTRAVENOUS | Status: DC | PRN
  Administered 2021-02-20 – 2021-02-21 (×2): 250 mL via INTRAVENOUS

## 2021-02-20 MED ORDER — PANTOPRAZOLE SODIUM 40 MG PO TBEC
40.0000 mg | DELAYED_RELEASE_TABLET | Freq: Every day | ORAL | Status: DC
  Administered 2021-02-20 – 2021-02-24 (×5): 40 mg via ORAL
  Filled 2021-02-20 (×5): qty 1

## 2021-02-20 MED ORDER — SODIUM CHLORIDE 0.9 % IV BOLUS
250.0000 mL | Freq: Once | INTRAVENOUS | Status: AC
  Administered 2021-02-20: 250 mL via INTRAVENOUS

## 2021-02-20 MED ORDER — POTASSIUM CHLORIDE CRYS ER 20 MEQ PO TBCR
40.0000 meq | EXTENDED_RELEASE_TABLET | Freq: Once | ORAL | Status: AC
  Administered 2021-02-20: 15:00:00 40 meq via ORAL
  Filled 2021-02-20: qty 2

## 2021-02-20 MED ORDER — CHLORHEXIDINE GLUCONATE CLOTH 2 % EX PADS
6.0000 | MEDICATED_PAD | Freq: Every day | CUTANEOUS | Status: DC
  Administered 2021-02-20 – 2021-02-22 (×3): 6 via TOPICAL

## 2021-02-20 MED ORDER — FUROSEMIDE 10 MG/ML IJ SOLN
40.0000 mg | Freq: Once | INTRAMUSCULAR | Status: AC
  Administered 2021-02-20: 40 mg via INTRAVENOUS
  Filled 2021-02-20: qty 4

## 2021-02-20 NOTE — Progress Notes (Signed)
Pt and belongings transferred to ICU (see previous note for event details) Wife Sydell Axon [phone] called twice voicemail full and unable to leave updated message about patient transfer.

## 2021-02-20 NOTE — Progress Notes (Addendum)
PROGRESS NOTE    Stephen [name]   XTG:[id number]  DOB: 07/03/1946  PCP: Stephen Jude, MD    DOA: 02/19/2021 LOS: 1   Brief Narrative   Stephen [name] is a 75 y.o. 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  He was found to have a partial small bowel obstruction on CT abdomen/pelvis without a focal transition point.  Labs showed marked leukocytosis 21.6k, lactic acidosis 3.1, negative for Covid-19.  UA was consistent with infection.  Chest xray concerning for PNA with Right basilar opacity.  Admitted to hospitalist service.  General surgery consulted.  4/9 AM - pt reported having BM after getting Miralax.  Abdominal symptoms better.  Endorses some dysuria.  Denies respiratory symptoms.      Assessment & Plan   Principal Problem:   Severe sepsis ([place]) Active Problems:   UTI (urinary tract infection)   Acute respiratory failure with hypoxia (HCC)   Aspiration pneumonia (HCC)   Partial small bowel obstruction (HCC)   Alcohol withdrawal syndrome (HCC)   HTN (hypertension)   Alcohol use   Malnutrition of moderate degree   Smoker   Rectal cancer (HCC)   GERD (gastroesophageal reflux disease)   Depression  ADDENDUM PM Alcohol use disorder with  Acute alcohol withdrawal syndrome 4/9 afternoon-early evening pt febrile, tachycardic, tachypneic, hallucinating, high CIWA scores. Wife reports at least six liquor/whiskey drink daily, thinks more recently.  She does not know of prior withdrawal symptoms if he stops drinking. --Continue CIWA protocol  --Transfer to stepdown unit --Precedex drip if CIWA's remain high despite Ativan   Partial SBO -present on  admission.  Resolved, return of bowel function after Miralax. General surgery consulted, signed off. NG tube was not indicated, nor surgical intervention.  Severe sepsis due to UTI and pneumonia -sepsis present on admission with leukocytosis, tachycardia, tachypnea.  Lactic acidosis consistent with severe sepsis.  Treated per sepsis protocol in the ED.  Hemodynamically stable. --Continue empiric Zosyn for now --Off IV fluids --Follow blood, urine and sputum cultures  UTI -present admission. UA with large leukocytes, rare bacteria, >50 WBCs. --On Zosyn --Urine culture growing Enterococcus faecalis --Follow-up culture susceptibilities  Community-acquired pneumonia versus aspiration -present on admission. Chest x-ray showed hazy pulmonary opacity with asymmetric right-sided involvement more concerning for pneumonia or aspiration. Evaluated by SLP, deemed mild aspiration risk, regular diet consistency recommended with general aspiration and reflux precautions. Negative for COVID-19 and influenza A and B. --Continue empiric Zosyn --Follow-up sputum culture --Supportive care: Mucinex, albuterol, incentive spirometer, flutter valve  Acute respiratory failure with hypoxia -patient presented with tachypnea, x-ray findings concerning for pneumonia, O2 saturation 88% on room air.  Oxygenation proved on 2 L/min nasal cannula oxygen. 4/9: Weaned to room air with sats 91 to 93% --Monitor closely --Supplemental oxygen as needed to maintain sats greater than 90%  Hypokalemia - K 3.3 on 4/9, replaced.  Monitor BMP and replace as needed.    Essential hypertension -chronic, stable.  Home Benicar is held due to sepsis and risk of hypotension. 4/9: BP normal with meds held --Continue home Benicar monitor BP, resume when indicated --As needed hydralazine  Tobacco abuse -nicotine patch ordered.    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  Reportedly  finished chemotherapy in  2016. Currently no acute issues. --Palliative care consulted for goals of care discussion  GERD -continue Protonix  Depression -continue Zoloft  Lumbar compression fracture -seen on CT abdomen pelvis, L4 and L5 compression fractures new when compared To imaging from June 2021.  These have occurred in the interim.  No acute fractures seen. --Continue home Tylenol and as needed Percocet    Patient BMI: Body mass index is 26.17 kg/m.   DVT prophylaxis: heparin injection 5,000 Units Start: 02/19/21 1400   Diet:  Diet Orders (From admission, onward)    Start     Ordered   02/19/21 1311  Diet clear liquid Room service appropriate? Yes; Fluid consistency: Thin  Diet effective now       Comments: AS TOLERATED  Question Answer Comment  Room service appropriate? Yes   Fluid consistency: Thin      02/19/21 1310            Code Status: Full Code    Subjective 02/20/21    Patient up in chair when seen today.  Reports large amount of stool after MiraLAX.  Denies fevers or chills.  Does endorse some burning with urination.  Denies cough or shortness of breath or chest pain.  No other acute complaints.  Asks if he can go home tomorrow.   Disposition Plan & Communication   Status is: Inpatient  Inpatient status remains appropriate due to severity of illness: Severe sepsis with cultures pending on empiric antibiotics.  Dispo: The patient is from: Home              Anticipated d/c is to: Home              Patient currently not medically stable for discharge   Difficult to place patient no  Family communication: updated wife by phone around 1745 this evening regarding stepdown transfer and patient in alcohol withdrawal.   Consults, Procedures, Significant Events   Consultants:   General surgery  Procedures:   None  Antimicrobials:  Anti-infectives (From admission, onward)   Start     Dose/Rate Route Frequency Ordered Stop   02/19/21 1400  piperacillin-tazobactam  (ZOSYN) IVPB 3.375 g        3.375 g 12.5 mL/hr over 240 Minutes Intravenous Every 8 hours 02/19/21 0728     02/19/21 0600  piperacillin-tazobactam (ZOSYN) IVPB 3.375 g        3.375 g 100 mL/hr over 30 Minutes Intravenous  Once 02/19/21 0545 02/19/21 0728        Micro    Objective   Vitals:   02/20/21 1719 02/20/21 1804 02/20/21 1850 02/20/21 1851  BP: 101/84 140/87 (!) 89/62 (!) 89/62  Pulse: (!) 106  99 100  Resp: (!) 26 (!) 26 (!) 21 (!) 24  Temp: (!) 101.7 F (38.7 C) 100.1 F (37.8 C)    TempSrc: Axillary Oral    SpO2: (!) 88% 95% 93% 93%  Weight:  75.8 kg    Height:        Intake/Output Summary (Last 24 hours) at 02/20/2021 1902 Last data filed at 02/20/2021 1446 Gross per 24 hour  Intake 920.57 ml  Output --  Net 920.57 ml   Filed Weights   02/19/21 0457 02/20/21 1804  Weight: 77.1 kg 75.8 kg    Physical Exam:  General exam: awake, alert, no acute distress HEENT: moist mucus membranes, hearing grossly normal  Respiratory system: diminished right base otherwise clear lung sounds, no wheezes, normal respiratory  effor, on room air. Cardiovascular system: normal S1/S2, RRR, no pedal edema.   Gastrointestinal system: soft, NT, ND, no HSM felt, +bowel sounds, colostomy present . Central nervous system: A&O x4. no gross focal neurologic deficits, normal speech Extremities: moves all, no edema, normal tone Skin: dry, intact, normal temperature, No rashes seen on visualized skin Psychiatry: normal mood, congruent affect, judgement and insight appear normal  Labs   Data Reviewed: I have personally reviewed following labs and imaging studies  CBC: Recent Labs  Lab 02/19/21 0506 02/20/21 0412  WBC 21.6* 20.0*  NEUTROABS 20.0*  --   HGB 15.4 11.6*  HCT 44.0 33.2*  MCV 100.5* 104.1*  PLT 309 503   Basic Metabolic Panel: Recent Labs  Lab 02/19/21 0506 02/20/21 0412  NA 132* 135  K 4.5 3.3*  CL 98 104  CO2 21* 22  GLUCOSE 146* 81  BUN 13 15  CREATININE  0.80 0.75  CALCIUM 9.7 8.5*   GFR: Estimated Creatinine Clearance: 75.7 mL/min (by C-G formula based on SCr of 0.75 mg/dL). Liver Function Tests: Recent Labs  Lab 02/19/21 0506  AST 56*  ALT 21  ALKPHOS 96  BILITOT 1.4*  PROT 7.8  ALBUMIN 3.4*   Recent Labs  Lab 02/19/21 0506  LIPASE 29   No results for input(s): AMMONIA in the last 168 hours. Coagulation Profile: No results for input(s): INR, PROTIME in the last 168 hours. Cardiac Enzymes: No results for input(s): CKTOTAL, CKMB, CKMBINDEX, TROPONINI in the last 168 hours. BNP (last 3 results) No results for input(s): PROBNP in the last 8760 hours. HbA1C: No results for input(s): HGBA1C in the last 72 hours. CBG: Recent Labs  Lab 02/20/21 1803  GLUCAP 125*   Lipid Profile: No results for input(s): CHOL, HDL, LDLCALC, TRIG, CHOLHDL, LDLDIRECT in the last 72 hours. Thyroid Function Tests: No results for input(s): TSH, T4TOTAL, FREET4, T3FREE, THYROIDAB in the last 72 hours. Anemia Panel: No results for input(s): VITAMINB12, FOLATE, FERRITIN, TIBC, IRON, RETICCTPCT in the last 72 hours. Sepsis Labs: Recent Labs  Lab 02/19/21 0509 02/19/21 0730 02/19/21 1202  PROCALCITON  --   --  1.50  LATICACIDVEN 3.1* 1.7  --     Recent Results (from the past 240 hour(s))  Resp Panel by RT-PCR (Flu A&B, Covid) Nasopharyngeal Swab     Status: None   Collection Time: 02/19/21  5:06 AM   Specimen: Nasopharyngeal Swab; Nasopharyngeal(NP) swabs in vial transport medium  Result Value Ref Range Status   SARS Coronavirus 2 by RT PCR NEGATIVE NEGATIVE Final    Comment: (NOTE) SARS-CoV-2 target nucleic acids are NOT DETECTED.  The SARS-CoV-2 RNA is generally detectable in upper respiratory specimens during the acute phase of infection. The lowest concentration of SARS-CoV-2 viral copies this assay can detect is 138 copies/mL. A negative result does not preclude SARS-Cov-2 infection and should not be used as the sole basis for  treatment or other patient management decisions. A negative result may occur with  improper specimen collection/handling, submission of specimen other than nasopharyngeal swab, presence of viral mutation(s) within the areas targeted by this assay, and inadequate number of viral copies(<138 copies/mL). A negative result must be combined with clinical observations, patient history, and epidemiological information. The expected result is Negative.  Fact Sheet for Patients:  EntrepreneurPulse.com.au  Fact Sheet for Healthcare Providers:  IncredibleEmployment.be  This test is no t yet approved or cleared by the [place] FDA and  has been authorized for detection and/or diagnosis of SARS-CoV-2  by FDA under an Emergency Use Authorization (EUA). This EUA will remain  in effect (meaning this test can be used) for the duration of the COVID-19 declaration under Section 564(b)(1) of the Act, 21 U.S.C.section 360bbb-3(b)(1), unless the authorization is terminated  or revoked sooner.       Influenza A by PCR NEGATIVE NEGATIVE Final   Influenza B by PCR NEGATIVE NEGATIVE Final    Comment: (NOTE) The Xpert Xpress SARS-CoV-2/FLU/RSV plus assay is intended as an aid in the diagnosis of influenza from Nasopharyngeal swab specimens and should not be used as a sole basis for treatment. Nasal washings and aspirates are unacceptable for Xpert Xpress SARS-CoV-2/FLU/RSV testing.  Fact Sheet for Patients: EntrepreneurPulse.com.au  Fact Sheet for Healthcare Providers: IncredibleEmployment.be  This test is not yet approved or cleared by the [place] FDA and has been authorized for detection and/or diagnosis of SARS-CoV-2 by FDA under an Emergency Use Authorization (EUA). This EUA will remain in effect (meaning this test can be used) for the duration of the COVID-19 declaration under Section 564(b)(1) of the Act, 21  U.S.C. section 360bbb-3(b)(1), unless the authorization is terminated or revoked.  Performed at [facility], [address], [place], [place] [address]   Urine Culture     Status: Abnormal (Preliminary result)   Collection Time: 02/19/21  5:06 AM   Specimen: Urine, Random  Result Value Ref Range Status   Specimen Description   Final    URINE, RANDOM Performed at [facility], [address] [place], [place] [address]    Special Requests   Final    NONE Performed at [facility], [address] [place], [place] [address]    Culture (A)  Final    >=100,000 COLONIES/mL ENTEROCOCCUS FAECALIS SUSCEPTIBILITIES TO FOLLOW Performed at [facility], [place] [address] [place], [place] [address]    Report Status PENDING  Incomplete  Culture, blood (routine x 2)     Status: None (Preliminary result)   Collection Time: 02/19/21  5:12 AM   Specimen: BLOOD  Result Value Ref Range Status   Specimen Description BLOOD BLOOD RIGHT FOREARM  Final   Special Requests   Final    BOTTLES DRAWN AEROBIC AND ANAEROBIC Blood Culture adequate volume   Culture   Final    NO GROWTH 1 DAY Performed at [facility], [address] [place], [place] [address]    Report Status PENDING  Incomplete  Culture, blood (routine x 2)     Status: None (Preliminary result)   Collection Time: 02/19/21  5:12 AM   Specimen: BLOOD  Result Value Ref Range Status   Specimen Description BLOOD LEFT ANTECUBITAL  Final   Special Requests   Final    BOTTLES DRAWN AEROBIC AND ANAEROBIC Blood Culture adequate volume   Culture   Final    NO GROWTH 1 DAY Performed at [facility], [address] [place], [place] [address]    Report Status PENDING  Incomplete      Imaging Studies   CT Abdomen Pelvis Wo Contrast  Result Date: 02/19/2021 CLINICAL DATA:  Constipation with no stool from colostomy for the past week and developing nausea/vomiting EXAM: CT ABDOMEN AND  PELVIS WITHOUT CONTRAST TECHNIQUE: Multidetector CT imaging of the abdomen and pelvis was performed following the standard protocol without IV contrast. COMPARISON:  PET-CT 05/21/2020; CT abdomen/pelvis 05/04/2020 FINDINGS: Lower chest: Dependent airspace opacities present in both lower lobes. Moderate respiratory motion artifact. Diffuse mild bronchial wall  thickening. Visualized cardiac structures are normal in size. No pericardial effusion. Mild circumferential thickening of the distal esophagus. Hepatobiliary: Small radiodensities layer in the gallbladder lumen consistent with cholelithiasis. No inflammatory changes or pericholecystic fluid. Stable small probable hepatic cyst. No solid lesion. No intra or extrahepatic biliary ductal dilatation. Pancreas: Unremarkable. No pancreatic ductal dilatation or surrounding inflammatory changes. Spleen: Normal in size without focal abnormality. Adrenals/Urinary Tract: Unremarkable adrenal glands. No hydronephrosis or nephrolithiasis. Stable exophytic cyst from the right kidney. The bladder is relatively decompressed. Unremarkable ureters. Stomach/Bowel: Multiple loops of dilated and fluid-filled small bowel with internal air-fluid levels present within the low midline abdomen. No focal transition point. There is gradual tapering to normal caliber. The colon is decompressed. Normal appendix in the right lower quadrant. Surgical changes of probable prior low anterior resection with left lower quadrant end colostomy. Vascular/Lymphatic: Limited evaluation in the absence of intravenous contrast. Atherosclerotic calcifications present along the abdominal aorta. No aneurysm. No suspicious lymphadenopathy. Reproductive: Prostate is unremarkable. Other: Small volume ascites in the right anatomic pelvis. Musculoskeletal: Compression fracture of L4 with approximately 60% height loss. Compression fracture of L5 with approximately 30% height loss. Findings are new compared to prior  imaging from June of 2021. IMPRESSION: 1. Partial small bowel obstruction without focal transition point. 2. Small volume of reactive ascites in the right pelvis in the region of dilated and fluid-filled small bowel. 3. Bilateral lower lobe atelectasis versus small volume aspiration superimposed on a background of diffuse chronic bronchial wall thickening. 4. Mild circumferential thickening of the distal esophagus concerning for esophagitis. Does the patient have a clinical history of GERD? 5. Cholelithiasis. 6. Surgical changes of prior low anterior resection with left lower quadrant colostomy. 7.  Aortic Atherosclerosis (ICD10-170.0) 8. L4 and L5 compression fractures are new compared to prior imaging from June of 2021 and have likely occurred at some point in the interim. No findings on today's examination to suggest acute fractures. Electronically Signed   By: Jacqulynn Cadet M.D.   On: 02/19/2021 07:26   DG Chest Port 1 View  Result Date: 02/19/2021 CLINICAL DATA:  Hypoxia.  Constipation for 1 week with emesis. EXAM: PORTABLE CHEST 1 VIEW COMPARISON:  Chest CT 11/16/2020. FINDINGS: Interstitial and hazy airspace opacity asymmetric to the right base. Some more rounded density at the medial right base but no nodule was seen in this area on recent chest CT. No pleural effusion or pneumothorax. Normal heart size and mediastinal contours. IMPRESSION: Hazy pulmonary opacity with asymmetric right-sided involvement more concerning for pneumonia or aspiration. Electronically Signed   By: Monte Fantasia M.D.   On: 02/19/2021 05:17     Medications   Scheduled Meds: . aspirin EC  81 mg Oral Daily  . bisacodyl  5 mg Oral BID  . Chlorhexidine Gluconate Cloth  6 each Topical Daily  . feeding supplement  1 Container Oral TID BM  . folic acid  1 mg Oral Daily  . heparin  5,000 Units Subcutaneous Q8H  . LORazepam  0-4 mg Intravenous Q6H   Followed by  . [START ON 02/21/2021] LORazepam  0-4 mg Intravenous Q12H   . multivitamin with minerals  1 tablet Oral Daily  . nicotine  21 mg Transdermal Daily  . pantoprazole  40 mg Oral q1600  . polyethylene glycol  17 g Oral BID  . sertraline  25 mg Oral Daily  . thiamine  100 mg Oral Daily   Or  . thiamine  100 mg Intravenous Daily  Continuous Infusions: . acetaminophen 1,000 mg (02/20/21 1621)  . dexmedetomidine (PRECEDEX) IV infusion 0.4 mcg/kg/hr (02/20/21 1805)  . piperacillin-tazobactam (ZOSYN)  IV 3.375 g (02/20/21 1448)  . sodium chloride         LOS: 1 day    Time spent: 45 minutes with >50% spent at bedside and in coordination of care.    Ezekiel Slocumb, DO [facility]  02/20/2021, 7:02 PM      If 7PM-7AM, please contact night-coverage. How to contact the [facility] Attending or Consulting provider [place] or covering provider during after hours [place], for this patient?    1. Check the care team in [facility] and look for a) attending/consulting [facility] provider listed and b) the [facility] team listed 2. Log into [email or web address] and use [place] universal password to access. If you do not have the password, please contact the hospital operator. 3. Locate the [facility] provider you are looking for under [facility] and page to a number that you can be directly reached. 4. If you still have difficulty reaching the provider, please page the [facility] (Director on Call) for the Hospitalists listed on amion for assistance.

## 2021-02-20 NOTE — Progress Notes (Signed)
Pt noted to be more nauseous, vomiting x3 today, with worsening disposition and weakness. Pt scored red [place] for fever, increased RR, increased HR treated with tylenol and ice packs. Pt mental status decreasing from being oriented X4 this morning to brief periods of hallucination. CIWA score now 18 and IV ativan initiated MD made aware and placed orders. Will continue to monitor               02/20/21 1557  Assess: MEWS Score  Temp (!) 101.3 F (38.5 C)  BP 140/79  Pulse Rate (!) 114  Resp (!) 24  Level of Consciousness Alert  SpO2 90 %  O2 Device Room Air  Assess: MEWS Score  MEWS Temp 1  MEWS Systolic 0  MEWS Pulse 2  MEWS RR 1  MEWS LOC 0  MEWS Score 4  MEWS Score Color Red  Assess: if the MEWS score is Yellow or Red  Were vital signs taken at a resting state? Yes  Focused Assessment Change from prior assessment (see assessment flowsheet)  Early Detection of Sepsis Score *See Row Information* High  MEWS guidelines implemented *See Row Information* Yes  Treat  MEWS Interventions Administered scheduled meds/treatments;Other (Comment) ([facility] nurse to bedside)  Pain Scale 0-10  Pain Score 6  Pain Type Chronic pain  Pain Location Abdomen  Pain Descriptors / Indicators Aching  Pain Frequency Intermittent  Pain Onset On-going  Pain Intervention(s) Medication (See eMAR)  Take Vital Signs  Increase Vital Sign Frequency  Red: Q 1hr X 4 then Q 4hr X 4, if remains red, continue Q 4hrs  Escalate  MEWS: Escalate Red: discuss with charge nurse/RN and provider, consider discussing with RRT  Notify: Charge Nurse/RN  Name of Charge Nurse/RN Notified M[place]a  Date Charge Nurse/RN Notified 02/20/21  Time Charge Nurse/RN Notified 1600  Notify: Provider  Provider Name/Title Nicole Kindred MD  Date Provider Notified 02/20/21  Time Provider Notified 1530  Notification Type Page  Notification Reason Change in status  Provider response Other (Comment) (tylenol, consult GI surgery)   Date of Provider Response 02/20/21  Time of Provider Response 1535  Notify: Rapid Response  Name of Rapid Response RN Notified Kattie  Date Rapid Response Notified 02/20/21  Time Rapid Response Notified 2992  Document  Patient Outcome Stabilized after interventions

## 2021-02-20 NOTE — Hospital Course (Addendum)
Stephen [name] is a 75 y.o. 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  He was found to have a partial small bowel obstruction on CT abdomen/pelvis without a focal transition point.  Labs showed marked leukocytosis 21.6k, lactic acidosis 3.1, negative for Covid-19.  UA was consistent with infection.  Chest xray concerning for PNA with Right basilar opacity.  Admitted to hospitalist service.  General surgery consulted.  4/9 AM - pt reported having BM after getting Miralax.  Abdominal symptoms better.  Endorses some dysuria.  Denies respiratory symptoms.

## 2021-02-20 NOTE — Evaluation (Addendum)
Occupational Therapy Evaluation Patient Details Name: Stephen [name] MRN: [id number] DOB: September 20, 1946 Today's Date: 02/20/2021    History of Present Illness Pt is a 75 y/o M with PMH: rectal cancer mets to lung (s/p colostomy, chemotherapy), HTN, GERD, depression, smoker, alcohol use, left leg blood clot (not on anticoagulants), who presents with constipation, nausea, vomiting, abdominal pain, weakness. CT of abd/pelvis showed partial SBO. General Sx consulted, pt on full liquid diet.   Clinical Impression   Pt seen for OT evaluation this date to f/u re: safety with ADLs/ADL mobility. Pt lives with spouse and was requiring assist for transfers and ADLs at baseline, although both parties endorse that his functional performance this date is worse and pt appears weaker. Pt requires MOD/MAX A +2 with RW for ADL transfers as well as b/l knee and foot blocking to prevent sliding. Noted to become anxious/agitated in relation to mobilizing. In addition, while pt requires baseline assistance with ADLs, he is essentially requiring MOD A for UB ADLs and MAX to TOTAL A for LB ADLs either seated or bed level (at this time/on date of assessment). Standing tolerance is very poor and pt's balance and reliance on RW prevent him from being able to achieve any standing self care. Pt does have good potential and will benefit from skilled OT f/u both in acute setting and in STR. Pt however, adamantly expresses that he will be going home. Should he d/t home, anticipate he will require HHOT f/u and w/c.     Follow Up Recommendations  SNF    Equipment Recommendations  Other (comment) (should pt d/c home, anticiapte they will require a standard 18" wheelchair)    Recommendations for Other Services       Precautions / Restrictions Precautions Precautions: Fall Restrictions Weight Bearing Restrictions: No      Mobility Bed Mobility Overal bed mobility: Needs Assistance             General bed mobility  comments: pt up to side of bed with PT when OT presents    Transfers Overall transfer level: Needs assistance Equipment used: Rolling walker (2 wheeled) Transfers: Sit to/from Stand Sit to Stand: Mod assist;Max assist;+2 physical assistance;+2 safety/equipment         General transfer comment: b/l knee and foot blocking to prevent sliding. PT cues pt for safe foot placement and to scoot closer to EOB (pt requires MOD A with draw sheet to scoot) and OT cues for safe hand placement with RW.    Balance Overall balance assessment: Needs assistance Sitting-balance support: Feet supported;Single extremity supported Sitting balance-Leahy Scale: Fair Sitting balance - Comments: G static sitting, could not accept balance challenge in unsupported sitting   Standing balance support: Bilateral upper extremity supported Standing balance-Leahy Scale: Zero Standing balance comment: MOD/MAX A +2, hips/knees remain slightly flexed with very poor standing posture. B UE support on RW.                           ADL either performed or assessed with clinical judgement   ADL Overall ADL's : Needs assistance/impaired                                       General ADL Comments: Pt requries MOD A for seated UB ADLs, MAX to TOTAL A for seated/bed level LB ADLs. Pt and spouse endorse that he was (  1-2MA) previously able to do more of his self care, but has been steadily declining. MAX A +2 to take 3-4 small shuffling steps from bed to chair with RW.     Vision Patient Visual Report: No change from baseline       Perception     Praxis      Pertinent Vitals/Pain Pain Assessment: Faces Faces Pain Scale: Hurts even more Pain Location: abdomen Pain Descriptors / Indicators: Tender Pain Intervention(s): Limited activity within patient's tolerance;Monitored during session     Hand Dominance     Extremity/Trunk Assessment Upper Extremity Assessment Upper Extremity  Assessment: Generalized weakness   Lower Extremity Assessment Lower Extremity Assessment: Generalized weakness       Communication Communication Communication: No difficulties   Cognition Arousal/Alertness: Awake/alert Behavior During Therapy: WFL for tasks assessed/performed;Anxious;Agitated Overall Cognitive Status: Difficult to assess                                 General Comments: Pt follows most simple one step commands appropriately, does demo some anxiety/agitation related to mobilizing, but behavior is otherwise [facility]. Pt is oriented to self and situation.   General Comments       Exercises Other Exercises Other Exercises: OT facilitates ed re: role of OT in acute setting, importance of OOB activity, safe use of RW, importance of engaging in however much self care he can contribute to, to not only improve his strength and INDEP, but to also reduce caregiver burden.   Shoulder Instructions      Home Living Family/patient expects to be discharged to:: Private residence Living Arrangements: Spouse/significant other Available Help at Discharge: Family;Available 24 hours/day Type of Home: House Home Access: Level entry     Home Layout: One level               Home Equipment: Hospital bed;Walker - 2 wheels          Prior Functioning/Environment Level of Independence: Needs assistance  Gait / Transfers Assistance Needed: spouse and friend help get pt into/out of bed, he was walking with HHPT some with RW ADL's / Homemaking Assistance Needed: spouse bathes and dresses, does bathing bed level. Spouse performs all HH IADLs.            OT Problem List: Decreased strength;Decreased range of motion;Decreased activity tolerance;Impaired balance (sitting and/or standing);Decreased safety awareness;Increased edema;Pain      OT Treatment/Interventions: Self-care/ADL training;DME and/or AE instruction;Therapeutic activities;Balance training;Therapeutic  exercise;Patient/family education    OT Goals(Current goals can be found in the care plan section) Acute Rehab OT Goals Patient Stated Goal: to go home OT Goal Formulation: With patient/family Time For Goal Achievement: 03/06/21 Potential to Achieve Goals: Fair ADL Goals Pt Will Perform Upper Body Dressing: with set-up;sitting Pt Will Perform Lower Body Dressing: with min assist;sitting/lateral leans Pt Will Transfer to Toilet: with min assist;with mod assist;stand pivot transfer;bedside commode Pt Will Perform Toileting - Clothing Manipulation and hygiene: with min assist;with mod assist;sitting/lateral leans Pt/caregiver will Perform Home Exercise Program: Increased strength;Both right and left upper extremity;With minimal assist  OT Frequency: Min 1X/week   Barriers to D/C:            Co-evaluation PT/OT/SLP Co-Evaluation/Treatment: Yes Reason for Co-Treatment: Complexity of the patient's impairments (multi-system involvement);To address functional/ADL transfers;For patient/therapist safety PT goals addressed during session: Mobility/safety with mobility;Balance;Proper use of DME OT goals addressed during session: ADL's and self-care  AM-PAC OT "6 Clicks" Daily Activity     Outcome Measure Help from another person eating meals?: None Help from another person taking care of personal grooming?: A Lot Help from another person toileting, which includes using toliet, bedpan, or urinal?: Total Help from another person bathing (including washing, rinsing, drying)?: Total Help from another person to put on and taking off regular upper body clothing?: A Lot Help from another person to put on and taking off regular lower body clothing?: Total 6 Click Score: 11   End of Session Equipment Utilized During Treatment: Gait belt;Rolling walker Nurse Communication: Mobility status  Activity Tolerance: Patient tolerated treatment well Patient left: in chair;with call bell/phone within  reach;with chair alarm set  OT Visit Diagnosis: Unsteadiness on feet (R26.81);Muscle weakness (generalized) (M62.81)                Time: 0850-0900 OT Time Calculation (min): 10 min Charges:  OT General Charges $OT Visit: 1 Visit OT Evaluation $OT Eval Moderate Complexity: Octa, MS, OTR/L ascom [phone] 02/20/21, 10:33 AM

## 2021-02-20 NOTE — Evaluation (Signed)
Physical Therapy Evaluation Patient Details Name: Stephen [name] MRN: [id number] DOB: Oct 26, 1946 Today's Date: 02/20/2021   History of Present Illness  Stephen [name] is a 75 y.o. 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  Clinical Impression  Patient presents to physical therapy willing to participate with interventions today despite his verbal proclamation of 6/10 pain in his knees and abdomen. Pt was soiled and required moderate assistance rolling using railing in bed. Pt came to the seated position with mod/max A +2, VCs provided to pt for appropriate hand placement and pursed lip breathing to maintain pain in BIL shoulders. Pt unable to maintain upright positioning without VCs for appropriate hand placement and mod A for scooting using draw sheet to get to foot flat. Pt completed sit<>stand transfer with mod/max A +2, VCs provided for hand placement using RW to improve safety as well as BIL foot blocking to prevent feet from slipping during transfer. Patient completed two side steps to sit in the recliner with maxA +2 for safety and fall prevention, pt verbalized increased BIL knee pain with WB. Pt was visibly anxious and slightly verbally aggressive during trasnfer today. Pt was left seated in chair with call bell in reach, all needs met with wife at his side.     Follow Up Recommendations SNF    Equipment Recommendations       Recommendations for Other Services       Precautions / Restrictions Precautions Precautions: Fall Precaution Comments: Pt very anzious during transfers today Restrictions Weight Bearing Restrictions: No      Mobility  Bed Mobility Overal bed mobility: Needs Assistance Bed Mobility: Rolling Rolling: Mod assist          General bed mobility comments: Pt able to assist with rolling BIL and bridging in bed, but requires VCs for hand placement and sequencing Patient Response: Anxious  Transfers Overall transfer level: Needs assistance Equipment used: Rolling walker (2 wheeled) Transfers: Sit to/from Stand Sit to Stand: Mod assist;Max assist;+2 physical assistance;+2 safety/equipment         General transfer comment: Pt required BIL LE foot blocking to avoid slipping, pt provided VCs and moderate A using draw sheet to scoot to EOB to improve positioning to complete sit<>stand trasnfer, OT provided Vcs for appropriate hand placement while using RW  Ambulation/Gait   Gait Distance (Feet): 2 Feet Assistive device: Standard walker Gait Pattern/deviations: Step-to pattern;Antalgic     General Gait Details: Pt required Max A +2 to complete 2 side steps to the recliner using the RW with VCs for appropriate sequencing and hand placement to improve safety - pt agitated during transfer and demonstrated increased anxiety assosicated with falling while completing the transfer and 2 side steps to recliner.  Stairs            Wheelchair Mobility    Modified Rankin (Stroke Patients Only)       Balance Overall balance assessment: Needs assistance Sitting-balance support: Feet supported;Single extremity supported Sitting balance-Leahy Scale: Fair Sitting balance - Comments: Pt required multiple VCs for UE placement and postural positioning to maintain upright positioning - he was unable to maintain the upright posture for any length of time Postural control: Right lateral lean Standing balance support: Bilateral upper extremity supported Standing balance-Leahy Scale: Zero Standing balance comment: MOD/MAX A +2, hips/knees remain slightly flexed  with very poor standing posture. B UE support on RW.                             Pertinent Vitals/Pain Pain Assessment: Faces Pain Score: 7  Faces  Pain Scale: Hurts even more Pain Location: BIL LE, BIL shoulders Pain Descriptors / Indicators: Aching;Dull Pain Intervention(s): Limited activity within patient's tolerance;Monitored during session;Repositioned    Home Living Family/patient expects to be discharged to:: Private residence Living Arrangements: Spouse/significant other Available Help at Discharge: Family;Available 24 hours/day (Wife is available to assist with needs as physically able) Type of Home: House Home Access: Level entry     Home Layout: [facility] bed;Walker - 2 wheels      Prior Function Level of Independence: Needs assistance   Gait / Transfers Assistance Needed: spouse and friend help get pt into/out of bed, he was walking with HHPT some with RW  ADL's / Homemaking Assistance Needed: spouse bathes and dresses, does bathing bed level. Spouse performs all HH IADLs.        Hand Dominance        Extremity/Trunk Assessment   Upper Extremity Assessment Upper Extremity Assessment: Generalized weakness    Lower Extremity Assessment Lower Extremity Assessment: Generalized weakness (BIL LE knee pain limits his ability to complete full ROM activities with >3/5 strength BIL)    Cervical / Trunk Assessment Cervical / Trunk Assessment: Kyphotic  Communication   Communication: No difficulties  Cognition Arousal/Alertness: Awake/alert Behavior During Therapy: WFL for tasks assessed/performed;Anxious;Agitated Overall Cognitive Status: Difficult to assess                                 General Comments: Pt follows most simple one step commands appropriately, does demo some anxiety/agitation related to mobilizing, but behavior is otherwise [facility] Pt is oriented to self and situation.      General Comments      Exercises Other Exercises Other Exercises: OT facilitates ed re: role of OT in acute setting, importance of OOB activity, safe use of RW, importance of  engaging in however much self care he can contribute to, to not only improve his strength and INDEP, but to also reduce caregiver burden.   Assessment/Plan    PT Assessment Patient needs continued PT services  PT Problem List Decreased strength;Decreased mobility;Decreased safety awareness;Decreased range of motion;Decreased activity tolerance;Decreased cognition;Decreased balance;Decreased knowledge of use of DME;Pain       PT Treatment Interventions DME instruction;Therapeutic exercise;Gait training;Balance training;Stair training;Neuromuscular re-education;Functional mobility training;Patient/family education;Therapeutic activities    PT Goals (Current goals can be found in the Care Plan section)  Acute Rehab PT Goals Patient Stated Goal: to go home PT Goal Formulation: With patient/family Time For Goal Achievement: 03/06/21 Potential to Achieve Goals: Fair    Frequency Min 2X/week   Barriers to discharge   Weakness and  safety awareness    Co-evaluation   Reason for Co-Treatment: Complexity of the patient's impairments (multi-system involvement);To address functional/ADL transfers;For patient/therapist safety PT goals addressed during session: Mobility/safety with mobility;Balance;Proper use of DME OT goals addressed during session: ADL's and self-care       AM-PAC PT "6 Clicks" Mobility  Outcome Measure Help needed turning from your back to your side while in a flat bed without using bedrails?: A Little Help needed moving from lying on your back to sitting on the side of  a flat bed without using bedrails?: A Lot Help needed moving to and from a bed to a chair (including a wheelchair)?: A Lot Help needed standing up from a chair using your arms (e.g., wheelchair or bedside chair)?: A Lot Help needed to walk in hospital room?: A Lot Help needed climbing 3-5 steps with a railing? : Total 6 Click Score: 12    End of Session Equipment Utilized During Treatment: Gait  belt Activity Tolerance: Patient limited by fatigue;Patient limited by pain;Other (comment) (Pt demonstrated increased anxiety with transfers today) Patient left: in chair;with call bell/phone within reach;with chair alarm set;with family/visitor present Nurse Communication: Mobility status PT Visit Diagnosis: Unsteadiness on feet (R26.81);Muscle weakness (generalized) (M62.81);History of falling (Z91.81)    Time: 0827-0902 PT Time Calculation (min) (ACUTE ONLY): 35 min   Charges:   PT Evaluation $PT Eval Low Complexity: 1 Low PT Treatments $Therapeutic Activity: 8-22 mins        Duanne Guess, PT, DPT 02/20/21, 10:55 AM   Stephen [name] 02/20/2021, 10:48 AM

## 2021-02-20 NOTE — Progress Notes (Signed)
Patients oxygen saturation began to drop into the mid 80's while on CPAP. I turned off the Precedex that the patient was on, Nurse practitioner Domingo Pulse adjusted the patients CPAP mask while I called respiratory. Respiratory asked me to increase his oxygen from 5L to 9L. Increasing the oxygen increased his saturation to mid 90's. I secure chatted nurse practitioner Sharion Settler about the patients condition. Asked Randol Kern about placing patient on BIPAP if he starts to decompensate again. Randol Kern asked to place him on BIPAP right then. Randol Kern also ordered VBG draws and 40 of lasix, both were completed. When respiratory went to get the BIPAP, patients oxygen saturations began to drop to the mid 80's again. Patient was mildly responsive during his oxygen saturation dropping and mildly responsive after maintaining his oxygen saturations in the 90's due to still having Precedex in his system. Patient now on BIPAP and saturations are maintaining in the mid to high 90's. Patients blood pressure was elevated during this but is now WDL. Will continue to monitor patients oxygen saturation levels and the patients responsiveness.

## 2021-02-21 DIAGNOSIS — F10231 Alcohol dependence with withdrawal delirium: Secondary | ICD-10-CM

## 2021-02-21 LAB — URINE CULTURE: Culture: >=100000 — AB

## 2021-02-21 LAB — BASIC METABOLIC PANEL
Anion gap: 12 (ref 5–15)
BUN: 16 mg/dL (ref 8–23)
CO2: 21 mmol/L — ABNORMAL LOW (ref 22–32)
Calcium: 8.9 mg/dL (ref 8.9–10.3)
Chloride: 103 mmol/L (ref 98–111)
Creatinine, Ser: 0.84 mg/dL (ref 0.61–1.24)
GFR, Estimated: >60 mL/min (ref >60)
Glucose, Bld: 92 mg/dL (ref 70–99)
Potassium: 4.2 mmol/L (ref 3.5–5.1)
Sodium: 136 mmol/L (ref 135–145)

## 2021-02-21 LAB — CBC WITH DIFFERENTIAL/PLATELET
Abs Immature Granulocytes: 0.11 10*3/uL — ABNORMAL HIGH (ref 0.00–0.07)
Basophils Absolute: 0 10*3/uL (ref 0.0–0.1)
Basophils Relative: 0 %
Eosinophils Absolute: 0 10*3/uL (ref 0.0–0.5)
Eosinophils Relative: 0 %
HCT: 39.9 % (ref 39.0–52.0)
Hemoglobin: 13.7 g/dL (ref 13.0–17.0)
Immature Granulocytes: 1 %
Lymphocytes Relative: 4 %
Lymphs Abs: 0.7 10*3/uL (ref 0.7–4.0)
MCH: 35.9 pg — ABNORMAL HIGH (ref 26.0–34.0)
MCHC: 34.3 g/dL (ref 30.0–36.0)
MCV: 104.5 fL — ABNORMAL HIGH (ref 80.0–100.0)
Monocytes Absolute: 1.2 10*3/uL — ABNORMAL HIGH (ref 0.1–1.0)
Monocytes Relative: 7 %
Neutro Abs: 15.4 10*3/uL — ABNORMAL HIGH (ref 1.7–7.7)
Neutrophils Relative %: 88 %
Platelets: 215 10*3/uL (ref 150–400)
RBC: 3.82 MIL/uL — ABNORMAL LOW (ref 4.22–5.81)
RDW: 13.3 % (ref 11.5–15.5)
WBC: 17.5 10*3/uL — ABNORMAL HIGH (ref 4.0–10.5)
nRBC: 0 % (ref 0.0–0.2)

## 2021-02-21 LAB — PROCALCITONIN: Procalcitonin: 1.26 ng/mL

## 2021-02-21 LAB — MAGNESIUM: Magnesium: 1.7 mg/dL (ref 1.7–2.4)

## 2021-02-21 LAB — PHOSPHORUS: Phosphorus: 2.8 mg/dL (ref 2.5–4.6)

## 2021-02-21 MED ORDER — GABAPENTIN 100 MG PO CAPS
200.0000 mg | ORAL_CAPSULE | Freq: Three times a day (TID) | ORAL | Status: DC
  Administered 2021-02-21 – 2021-02-24 (×8): 200 mg via ORAL
  Filled 2021-02-21 (×9): qty 2

## 2021-02-21 MED ORDER — CHLORDIAZEPOXIDE HCL 25 MG PO CAPS
25.0000 mg | ORAL_CAPSULE | Freq: Three times a day (TID) | ORAL | Status: AC
  Administered 2021-02-21 (×2): 25 mg via ORAL
  Filled 2021-02-21 (×2): qty 1

## 2021-02-21 MED ORDER — GABAPENTIN 100 MG PO CAPS: 200.0000 mg | ORAL_CAPSULE | Freq: Three times a day (TID) | ORAL | Status: DC

## 2021-02-21 MED ORDER — CHLORDIAZEPOXIDE HCL 10 MG PO CAPS
10.0000 mg | ORAL_CAPSULE | Freq: Three times a day (TID) | ORAL | Status: AC
  Administered 2021-02-22 (×2): 10 mg via ORAL
  Filled 2021-02-21 (×2): qty 1

## 2021-02-21 NOTE — Progress Notes (Signed)
Pt transferred to RM 260 at this time. VSS prior to transfer. Wife notified. Report given to 2A RN.

## 2021-02-21 NOTE — Progress Notes (Addendum)
PROGRESS NOTE    Stephen [name]   JEH:[id number]  DOB: Aug 10, 1946  PCP: [name], MD    DOA: 02/19/2021 LOS: 2   Brief Narrative   Stephen [name] is a 75 y.o. 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  He was found to have a partial small bowel obstruction on CT abdomen/pelvis without a focal transition point.  Labs showed marked leukocytosis 21.6k, lactic acidosis 3.1, negative for Covid-19.  UA was consistent with infection.  Chest xray concerning for PNA with Right basilar opacity.  Admitted to hospitalist service.  General surgery consulted.  4/9 AM - pt reported having BM after getting Miralax.  Abdominal symptoms better.  Endorses some dysuria.  Denies respiratory symptoms.      Assessment & Plan   Principal Problem:   Severe sepsis ([place]) Active Problems:   UTI (urinary tract infection)   Acute respiratory failure with hypoxia (HCC)   Aspiration pneumonia (HCC)   Partial small bowel obstruction (HCC)   Alcohol withdrawal syndrome (HCC)   HTN (hypertension)   Alcohol use   Malnutrition of moderate degree   Smoker   Rectal cancer (HCC)   GERD (gastroesophageal reflux disease)   Depression   Alcohol use disorder with  Acute alcohol withdrawal syndrome 4/9 afternoon-early evening pt febrile, tachycardic, tachypneic, hallucinating, high CIWA scores. Wife reports at least six liquor/whiskey drink daily, thinks more recently.  She does not know of prior withdrawal symptoms if he stops drinking. --Transfer to stepdown unit --Was on Precedex in stepdown overnight --Librium, low dose gabapentin scheduled --Continue CIWA protocol with PRN  Ativan --Transfer out to med/surg if stable later this afternoon   Partial SBO -present on admission.  Resolved, return of bowel function after Miralax. General surgery consulted, signed off. NG tube was not indicated, nor surgical intervention.  Severe sepsis due to UTI and pneumonia -sepsis present on admission with leukocytosis, tachycardia, tachypnea.  Lactic acidosis consistent with severe sepsis.  Treated per sepsis protocol in the ED.  Hemodynamically stable. --Continue empiric Zosyn for now --Off IV fluids --Follow blood, urine and sputum cultures  UTI -present admission. UA with large leukocytes, rare bacteria, >50 WBCs. --On Zosyn --Urine culture growing Enterococcus faecalis --Follow-up culture susceptibilities  Community-acquired pneumonia versus aspiration -present on admission. Chest x-ray showed hazy pulmonary opacity with asymmetric right-sided involvement more concerning for pneumonia or aspiration. Evaluated by SLP, deemed mild aspiration risk, regular diet consistency recommended with general aspiration and reflux precautions. Negative for COVID-19 and influenza A and B. --Continue empiric Zosyn --Follow-up sputum culture --Supportive care: Mucinex, albuterol, incentive spirometer, flutter valve  Acute respiratory failure with hypoxia -patient presented with tachypnea, x-ray findings concerning for pneumonia, O2 saturation 88% on room air.  Oxygenation proved on 2 L/min nasal cannula oxygen. 4/9: Weaned to room air with sats 91 to 93% --Monitor closely --Supplemental oxygen as needed to maintain sats greater than 90%  Likely OSA - CPAP or BiPAP overnight  Hypokalemia - K 3.3 on 4/9, replaced.  Resolved. --Monitor BMP and replace as needed.    Essential hypertension -chronic, stable.  Home Benicar is held due to sepsis and risk of hypotension. 4/9: BP normal with meds held --Continue home Benicar monitor BP, resume when indicated --As  needed  hydralazine  Tobacco abuse -nicotine patch ordered.    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  Reportedly finished chemotherapy in 2016. Currently no acute issues. --Palliative care consulted for goals of care discussion  GERD -continue Protonix  Depression -continue Zoloft  Lumbar compression fracture -seen on CT abdomen pelvis, L4 and L5 compression fractures new when compared To imaging from June 2021.  These have occurred in the interim.  No acute fractures seen. --Continue home Tylenol and as needed Percocet    Patient BMI: Body mass index is 26.17 kg/m.   DVT prophylaxis: heparin injection 5,000 Units Start: 02/19/21 1400   Diet:  Diet Orders (From admission, onward)    Start     Ordered   02/21/21 1230  Diet full liquid Room service appropriate? Yes; Fluid consistency: Thin  Diet effective now       Question Answer Comment  Room service appropriate? Yes   Fluid consistency: Thin      02/21/21 1230            Code Status: Full Code    Subjective 02/21/21    Patient seen in stepdown unit with wife at bedside this AM.  He was taken off Precedex later yesterday evening and has not required it since.  He denies fever/chills, cough, SOB, n/v/d or other complaints.  Passing some air and stool.   Disposition Plan & Communication   Status is: Inpatient  Inpatient status remains appropriate due to severity of illness: Severe sepsis with cultures pending on empiric antibiotics.  Dispo: The patient is from: Home              Anticipated d/c is to: Home              Patient currently not medically stable for discharge   Difficult to place patient no  Family communication: updated wife by phone around 1745 this evening regarding stepdown transfer and patient in alcohol withdrawal.   Consults, Procedures, Significant Events   Consultants:   General surgery  Procedures:   None  Antimicrobials:  Anti-infectives (From admission,  onward)   Start     Dose/Rate Route Frequency Ordered Stop   02/19/21 1400  piperacillin-tazobactam (ZOSYN) IVPB 3.375 g        3.375 g 12.5 mL/hr over 240 Minutes Intravenous Every 8 hours 02/19/21 0728     02/19/21 0600  piperacillin-tazobactam (ZOSYN) IVPB 3.375 g        3.375 g 100 mL/hr over 30 Minutes Intravenous  Once 02/19/21 0545 02/19/21 0728        Micro    Objective   Vitals:   02/21/21 1100 02/21/21 1200 02/21/21 1300 02/21/21 1400  BP: 126/71 129/73 131/72 131/69  Pulse: 99 95 97 96  Resp: (!) 29 (!) 30 (!) 29 18  Temp:  99.7 F (37.6 C)  99.7 F (37.6 C)  TempSrc:  Axillary  Axillary  SpO2: 95% 96% 98% 96%  Weight:      Height:        Intake/Output Summary (Last 24 hours) at 02/21/2021 1423 Last data filed at 02/21/2021 1148 Gross per 24 hour  Intake 618.38 ml  Output 1500 ml  Net -881.62 ml   Filed Weights   02/19/21 0457 02/20/21 1804  Weight: 77.1 kg 75.8 kg    Physical Exam:  General exam: resting comfortably, wakes to voice, no acute distress HEENT: on bipap, hearing grossly normal  Respiratory system: Right base diminished, on Bipap,  normal respiratory effort Cardiovascular system: normal S1/S2, RRR, no pedal edema.   Gastrointestinal system: soft, NT, ND, hypoactive bowel sounds, colostomy present, small amount stool in colostomy bag. Central nervous system: no gross focal neurologic deficits, normal speech Psychiatry: normal mood, congruent affect, no apparent active hallucinations  Labs   Data Reviewed: I have personally reviewed following labs and imaging studies  CBC: Recent Labs  Lab 02/19/21 0506 02/20/21 0412 02/21/21 0518  WBC 21.6* 20.0* 17.5*  NEUTROABS 20.0*  --  15.4*  HGB 15.4 11.6* 13.7  HCT 44.0 33.2* 39.9  MCV 100.5* 104.1* 104.5*  PLT 309 170 299   Basic Metabolic Panel: Recent Labs  Lab 02/19/21 0506 02/20/21 0412 02/21/21 0518  NA 132* 135 136  K 4.5 3.3* 4.2  CL 98 104 103  CO2 21* 22 21*  GLUCOSE  146* 81 92  BUN 13 15 16   CREATININE 0.80 0.75 0.84  CALCIUM 9.7 8.5* 8.9  MG  --   --  1.7  PHOS  --   --  2.8   GFR: Estimated Creatinine Clearance: 72.1 mL/min (by C-G formula based on SCr of 0.84 mg/dL). Liver Function Tests: Recent Labs  Lab 02/19/21 0506  AST 56*  ALT 21  ALKPHOS 96  BILITOT 1.4*  PROT 7.8  ALBUMIN 3.4*   Recent Labs  Lab 02/19/21 0506  LIPASE 29   No results for input(s): AMMONIA in the last 168 hours. Coagulation Profile: No results for input(s): INR, PROTIME in the last 168 hours. Cardiac Enzymes: No results for input(s): CKTOTAL, CKMB, CKMBINDEX, TROPONINI in the last 168 hours. BNP (last 3 results) No results for input(s): PROBNP in the last 8760 hours. HbA1C: No results for input(s): HGBA1C in the last 72 hours. CBG: Recent Labs  Lab 02/20/21 1803  GLUCAP 125*   Lipid Profile: No results for input(s): CHOL, HDL, LDLCALC, TRIG, CHOLHDL, LDLDIRECT in the last 72 hours. Thyroid Function Tests: No results for input(s): TSH, T4TOTAL, FREET4, T3FREE, THYROIDAB in the last 72 hours. Anemia Panel: No results for input(s): VITAMINB12, FOLATE, FERRITIN, TIBC, IRON, RETICCTPCT in the last 72 hours. Sepsis Labs: Recent Labs  Lab 02/19/21 0509 02/19/21 0730 02/19/21 1202 02/21/21 0518  PROCALCITON  --   --  1.50 1.26  LATICACIDVEN 3.1* 1.7  --   --     Recent Results (from the past 240 hour(s))  Resp Panel by RT-PCR (Flu A&B, Covid) Nasopharyngeal Swab     Status: None   Collection Time: 02/19/21  5:06 AM   Specimen: Nasopharyngeal Swab; Nasopharyngeal(NP) swabs in vial transport medium  Result Value Ref Range Status   SARS Coronavirus 2 by RT PCR NEGATIVE NEGATIVE Final    Comment: (NOTE) SARS-CoV-2 target nucleic acids are NOT DETECTED.  The SARS-CoV-2 RNA is generally detectable in upper respiratory specimens during the acute phase of infection. The lowest concentration of SARS-CoV-2 viral copies this assay can detect is 138  copies/mL. A negative result does not preclude SARS-Cov-2 infection and should not be used as the sole basis for treatment or other patient management decisions. A negative result may occur with  improper specimen collection/handling, submission of specimen other than nasopharyngeal swab, presence of viral mutation(s) within the areas targeted by this assay, and inadequate number of viral copies(<138 copies/mL). A negative result must be combined with clinical observations, patient history, and epidemiological information. The expected result is Negative.  Fact Sheet for Patients:  EntrepreneurPulse.com.au  Fact Sheet for Healthcare Providers:  IncredibleEmployment.be  This test  is no t yet approved or cleared by the [place] and  has been authorized for detection and/or diagnosis of SARS-CoV-2 by FDA under an Emergency Use Authorization (EUA). This EUA will remain  in effect (meaning this test can be used) for the duration of the COVID-19 declaration under Section 564(b)(1) of the Act, 21 U.S.C.section 360bbb-3(b)(1), unless the authorization is terminated  or revoked sooner.       Influenza A by PCR NEGATIVE NEGATIVE Final   Influenza B by PCR NEGATIVE NEGATIVE Final    Comment: (NOTE) The Xpert Xpress SARS-CoV-2/FLU/RSV plus assay is intended as an aid in the diagnosis of influenza from Nasopharyngeal swab specimens and should not be used as a sole basis for treatment. Nasal washings and aspirates are unacceptable for Xpert Xpress SARS-CoV-2/FLU/RSV testing.  Fact Sheet for Patients: EntrepreneurPulse.com.au  Fact Sheet for Healthcare Providers: IncredibleEmployment.be  This test is not yet approved or cleared by the [place] FDA and has been authorized for detection and/or diagnosis of SARS-CoV-2 by FDA under an Emergency Use Authorization (EUA). This EUA will remain in effect (meaning  this test can be used) for the duration of the COVID-19 declaration under Section 564(b)(1) of the Act, 21 U.S.C. section 360bbb-3(b)(1), unless the authorization is terminated or revoked.  Performed at [facility], [address], [place], [place] [address]   Urine Culture     Status: Abnormal (Preliminary result)   Collection Time: 02/19/21  5:06 AM   Specimen: Urine, Random  Result Value Ref Range Status   Specimen Description   Final    URINE, RANDOM Performed at [facility], [address], [place], [place] [address]    Special Requests   Final    NONE Performed at [facility], [address] [place], [place] [address]    Culture (A)  Final    >=100,000 COLONIES/mL ENTEROCOCCUS FAECALIS SUSCEPTIBILITIES TO FOLLOW Performed at [facility], [place] [address], [place], [place] [address]    Report Status PENDING  Incomplete  Culture, blood (routine x 2)     Status: None (Preliminary result)   Collection Time: 02/19/21  5:12 AM   Specimen: BLOOD  Result Value Ref Range Status   Specimen Description BLOOD BLOOD RIGHT FOREARM  Final   Special Requests   Final    BOTTLES DRAWN AEROBIC AND ANAEROBIC Blood Culture adequate volume   Culture   Final    NO GROWTH 2 DAYS Performed at [facility], [address], [place], [place] [address]    Report Status PENDING  Incomplete  Culture, blood (routine x 2)     Status: None (Preliminary result)   Collection Time: 02/19/21  5:12 AM   Specimen: BLOOD  Result Value Ref Range Status   Specimen Description BLOOD LEFT ANTECUBITAL  Final   Special Requests   Final    BOTTLES DRAWN AEROBIC AND ANAEROBIC Blood Culture adequate volume   Culture   Final    NO GROWTH 2 DAYS Performed at [facility], [address] [place],  [address]    Report Status PENDING  Incomplete  MRSA PCR Screening     Status: None   Collection Time: 02/20/21  6:09 PM   Specimen: Nasopharyngeal   Result Value Ref Range Status   MRSA by PCR NEGATIVE NEGATIVE Final    Comment:        The GeneXpert MRSA Assay (FDA approved for NASAL specimens only), is one component of a  comprehensive MRSA colonization surveillance program. It is not intended to diagnose MRSA infection nor to guide or monitor treatment for MRSA infections. Performed at [facility], [address], [place], [place] [address]       Imaging Studies   No results found.   Medications   Scheduled Meds: . aspirin EC  81 mg Oral Daily  . bisacodyl  5 mg Oral BID  . [START ON 02/22/2021] chlordiazePOXIDE  10 mg Oral TID  . chlordiazePOXIDE  25 mg Oral TID  . Chlorhexidine Gluconate Cloth  6 each Topical Daily  . feeding supplement  1 Container Oral TID BM  . folic acid  1 mg Oral Daily  . gabapentin  200 mg Oral TID  . heparin  5,000 Units Subcutaneous Q8H  . multivitamin with minerals  1 tablet Oral Daily  . nicotine  21 mg Transdermal Daily  . pantoprazole  40 mg Oral q1600  . polyethylene glycol  17 g Oral BID  . sertraline  25 mg Oral Daily  . thiamine  100 mg Oral Daily   Or  . thiamine  100 mg Intravenous Daily   Continuous Infusions: . sodium chloride Stopped (02/20/21 2203)  . acetaminophen Stopped (02/20/21 1640)  . dexmedetomidine (PRECEDEX) IV infusion Stopped (02/20/21 2241)  . piperacillin-tazobactam (ZOSYN)  IV 3.375 g (02/21/21 1301)       LOS: 2 days    Time spent: 30 minutes with >50% spent at bedside and in coordination of care.    Ezekiel Slocumb, DO [facility]  02/21/2021, 2:23 PM      If 7PM-7AM, please contact night-coverage. How to contact the [facility] Attending or Consulting provider [place] or covering provider during after hours [place], for this patient?    1. Check the care team in [facility] and look for a) attending/consulting [facility] provider listed and b) the [facility] team listed 2. Log into [email or web address] and use [place] universal password to  access. If you do not have the password, please contact the hospital operator. 3. Locate the [facility] provider you are looking for under [facility] and page to a number that you can be directly reached. 4. If you still have difficulty reaching the provider, please page the [facility] (Director on Call) for the Hospitalists listed on amion for assistance.

## 2021-02-21 NOTE — Progress Notes (Signed)
Bipap removed at this time. Pt placed on 2L [place]. Tolerating well.

## 2021-02-21 NOTE — Consult Note (Signed)
Pharmacy Antibiotic Note  Stephen [name] is a 75 y.o. male admitted on 02/19/2021 with aspiration PNA/UTI.   (though also covering for any potential abdominal pathogens)  Pharmacy has been consulted for Zosyn dosing. 4/8 Ucx >=100,000 COLONIES/mL ENTEROCOCCUS FAECALIS Plan:  Day 3 of abx. Continue Zosyn 3.375g IV q8h (4 hour infusion).  Height: 5\' 7"  (170.2 cm) Weight: 75.8 kg (167 lb 1.7 oz) IBW/kg (Calculated) : 66.1  Temp (24hrs), Avg:99.4 F (37.4 C), Min:97.9 F (36.6 C), Max:101.7 F (38.7 C)  Recent Labs  Lab 02/19/21 0506 02/19/21 0509 02/19/21 0730 02/20/21 0412 02/21/21 0518  WBC 21.6*  --   --  20.0* 17.5*  CREATININE 0.80  --   --  0.75 0.84  LATICACIDVEN  --  3.1* 1.7  --   --     Estimated Creatinine Clearance: 72.1 mL/min (by C-G formula based on SCr of 0.84 mg/dL).    Allergies  Allergen Reactions  . No Known Allergies     Antimicrobials this admission: Zosyn 4/8 >>  Dose adjustments this admission: N/A  Microbiology results: 4/8 BCx: pending 4/8 UCx: >=100,000 COLONIES/mL ENTEROCOCCUS FAECALIS  COVID/FLU NEG   Thank you for allowing pharmacy to be a part of this patient's care.  Oswald Hillock, PharmD, BCPS Clinical Pharmacist 02/21/2021 11:18 AM

## 2021-02-21 NOTE — Progress Notes (Signed)
Patient is now alert and oriented by 4. Patient is able to follow commands. Will continue to monitor and provide care.

## 2021-02-22 ENCOUNTER — Inpatient Hospital Stay: Payer: Medicare HMO

## 2021-02-22 DIAGNOSIS — Z515 Encounter for palliative care: Secondary | ICD-10-CM

## 2021-02-22 DIAGNOSIS — R1084 Generalized abdominal pain: Secondary | ICD-10-CM

## 2021-02-22 DIAGNOSIS — C20 Malignant neoplasm of rectum: Secondary | ICD-10-CM

## 2021-02-22 DIAGNOSIS — E440 Moderate protein-calorie malnutrition: Secondary | ICD-10-CM

## 2021-02-22 LAB — BASIC METABOLIC PANEL
Anion gap: 12 (ref 5–15)
BUN: 18 mg/dL (ref 8–23)
CO2: 22 mmol/L (ref 22–32)
Calcium: 8.7 mg/dL — ABNORMAL LOW (ref 8.9–10.3)
Chloride: 104 mmol/L (ref 98–111)
Creatinine, Ser: 0.7 mg/dL (ref 0.61–1.24)
GFR, Estimated: >60 mL/min (ref >60)
Glucose, Bld: 90 mg/dL (ref 70–99)
Potassium: 2.9 mmol/L — ABNORMAL LOW (ref 3.5–5.1)
Sodium: 138 mmol/L (ref 135–145)

## 2021-02-22 LAB — STREP PNEUMONIAE URINARY ANTIGEN: Strep Pneumo Urinary Antigen: NEGATIVE

## 2021-02-22 LAB — CBC WITH DIFFERENTIAL/PLATELET
Abs Immature Granulocytes: 0.16 10*3/uL — ABNORMAL HIGH (ref 0.00–0.07)
Basophils Absolute: 0.1 10*3/uL (ref 0.0–0.1)
Basophils Relative: 1 %
Eosinophils Absolute: 0.1 10*3/uL (ref 0.0–0.5)
Eosinophils Relative: 1 %
HCT: 34.9 % — ABNORMAL LOW (ref 39.0–52.0)
Hemoglobin: 12.6 g/dL — ABNORMAL LOW (ref 13.0–17.0)
Immature Granulocytes: 1 %
Lymphocytes Relative: 5 %
Lymphs Abs: 0.8 10*3/uL (ref 0.7–4.0)
MCH: 36.6 pg — ABNORMAL HIGH (ref 26.0–34.0)
MCHC: 36.1 g/dL — ABNORMAL HIGH (ref 30.0–36.0)
MCV: 101.5 fL — ABNORMAL HIGH (ref 80.0–100.0)
Monocytes Absolute: 1.2 10*3/uL — ABNORMAL HIGH (ref 0.1–1.0)
Monocytes Relative: 8 %
Neutro Abs: 13.4 10*3/uL — ABNORMAL HIGH (ref 1.7–7.7)
Neutrophils Relative %: 84 %
Platelets: 222 10*3/uL (ref 150–400)
RBC: 3.44 MIL/uL — ABNORMAL LOW (ref 4.22–5.81)
RDW: 13.5 % (ref 11.5–15.5)
WBC: 15.8 10*3/uL — ABNORMAL HIGH (ref 4.0–10.5)
nRBC: 0 % (ref 0.0–0.2)

## 2021-02-22 LAB — MAGNESIUM: Magnesium: 1.7 mg/dL (ref 1.7–2.4)

## 2021-02-22 LAB — PROCALCITONIN: Procalcitonin: 0.54 ng/mL

## 2021-02-22 MED ORDER — POTASSIUM CHLORIDE CRYS ER 20 MEQ PO TBCR
40.0000 meq | EXTENDED_RELEASE_TABLET | ORAL | Status: AC
  Administered 2021-02-22: 40 meq via ORAL
  Filled 2021-02-22: qty 2

## 2021-02-22 MED ORDER — THIAMINE HCL 100 MG/ML IJ SOLN
500.0000 mg | INTRAVENOUS | Status: DC
  Administered 2021-02-22 – 2021-02-23 (×2): 500 mg via INTRAVENOUS
  Filled 2021-02-22 (×3): qty 5

## 2021-02-22 MED ORDER — AMOXICILLIN-POT CLAVULANATE 875-125 MG PO TABS
1.0000 | ORAL_TABLET | Freq: Two times a day (BID) | ORAL | Status: DC
  Administered 2021-02-22 – 2021-02-24 (×5): 1 via ORAL
  Filled 2021-02-22 (×5): qty 1

## 2021-02-22 MED ORDER — LORAZEPAM 2 MG/ML IJ SOLN: 1.0000 mg | INTRAMUSCULAR | Status: DC | PRN

## 2021-02-22 MED ORDER — LORAZEPAM 1 MG PO TABS: 1.0000 mg | ORAL_TABLET | ORAL | Status: DC | PRN

## 2021-02-22 NOTE — Care Management Important Message (Signed)
Important Message  Patient Details  Name: Stephen [name] MRN: [id number] Date of Birth: [birth year]   Medicare Important Message Given:  Yes     [name] 02/22/2021, 11:07 AM

## 2021-02-22 NOTE — Progress Notes (Addendum)
Nutrition Follow-up  DOCUMENTATION CODES:   Non-severe (moderate) malnutrition in context of chronic illness  INTERVENTION:   -D/c Boost Breeze po TID, each supplement provides 250 kcal and 9 grams of protein -Continue MVI with minerals daily -Magic cup TID with meals, each supplement provides 290 kcal and 9 grams of protein -RD will follow for diet advance and adjust supplement regimen as appropriate  NUTRITION DIAGNOSIS:   Moderate Malnutrition related to chronic illness (rectal cancer with mets to lung) as evidenced by energy intake < or equal to 75% for > or equal to 1 month,mild fat depletion,moderate fat depletion,mild muscle depletion,moderate muscle depletion.  Ongoing  GOAL:   Patient will meet greater than or equal to 90% of their needs  Progressing   MONITOR:   PO intake,Supplement acceptance,Diet advancement,Labs,Weight trends,Skin  REASON FOR ASSESSMENT:   Malnutrition Screening Tool    ASSESSMENT:   Stephen [name] is a 75 y.o. 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  4/7- advanced to clear liquid diet 4/10- advanced to full liquid diet  Reviewed I/O's: -817 ml x 24 hours and +3.1 L since admission  UOP: 800 ml x 24 hours   Stool output: 100 ml x 24 hours  Pt requiring Bi-pap intermittently. He transferred to the ICU on 02/20/21 secondary to AMS.   Pt has been advanced to a full liquid diet; no meal completions documented. Per MAR, pt is consuming Boost Breeze supplements. Per conversation with pt and wife, pt does not tolerate milk-based supplements well.   Case discussed with SLP; pt to be downgraded to nectar thick liquids.   Medications reviewed and include dulcolax, potassium chloride, thiamine, and miralax.    Labs reviewed:K: 2.9, CBGS: 2.9.  Diet Order:   Diet Order            Diet full liquid Room service appropriate? Yes; Fluid consistency: Thin  Diet effective now                 EDUCATION NEEDS:   Education needs have been addressed  Skin:  Skin Assessment: Reviewed RN Assessment  Last BM:  02/21/21  Height:   Ht Readings from Last 1 Encounters:  02/21/21 5\' 7"  (1.702 m)    Weight:   Wt Readings from Last 1 Encounters:  02/22/21 72.2 kg    Ideal Body Weight:  67.3 kg  BMI:  Body mass index is 24.93 kg/m.  Estimated Nutritional Needs:   Kcal:  2100-2300  Protein:  115-130 grams  Fluid:  > 2 L    Loistine Chance, RD, LDN, [place] Registered Dietitian II Certified Diabetes Care and Education Specialist Please refer to [facility] for RD and/or RD on-call/weekend/after hours pager

## 2021-02-22 NOTE — Progress Notes (Signed)
Daily Progress Note   Patient Name: Stephen [name]       Date: 02/22/2021 DOB: 1946/10/20  Age: 75 y.o. MRN#: [id number] Attending Physician: Ezekiel Slocumb, DO Primary Care Physician: [name], MD Admit Date: 02/19/2021  Reason for Consultation/Follow-up: Establishing goals of care  Subjective: Chart Reviewed. Updates received. Patient assessed.   Patient doing up in bed watching TV.  Denies pain or shortness of breath.  He is awake, alert and oriented x3.  Some complaints of generalized weakness.  Discussed at length with patient regarding his high risk of aspiration and recommended diet by SLP.  Patient is requesting to have discussion with his wife.  No family at the bedside.  Spoke with wife via phone and provided extensive updates.  We discussed at length patient's current illness, comorbidities, prognosis.  Wife verbalizes understanding and appreciation expressing her awareness however with hopes that he would show some improvement.  I acknowledge Mrs. Arns's expressed hopefulness and addressed with sensitivity concerns for patient's ability to make a meaningful recovery and return to previous baseline.  She is tearful expressing she knows that he is "changing" but is not ready to give up on him.  We discussed patient's decreased appetite and high risk of aspiration.  Education provided on recommendations by SLP.  I also discussed artificial feedings/PEG in the event patient nutrition continues to decline.  Wife states she does not feel he would want any forms of artificial feeding as patient is "stubborn" acknowledging high likelihood he would pull out or disagree with initial placement.  Sydell Axon states she struggles with patient at times at home following recommendations as he does what he likes to do and is a man who was not open to doing things that he is not in agreement with.  She shares her understanding of diet recommendations however she does not feel he will comply  in the hospital or when he returns home.  I discussed at length complications of continued aspiration including recurrent pneumonia and death.  She is tearful verbalizing understanding.  I discussed at length patient's full CODE STATUS with consideration of his current illness and comorbidities.  Wife states they have discussed in the past and would like to further discuss with patient again to make sure his wishes had not changed.  Patient has a completed DNR on file from 2020 however wife states she does not think this document is accurate at this time.  Encouraged wife to continue to have ongoing discussions with patient and medical team.  All questions answered and support provided.  Length of Stay: 3 days  Vital Signs: BP 136/66 (BP Location: Right Arm)   Pulse 92   Temp 98.3 F (36.8 C)   Resp 20   Ht 5\' 7"  (1.702 m)   Wt 72.2 kg   SpO2 97%   BMI 24.93 kg/m  SpO2: SpO2: 97 % O2 Device: O2 Device: Room Air O2 Flow Rate: O2 Flow Rate (L/min): 2 L/min  Physical Exam: Awake, alert, chronically ill-appearing, NAD Diminished bilaterally RRR Mood appropriate, follows commands           Palliative Care Assessment & Plan   Goals of Care/Recommendations: Full code as confirmed by wife Continue to treat the treatable Discussed at length patient's poor long-term prognosis, high risk of aspiration, and reemphasized education regarding SLP recommendations and diet.  Wife verbalizes understanding however is concerned patient would not comply. Recommendation for outpatient palliative to continue with ongoing discussions. PMT will continue to support  and follow  Prognosis: Guarded  Discharge Planning: To Be Determined  Thank you for allowing the Palliative Medicine Team to assist in the care of this patient.  Time Total: 45 min.   Visit consisted of counseling and education dealing with the complex and emotionally intense issues of symptom management and palliative care in the  setting of serious and potentially life-threatening illness.Greater than 50%  of this time was spent counseling and coordinating care related to the above assessment and plan.  Alda Lea, AGPCNP-BC  Palliative Medicine Team [phone]

## 2021-02-22 NOTE — Progress Notes (Signed)
Pt off of Bipap at this time. Placed back on O2 @ 2liters.

## 2021-02-22 NOTE — Progress Notes (Addendum)
PROGRESS NOTE    Stephen [name]   LPF:[id number]  DOB: [birth year]  PCP: [name], MD    DOA: 02/19/2021 LOS: 3   Brief Narrative   Stephen [name] is a 75 y.o. 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  He was found to have a partial small bowel obstruction on CT abdomen/pelvis without a focal transition point.  Labs showed marked leukocytosis 21.6k, lactic acidosis 3.1, negative for Covid-19.  UA was consistent with infection.  Chest xray concerning for PNA with Right basilar opacity.  Admitted to hospitalist service.  General surgery consulted.  4/9 AM - pt reported having BM after getting Miralax.  Abdominal symptoms better.  Endorses some dysuria.  Denies respiratory symptoms.      Assessment & Plan   Principal Problem:   Severe sepsis ([place]) Active Problems:   UTI (urinary tract infection)   Acute respiratory failure with hypoxia (HCC)   Aspiration pneumonia (HCC)   Partial small bowel obstruction (HCC)   Alcohol withdrawal syndrome (HCC)   HTN (hypertension)   Alcohol use   Malnutrition of moderate degree   Smoker   Rectal cancer (HCC)   GERD (gastroesophageal reflux disease)   Depression   Alcohol use disorder with  Acute alcohol withdrawal syndrome 4/9 afternoon-early evening pt febrile, tachycardic, tachypneic, hallucinating, high CIWA scores. Wife reports at least six liquor/whiskey drink daily, thinks more recently.  She does not know of prior withdrawal symptoms if he stops drinking. 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  Dysphagia - SLP following.  MBSS today.  Diet per SLP recommendation.  Aspiration and reflux precautions. Supervise all PO intake.  Partial SBO -present on admission.  Resolved, return of bowel function after Miralax. General surgery consulted, signed off. NG tube was not indicated, nor surgical intervention.  Severe sepsis due to UTI and pneumonia -sepsis present on admission with leukocytosis, tachycardia, tachypnea.  Lactic acidosis consistent with severe sepsis.  Treated per sepsis protocol in the ED.  Hemodynamically stable. --Continue empiric Zosyn for now --Off IV fluids --Follow blood, urine and sputum cultures  UTI -present admission. UA with large leukocytes, rare bacteria, >50 WBCs. --Abs: Zosyn >> Augmentin (4/11) --Urine culture growing Enterococcus faecalis --Follow-up culture susceptibilities  Community-acquired pneumonia versus aspiration -present on admission. Chest x-ray showed hazy pulmonary opacity with asymmetric right-sided involvement more concerning for pneumonia or aspiration. Evaluated by SLP, deemed mild aspiration risk, regular diet consistency recommended with general aspiration and reflux precautions. Negative for COVID-19 and influenza A and B. --Abx: Zosyn >> Augmentin (4/11) --Follow-up sputum culture --Supportive care: Mucinex, albuterol, incentive spirometer, flutter valve  Acute respiratory failure with hypoxia -patient presented with tachypnea, x-ray findings concerning for pneumonia, O2 saturation 88% on room air.  Oxygenation proved on 2 L/min nasal cannula oxygen. 4/9: Weaned to room air with sats 91 to 93% --Monitor closely --Supplemental oxygen as needed to maintain sats greater than 90%  Likely OSA - CPAP or BiPAP  overnight  Hypokalemia - K 3.3 on 4/9, replaced.  Resolved. --Monitor BMP and replace as needed.    Essential hypertension -chronic, stable.  Home Benicar is held due to sepsis and risk of hypotension. 4/9: BP normal with meds held --Continue home Benicar monitor BP, resume when indicated --As needed hydralazine  Tobacco abuse -nicotine patch ordered.    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  Reportedly finished chemotherapy in 2016. Currently no acute issues. --Palliative care consulted for goals of care discussion  GERD -continue Protonix  Depression -continue Zoloft  Lumbar compression fracture -seen on CT abdomen pelvis, L4 and L5 compression fractures new when compared To imaging from June 2021.  These have occurred in the interim.  No acute fractures seen. --Continue home Tylenol and as needed Percocet    Patient BMI: Body mass index is 24.93 kg/m.   DVT prophylaxis: heparin injection 5,000 Units Start: 02/19/21 1400   Diet:  Diet Orders (From admission, onward)    Start     Ordered   02/22/21 1326  Diet full liquid Room service appropriate? Yes with Assist; Fluid consistency: Nectar Thick  Diet effective now       Comments: NO STRAWS!  Extra Cup for thickened milk.  Question Answer Comment  Room service appropriate? Yes with Assist   Fluid consistency: Nectar Thick      02/22/21 1326            Code Status: Full Code    Subjective 02/22/21    Patient reports a headache from taking a mask off (assume he means Bipap).  Cough better.  Denies fever/chills, n/v.  Wife at bedside, concerned about her ability to assist him at home.  Wife reports stool output she had just changed his bag.   Disposition Plan & Communication   Status is: Inpatient  Inpatient status remains appropriate due to severity of illness: ongoing encephalopathy, dysphagia, ongoing evaluation  Dispo: The patient is from: Home              Anticipated d/c is to:  Home w [facility]               Patient currently not medically stable for discharge   Difficult to place patient no  Family communication: wife at bedside on rounds   Consults, Procedures, Significant Events   Consultants:   General surgery  Procedures:   None  Antimicrobials:  Anti-infectives (From admission, onward)   Start     Dose/Rate Route Frequency Ordered Stop   02/22/21 1000  amoxicillin-clavulanate (AUGMENTIN) 875-125 MG per tablet 1 tablet        1 tablet Oral Every 12 hours 02/22/21 0904 02/26/21 0959   02/19/21 1400  piperacillin-tazobactam (ZOSYN) IVPB 3.375 g  Status:  Discontinued        3.375 g 12.5 mL/hr over 240 Minutes Intravenous Every 8 hours 02/19/21 0728 02/22/21 0904   02/19/21 0600  piperacillin-tazobactam (ZOSYN) IVPB 3.375 g        3.375 g 100 mL/hr over 30 Minutes Intravenous  Once 02/19/21 0545 02/19/21 0728        Micro    Objective   Vitals:   02/22/21 0842 02/22/21 0900 02/22/21 1130 02/22/21 1547  BP: (!) 146/82  (!) 147/86 136/66  Pulse: 90  100 92  Resp: 20     Temp: 98.1 F (36.7 C)  97.6 F (36.4 C) 98.3 F (36.8 C)  TempSrc: Oral     SpO2: 96%  97% 97%  Weight:  72.2 kg    Height:        Intake/Output Summary (Last 24 hours) at 02/22/2021 1905 Last data filed at 02/22/2021 1019 Gross per 24 hour  Intake 240 ml  Output --  Net 240 ml   Filed Weights   02/21/21 1834 02/22/21 0650 02/22/21 0900  Weight: 72.9 kg 72.5 kg 72.2 kg    Physical Exam:  General exam: awake and alert, no acute distress Respiratory system: diminished bases, on Bipap, normal respiratory effort Cardiovascular system: normal S1/S2, RRR, no pedal edema.   Gastrointestinal system: soft, NT, ND, colostomy present with new empty bag Central nervous system: no gross focal neurologic deficits, normal speech   Labs   Data Reviewed: I have personally reviewed following labs and imaging studies  CBC: Recent Labs  Lab 02/19/21 0506 02/20/21 0412  02/21/21 0518 02/22/21 0519  WBC 21.6* 20.0* 17.5* 15.8*  NEUTROABS 20.0*  --  15.4* 13.4*  HGB 15.4 11.6* 13.7 12.6*  HCT 44.0 33.2* 39.9 34.9*  MCV 100.5* 104.1* 104.5* 101.5*  PLT 309 170 215 188   Basic Metabolic Panel: Recent Labs  Lab 02/19/21 0506 02/20/21 0412 02/21/21 0518 02/22/21 0519  NA 132* 135 136 138  K 4.5 3.3* 4.2 2.9*  CL 98 104 103 104  CO2 21* 22 21* 22  GLUCOSE 146* 81 92 90  BUN 13 15 16 18   CREATININE 0.80 0.75 0.84 0.70  CALCIUM 9.7 8.5* 8.9 8.7*  MG  --   --  1.7 1.7  PHOS  --   --  2.8  --    GFR: Estimated Creatinine Clearance: 75.7 mL/min (by C-G formula based on SCr of 0.7 mg/dL). Liver Function Tests: Recent Labs  Lab 02/19/21 0506  AST 56*  ALT 21  ALKPHOS 96  BILITOT 1.4*  PROT 7.8  ALBUMIN 3.4*   Recent Labs  Lab 02/19/21 0506  LIPASE 29   No results for input(s): AMMONIA in the last 168 hours. Coagulation Profile: No results for input(s): INR, PROTIME in the last 168 hours. Cardiac Enzymes: No results for input(s): CKTOTAL, CKMB, CKMBINDEX, TROPONINI in the last 168 hours. BNP (last 3 results) No results for input(s): PROBNP in the last 8760 hours. HbA1C: No results for input(s): HGBA1C in the last 72 hours. CBG: Recent Labs  Lab 02/20/21 1803  GLUCAP 125*   Lipid Profile: No results for input(s): CHOL, HDL, LDLCALC, TRIG, CHOLHDL, LDLDIRECT in the last 72 hours. Thyroid Function Tests: No results for input(s): TSH, T4TOTAL, FREET4, T3FREE, THYROIDAB in the last 72 hours. Anemia Panel: No results for input(s): VITAMINB12, FOLATE, FERRITIN, TIBC, IRON, RETICCTPCT in the last 72 hours. Sepsis Labs: Recent Labs  Lab 02/19/21 0509 02/19/21 0730 02/19/21 1202 02/21/21 0518 02/22/21 0519  PROCALCITON  --   --  1.50 1.26 0.54  LATICACIDVEN 3.1* 1.7  --   --   --     Recent Results (from the past 240 hour(s))  Resp Panel by RT-PCR (Flu A&B, Covid) Nasopharyngeal Swab     Status: None   Collection Time:  02/19/21  5:06 AM   Specimen: Nasopharyngeal Swab; Nasopharyngeal(NP) swabs in vial transport medium  Result Value Ref Range Status   SARS Coronavirus 2 by RT PCR NEGATIVE NEGATIVE Final    Comment: (NOTE) SARS-CoV-2 target nucleic acids are NOT DETECTED.  The SARS-CoV-2 RNA is generally detectable in upper respiratory specimens during the acute phase of infection. The lowest concentration of SARS-CoV-2 viral copies this  assay can detect is 138 copies/mL. A negative result does not preclude SARS-Cov-2 infection and should not be used as the sole basis for treatment or other patient management decisions. A negative result may occur with  improper specimen collection/handling, submission of specimen other than nasopharyngeal swab, presence of viral mutation(s) within the areas targeted by this assay, and inadequate number of viral copies(<138 copies/mL). A negative result must be combined with clinical observations, patient history, and epidemiological information. The expected result is Negative.  Fact Sheet for Patients:  EntrepreneurPulse.com.au  Fact Sheet for Healthcare Providers:  IncredibleEmployment.be  This test is no t yet approved or cleared by the [place] FDA and  has been authorized for detection and/or diagnosis of SARS-CoV-2 by FDA under an Emergency Use Authorization (EUA). This EUA will remain  in effect (meaning this test can be used) for the duration of the COVID-19 declaration under Section 564(b)(1) of the Act, 21 U.S.C.section 360bbb-3(b)(1), unless the authorization is terminated  or revoked sooner.       Influenza A by PCR NEGATIVE NEGATIVE Final   Influenza B by PCR NEGATIVE NEGATIVE Final    Comment: (NOTE) The Xpert Xpress SARS-CoV-2/FLU/RSV plus assay is intended as an aid in the diagnosis of influenza from Nasopharyngeal swab specimens and should not be used as a sole basis for treatment. Nasal washings  and aspirates are unacceptable for Xpert Xpress SARS-CoV-2/FLU/RSV testing.  Fact Sheet for Patients: EntrepreneurPulse.com.au  Fact Sheet for Healthcare Providers: IncredibleEmployment.be  This test is not yet approved or cleared by the [place] FDA and has been authorized for detection and/or diagnosis of SARS-CoV-2 by FDA under an Emergency Use Authorization (EUA). This EUA will remain in effect (meaning this test can be used) for the duration of the COVID-19 declaration under Section 564(b)(1) of the Act, 21 U.S.C. section 360bbb-3(b)(1), unless the authorization is terminated or revoked.  Performed at [facility], [address], [place], [place] [address]   Urine Culture     Status: Abnormal   Collection Time: 02/19/21  5:06 AM   Specimen: Urine, Random  Result Value Ref Range Status   Specimen Description   Final    URINE, RANDOM Performed at [facility], [address] [place], [place] [address]    Special Requests   Final    NONE Performed at [facility], [address] [place], [place] [address]    Culture >=100,000 COLONIES/mL ENTEROCOCCUS FAECALIS (A)  Final   Report Status 02/21/2021 FINAL  Final   Organism ID, Bacteria ENTEROCOCCUS FAECALIS (A)  Final      Susceptibility   Enterococcus faecalis - MIC*    AMPICILLIN <=2 SENSITIVE Sensitive     NITROFURANTOIN <=16 SENSITIVE Sensitive     VANCOMYCIN 1 SENSITIVE Sensitive     * >=100,000 COLONIES/mL ENTEROCOCCUS FAECALIS  Culture, blood (routine x 2)     Status: None (Preliminary result)   Collection Time: 02/19/21  5:12 AM   Specimen: BLOOD  Result Value Ref Range Status   Specimen Description BLOOD BLOOD RIGHT FOREARM  Final   Special Requests   Final    BOTTLES DRAWN AEROBIC AND ANAEROBIC Blood Culture adequate volume   Culture   Final    NO GROWTH 3 DAYS Performed at [facility], [address], [place],   [address]    Report Status PENDING  Incomplete  Culture, blood (routine x 2)     Status: None (Preliminary result)   Collection Time: 02/19/21  5:12 AM   Specimen: BLOOD  Result Value Ref Range Status   Specimen Description BLOOD LEFT ANTECUBITAL  Final   Special Requests   Final    BOTTLES DRAWN AEROBIC AND ANAEROBIC Blood Culture adequate volume   Culture   Final    NO GROWTH 3 DAYS Performed at [facility], [address] [place], [place] [address]    Report Status PENDING  Incomplete  MRSA PCR Screening     Status: None   Collection Time: 02/20/21  6:09 PM   Specimen: Nasopharyngeal  Result Value Ref Range Status   MRSA by PCR NEGATIVE NEGATIVE Final    Comment:        The GeneXpert MRSA Assay (FDA approved for NASAL specimens only), is one component of a comprehensive MRSA colonization surveillance program. It is not intended to diagnose MRSA infection nor to guide or monitor treatment for MRSA infections. Performed at [facility], [address] [place], [place] [address]       Imaging Studies   No results found.   Medications   Scheduled Meds: . amoxicillin-clavulanate  1 tablet Oral Q12H  . aspirin EC  81 mg Oral Daily  . bisacodyl  5 mg Oral BID  . chlordiazePOXIDE  10 mg Oral TID  . Chlorhexidine Gluconate Cloth  6 each Topical Daily  . folic acid  1 mg Oral Daily  . gabapentin  200 mg Oral TID  . heparin  5,000 Units Subcutaneous Q8H  . multivitamin with minerals  1 tablet Oral Daily  . nicotine  21 mg Transdermal Daily  . pantoprazole  40 mg Oral q1600  . polyethylene glycol  17 g Oral BID  . sertraline  25 mg Oral Daily  . thiamine  100 mg Oral Daily   Or  . thiamine  100 mg Intravenous Daily   Continuous Infusions: . sodium chloride 250 mL (02/21/21 2219)  . dexmedetomidine (PRECEDEX) IV infusion Stopped (02/20/21 2241)       LOS: 3 days    Time spent: 30 minutes with >50% spent at bedside and in coordination of  care.    Ezekiel Slocumb, DO [facility]  02/22/2021, 7:05 PM      If 7PM-7AM, please contact night-coverage. How to contact the [facility] Attending or Consulting provider [place] or covering provider during after hours [place], for this patient?    1. Check the care team in [facility] and look for a) attending/consulting [facility] provider listed and b) the [facility] team listed 2. Log into [email or web address] and use [facility] universal password to access. If you do not have the password, please contact the hospital operator. 3. Locate the [facility] provider you are looking for under [facility] and page to a number that you can be directly reached. 4. If you still have difficulty reaching the provider, please page the [facility] (Director on Call) for the Hospitalists listed on amion for assistance.

## 2021-02-22 NOTE — NC FL2 (Signed)
[place] LEVEL OF CARE SCREENING TOOL     IDENTIFICATION  Patient Name: Stephen [name] Birthdate: January 06, 1946 Sex: male Admission Date (Current Location): 02/19/2021  [facility] and [place] Number:  Engineering geologist and Address:         Provider Number: [id number]  Attending Physician Name and Address:  Ezekiel Slocumb, DO  Relative Name and Phone Number:       Current Level of Care: Hospital Recommended Level of Care: [place] Prior Approval Number:    Date Approved/Denied:   PASRR Number: [id number]  Discharge Plan: SNF    Current Diagnoses: Patient Active Problem List   Diagnosis Date Noted  . Malnutrition of moderate degree 02/20/2021  . Alcohol withdrawal syndrome ([place]) 02/20/2021  . Aspiration into lower respiratory tract, sequela 02/19/2021  . UTI (urinary tract infection) 02/19/2021  . Acute respiratory failure with hypoxia ([place]) 02/19/2021  . Aspiration pneumonia ([place]) 02/19/2021  . Partial small bowel obstruction ([place]) 02/19/2021  . Severe sepsis ([place]) 02/19/2021  . GERD (gastroesophageal reflux disease) 02/19/2021  . Depression 02/19/2021  . Alcohol use 02/19/2021  . Rectal cancer ([place]) 11/15/2013  . History of anemia   . HTN (hypertension)   . Smoker     Orientation RESPIRATION BLADDER Height & Weight     Self,Time,Situation,Place  O2 (2L [place]) Incontinent,External catheter Weight: 72.2 kg Height:  5\' 7"  (170.2 cm)  BEHAVIORAL SYMPTOMS/MOOD NEUROLOGICAL BOWEL NUTRITION STATUS      Colostomy Diet (Full liquid, plan to advance prior to discharge)  AMBULATORY STATUS COMMUNICATION OF NEEDS Skin   Extensive Assist Verbally Bruising                       Personal Care Assistance Level of Assistance              Functional Limitations Info             SPECIAL CARE FACTORS FREQUENCY                       Contractures Contractures Info: Not present    Additional Factors Info  Code  Status,Allergies Code Status Info: Full Allergies Info: NKDA           Current Medications (02/22/2021):  This is the current hospital active medication list Current Facility-Administered Medications  Medication Dose Route Frequency Provider Last Rate Last Admin  . 0.9 %  sodium chloride infusion   Intravenous PRN Nicole Kindred A, DO 10 mL/hr at 02/21/21 2219 250 mL at 02/21/21 2219  . acetaminophen (TYLENOL) suppository 650 mg  650 mg Rectal Q6H PRN Ivor Costa, MD      . acetaminophen (TYLENOL) tablet 650 mg  650 mg Oral Q6H PRN Ivor Costa, MD   650 mg at 02/22/21 0929  . albuterol (PROVENTIL) (2.5 MG/3ML) 0.083% nebulizer solution 2.5 mg  2.5 mg Nebulization Q4H PRN Ivor Costa, MD      . amoxicillin-clavulanate (AUGMENTIN) 875-125 MG per tablet 1 tablet  1 tablet Oral Q12H Nicole Kindred A, DO   1 tablet at 02/22/21 0929  . aspirin EC tablet 81 mg  81 mg Oral Daily Ivor Costa, MD   81 mg at 02/22/21 0929  . bisacodyl (DULCOLAX) EC tablet 5 mg  5 mg Oral BID Ivor Costa, MD   5 mg at 02/22/21 0930  . chlordiazePOXIDE (LIBRIUM) capsule 10 mg  10 mg Oral TID Nicole Kindred A, DO      .  Chlorhexidine Gluconate Cloth 2 % PADS 6 each  6 each Topical Daily Nicole Kindred A, DO   6 each at 02/22/21 0930  . dexmedetomidine (PRECEDEX) 200 MCG/50ML (4 mcg/mL) infusion  0.2-0.7 mcg/kg/hr Intravenous Continuous Nicole Kindred A, DO   Stopped at 02/20/21 2241  . dextromethorphan-guaiFENesin (MUCINEX DM) 30-600 MG per 12 hr tablet 1 tablet  1 tablet Oral BID PRN Ivor Costa, MD      . folic acid (FOLVITE) tablet 1 mg  1 mg Oral Daily Ivor Costa, MD   1 mg at 02/22/21 0929  . gabapentin (NEURONTIN) capsule 200 mg  200 mg Oral TID Nicole Kindred A, DO   200 mg at 02/22/21 0929  . heparin injection 5,000 Units  5,000 Units Subcutaneous Cleophas Dunker, MD   5,000 Units at 02/22/21 2355  . hydrALAZINE (APRESOLINE) injection 10 mg  10 mg Intravenous Q6H PRN Sharion Settler, NP      . loratadine (CLARITIN)  tablet 10 mg  10 mg Oral Daily PRN Ivor Costa, MD      . LORazepam (ATIVAN) tablet 1-4 mg  1-4 mg Oral Q4H PRN Nicole Kindred A, DO       Or  . LORazepam (ATIVAN) injection 1-4 mg  1-4 mg Intravenous Q4H PRN Nicole Kindred A, DO      . morphine 2 MG/ML injection 0.5 mg  0.5 mg Intravenous Q4H PRN Ivor Costa, MD      . multivitamin with minerals tablet 1 tablet  1 tablet Oral Daily Ivor Costa, MD   1 tablet at 02/22/21 0929  . nicotine (NICODERM CQ - dosed in mg/24 hours) patch 21 mg  21 mg Transdermal Daily Ivor Costa, MD   21 mg at 02/22/21 0930  . ondansetron (ZOFRAN) injection 4 mg  4 mg Intravenous Q8H PRN Ivor Costa, MD   4 mg at 02/20/21 1458  . oxyCODONE-acetaminophen (PERCOCET) 7.5-325 MG per tablet 1 tablet  1 tablet Oral Q6H PRN Ivor Costa, MD   1 tablet at 02/19/21 1514  . pantoprazole (PROTONIX) EC tablet 40 mg  40 mg Oral q1600 Nicole Kindred A, DO   40 mg at 02/21/21 1538  . polyethylene glycol (MIRALAX / GLYCOLAX) packet 17 g  17 g Oral BID Ivor Costa, MD   17 g at 02/22/21 0931  . potassium chloride SA (KLOR-CON) CR tablet 40 mEq  40 mEq Oral Q4H Nicole Kindred A, DO   40 mEq at 02/22/21 0929  . sertraline (ZOLOFT) tablet 25 mg  25 mg Oral Daily Ivor Costa, MD   25 mg at 02/22/21 0929  . thiamine tablet 100 mg  100 mg Oral Daily Ivor Costa, MD   100 mg at 02/22/21 0930   Or  . thiamine (B-1) injection 100 mg  100 mg Intravenous Daily Ivor Costa, MD   100 mg at 02/21/21 7322     Discharge Medications: Please see discharge summary for a list of discharge medications.  Relevant Imaging Results:  Relevant Lab Results:   Additional Information ss [id number]  Beverly Sessions, RN

## 2021-02-22 NOTE — Evaluation (Signed)
Objective Swallowing Evaluation: Type of Study: MBS-Modified Barium Swallow Study   Patient Details  Name: Stephen [name] MRN: [id number] Date of Birth: 1946-09-24  Today's Date: 02/22/2021 Time: SLP Start Time (ACUTE ONLY): 1200 -SLP Stop Time (ACUTE ONLY): 1300  SLP Time Calculation (min) (ACUTE ONLY): 60 min   Past Medical History:  Past Medical History:  Diagnosis Date  . Allergic rhinitis   . Blood clot in vein 2015   left leg   . History of anemia   . History of chicken pox   . HTN (hypertension)   . Neuropathy    arms and legs - S/P Cancer tx  . 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 Smoker    Past Surgical History:  Past Surgical History:  Procedure Laterality Date  . COLON RESECTION  2015  . COLONOSCOPY  2015  . EUS N/A 11/15/2013   Procedure: LOWER ENDOSCOPIC ULTRASOUND (EUS);  Surgeon: Milus Banister, MD;  Location: [facility];  Service: Endoscopy;  Laterality: N/A;  . LUNG BIOPSY    . LUNG LOBECTOMY Left   . lung removed  2015   left lower   . PLANTAR FASCIA RELEASE Right 09/20/2017   Procedure: PLANTAR FASCIA [id number], excision plantar fibroma right foot;  Surgeon: Samara Deist, DPM;  Location: [place];  Service: Podiatry;  Laterality: Right;  . TONSILLECTOMY AND ADENOIDECTOMY  Childhood  . VARICOSE VEIN SURGERY  2010   HPI: Pt is a 75 y.o. male with medical history significant of rectal Cancer metastasized to lung (s/p for colostomy, chemotherapy), HTn, GERD, depression, GERD, smoker, ETOH use, left leg blood clot not on anticoagulants, who presents with constipation, nausea, vomiting, abdominal pain, weakness.  Per patient and his wife, patient has been generalized weakness for almost 2 weeks. Wife endorses pt does not hydrate well at Baseline.  He has been constipated for more than a week. He developed nausea, vomiting and abdominal pain. He has 6-7 times of nonbilious nonbloody vomiting.  He has diffuse, mild  to moderate abdominal pain.  No fever or chills.  Patient also has dry cough, shortness of breath.  No chest pain.  Patient complains of dysuria, burning on urination and increased urinary frequency.  Concern for Dehydration per Wife.  He continues to drink Alcohol heavily per his wife.  Pt and Wife c/o pt being "shaky" in his UEs unable to feed himself as well as his [place] being "very dark".   Per CT of Abd.: "Partial small bowel obstruction without focal transition point.  2. Small volume of reactive ascites in the right pelvis in the  region of dilated and fluid-filled small bowel.  3. Bilateral lower lobe atelectasis versus small volume aspiration  superimposed on a background of diffuse chronic bronchial wall  thickening.  4. Mild circumferential thickening of the distal esophagus  concerning for esophagitis.".   Subjective: pt awake, verbal w/ few responses, but intermittently distracted w/ cues to needed to redirect attention. Smiled a lot. Leaned. need cues to follow through.    Assessment / Plan / Recommendation  CHL IP CLINICAL IMPRESSIONS 02/22/2021  Clinical Impression Pt presents with moderate oropharyngeal phase dysphagia, impacted by pt's Cognitive decline/status(eyes closed often during study; redirection to task often). Pt also appears weak and fatigued and required verbal cues to sit upright and needed support w/ holding Cup when drinking. This presentation can increased risk for aspiration, choking, as well as decreased oral intake overall. Oral phase of swallowing  characterized by prolonged and disorganized bolus formation, min+ weak lingual manipulation, lingual pumping, and delayed anterior to posterior transit. Intermittent min+, diffuse oral residue remained w/ all trials given. During the Pharyngeal phase of swallowing, swallow initiation was delayed moreso w/ liquids -- both thin and Nectar liquids spilled to the level of the Pyriform Sinuses (Mod+ w/ thin liquids) resulting in  Aspiration before/during the swallow w/ thin liquids. Pt exhibited a delyed, mild, half-Cough. He was further cued to use a "strong" cough to aid airway clearing of aspirated bolus material. Despite the delay, No aspiration or laryngeal penetration noted w/ single sips of Nectar liquids, or puree trials. No solid foods were assessed d/t GI status. Post swallow, diffuse pharyngeal residue noted throughout even along posteriorl pharyngeal wall at level of pyriform sinuses indicating reducued tongue base retraction, hyolaryngeal excursion and pharyngeal constriction. Pt had consistent Valleculae residue. A f/u, Dry swallow appeared to aid in reducing oropharyngeal residue -- required Time and Verbal Cues for follow through w/ strategy. Cervical esophageal phase was unremarkable given limited view due to pt's positioning. Given apparent Ccognitive decline and deconditioning, recommend Modifying liquid to Nectar consistency, meds crushed in puree, full supervision w/ po's. SLP will follow up at bedside for toleration of diet. Reviewed results of MBSS w/ pt, then Team and posted precautions in room. Pt has a history of ETOH abuse which may impact any upgrade in diet consistency.  SLP Visit Diagnosis Dysphagia, oropharyngeal phase (R13.12)  Attention and concentration deficit following --  Frontal lobe and executive function deficit following --  Impact on safety and function Moderate aspiration risk;Risk for inadequate nutrition/hydration      CHL IP TREATMENT RECOMMENDATION 02/22/2021  Treatment Recommendations Therapy as outlined in treatment plan below     Prognosis 02/22/2021  Prognosis for Safe Diet Advancement Guarded  Barriers to Reach Goals Cognitive deficits;Time post onset;Severity of deficits;Behavior  Barriers/Prognosis Comment unsure of baseline Cognitive status    CHL IP DIET RECOMMENDATION 02/22/2021  SLP Diet Recommendations No solids, see liquids;Nectar thick liquid  Liquid Administration  via Cup;No straw  Medication Administration Crushed with puree  Compensations Minimize environmental distractions;Slow rate;Small sips/bites;Lingual sweep for clearance of pocketing;Multiple dry swallows after each bite/sip;Follow solids with liquid  Postural Changes Remain semi-upright after after feeds/meals (Comment);Seated upright at 90 degrees      CHL IP OTHER RECOMMENDATIONS 02/22/2021  Recommended Consults (No Data)  Oral Care Recommendations Oral care BID;Oral care before and after PO;Staff/trained caregiver to provide oral care  Other Recommendations Order thickener from pharmacy;Prohibited food (jello, ice cream, thin soups);Remove water pitcher;Have oral suction available      CHL IP FOLLOW UP RECOMMENDATIONS 02/22/2021  Follow up Recommendations Skilled Nursing facility      [facility] IP FREQUENCY AND DURATION 02/22/2021  Speech Therapy Frequency (ACUTE ONLY) min 2x/week  Treatment Duration 1 week           CHL IP ORAL PHASE 02/22/2021  Oral Phase Impaired  Oral - Pudding Teaspoon --  Oral - Pudding Cup --  Oral - Honey Teaspoon --  Oral - Honey Cup --  Oral - Nectar Teaspoon --  Oral - Nectar Cup --  Oral - Nectar Straw --  Oral - Thin Teaspoon --  Oral - Thin Cup --  Oral - Thin Straw --  Oral - Puree --  Oral - Mech Soft --  Oral - Regular --  Oral - Multi-Consistency --  Oral - Pill --  Oral Phase - Comment disorganized; slow A-P  transfer; diffuse oreal residue    CHL IP PHARYNGEAL PHASE 02/22/2021  Pharyngeal Phase Impaired  Pharyngeal- Pudding Teaspoon --  Pharyngeal --  Pharyngeal- Pudding Cup --  Pharyngeal --  Pharyngeal- Honey Teaspoon --  Pharyngeal --  Pharyngeal- Honey Cup --  Pharyngeal --  Pharyngeal- Nectar Teaspoon --  Pharyngeal --  Pharyngeal- Nectar Cup --  Pharyngeal --  Pharyngeal- Nectar Straw --  Pharyngeal --  Pharyngeal- Thin Teaspoon --  Pharyngeal --  Pharyngeal- Thin Cup --  Pharyngeal --  Pharyngeal- Thin Straw --   Pharyngeal --  Pharyngeal- Puree --  Pharyngeal --  Pharyngeal- Mechanical Soft --  Pharyngeal --  Pharyngeal- Regular --  Pharyngeal --  Pharyngeal- Multi-consistency --  Pharyngeal --  Pharyngeal- Pill --  Pharyngeal --  Pharyngeal Comment delayed pharyngeal swallow initiation moreso w/ liquids; decreased laryngeal excursion and pharyngeal pressure during the swallow; diffuse pharyngeal residue     CHL IP CERVICAL ESOPHAGEAL PHASE 02/22/2021  Cervical Esophageal Phase WFL  Pudding Teaspoon --  Pudding Cup --  Honey Teaspoon --  Honey Cup --  Nectar Teaspoon --  Nectar Cup --  Nectar Straw --  Thin Teaspoon --  Thin Cup --  Thin Straw --  Puree --  Mechanical Soft --  Regular --  Multi-consistency --  Pill --  Cervical Esophageal Comment --         Orinda Kenner, MS, CCC-SLP Speech Language Pathologist Rehab Services [phone] [facility] 02/22/2021, 3:54 PM

## 2021-02-22 NOTE — TOC Initial Note (Signed)
Transition of Care [facility]) - Initial/Assessment Note    Patient Details  Name: Stephen [name] MRN: [id number] Date of Birth: 1946-06-05  Transition of Care [facility]) CM/SW Contact:    Beverly Sessions, RN Phone Number: 02/22/2021, 3:02 PM  Clinical Narrative:                  Patient admitted from home with SBO Patient lives at home with wife PCP Clemmie Krill-  Wife transports Denies issues obtaining medications  Patient has hospital bed and RW in the home  PT and OT have evaluated patient and recommends SNF.  Patient agreeable to bed search PASRR obtained fl2 sent for signature.  Bed search initiated  With patient's permission called and updated wife  Patient does state that he drinks 1-2 liquor drinks a day.  Discscussed substance abuse resources.  Patient declines  Expected Discharge Plan: [place] Barriers to Discharge: Continued Medical Work up   Patient Goals and CMS Choice        Expected Discharge Plan and Services Expected Discharge Plan: [place]   Discharge Planning Services: CM Consult   Living arrangements for the past 2 months: Single Family Home                                      Prior Living Arrangements/Services Living arrangements for the past 2 months: Single Family Home Lives with:: Spouse Patient language and need for interpreter reviewed:: Yes Do you feel safe going back to the place where you live?: Yes      Need for Family Participation in Patient Care: Yes (Comment) Care giver support system in place?: Yes (comment) Current home services: DME Criminal Activity/Legal Involvement Pertinent to Current Situation/Hospitalization: No - Comment as needed  Activities of Daily Living Home Assistive Devices/Equipment: Walker (specify type),Wheelchair,Shower chair with back ADL Screening (condition at time of admission) Patient's cognitive ability adequate to safely complete daily activities?: Yes Is the patient  deaf or have difficulty hearing?: No Does the patient have difficulty seeing, even when wearing glasses/contacts?: No Does the patient have difficulty concentrating, remembering, or making decisions?: No Patient able to express need for assistance with ADLs?: Yes Does the patient have difficulty dressing or bathing?: Yes Independently performs ADLs?: No Communication: Independent Dressing (OT): Needs assistance Is this a change from baseline?: Pre-admission baseline Grooming: Needs assistance Is this a change from baseline?: Pre-admission baseline Feeding: Independent Bathing: Needs assistance Is this a change from baseline?: Pre-admission baseline Toileting: Needs assistance Is this a change from baseline?: Pre-admission baseline In/Out Bed: Needs assistance Is this a change from baseline?: Pre-admission baseline Walks in Home: Dependent Is this a change from baseline?: Pre-admission baseline Does the patient have difficulty walking or climbing stairs?: No Weakness of Legs: None Weakness of Arms/Hands: None  Permission Sought/Granted                  Emotional Assessment       Orientation: : Oriented to Self,Oriented to Place,Oriented to  Time,Oriented to Situation Alcohol / Substance Use: Alcohol Use Psych Involvement: No (comment)  Admission diagnosis:  Lower urinary tract infectious disease [N39.0] Aspiration pneumonia ([place]) [J69.0] Generalized abdominal pain [R10.84] Hypoxia [R09.02] Aspiration into lower respiratory tract, sequela [T17.800S] Constipation, unspecified constipation type [K59.00] Non-intractable vomiting with nausea, unspecified vomiting type [R11.2] Aspiration pneumonia of right lung, unspecified aspiration pneumonia type, unspecified part of lung ([place]) [J69.0] Sepsis, due  to unspecified organism, unspecified whether acute organ dysfunction present [facility]) [A41.9] Patient Active Problem List   Diagnosis Date Noted  . Malnutrition of moderate  degree 02/20/2021  . Alcohol withdrawal syndrome ([place]) 02/20/2021  . Aspiration into lower respiratory tract, sequela 02/19/2021  . UTI (urinary tract infection) 02/19/2021  . Acute respiratory failure with hypoxia ([place]) 02/19/2021  . Aspiration pneumonia ([place]) 02/19/2021  . Partial small bowel obstruction ([place]) 02/19/2021  . Severe sepsis ([place]) 02/19/2021  . GERD (gastroesophageal reflux disease) 02/19/2021  . Depression 02/19/2021  . Alcohol use 02/19/2021  . Rectal cancer ([place]) 11/15/2013  . History of anemia   . HTN (hypertension)   . Smoker    PCP:  [name], MD Pharmacy:   [id number] - [place], [place] [address] [place] [address] Phone: [phone] Fax: [phone]     Social Determinants of Health (SDOH) Interventions    Readmission Risk Interventions No flowsheet data found.

## 2021-02-23 LAB — CBC WITH DIFFERENTIAL/PLATELET
Abs Immature Granulocytes: 0.16 10*3/uL — ABNORMAL HIGH (ref 0.00–0.07)
Basophils Absolute: 0.1 10*3/uL (ref 0.0–0.1)
Basophils Relative: 1 %
Eosinophils Absolute: 0.1 10*3/uL (ref 0.0–0.5)
Eosinophils Relative: 1 %
HCT: 33.5 % — ABNORMAL LOW (ref 39.0–52.0)
Hemoglobin: 11.6 g/dL — ABNORMAL LOW (ref 13.0–17.0)
Immature Granulocytes: 1 %
Lymphocytes Relative: 7 %
Lymphs Abs: 1 10*3/uL (ref 0.7–4.0)
MCH: 35.5 pg — ABNORMAL HIGH (ref 26.0–34.0)
MCHC: 34.6 g/dL (ref 30.0–36.0)
MCV: 102.4 fL — ABNORMAL HIGH (ref 80.0–100.0)
Monocytes Absolute: 1.3 10*3/uL — ABNORMAL HIGH (ref 0.1–1.0)
Monocytes Relative: 9 %
Neutro Abs: 12 10*3/uL — ABNORMAL HIGH (ref 1.7–7.7)
Neutrophils Relative %: 81 %
Platelets: 231 10*3/uL (ref 150–400)
RBC: 3.27 MIL/uL — ABNORMAL LOW (ref 4.22–5.81)
RDW: 13.5 % (ref 11.5–15.5)
WBC: 14.6 10*3/uL — ABNORMAL HIGH (ref 4.0–10.5)
nRBC: 0 % (ref 0.0–0.2)

## 2021-02-23 LAB — BASIC METABOLIC PANEL
Anion gap: 10 (ref 5–15)
BUN: 19 mg/dL (ref 8–23)
CO2: 23 mmol/L (ref 22–32)
Calcium: 8.9 mg/dL (ref 8.9–10.3)
Chloride: 105 mmol/L (ref 98–111)
Creatinine, Ser: 0.58 mg/dL — ABNORMAL LOW (ref 0.61–1.24)
GFR, Estimated: >60 mL/min (ref >60)
Glucose, Bld: 91 mg/dL (ref 70–99)
Potassium: 2.9 mmol/L — ABNORMAL LOW (ref 3.5–5.1)
Sodium: 138 mmol/L (ref 135–145)

## 2021-02-23 LAB — LEGIONELLA PNEUMOPHILA SEROGP 1 UR AG: L. pneumophila Serogp 1 Ur Ag: NEGATIVE

## 2021-02-23 LAB — MAGNESIUM: Magnesium: 1.9 mg/dL (ref 1.7–2.4)

## 2021-02-23 MED ORDER — POTASSIUM CHLORIDE 20 MEQ PO PACK
40.0000 meq | PACK | ORAL | Status: AC
Start: 2021-02-23 — End: 2021-02-23
  Administered 2021-02-23 (×3): 40 meq via ORAL
  Filled 2021-02-23 (×3): qty 2

## 2021-02-23 MED ORDER — ENOXAPARIN SODIUM 40 MG/0.4ML ~~LOC~~ SOLN
40.0000 mg | SUBCUTANEOUS | Status: DC
  Administered 2021-02-23: 40 mg via SUBCUTANEOUS
  Filled 2021-02-23: qty 0.4

## 2021-02-23 NOTE — Progress Notes (Signed)
PROGRESS NOTE    Stephen [name]   DGU:[id number]  DOB: Jun 08, 1946  PCP: [name], MD    DOA: 02/19/2021 LOS: 4   Brief Narrative   Stephen [name] is a 75 y.o. 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  He was found to have a partial small bowel obstruction on CT abdomen/pelvis without a focal transition point.  Labs showed marked leukocytosis 21.6k, lactic acidosis 3.1, negative for Covid-19.  UA was consistent with infection.  Chest xray concerning for PNA with Right basilar opacity.  Admitted to hospitalist service.  General surgery consulted.  4/9 AM - pt reported having BM after getting Miralax.  Abdominal symptoms better.  Endorses some dysuria.  Denies respiratory symptoms.      Assessment & Plan   Principal Problem:   Severe sepsis ([place]) Active Problems:   UTI (urinary tract infection)   Acute respiratory failure with hypoxia (HCC)   Aspiration pneumonia (HCC)   Partial small bowel obstruction (HCC)   Alcohol withdrawal syndrome (HCC)   HTN (hypertension)   Alcohol use   Malnutrition of moderate degree   Smoker   Rectal cancer (HCC)   GERD (gastroesophageal reflux disease)   Depression   Alcohol use disorder with  Acute alcohol withdrawal syndrome 4/9 afternoon-early evening pt febrile, tachycardic, tachypneic, hallucinating, high CIWA scores. Briefly required Precedex. Wife reports at least six liquor/whiskey drink daily, thinks more recently.   4/11-12 mentation near his baseline per wife --Off Librium --Continue gabapentin --Continue CIWA protocol with PRN Ativan --High dose IV thiamine, ?Wernicke's encephalopathy He seems improved  with this, continue. 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  Dysphagia - SLP following.  MBSS done, see report.   --Diet per SLP recommendation.   --Aspiration and reflux precautions. --Supervise all PO intake. Pt is very high risk for aspiration and subsequent respiratory compromise.  Pt and wife educated on this extensively, but want pt to have regular liquids and stated williningness to accept the risk of aspiration.  Generlized Weakness / Physical Debility - SNF recommended by therapy.  Wife and pt decided on home with HH.  Unclear wife will have enough support at home to care for him adequately.  Partial SBO -present on admission.  Resolved, return of bowel function after Miralax. General surgery consulted, signed off. NG tube was not indicated, nor surgical intervention.  Severe sepsis due to UTI and pneumonia -sepsis present on admission with leukocytosis, tachycardia, tachypnea.  Lactic acidosis consistent with severe sepsis.  Treated per sepsis protocol in the ED.  Hemodynamically stable. --On Augementin (initially Zosyn)  --Off IV fluids --Follow blood, urine and sputum cultures   UTI -present admission. UA with large leukocytes, rare bacteria, >50 WBCs. Urine culture grew Enterococcus faecalis. --Abs: Zosyn >> Augmentin (4/11) --Urine culture growing Enterococcus faecalis --Follow-up culture susceptibilities  Community-acquired pneumonia versus aspiration -present on admission. Chest x-ray showed hazy pulmonary opacity with asymmetric right-sided involvement more concerning for pneumonia or aspiration. Evaluated by SLP, deemed mild aspiration risk, regular diet consistency recommended with general aspiration and reflux precautions. Negative for COVID-19 and influenza A and B. --Abx: Zosyn >> Augmentin (4/11) --Follow-up sputum culture --Supportive care: Mucinex,  albuterol, incentive spirometer,  flutter valve  Acute respiratory failure with hypoxia -patient presented with tachypnea, x-ray findings concerning for pneumonia, O2 saturation 88% on room air.  Oxygenation proved on 2 L/min nasal cannula oxygen. 4/9: Weaned to room air with sats 91 to 93% --Monitor closely --Supplemental oxygen as needed to maintain sats greater than 90%  Likely OSA - CPAP or BiPAP overnight  Hypokalemia - K 3.3 on 4/9, replaced.  Resolved. --Monitor BMP and replace as needed.    Essential hypertension -chronic, stable.  Home Benicar is held due to sepsis and risk of hypotension. 4/9: BP normal with meds held --Continue home Benicar monitor BP, resume when indicated --As needed hydralazine  Tobacco abuse -nicotine patch ordered.    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  Reportedly finished chemotherapy in 2016. Currently no acute issues. --Palliative care consulted for goals of care discussion  GERD -continue Protonix  Depression -continue Zoloft  Lumbar compression fracture -seen on CT abdomen pelvis, L4 and L5 compression fractures new when compared To imaging from June 2021.  These have occurred in the interim.  No acute fractures seen. --Continue home Tylenol and as needed Percocet    Patient BMI: Body mass index is 24.93 kg/m.   DVT prophylaxis: enoxaparin (LOVENOX) injection 40 mg Start: 02/23/21 2200   Diet:  Diet Orders (From admission, onward)    Start     Ordered   02/22/21 1326  Diet full liquid Room service appropriate? Yes with Assist; Fluid consistency: Nectar Thick  Diet effective now       Comments: NO STRAWS!  Extra Cup for thickened milk.  Question Answer Comment  Room service appropriate? Yes with Assist   Fluid consistency: Nectar Thick      02/22/21 1326            Code Status: Full Code    Subjective 02/23/21    Patient awake laying in bed.  Wife not present at the time.  Pt reports he feels well.  Agrees he does get choked with  swallowing at times, for long time.  Denies fever/chills, N/V/D, CP, SOB.  Denies dysuria, say that's gone now.  No other complaints.    Disposition Plan & Communication   Status is: Inpatient  Inpatient status remains appropriate due to severity of illness: ongoing encephalopathy, dysphagia, ongoing evaluation  Dispo: The patient is from: Home              Anticipated d/c is to: Home w [facility]               Patient currently not medically stable for discharge   Difficult to place patient no  Family communication: wife at bedside on rounds   Consults, Procedures, Significant Events   Consultants:   General surgery  Procedures:   None  Antimicrobials:  Anti-infectives (From admission, onward)   Start     Dose/Rate Route Frequency Ordered Stop   02/22/21 1000  amoxicillin-clavulanate (AUGMENTIN) 875-125 MG per tablet 1 tablet        1 tablet Oral Every 12 hours 02/22/21 0904 02/26/21 0959   02/19/21 1400  piperacillin-tazobactam (ZOSYN) IVPB 3.375 g  Status:  Discontinued        3.375 g 12.5 mL/hr over 240 Minutes Intravenous Every 8 hours 02/19/21 0728 02/22/21 0904   02/19/21 0600  piperacillin-tazobactam (ZOSYN) IVPB 3.375 g        3.375 g 100 mL/hr over 30 Minutes Intravenous  Once 02/19/21 0545 02/19/21  5621        Micro    Objective   Vitals:   02/23/21 0510 02/23/21 0802 02/23/21 1148 02/23/21 1552  BP:  120/70 111/71 131/76  Pulse:  91 86 84  Resp:    20  Temp:  99.1 F (37.3 C) 98.2 F (36.8 C) 98.6 F (37 C)  TempSrc:  Axillary Axillary Axillary  SpO2: 91% 91% 93% 96%  Weight:      Height:        Intake/Output Summary (Last 24 hours) at 02/23/2021 1812 Last data filed at 02/23/2021 1406 Gross per 24 hour  Intake 377.9 ml  Output 300 ml  Net 77.9 ml   Filed Weights   02/21/21 1834 02/22/21 0650 02/22/21 0900  Weight: 72.9 kg 72.5 kg 72.2 kg    Physical Exam:  General exam: awake and alert, laying in bed, no acute distress Respiratory  system: diminished bases, normal respiratory effort Cardiovascular system: normal S1/S2, RRR, no pedal edema.   Gastrointestinal system: soft, NT, ND, colostomy present with small amount brown stool Extremities: moves all, normal tone, no cyanosis Psychiatric: normal mood, congruent affect, abnormal judgment and insight   Labs   Data Reviewed: I have personally reviewed following labs and imaging studies  CBC: Recent Labs  Lab 02/19/21 0506 02/20/21 0412 02/21/21 0518 02/22/21 0519 02/23/21 0507  WBC 21.6* 20.0* 17.5* 15.8* 14.6*  NEUTROABS 20.0*  --  15.4* 13.4* 12.0*  HGB 15.4 11.6* 13.7 12.6* 11.6*  HCT 44.0 33.2* 39.9 34.9* 33.5*  MCV 100.5* 104.1* 104.5* 101.5* 102.4*  PLT 309 170 215 222 308   Basic Metabolic Panel: Recent Labs  Lab 02/19/21 0506 02/20/21 0412 02/21/21 0518 02/22/21 0519 02/23/21 0507  NA 132* 135 136 138 138  K 4.5 3.3* 4.2 2.9* 2.9*  CL 98 104 103 104 105  CO2 21* 22 21* 22 23  GLUCOSE 146* 81 92 90 91  BUN 13 15 16 18 19   CREATININE 0.80 0.75 0.84 0.70 0.58*  CALCIUM 9.7 8.5* 8.9 8.7* 8.9  MG  --   --  1.7 1.7 1.9  PHOS  --   --  2.8  --   --    GFR: Estimated Creatinine Clearance: 75.7 mL/min (A) (by C-G formula based on SCr of 0.58 mg/dL (L)). Liver Function Tests: Recent Labs  Lab 02/19/21 0506  AST 56*  ALT 21  ALKPHOS 96  BILITOT 1.4*  PROT 7.8  ALBUMIN 3.4*   Recent Labs  Lab 02/19/21 0506  LIPASE 29   No results for input(s): AMMONIA in the last 168 hours. Coagulation Profile: No results for input(s): INR, PROTIME in the last 168 hours. Cardiac Enzymes: No results for input(s): CKTOTAL, CKMB, CKMBINDEX, TROPONINI in the last 168 hours. BNP (last 3 results) No results for input(s): PROBNP in the last 8760 hours. HbA1C: No results for input(s): HGBA1C in the last 72 hours. CBG: Recent Labs  Lab 02/20/21 1803  GLUCAP 125*   Lipid Profile: No results for input(s): CHOL, HDL, LDLCALC, TRIG, CHOLHDL, LDLDIRECT in  the last 72 hours. Thyroid Function Tests: No results for input(s): TSH, T4TOTAL, FREET4, T3FREE, THYROIDAB in the last 72 hours. Anemia Panel: No results for input(s): VITAMINB12, FOLATE, FERRITIN, TIBC, IRON, RETICCTPCT in the last 72 hours. Sepsis Labs: Recent Labs  Lab 02/19/21 0509 02/19/21 0730 02/19/21 1202 02/21/21 0518 02/22/21 0519  PROCALCITON  --   --  1.50 1.26 0.54  LATICACIDVEN 3.1* 1.7  --   --   --  Recent Results (from the past 240 hour(s))  Resp Panel by RT-PCR (Flu A&B, Covid) Nasopharyngeal Swab     Status: None   Collection Time: 02/19/21  5:06 AM   Specimen: Nasopharyngeal Swab; Nasopharyngeal(NP) swabs in vial transport medium  Result Value Ref Range Status   SARS Coronavirus 2 by RT PCR NEGATIVE NEGATIVE Final    Comment: (NOTE) SARS-CoV-2 target nucleic acids are NOT DETECTED.  The SARS-CoV-2 RNA is generally detectable in upper respiratory specimens during the acute phase of infection. The lowest concentration of SARS-CoV-2 viral copies this assay can detect is 138 copies/mL. A negative result does not preclude SARS-Cov-2 infection and should not be used as the sole basis for treatment or other patient management decisions. A negative result may occur with  improper specimen collection/handling, submission of specimen other than nasopharyngeal swab, presence of viral mutation(s) within the areas targeted by this assay, and inadequate number of viral copies(<138 copies/mL). A negative result must be combined with clinical observations, patient history, and epidemiological information. The expected result is Negative.  Fact Sheet for Patients:  EntrepreneurPulse.com.au  Fact Sheet for Healthcare Providers:  IncredibleEmployment.be  This test is no t yet approved or cleared by the [place] FDA and  has been authorized for detection and/or diagnosis of SARS-CoV-2 by FDA under an Emergency Use  Authorization (EUA). This EUA will remain  in effect (meaning this test can be used) for the duration of the COVID-19 declaration under Section 564(b)(1) of the Act, 21 U.S.C.section 360bbb-3(b)(1), unless the authorization is terminated  or revoked sooner.       Influenza A by PCR NEGATIVE NEGATIVE Final   Influenza B by PCR NEGATIVE NEGATIVE Final    Comment: (NOTE) The Xpert Xpress SARS-CoV-2/FLU/RSV plus assay is intended as an aid in the diagnosis of influenza from Nasopharyngeal swab specimens and should not be used as a sole basis for treatment. Nasal washings and aspirates are unacceptable for Xpert Xpress SARS-CoV-2/FLU/RSV testing.  Fact Sheet for Patients: EntrepreneurPulse.com.au  Fact Sheet for Healthcare Providers: IncredibleEmployment.be  This test is not yet approved or cleared by the [place] FDA and has been authorized for detection and/or diagnosis of SARS-CoV-2 by FDA under an Emergency Use Authorization (EUA). This EUA will remain in effect (meaning this test can be used) for the duration of the COVID-19 declaration under Section 564(b)(1) of the Act, 21 U.S.C. section 360bbb-3(b)(1), unless the authorization is terminated or revoked.  Performed at [facility], [address] [place], [place] [address]   Urine Culture     Status: Abnormal   Collection Time: 02/19/21  5:06 AM   Specimen: Urine, Random  Result Value Ref Range Status   Specimen Description   Final    URINE, RANDOM Performed at [facility], [address] [place], [place] [address]    Special Requests   Final    NONE Performed at [facility], [address] [place],  [address]    Culture >=100,000 COLONIES/mL ENTEROCOCCUS FAECALIS (A)  Final   Report Status 02/21/2021 FINAL  Final   Organism ID, Bacteria ENTEROCOCCUS FAECALIS (A)  Final      Susceptibility   Enterococcus faecalis - MIC*    AMPICILLIN  <=2 SENSITIVE Sensitive     NITROFURANTOIN <=16 SENSITIVE Sensitive     VANCOMYCIN 1 SENSITIVE Sensitive     * >=100,000 COLONIES/mL ENTEROCOCCUS FAECALIS  Culture, blood (routine x 2)     Status: None (Preliminary result)   Collection Time:  02/19/21  5:12 AM   Specimen: BLOOD  Result Value Ref Range Status   Specimen Description BLOOD BLOOD RIGHT FOREARM  Final   Special Requests   Final    BOTTLES DRAWN AEROBIC AND ANAEROBIC Blood Culture adequate volume   Culture   Final    NO GROWTH 4 DAYS Performed at [facility], [address] [place], [place] [address]    Report Status PENDING  Incomplete  Culture, blood (routine x 2)     Status: None (Preliminary result)   Collection Time: 02/19/21  5:12 AM   Specimen: BLOOD  Result Value Ref Range Status   Specimen Description BLOOD LEFT ANTECUBITAL  Final   Special Requests   Final    BOTTLES DRAWN AEROBIC AND ANAEROBIC Blood Culture adequate volume   Culture   Final    NO GROWTH 4 DAYS Performed at [facility], [address] [place], [place] [address]    Report Status PENDING  Incomplete  MRSA PCR Screening     Status: None   Collection Time: 02/20/21  6:09 PM   Specimen: Nasopharyngeal  Result Value Ref Range Status   MRSA by PCR NEGATIVE NEGATIVE Final    Comment:        The GeneXpert MRSA Assay (FDA approved for NASAL specimens only), is one component of a comprehensive MRSA colonization surveillance program. It is not intended to diagnose MRSA infection nor to guide or monitor treatment for MRSA infections. Performed at [facility], [address] [place], [place] [address]       Imaging Studies   US Venous Img Upper Uni Right(DVT)  Result Date: 02/23/2021 CLINICAL DATA:  Right upper extremity edema EXAM: RIGHT UPPER EXTREMITY VENOUS DOPPLER ULTRASOUND TECHNIQUE: Gray-scale sonography with graded compression, as well as color Doppler and duplex ultrasound were performed to  evaluate the upper extremity deep venous system from the level of the subclavian vein and including the jugular, axillary, basilic, radial, ulnar and upper cephalic vein. Spectral Doppler was utilized to evaluate flow at rest and with distal augmentation maneuvers. COMPARISON:  None. FINDINGS: Contralateral Subclavian Vein: Respiratory phasicity is normal and symmetric with the symptomatic side. No evidence of thrombus. Normal compressibility. Internal Jugular Vein: No evidence of thrombus. Normal compressibility, respiratory phasicity and response to augmentation. Subclavian Vein: No evidence of thrombus. Normal compressibility, respiratory phasicity and response to augmentation. Axillary Vein: No evidence of thrombus. Normal compressibility, respiratory phasicity and response to augmentation. Cephalic Vein: No evidence of thrombus. Normal compressibility, respiratory phasicity and response to augmentation. Basilic Vein: No evidence of thrombus. Normal compressibility, respiratory phasicity and response to augmentation. Brachial Veins: No evidence of thrombus. Normal compressibility, respiratory phasicity and response to augmentation. Radial Veins: No evidence of thrombus. Normal compressibility, respiratory phasicity and response to augmentation. Ulnar Veins: No evidence of thrombus. Normal compressibility, respiratory phasicity and response to augmentation. IMPRESSION: No evidence of DVT within the right upper extremity. Electronically Signed   By: Jerilynn Mages.  Shick M.D.   On: 02/23/2021 08:46     Medications   Scheduled Meds: . amoxicillin-clavulanate  1 tablet Oral Q12H  . aspirin EC  81 mg Oral Daily  . bisacodyl  5 mg Oral BID  . enoxaparin (LOVENOX) injection  40 mg Subcutaneous Q24H  . folic acid  1 mg Oral Daily  . gabapentin  200 mg Oral TID  . multivitamin with minerals  1 tablet Oral Daily  . nicotine  21 mg Transdermal Daily  . pantoprazole  40  mg Oral q1600  . polyethylene glycol  17 g Oral  BID  . potassium chloride  40 mEq Oral Q4H  . sertraline  25 mg Oral Daily   Continuous Infusions: . sodium chloride Stopped (02/23/21 0037)  . thiamine injection Stopped (02/23/21 0026)       LOS: 4 days    Time spent: 30 minutes with >50% spent at bedside and in coordination of care.    Ezekiel Slocumb, DO [facility]  02/23/2021, 6:12 PM      If 7PM-7AM, please contact night-coverage. How to contact the [facility] Attending or Consulting provider [place] or covering provider during after hours [place], for this patient?    1. Check the care team in [facility] and look for a) attending/consulting [facility] provider listed and b) the [facility] team listed 2. Log into [email or web address] and use [place]'s universal password to access. If you do not have the password, please contact the hospital operator. 3. Locate the [facility] provider you are looking for under [facility] and page to a number that you can be directly reached. 4. If you still have difficulty reaching the provider, please page the [facility] (Director on Call) for the Hospitalists listed on amion for assistance.

## 2021-02-23 NOTE — Progress Notes (Signed)
Daily Progress Note   Patient Name: Stephen [name]       Date: 02/23/2021 DOB: 04-28-1946  Age: 75 y.o. MRN#: [id number] Attending Physician: Ezekiel Slocumb, DO Primary Care Physician: [name], MD Admit Date: 02/19/2021  Reason for Consultation/Follow-up: Establishing goals of care  Subjective: No acute distress.  Patient awake and alert and oriented x3.  Denies pain or shortness of breath.  Is asking when he is going to be able to return home with his wife.  Wife is at the bedside.  Updates provided.  Patient's oral nutrition continues to be minimal.  Breakfast tray at the bedside which appears to only have been concerned approximately 10%.  Offered assistance to patient to allow for further nutrition however he declines by myself and wife.  Patient states he is not eating or drinking anything that is thick and not "natural food".  Also stating food does not have any flavor or taste and is not what he is accustomed to.  He states he is ready to return home so he can eat and drink what he wants to.  Discussed at length with patient and wife regarding risk of aspiration and complications.  Wife verbalizes understanding expressing patient has shown signs of aspiration for several years now however he is able to continue to tolerate his foods as they are not as concerned.  Shared trajectory of continued aspiration expressing as time goes on patient's health will continue to decline/worsen related directly to aspiration.  Education provided on comfort feeds in the setting of DNR and focus on comfort.  Patient states he is in agreement with what ever his wife chooses.  Mrs. Daleo states she is not interested in changing patient's CODE STATUS and becomes tearful kissing on patient stating she is not ready to lose him.  I explained at length what DNR means with emphasis this does not mean patient is actively dying.  She and patient both verbalized understanding again confirming wishes to  remain a full code.  Patient does acknowledge he would not want any type of feeding tubes and wishes to eat whatever he likes confirming plans to do so once he is discharged.  Wife verbalizes appreciation of discussion also acknowledging they are not interested in SNF placement as she feels she can provide the care he needs in the home with continued home health support.  She expresses concerns of patient not receiving the appropriate attention and care warranting her fears of allowing him to discharge to a facility.  She understands his weakness and overall health and again confirms wishes to provide the care he needs in the home.  Education provided on outpatient palliative support.  Recommendations provided for supports and services at discharge.  Written information provided to wife.  She verbalized she will review and discuss further with patient's primary care doctor and if interested they would then initiate care after discharge.  All questions answered and support provided.  Length of Stay: 4 days  Vital Signs: BP 120/70 (BP Location: Right Arm)   Pulse 91   Temp 99.1 F (37.3 C) (Axillary)   Resp 20   Ht 5\' 7"  (1.702 m)   Wt 72.2 kg   SpO2 91%   BMI 24.93 kg/m  SpO2: SpO2: 91 % O2 Device: O2 Device: Nasal Cannula O2 Flow Rate: O2 Flow Rate (L/min): 2 L/min  Physical Exam: NAD, chronically ill-appearing RRR Diminished bilaterally AAOx3, mood appropriate  Palliative Care Assessment & Plan   Goals of Care/Recommendations: Continue to treat the treatable, wife remains hopeful for stability/improvement.  Detailed discussion and education provided regarding poor long-term prognosis, high risk of continued aspiration, and recommendations for diet consistency to assist in minimizing aspiration risk.  Wife and patient verbalizes understanding however patient states he is going to eat what ever he feels and is not interested in nectar thick or other consistencies outside  of the natural state of what he is eating or drinking.  They understand the risk of continued aspiration and education was provided on comfort feedings in the setting of patient wanting to eat as he wished.  Patient and wife has declined to transition care to focus on comfort also request to remain full code. Outpatient palliative recommended for ongoing discussions and support.  Wife plans to further discuss with outpatient medical team and will initiate services as they feel necessary.  Prognosis: Poor  Discharge Planning: To Be Determined  Thank you for allowing the Palliative Medicine Team to assist in the care of this patient.  Time Total: 45 min.   Visit consisted of counseling and education dealing with the complex and emotionally intense issues of symptom management and palliative care in the setting of serious and potentially life-threatening illness.Greater than 50%  of this time was spent counseling and coordinating care related to the above assessment and plan.  Alda Lea, AGPCNP-BC  Palliative Medicine Team [phone]

## 2021-02-23 NOTE — Progress Notes (Addendum)
SATURATION QUALIFICATIONS: (This note is used to comply with regulatory documentation for home oxygen)  Patient Saturations on Room Air at Rest = 92%  Patient Saturations on Room Air with exertion = 84%  Patient Saturations on 3 Liters of oxygen with exertion = 92%  Please briefly explain why patient needs home oxygen:

## 2021-02-23 NOTE — TOC Progression Note (Signed)
Transition of Care [facility]) - Progression Note    Patient Details  Name: Stephen [name] MRN: [id number] Date of Birth: [birth year]  Transition of Care [facility]) CM/SW Contact  Beverly Sessions, RN Phone Number: 02/23/2021, 2:45 PM  Clinical Narrative:     Bed offers presented to patient and wife at bedside. Declined SNF placement at discharge Wishes to return home with home health services.  Patient active with [facility].  [place] with [facility] notified of admission  Patient currently requiring acute O2 Wife states that patient has a transfer chair at home that they pay $10 a month for, however it is to small.  TOC checked with [employer].  Since patient received transport chair last year he would not be eligible for a WC or a new transport chair.  Would be out of pocket expense.  Wife updated  MD and bedside RN notified that patient and wife declined SNF Expected Discharge Plan: Skilled Nursing Facility Barriers to Discharge: Continued Medical Work up  Expected Discharge Plan and Services Expected Discharge Plan: [place]   Discharge Planning Services: CM Consult   Living arrangements for the past 2 months: Single Family Home                                       Social Determinants of Health (SDOH) Interventions    Readmission Risk Interventions No flowsheet data found.

## 2021-02-23 NOTE — Plan of Care (Signed)
  Problem: Nutrition: Goal: Adequate nutrition will be maintained Outcome: Progressing   Problem: Elimination: Goal: Will not experience complications related to bowel motility Outcome: Progressing   Problem: Safety: Goal: Ability to remain free from injury will improve Outcome: Progressing   Problem: Skin Integrity: Goal: Risk for impaired skin integrity will decrease Outcome: Progressing   Problem: Clinical Measurements: Goal: Ability to maintain a body temperature in the normal range will improve Outcome: Progressing

## 2021-02-24 LAB — CULTURE, BLOOD (ROUTINE X 2)
Culture: NO GROWTH
Culture: NO GROWTH
Special Requests: ADEQUATE
Special Requests: ADEQUATE

## 2021-02-24 LAB — BASIC METABOLIC PANEL
Anion gap: 10 (ref 5–15)
BUN: 17 mg/dL (ref 8–23)
CO2: 25 mmol/L (ref 22–32)
Calcium: 8.8 mg/dL — ABNORMAL LOW (ref 8.9–10.3)
Chloride: 106 mmol/L (ref 98–111)
Creatinine, Ser: 0.6 mg/dL — ABNORMAL LOW (ref 0.61–1.24)
GFR, Estimated: >60 mL/min (ref >60)
Glucose, Bld: 101 mg/dL — ABNORMAL HIGH (ref 70–99)
Potassium: 2.9 mmol/L — ABNORMAL LOW (ref 3.5–5.1)
Sodium: 141 mmol/L (ref 135–145)

## 2021-02-24 LAB — RESP PANEL BY RT-PCR (FLU A&B, COVID) ARPGX2
Influenza A by PCR: NEGATIVE
Influenza B by PCR: NEGATIVE
SARS Coronavirus 2 by RT PCR: NEGATIVE

## 2021-02-24 LAB — CBC
HCT: 32.8 % — ABNORMAL LOW (ref 39.0–52.0)
Hemoglobin: 11.5 g/dL — ABNORMAL LOW (ref 13.0–17.0)
MCH: 35.7 pg — ABNORMAL HIGH (ref 26.0–34.0)
MCHC: 35.1 g/dL (ref 30.0–36.0)
MCV: 101.9 fL — ABNORMAL HIGH (ref 80.0–100.0)
Platelets: 225 10*3/uL (ref 150–400)
RBC: 3.22 MIL/uL — ABNORMAL LOW (ref 4.22–5.81)
RDW: 13.4 % (ref 11.5–15.5)
WBC: 12.8 10*3/uL — ABNORMAL HIGH (ref 4.0–10.5)
nRBC: 0 % (ref 0.0–0.2)

## 2021-02-24 LAB — POTASSIUM: Potassium: 4.3 mmol/L (ref 3.5–5.1)

## 2021-02-24 MED ORDER — NICOTINE 21 MG/24HR TD PT24: 21.0000 mg | MEDICATED_PATCH | Freq: Every day | TRANSDERMAL | 0 refills | Status: DC

## 2021-02-24 MED ORDER — GABAPENTIN 100 MG PO CAPS: 200.0000 mg | ORAL_CAPSULE | Freq: Three times a day (TID) | ORAL | Status: DC

## 2021-02-24 MED ORDER — ALBUTEROL SULFATE (2.5 MG/3ML) 0.083% IN NEBU: 2.5000 mg | INHALATION_SOLUTION | RESPIRATORY_TRACT | 12 refills | Status: DC | PRN

## 2021-02-24 MED ORDER — FOLIC ACID 1 MG PO TABS: 1.0000 mg | ORAL_TABLET | Freq: Every day | ORAL | 1 refills | Status: AC

## 2021-02-24 MED ORDER — AMOXICILLIN-POT CLAVULANATE 875-125 MG PO TABS: 1.0000 | ORAL_TABLET | Freq: Two times a day (BID) | ORAL | 0 refills | Status: AC

## 2021-02-24 MED ORDER — ADULT MULTIVITAMIN W/MINERALS CH: 1.0000 | ORAL_TABLET | Freq: Every day | ORAL | Status: DC

## 2021-02-24 MED ORDER — VITAMIN B-1 250 MG PO TABS: 250.0000 mg | ORAL_TABLET | Freq: Every day | ORAL | Status: DC

## 2021-02-24 MED ORDER — FOLIC ACID 1 MG PO TABS: 1.0000 mg | ORAL_TABLET | Freq: Every day | ORAL | Status: DC

## 2021-02-24 MED ORDER — POTASSIUM CHLORIDE 10 MEQ/100ML IV SOLN
10.0000 meq | INTRAVENOUS | Status: AC
  Administered 2021-02-24 (×4): 10 meq via INTRAVENOUS
  Filled 2021-02-24 (×4): qty 100

## 2021-02-24 MED ORDER — POTASSIUM CHLORIDE 20 MEQ PO PACK
40.0000 meq | PACK | ORAL | Status: AC
  Administered 2021-02-24 (×2): 40 meq via ORAL
  Filled 2021-02-24 (×2): qty 2

## 2021-02-24 MED ORDER — GABAPENTIN 100 MG PO CAPS: 200.0000 mg | ORAL_CAPSULE | Freq: Two times a day (BID) | ORAL | 1 refills | Status: DC

## 2021-02-24 MED ORDER — POLYETHYLENE GLYCOL 3350 17 G PO PACK: 17.0000 g | PACK | Freq: Every day | ORAL | 0 refills | Status: DC

## 2021-02-24 MED ORDER — VITAMIN B-1 250 MG PO TABS: 250.0000 mg | ORAL_TABLET | Freq: Every day | ORAL | 1 refills | Status: DC

## 2021-02-24 MED ORDER — POLYETHYLENE GLYCOL 3350 17 G PO PACK: 17.0000 g | PACK | Freq: Two times a day (BID) | ORAL | 0 refills | Status: DC

## 2021-02-24 MED ORDER — NICOTINE 14 MG/24HR TD PT24: 14.0000 mg | MEDICATED_PATCH | Freq: Every day | TRANSDERMAL | 0 refills | Status: AC

## 2021-02-24 NOTE — Progress Notes (Signed)
PROGRESS NOTE    Dorell Gatlin   ZOX:[id number]  DOB: 05/12/1946  PCP: [name], MD    DOA: 02/19/2021 LOS: 5   Brief Narrative   Stephen [name] is a 75 y.o. 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  He was found to have a partial small bowel obstruction on CT abdomen/pelvis without a focal transition point.  Labs showed marked leukocytosis 21.6k, lactic acidosis 3.1, negative for Covid-19.  UA was consistent with infection.  Chest xray concerning for PNA with Right basilar opacity.  Admitted to hospitalist service.  General surgery consulted.     Assessment & Plan    Alcohol use disorder with Acute alcohol withdrawal syndrome 4/9 afternoon-early evening pt febrile, tachycardic, tachypneic, hallucinating, high CIWA scores. Briefly required Precedex. Wife reports at least six liquor/whiskey drink daily, thinks more recently.   Patient was started on CIWA protocol.  His mental status started improving.  He was weaned off of Librium.  Gabapentin continued.  Concern also for Wernicke's encephalopathy and so the patient was given high-dose IV thiamine.  Dysphagia  Seen by speech therapy.  On full liquid diet with nectar thick liquids.  Aspiration precautions.  Pt is very high risk for aspiration and subsequent respiratory compromise.  Pt and wife educated on this extensively, but want pt to have regular liquids and stated williningness to accept the risk of aspiration.  Generlized Weakness / Physical Debility  SNF recommended by therapy.  Family has been going back and forth.  Initially declined SNF.  Today I was informed that the patient is normally to go ahead  with a skilled nursing facility for short-term rehab.  Case manager/social worker is following   Partial SBO  Resolved, return of bowel function after Miralax. General surgery consulted, signed off. NG tube was not indicated, nor surgical intervention.  Severe sepsis due to UTI and pneumonia Sepsis present on admission with leukocytosis, tachycardia, tachypnea.  Lactic acidosis consistent with severe sepsis.  Treated per sepsis protocol in the ED.  Hemodynamically stable. Initially on Zosyn.  Subsequently changed over to Augmentin.  Sepsis physiology has resolved.  UTI  UA with large leukocytes, rare bacteria, >50 WBCs. Urine culture grew Enterococcus faecalis. --Abs: Zosyn >> Augmentin (4/11) --Urine culture growing Enterococcus faecalis sensitive to ampicillin  Aspiration pneumonia Chest x-ray showed hazy pulmonary opacity with asymmetric right-sided involvement more concerning for pneumonia or aspiration. Evaluated by SLP, deemed mild aspiration risk, regular diet consistency recommended with general aspiration and reflux precautions. Negative for COVID-19 and influenza A and B. --Abx: Zosyn >> Augmentin (4/11) --Supportive care: Mucinex, albuterol, incentive spirometer, flutter valve  Acute respiratory failure with hypoxia  Patient presented with tachypnea, x-ray findings concerning for pneumonia, O2 saturation 88% on room air.  Requiring 2 to 3 L of oxygen here in the hospital.  Desaturation noted with on room air.  Will need oxygen at discharge.    Likely OSA CPAP or BiPAP overnight  Hypokalemia  Magnesium noted to be 1.9.  Patient apparently did not consume his potassium yesterday due to his swallowing difficulty.  Intravenous supplementation ordered today.  We will recheck labs this afternoon.   Essential hypertension  May resume Benicar at discharge.    Tobacco abuse  Nicotine patch ordered.    Stage IV rectal cancer  Status post resection, with colostomy.   Reportedly finished chemotherapy in 2016. Currently no acute issues. --Palliative care consulted for goals of care discussion.  They have not seen the patient yet.  Can be pursued at the skilled nursing facility.  GERD -continue Protonix  Depression -continue Zoloft  Lumbar compression fracture -seen on CT abdomen pelvis, L4 and L5 compression fractures new when compared To imaging from June 2021.  These have occurred in the interim.  No acute fractures seen. Mostly asymptomatic. --Continue home Tylenol and as needed Percocet    Patient BMI: Body mass index is 24.93 kg/m.   DVT prophylaxis: enoxaparin (LOVENOX) injection 40 mg Start: 02/23/21 2200   Diet:  Diet Orders (From admission, onward)    Start     Ordered   02/22/21 1326  Diet full liquid Room service appropriate? Yes with Assist; Fluid consistency: Nectar Thick  Diet effective now       Comments: NO STRAWS!  Extra Cup for thickened milk.  Question Answer Comment  Room service appropriate? Yes with Assist   Fluid consistency: Nectar Thick      02/22/21 1326            Code Status: Full Code    Subjective    Patient's wife is at the bedside.  Patient denies any complaints.  Does not appear to be in any discomfort.     Disposition Plan & Communication   Status is: Inpatient  Inpatient status remains appropriate due to severity of illness: ongoing encephalopathy, dysphagia, ongoing evaluation  Dispo: The patient is from: Home              Anticipated d/c is to: Home w [facility]               Patient currently not medically stable for discharge   Difficult to place patient no  Family communication: wife at bedside on rounds   Consults, Procedures, Significant Events   Consultants:   General surgery  Procedures:   None  Antimicrobials:  Anti-infectives (From admission, onward)   Start     Dose/Rate Route Frequency Ordered Stop   02/22/21 1000  amoxicillin-clavulanate (AUGMENTIN) 875-125 MG per tablet 1  tablet        1 tablet Oral Every 12 hours 02/22/21 0904 02/26/21 0959   02/19/21 1400  piperacillin-tazobactam (ZOSYN) IVPB 3.375 g  Status:  Discontinued        3.375 g 12.5 mL/hr over 240 Minutes Intravenous Every 8 hours 02/19/21 0728 02/22/21 0904   02/19/21 0600  piperacillin-tazobactam (ZOSYN) IVPB 3.375 g        3.375 g 100 mL/hr over 30 Minutes Intravenous  Once 02/19/21 0545 02/19/21 0728        Micro    Objective   Vitals:   02/23/21 2351 02/24/21 0404 02/24/21 0748 02/24/21 1207  BP: 132/71 140/75 139/81 135/78  Pulse: 88 84 88 91  Resp: (!) 24 18 20 20   Temp: 98.6 F (37 C) 98.3 F (36.8 C) (!) 97.3 F (36.3 C) 97.9 F (36.6 C)  TempSrc: Oral Oral Oral Oral  SpO2: 94% 94% (!) 88% 97%  Weight:      Height:        Intake/Output Summary (Last 24 hours) at 02/24/2021 1245 Last data filed at 02/24/2021 1016 Gross per 24 hour  Intake 0 ml  Output 250 ml  Net -250 ml   [employer]  02/21/21 1834 02/22/21 0650 02/22/21 0900  Weight: 72.9 kg 72.5 kg 72.2 kg    Physical Exam:  General appearance: Awake alert.  In no distress Resp: Normal effort at rest.  Crackles bilateral bases right more than left.  No wheezing or rhonchi. Cardio: S1-S2 is normal regular.  No S3-S4.  No rubs murmurs or bruit GI: Abdomen is soft.  Nontender nondistended.  Bowel sounds are present normal.  No masses organomegaly Extremities: No edema.  Full range of motion of lower extremities. Neurologic: No focal neurological deficits.     Labs   Data Reviewed: I have personally reviewed following labs and imaging studies  CBC: Recent Labs  Lab 02/19/21 0506 02/20/21 0412 02/21/21 0518 02/22/21 0519 02/23/21 0507 02/24/21 0437  WBC 21.6* 20.0* 17.5* 15.8* 14.6* 12.8*  NEUTROABS 20.0*  --  15.4* 13.4* 12.0*  --   HGB 15.4 11.6* 13.7 12.6* 11.6* 11.5*  HCT 44.0 33.2* 39.9 34.9* 33.5* 32.8*  MCV 100.5* 104.1* 104.5* 101.5* 102.4* 101.9*  PLT 309 170 215 222 231 814   Basic  Metabolic Panel: Recent Labs  Lab 02/20/21 0412 02/21/21 0518 02/22/21 0519 02/23/21 0507 02/24/21 0437  NA 135 136 138 138 141  K 3.3* 4.2 2.9* 2.9* 2.9*  CL 104 103 104 105 106  CO2 22 21* 22 23 25   GLUCOSE 81 92 90 91 101*  BUN 15 16 18 19 17   CREATININE 0.75 0.84 0.70 0.58* 0.60*  CALCIUM 8.5* 8.9 8.7* 8.9 8.8*  MG  --  1.7 1.7 1.9  --   PHOS  --  2.8  --   --   --    GFR: Estimated Creatinine Clearance: 75.7 mL/min (A) (by C-G formula based on SCr of 0.6 mg/dL (L)). Liver Function Tests: Recent Labs  Lab 02/19/21 0506  AST 56*  ALT 21  ALKPHOS 96  BILITOT 1.4*  PROT 7.8  ALBUMIN 3.4*   Recent Labs  Lab 02/19/21 0506  LIPASE 29   CBG: Recent Labs  Lab 02/20/21 1803  GLUCAP 125*   Sepsis Labs: Recent Labs  Lab 02/19/21 0509 02/19/21 0730 02/19/21 1202 02/21/21 0518 02/22/21 0519  PROCALCITON  --   --  1.50 1.26 0.54  LATICACIDVEN 3.1* 1.7  --   --   --     Recent Results (from the past 240 hour(s))  Resp Panel by RT-PCR (Flu A&B, Covid) Nasopharyngeal Swab     Status: None   Collection Time: 02/19/21  5:06 AM   Specimen: Nasopharyngeal Swab; Nasopharyngeal(NP) swabs in vial transport medium  Result Value Ref Range Status   SARS Coronavirus 2 by RT PCR NEGATIVE NEGATIVE Final    Comment: (NOTE) SARS-CoV-2 target nucleic acids are NOT DETECTED.  The SARS-CoV-2 RNA is generally detectable in upper respiratory specimens during the acute phase of infection. The lowest concentration of SARS-CoV-2 viral copies this assay can detect is 138 copies/mL. A negative result does not preclude SARS-Cov-2 infection and should not be used as the sole basis for treatment or other patient management decisions. A negative result may occur with  improper specimen collection/handling, submission of specimen other than nasopharyngeal swab, presence of viral mutation(s) within the areas targeted by this assay, and inadequate number of viral copies(<138 copies/mL). A  negative result must be combined with clinical observations, patient history, and epidemiological information. The expected result is Negative.  Fact Sheet for Patients:  EntrepreneurPulse.com.au  Fact Sheet for Healthcare Providers:  IncredibleEmployment.be  This test is no t yet  approved or cleared by the [place] and  has been authorized for detection and/or diagnosis of SARS-CoV-2 by FDA under an Emergency Use Authorization (EUA). This EUA will remain  in effect (meaning this test can be used) for the duration of the COVID-19 declaration under Section 564(b)(1) of the Act, 21 U.S.C.section 360bbb-3(b)(1), unless the authorization is terminated  or revoked sooner.       Influenza A by PCR NEGATIVE NEGATIVE Final   Influenza B by PCR NEGATIVE NEGATIVE Final    Comment: (NOTE) The Xpert Xpress SARS-CoV-2/FLU/RSV plus assay is intended as an aid in the diagnosis of influenza from Nasopharyngeal swab specimens and should not be used as a sole basis for treatment. Nasal washings and aspirates are unacceptable for Xpert Xpress SARS-CoV-2/FLU/RSV testing.  Fact Sheet for Patients: EntrepreneurPulse.com.au  Fact Sheet for Healthcare Providers: IncredibleEmployment.be  This test is not yet approved or cleared by the [place] FDA and has been authorized for detection and/or diagnosis of SARS-CoV-2 by FDA under an Emergency Use Authorization (EUA). This EUA will remain in effect (meaning this test can be used) for the duration of the COVID-19 declaration under Section 564(b)(1) of the Act, 21 U.S.C. section 360bbb-3(b)(1), unless the authorization is terminated or revoked.  Performed at [facility], [address], [place], [place] [address]   Urine Culture     Status: Abnormal   Collection Time: 02/19/21  5:06 AM   Specimen: Urine, Random  Result Value Ref Range Status   Specimen  Description   Final    URINE, RANDOM Performed at [facility], [address] [place], [place] [address]    Special Requests   Final    NONE Performed at [facility], [address] [place], [place] [address]    Culture >=100,000 COLONIES/mL ENTEROCOCCUS FAECALIS (A)  Final   Report Status 02/21/2021 FINAL  Final   Organism ID, Bacteria ENTEROCOCCUS FAECALIS (A)  Final      Susceptibility   Enterococcus faecalis - MIC*    AMPICILLIN <=2 SENSITIVE Sensitive     NITROFURANTOIN <=16 SENSITIVE Sensitive     VANCOMYCIN 1 SENSITIVE Sensitive     * >=100,000 COLONIES/mL ENTEROCOCCUS FAECALIS  Culture, blood (routine x 2)     Status: None   Collection Time: 02/19/21  5:12 AM   Specimen: BLOOD  Result Value Ref Range Status   Specimen Description BLOOD BLOOD RIGHT FOREARM  Final   Special Requests   Final    BOTTLES DRAWN AEROBIC AND ANAEROBIC Blood Culture adequate volume   Culture   Final    NO GROWTH 5 DAYS Performed at [facility], [address] [place], [place] [address]    Report Status 02/24/2021 FINAL  Final  Culture, blood (routine x 2)     Status: None   Collection Time: 02/19/21  5:12 AM   Specimen: BLOOD  Result Value Ref Range Status   Specimen Description BLOOD LEFT ANTECUBITAL  Final   Special Requests   Final    BOTTLES DRAWN AEROBIC AND ANAEROBIC Blood Culture adequate volume   Culture   Final    NO GROWTH 5 DAYS Performed at [facility], [address], [place],  [address]    Report Status 02/24/2021 FINAL  Final  MRSA PCR Screening     Status: None   Collection Time: 02/20/21  6:09 PM   Specimen: Nasopharyngeal  Result Value Ref Range Status   MRSA by PCR NEGATIVE NEGATIVE Final  Comment:        The GeneXpert MRSA Assay (FDA approved for NASAL specimens only), is one component of a comprehensive MRSA colonization surveillance program. It is not intended to diagnose MRSA infection nor to guide  or monitor treatment for MRSA infections. Performed at [facility], [address] [place], [place] [address]       Imaging Studies   US Venous Img Upper Uni Right(DVT)  Result Date: 02/23/2021 CLINICAL DATA:  Right upper extremity edema EXAM: RIGHT UPPER EXTREMITY VENOUS DOPPLER ULTRASOUND TECHNIQUE: Gray-scale sonography with graded compression, as well as color Doppler and duplex ultrasound were performed to evaluate the upper extremity deep venous system from the level of the subclavian vein and including the jugular, axillary, basilic, radial, ulnar and upper cephalic vein. Spectral Doppler was utilized to evaluate flow at rest and with distal augmentation maneuvers. COMPARISON:  None. FINDINGS: Contralateral Subclavian Vein: Respiratory phasicity is normal and symmetric with the symptomatic side. No evidence of thrombus. Normal compressibility. Internal Jugular Vein: No evidence of thrombus. Normal compressibility, respiratory phasicity and response to augmentation. Subclavian Vein: No evidence of thrombus. Normal compressibility, respiratory phasicity and response to augmentation. Axillary Vein: No evidence of thrombus. Normal compressibility, respiratory phasicity and response to augmentation. Cephalic Vein: No evidence of thrombus. Normal compressibility, respiratory phasicity and response to augmentation. Basilic Vein: No evidence of thrombus. Normal compressibility, respiratory phasicity and response to augmentation. Brachial Veins: No evidence of thrombus. Normal compressibility, respiratory phasicity and response to augmentation. Radial Veins: No evidence of thrombus. Normal compressibility, respiratory phasicity and response to augmentation. Ulnar Veins: No evidence of thrombus. Normal compressibility, respiratory phasicity and response to augmentation. IMPRESSION: No evidence of DVT within the right upper extremity. Electronically Signed   By: Jerilynn Mages.  Shick M.D.   On: 02/23/2021  08:46     Medications   Scheduled Meds: . amoxicillin-clavulanate  1 tablet Oral Q12H  . aspirin EC  81 mg Oral Daily  . bisacodyl  5 mg Oral BID  . enoxaparin (LOVENOX) injection  40 mg Subcutaneous Q24H  . folic acid  1 mg Oral Daily  . gabapentin  200 mg Oral TID  . multivitamin with minerals  1 tablet Oral Daily  . nicotine  21 mg Transdermal Daily  . pantoprazole  40 mg Oral q1600  . polyethylene glycol  17 g Oral BID  . sertraline  25 mg Oral Daily   Continuous Infusions: . sodium chloride Stopped (02/23/21 0037)  . potassium chloride 10 mEq (02/24/21 1136)  . thiamine injection 500 mg (02/23/21 2158)       LOS: 5 days    Bonnielee Haff, [facility]  02/24/2021, 12:45 PM    If 7PM-7AM, please contact night-coverage. How to contact the [facility] Attending or Consulting provider [place] or covering provider during after hours [place], for this patient?    1. Check the care team in [facility] and look for a) attending/consulting TR[facility] provider listed and b) the [facility] team listed 2. Log into [email or web address] and use [place]'s universal password to access. If you do not have the password, please contact the hospital operator. 3. Locate the [facility] provider you are looking for under [facility] and page to a number that you can be directly reached. 4. If you still have difficulty reaching the provider, please page the [facility] (Director on Call) for the Hospitalists listed on amion for assistance.

## 2021-02-24 NOTE — Discharge Instructions (Signed)
Aspiration Pneumonia, Adult  Aspiration pneumonia is an infection that occurs after lung (pulmonary) aspiration. Pulmonary aspiration is when you inhale a large amount of food, liquid, stomach acid, or saliva into the lungs. This can cause inflammation and infection in the lungs. This can make you cough and make it hard to breathe. Aspiration pneumonia is a serious condition and can be life-threatening. What are the causes? This condition may be caused by:  Bacteria in food, liquid, stomach acid, or saliva that is inhaled into the lung.  Irritation and inflammation that results from material, such as blood or a foreign body, being inhaled into the lung. This can lead to an infection even though the material is not originally contaminated with bacteria. What increases the risk? You are more likely to get aspiration pneumonia if you have a condition that makes it hard to breathe, swallow, cough, or gag. These conditions may include:  A breathing disorder, such as chronic obstructive pulmonary disease, that makes it hard to eat or drink while breathing.  A brain (neurologic) disorder, such as stroke, seizures, Parkinson's disease, dementia, amyotrophic lateral sclerosis (ALS), or brain injury.  Having gastroesophageal reflux disease (GERD).  Having a weak disease-fighting system (immune system).  Having a narrowing of the tube that carries food to the stomach (esophageal narrowing). Other factors that may make you more likely to get aspiration pneumonia include:  Being older than age 60 and frail.  Being given a general anesthetic for procedures.  Drinking too much alcohol and passing out. If you pass out and vomit, then vomit can be inhaled into your lungs.  Taking certain medicines, such as tranquilizers or sedatives.  Taking poor care of your mouth and teeth.  Being malnourished. What are the signs or symptoms? The main symptom of pulmonary aspiration may be an episode of choking  or coughing while eating or drinking. When aspiration pneumonia develops, symptoms include:  Persistent cough.  Difficulty breathing, such as wheezing or shortness of breath.  Fever.  Chest pain.  Being more tired than usual (fatigue). Pulmonary aspiration may be silent, meaning that it is not associated with coughing or choking while eating or drinking. How is this diagnosed? This condition may be diagnosed based on:  A physical exam.  Tests, such as: ? A chest X-ray. ? A sputum culture. Saliva and mucus (sputum) are collected from the lungs or the tubes that carry air to the lungs (bronchi). The sputum is then tested for bacteria. ? Oximetry. A sensor or clip is placed on areas such as a finger, earlobe, or toe to measure the oxygen level in your blood. ? Blood tests. ? A swallowing study. This test looks at how food is swallowed and whether it goes into your windpipe (trachea) or esophagus. ? A bronchoscopy. This test uses a flexible tube (bronchoscope) to see inside the lungs. How is this treated? This condition may be treated with:  Medicines. Antibiotic medicine will be given to kill the pneumonia bacteria. Other medicines may also be used to reduce fever, pain, or inflammation.  Breathing assistance and oxygen therapy. Depending on how well you are breathing, you may need to be given oxygen, or you may need breathing support from a breathing machine (ventilator).  Thoracentesis. This is a procedure to remove fluid that has built up in the space between the linings of the chest wall and the lungs.  Dietary changes. You may need to avoid certain food textures or liquids. For people who have recurrent aspiration pneumonia,   a feeding tube might be placed in the stomach for nutrition. Follow these instructions at home: Medicines  Take over-the-counter and prescription medicines only as told by your health care provider.  If you were prescribed an antibiotic medicine, take  it as told by your health care provider. Do not stop taking the antibiotic even if you start to feel better.  Take cough medicine only if you are losing sleep. Cough medicine can prevent your body's natural ability to remove mucus from your lungs.   General instructions  Carefully follow any eating instructions you were given, such as avoiding certain food textures or thickening your liquids. Thickening liquids reduces the risk of developing aspiration pneumonia again.  Return to normal activities as told by your health care provider. Ask your health care provider what activities are safe for you.  Sleep in a semi-upright position at night. Try to sleep in a reclining chair, or place a few pillows under your head in bed.  Do not use any products that contain nicotine or tobacco, such as cigarettes, e-cigarettes, and chewing tobacco. If you need help quitting, ask your health care provider.  Keep all follow-up visits as told by your health care provider. This is important.   Contact a health care provider if you:  Have a fever.  Are coughing or choking while eating or drinking.  Continue to have signs or symptoms of aspiration pneumonia. Get help right away if you have:  Worsening shortness of breath, wheezing, or difficulty breathing.  Chest pain. Summary  Aspiration pneumonia is an infection that occurs after lung (pulmonary) aspiration. Pulmonary aspiration is when you inhale a large amount of food, liquid, stomach acid, or saliva into the lungs.  The main symptom of pulmonary aspiration may be an episode of choking or coughing. It may also be silent without coughing or choking.  You are more likely to get aspiration pneumonia if you have a condition that makes it hard to breathe, swallow, cough, or gag. This information is not intended to replace advice given to you by your health care provider. Make sure you discuss any questions you have with your health care provider. Document  Revised: 11/01/2019 Document Reviewed: 11/01/2019 Elsevier Patient Education  2021 [employer]vier Inc.  

## 2021-02-24 NOTE — Progress Notes (Signed)
Physical Therapy Treatment Patient Details Name: Stephen [name] MRN: [id number] DOB: Sep 04, 1946 Today's Date: 02/24/2021    History of Present Illness Stephen [name] is a 75 y.o. 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    PT Comments    PT/OT co-treat 2/2 to needing +2 assistance and pt's poor activity tolerance. Pt was long sitting in bed upon arriving. Supportive spouse at bedside. He required +2 assistance for all mobility. Attempted standing with +2 assist however poor pt participation and safety. PT/OT had lengthy discussion with Pt/spouse about DC. They are unwilling to go to SNF due to being too far from home. If pt is [place] home, highly recommend EMS transport and mechanical lift. He was supine in bed with bed alarm in place and spouse at bedside. CM/RN aware of pt's abilities and concerns. If pt goes home, HHPT is needed.     Follow Up Recommendations  SNF;Supervision/Assistance - 24 hour;Supervision for mobility/OOB;Other (comment) (pt unwilling to go to rehab currently. If DC home, will need mechanical lift and EMS transport home.)     Equipment Recommendations  Other (comment) (hoyer lift)       Precautions / Restrictions Precautions Precautions: Fall Restrictions Weight Bearing Restrictions: No    Mobility  Bed Mobility Overal bed mobility: Needs Assistance Bed Mobility: Rolling Rolling: Total assist;+2 for physical assistance;+2 for safety/equipment Sidelying to sit: Total assist;+2 for physical assistance;+2 for safety/equipment Supine to sit: Total assist;+2 for physical assistance;+2 for safety/equipment Sit to supine: Total assist;+2 for safety/equipment        Transfers Overall transfer level: Needs assistance Equipment  used: 2 person hand held assist Transfers: Sit to/from Stand Sit to Stand: Total assist;+2 physical assistance;+2 safety/equipment;From elevated surface         General transfer comment: pt unable to stand/transfer due to sever posterior lean. Pt will require mechanical lift for home use.  Ambulation/Gait    General Gait Details: unable/unsafe       Balance Overall balance assessment: Needs assistance Sitting-balance support: Feet supported;Bilateral upper extremity supported Sitting balance-Leahy Scale: Zero Sitting balance - Comments: severe posterior push throughout sitting. needs constant assistance to prevent posterior fall      Cognition Arousal/Alertness: Awake/alert Behavior During Therapy: Agitated;Anxious Overall Cognitive Status: History of cognitive impairments - at baseline      General Comments: Increased time to process. Pt is demanding going home today         General Comments General comments (skin integrity, edema, etc.): Lengthy discussion about importance of going to rehab versus home with [facility]. Pt is unwiling to go to rehab that isn't very close to home. If pt DCs home will need mechnical lift      Pertinent Vitals/Pain Pain Assessment: Faces Faces Pain Scale: Hurts even more Pain Location: BIL LE, BIL shoulders Pain Descriptors / Indicators: Aching;Dull Pain Intervention(s): Limited activity within patient's tolerance;Monitored during session;Premedicated before session;Repositioned           PT Goals (current goals can now be found in the care plan section) Acute Rehab PT Goals Patient Stated Goal: to go home Progress towards PT goals: Not progressing toward goals - comment    Frequency    Min 2X/week      PT Plan Current plan remains appropriate    Co-evaluation   Reason for Co-Treatment: Complexity of  the patient's impairments (multi-system involvement);Necessary to address cognition/behavior during functional activity;For  patient/therapist safety;To address functional/ADL transfers PT goals addressed during session: Mobility/safety with mobility;Balance;Proper use of DME;Strengthening/ROM        AM-PAC PT "6 Clicks" Mobility   Outcome Measure  Help needed turning from your back to your side while in a flat bed without using bedrails?: Total Help needed moving from lying on your back to sitting on the side of a flat bed without using bedrails?: Total Help needed moving to and from a bed to a chair (including a wheelchair)?: Total Help needed standing up from a chair using your arms (e.g., wheelchair or bedside chair)?: Total Help needed to walk in hospital room?: Total Help needed climbing 3-5 steps with a railing? : Total 6 Click Score: 6    End of Session Equipment Utilized During Treatment: Gait belt Activity Tolerance: Patient limited by pain;Treatment limited secondary to agitation Patient left: in bed;with call bell/phone within reach;with bed alarm set;with family/visitor present Nurse Communication: Mobility status PT Visit Diagnosis: Unsteadiness on feet (R26.81);Muscle weakness (generalized) (M62.81);History of falling (Z91.81)     Time: 8347-5830 PT Time Calculation (min) (ACUTE ONLY): 26 min  Charges:  $Therapeutic Activity: 8-22 mins                     Julaine Fusi PTA 02/24/21, 11:49 AM

## 2021-02-24 NOTE — TOC Transition Note (Addendum)
Transition of Care [facility]) - CM/SW Discharge Note   Patient Details  Name: Stephen [name] MRN: [id number] Date of Birth: 1946-04-29  [facility] of Care [facility] CM/SW Contact:  Beverly Sessions, RN Phone Number: 02/24/2021, 4:39 PM   Clinical Narrative:    Initially this morning patient and wife were agreeable to SNF and accepted at [facility].  Then patient patient changed his mind, again declined SNF and wife supported that decision  Patient to discharge home today [facility] lift and O2 to be delivered to home.  Per [employer] ETA 6pm Wife is at home waiting for O2 to be delivered.  After delivery she is to contact bedside RN.  At that time Bedside RN will call ems transport.    EMS transport packet printed to floor. Bedside RN notified.    [place] with [facility] notified of discharge.     Final next level of care: [place] Barriers to Discharge: No Barriers Identified   Patient Goals and CMS Choice        Discharge Placement                       Discharge Plan and Services   Discharge Planning Services: CM Consult                      HH Arranged: PT,OT,RN,Nurse's Aide,Social Work [facility] Agency: [facility] Date [place] [facility] Contacted: 02/24/21   Representative spoke with at [place]: [place] ([place]) Interventions     Readmission Risk Interventions No flowsheet data found.

## 2021-02-24 NOTE — Progress Notes (Signed)
Occupational Therapy Treatment Patient Details Name: Ether Wolters MRN: [id number] DOB: 09-23-1946 Today's Date: 02/24/2021    History of present illness Aseel Uhde is a 75 y.o. 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   OT comments  Pt seen for OT/PT co-treatment on this date. Upon arrival to room, pt awake in long sitting position with spouse at bedside. Pt agreeable to tx, however anxious during functional mobility, requiring TOTAL ASSIST +2 for bed mobility and TOTAL ASSIST +2 for attempting sit>stand transfers. Following x3 sit>stand attempts, pt unable to clear hips from bed due to posterior lean. While seated EOB and pt receiving MAX A for trunk support, pt able to participate in seated grooming tasks with MOD A. At end of session, OT/PT discussed POC and discharge recommendation with pt and spouse; spouse agreeable if rehab close to home however pt refusing. Pt continues to demonstrate decreased awareness of deficits. If pt discharged home, pt will required wheelchair and mechanical lift. Will continue to follow POC. Discharge recommendation remains appropriate.    Follow Up Recommendations  SNF    Equipment Recommendations  Wheelchair (measurements OT);Other (comment) (18" wheelchair and mechanical lift)       Precautions / Restrictions Precautions Precautions: Fall Restrictions Weight Bearing Restrictions: No       Mobility Bed Mobility Overal bed mobility: Needs Assistance Bed Mobility: Rolling Rolling: Total assist;+2 for physical assistance;+2 for safety/equipment Sidelying to sit: Total assist;+2 for physical assistance;+2 for safety/equipment Supine to sit: Total assist;+2 for physical assistance;+2 for safety/equipment Sit to  supine: Total assist;+2 for safety/equipment        Transfers Overall transfer level: Needs assistance Equipment used: 2 person hand held assist Transfers: Sit to/from Stand Sit to Stand: Total assist;+2 physical assistance;+2 safety/equipment;From elevated surface         General transfer comment: pt unable to stand/transfer due to sever posterior lean. Pt will require mechanical lift for home use.    Balance Overall balance assessment: Needs assistance Sitting-balance support: Feet supported;Bilateral upper extremity supported Sitting balance-Leahy Scale: Zero Sitting balance - Comments: MAX A to maintain sitting balance during seated grooming tasks at EOB Postural control: Posterior lean                                 ADL either performed or assessed with clinical judgement   ADL Overall ADL's : Needs assistance/impaired     Grooming: Wash/dry face;Set up;Bed level;Brushing hair;Moderate assistance;Sitting Grooming Details (indicate cue type and reason): While seated EOB and pt receiving MAX A for sitting balance, pt able to bring comb to head and comb ~30% of hair             Lower Body Dressing: Maximal assistance;Bed level Lower Body Dressing Details (indicate cue type and reason): to don/doff socks             Functional mobility during ADLs: Total assistance                 Cognition Arousal/Alertness: Awake/alert Behavior During Therapy: Agitated;Anxious Overall Cognitive Status: History of cognitive impairments - at baseline  General Comments: pt with anxiety during OOB mobility. Pt with decreased safety awareness and awareness of deficits, demanding to go home with current deficits              General Comments Discussion regarding POC and discharge recommendation with spouse agreeable if rehab is close to their home however pt refusing    Pertinent Vitals/ Pain       Pain  Assessment: Faces Faces Pain Scale: Hurts even more Pain Location: BIL LE, BIL shoulders Pain Descriptors / Indicators: Aching;Dull Pain Intervention(s): Limited activity within patient's tolerance;Monitored during session;Premedicated before session;Repositioned         Frequency  Min 1X/week        Progress Toward Goals  OT Goals(current goals can now be found in the care plan section)  Progress towards OT goals: Progressing toward goals  Acute Rehab OT Goals Patient Stated Goal: to go home OT Goal Formulation: With patient/family Time For Goal Achievement: 03/06/21 Potential to Achieve Goals: [place] Discharge plan remains appropriate;Frequency remains appropriate    Co-evaluation    PT/OT/SLP Co-Evaluation/Treatment: Yes Reason for Co-Treatment: Complexity of the patient's impairments (multi-system involvement);Necessary to address cognition/behavior during functional activity;For patient/therapist safety;To address functional/ADL transfers PT goals addressed during session: Mobility/safety with mobility;Balance;Proper use of DME OT goals addressed during session: ADL's and self-care      AM-PAC OT "6 Clicks" Daily Activity     Outcome Measure   Help from another person eating meals?: None Help from another person taking care of personal grooming?: A Lot Help from another person toileting, which includes using toliet, bedpan, or urinal?: Total Help from another person bathing (including washing, rinsing, drying)?: Total Help from another person to put on and taking off regular upper body clothing?: A Lot Help from another person to put on and taking off regular lower body clothing?: Total 6 Click Score: 11    End of Session Equipment Utilized During Treatment: Gait belt  OT Visit Diagnosis: Unsteadiness on feet (R26.81);Muscle weakness (generalized) (M62.81)   Activity Tolerance Treatment limited secondary to agitation   Patient Left in bed;with call  bell/phone within reach;with bed alarm set;with family/visitor present   Nurse Communication Mobility status;Need for lift equipment        Time: 1100-1125 OT Time Calculation (min): 25 min  Charges: OT General Charges $OT Visit: 1 Visit OT Treatments $Self Care/Home Management : 8-22 mins   Fredirick Maudlin, OTR/L [place]

## 2021-02-24 NOTE — Progress Notes (Signed)
COVID test sent to lab.   Fuller Mandril, RN

## 2021-02-24 NOTE — Progress Notes (Signed)
Speech Language Pathology Treatment: Dysphagia  Patient Details Name: Stephen [name] MRN: [id number] DOB: 1946/01/25 Today's Date: 02/24/2021 Time: 0932-3557 SLP Time Calculation (min) (ACUTE ONLY): 35 min  Assessment / Plan / Recommendation Clinical Impression  Pt seen today for ongoing toleration of dysphagia diet; trials at bedside. Pt continues to present w/ suspected Cognitive decline w/ impact on oropharyngeal phase swallowing/dysphagia. Any decreased awareness/attention w/ po tasks can increases risk for aspiration. MBSS this week revealed Mod. Oropharyngeal phase Dysphagia w/ Aspiration of thin liquids. Diet was then modified to Nectar consistency liquids.  He required min-mod visual/verbal cues for during bolus presentation; feeding support. Pt consumed sips of Nectar liquids via Cup w/ no immediate, overt clinical s/s of aspiration noted; no decline in phonations, no cough, and no decline in respiratory status during/post trials. Oral phase was grossly [facility] for bolus management and oral clearing of the boluses. Discussed w/ pt his recommended diet consistency of more puree foods on the Full liquid diet currently and the Nectar consistency liquids; dysphagia and need for f/u, DRY swallows w/ po's; aspiration and Pulmonary decline from Aspiration; paper work for ordering of thickened liquids; aspiration precautions; pills Whole in puree; feeding support and supervision w/ meals. Handouts on the above discussed, given as well as thickened liquids and powder for d/c home. Wife was not present; NSG will go over w/ Wife she said. Recommend continue a Dysphagia level diet (puree w/ full liquid) w/ Nectar liquids; general aspiration precautions; swallowing strategies of f/u, DRY swallows; reduce Distractions during meals. Pills Whole in Puree for safer swallowing. Support and Supervision at meals -- verbal cues for following precautions and strategies. MD/NSG/SW updated.      HPI HPI: Pt is a 75 y.o.  male with medical history significant of rectal Cancer metastasized to lung (s/p for colostomy, chemotherapy), HTn, GERD, depression, GERD, smoker, ETOH use, left leg blood clot not on anticoagulants, who presents with constipation, nausea, vomiting, abdominal pain, weakness.  Per patient and his wife, patient has been generalized weakness for almost 2 weeks. Wife endorses pt does not hydrate well at Baseline.  He has been constipated for more than a week. He developed nausea, vomiting and abdominal pain. He has 6-7 times of nonbilious nonbloody vomiting.  He has diffuse, mild to moderate abdominal pain.  No fever or chills.  Patient also has dry cough, shortness of breath.  No chest pain.  Patient complains of dysuria, burning on urination and increased urinary frequency.  Concern for Dehydration per Wife.  He continues to drink Alcohol heavily per his wife.  Pt and Wife c/o pt being "shaky" in his UEs unable to feed himself as well as his [place] being "very dark".   Per CT of Abd.: "Partial small bowel obstruction without focal transition point.  2. Small volume of reactive ascites in the right pelvis in the  region of dilated and fluid-filled small bowel.  3. Bilateral lower lobe atelectasis versus small volume aspiration  superimposed on a background of diffuse chronic bronchial wall  thickening.  4. Mild circumferential thickening of the distal esophagus concerning for esophagitis.".  A MBSS was completed on 02/22/2021 w/ moderate oropharyngeal phase dysphagia, impacted by pt's Cognitive decline/status, resulting in Aspiration of thin liquids. A Dysphagia diet was initiated then.      SLP Plan  Continue with current plan of care       Recommendations  Diet recommendations: Dysphagia 1 (puree);Nectar-thick liquid (Full liquid diet currently w/ potential to upgrade per MD) Liquids provided  via: Cup;No straw Medication Administration: Whole meds with puree (vs need to Crush if necessary and able  to) Supervision: Patient able to self feed;Staff to assist with self feeding;Intermittent supervision to cue for compensatory strategies Compensations: Minimize environmental distractions;Slow rate;Small sips/bites;Lingual sweep for clearance of pocketing;Multiple dry swallows after each bite/sip;Follow solids with liquid;Clear throat intermittently Postural Changes and/or Swallow Maneuvers: Seated upright 90 degrees;Upright 30-60 min after meal;Out of bed for meals                General recommendations:  (Palliative Care f/u at D/C for overall GOC; Dietician) Oral Care Recommendations: Oral care BID;Oral care before and after PO;Staff/trained caregiver to provide oral care Follow up Recommendations: Home health SLP (vs SNF) SLP Visit Diagnosis: Dysphagia, oropharyngeal phase (R13.12) (Cognitive decline) Plan: Continue with current plan of care       GO                 Orinda Kenner, [place], [place] Pathologist Rehab Services [phone] [facility] 02/24/2021, 5:00 PM

## 2021-02-24 NOTE — Discharge Summary (Addendum)
[facility]  Physician Discharge Summary   Patient ID: Stephen [name] MRN: [id number] DOB/AGE: 74-Aug-1947 74 y.o.  Admit date: 02/19/2021 Discharge date: 02/25/2021  PCP: [name], MD  DISCHARGE DIAGNOSES:  Aspiration pneumonia Oropharyngeal dysphagia Alcohol use disorder Partial small bowel obstruction, resolved Alcohol withdrawal syndrome, resolved Urinary tract infection Acute respiratory failure with hypoxia requiring home oxygen History of rectal cancer  RECOMMENDATIONS FOR OUTPATIENT FOLLOW UP: 1. Follow-up with PCP.  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  He was found to have a partial small bowel obstruction on CT abdomen/pelvis without a focal transition point.  Labs showed marked leukocytosis 21.6k, lactic acidosis 3.1, negative for Covid-19.  UA was consistent with infection.  Chest xray concerning for PNA with Right basilar opacity.  Admitted to hospitalist service.  General surgery consulted.    Consultations:  Gen surgery  Procedures:  None   HOSPITAL COURSE:    Alcohol use disorder with Acute alcohol withdrawal syndrome Patient went into alcohol withdrawal while he was in the hospital.  Required Precedex briefly.   Wife reports at least six liquor/whiskey drink daily, thinks more recently.   Patient was started on CIWA protocol.  His  mental status started improving.  He was weaned off of Librium.  Gabapentin continued.  Concern also for Wernicke's encephalopathy and so the patient was given high-dose IV thiamine.  Oropharyngeal dysphagia  Seen by speech therapy.  On full liquid diet with nectar thick liquids.  Aspiration precautions.  Pt is very high risk for aspiration and subsequent respiratory compromise.   Generlized Weakness / Physical Debility  SNF was recommended by physical therapy.  Patient has been going back and forth.  Finally they decided that they would like for him to go home with home health.  Partial SBO  Resolved, return of bowel function after Miralax. General surgery consulted, signed off. NG tube was not indicated, nor surgical intervention.  Severe sepsis due to UTI and pneumonia Sepsis present on admission with leukocytosis, tachycardia, tachypnea.  Lactic acidosis consistent with severe sepsis.  Treated per sepsis protocol in the ED.  Hemodynamically stable. Initially on Zosyn.  Subsequently changed over to Augmentin.  Sepsis physiology has resolved.  UTI  UA with large leukocytes, rare bacteria, >50 WBCs. Urine culture grew Enterococcus faecalis. --Abs: Zosyn >> Augmentin (4/11) --Urine culture growing Enterococcus faecalis sensitive to ampicillin  Aspiration pneumonia Chest x-ray showed hazy pulmonary opacity with asymmetric right-sided involvement more concerning for pneumonia or aspiration. Evaluated by SLP, deemed mild aspiration risk, regular diet consistency recommended with general aspiration and reflux precautions. Negative for COVID-19 and influenza A and B. --Abx: Zosyn >> Augmentin (4/11)  Acute respiratory failure with hypoxia  Patient presented with tachypnea, x-ray findings concerning for pneumonia, O2 saturation 88% on room air.  Requiring 2 to 3 L of oxygen here in the hospital.  Desaturation noted with on room air.  Will need oxygen at discharge.    Likely  OSA CPAP or BiPAP overnight  Hypokalemia  Magnesium  noted to be 1.9.    Supplemented.    Essential hypertension  May resume Benicar at discharge.    Tobacco abuse  Nicotine patch ordered.    Stage IV rectal cancer  Status post resection, with colostomy.  Reportedly finished chemotherapy in 2016. Currently no acute issues. --Palliative care consulted for goals of care discussion.  They have not seen the patient yet.  Can be pursued at the skilled nursing facility.  Depression Continue Zoloft  Lumbar compression fracture Seen on CT abdomen pelvis, L4 and L5 compression fractures new when compared To imaging from June 2021.  These have occurred in the interim.  No acute fractures seen. Mostly asymptomatic.  Moderate malnutrition Nutrition Problem: Moderate Malnutrition Etiology: chronic illness (rectal cancer with mets to lung)  Signs/Symptoms: energy intake < or equal to 75% for > or equal to 1 month,mild fat depletion,moderate fat depletion,mild muscle depletion,moderate muscle depletion  Interventions: Boost Breeze,MVI  Overall stable.  Patient's family wants to take him home.  Patient also wants to go home.  Does not want to go to skilled nursing facility.  Will be discharged home with home health.   PERTINENT LABS:  The results of significant diagnostics from this hospitalization (including imaging, microbiology, ancillary and laboratory) are listed below for reference.    Microbiology: Recent Results (from the past 240 hour(s))  Resp Panel by RT-PCR (Flu A&B, Covid) Nasopharyngeal Swab     Status: None   Collection Time: 02/19/21  5:06 AM   Specimen: Nasopharyngeal Swab; Nasopharyngeal(NP) swabs in vial transport medium  Result Value Ref Range Status   SARS Coronavirus 2 by RT PCR NEGATIVE NEGATIVE Final    Comment: (NOTE) SARS-CoV-2 target nucleic acids are NOT DETECTED.  The SARS-CoV-2 RNA is generally detectable in upper respiratory specimens during the  acute phase of infection. The lowest concentration of SARS-CoV-2 viral copies this assay can detect is 138 copies/mL. A negative result does not preclude SARS-Cov-2 infection and should not be used as the sole basis for treatment or other patient management decisions. A negative result may occur with  improper specimen collection/handling, submission of specimen other than nasopharyngeal swab, presence of viral mutation(s) within the areas targeted by this assay, and inadequate number of viral copies(<138 copies/mL). A negative result must be combined with clinical observations, patient history, and epidemiological information. The expected result is Negative.  Fact Sheet for Patients:  EntrepreneurPulse.com.au  Fact Sheet for Healthcare Providers:  IncredibleEmployment.be  This test is no t yet approved or cleared by the [place] FDA and  has been authorized for detection and/or diagnosis of SARS-CoV-2 by FDA under an Emergency Use Authorization (EUA). This EUA will remain  in effect (meaning this test can be used) for the duration of the COVID-19 declaration under Section 564(b)(1) of the Act, 21 U.S.C.section 360bbb-3(b)(1), unless the authorization is terminated  or revoked sooner.       Influenza A by PCR NEGATIVE NEGATIVE Final   Influenza B by PCR NEGATIVE NEGATIVE Final    Comment: (NOTE) The Xpert Xpress SARS-CoV-2/FLU/RSV plus assay is intended as an aid in the diagnosis of influenza from Nasopharyngeal swab specimens and should not be used as a sole basis for treatment. Nasal washings and aspirates are unacceptable for Xpert Xpress SARS-CoV-2/FLU/RSV testing.  Fact Sheet for Patients: EntrepreneurPulse.com.au  Fact Sheet for Healthcare Providers: IncredibleEmployment.be  This test is not yet approved or cleared by the [place] FDA and has been authorized for detection and/or  diagnosis of SARS-CoV-2 by  FDA under an Emergency Use Authorization (EUA). This EUA will remain in effect (meaning this test can be used) for the duration of the COVID-19 declaration under Section 564(b)(1) of the Act, 21 U.S.C. section 360bbb-3(b)(1), unless the authorization is terminated or revoked.  Performed at [facility], [address] [place], [place] [address]   Urine Culture     Status: Abnormal   Collection Time: 02/19/21  5:06 AM   Specimen: Urine, Random  Result Value Ref Range Status   Specimen Description   Final    URINE, RANDOM Performed at [facility], [address] [place], [place] [address]    Special Requests   Final    NONE Performed at [facility], [address] [place], [place] [address]    Culture >=100,000 COLONIES/mL ENTEROCOCCUS FAECALIS (A)  Final   Report Status 02/21/2021 FINAL  Final   Organism ID, Bacteria ENTEROCOCCUS FAECALIS (A)  Final      Susceptibility   Enterococcus faecalis - MIC*    AMPICILLIN <=2 SENSITIVE Sensitive     NITROFURANTOIN <=16 SENSITIVE Sensitive     VANCOMYCIN 1 SENSITIVE Sensitive     * >=100,000 COLONIES/mL ENTEROCOCCUS FAECALIS  Culture, blood (routine x 2)     Status: None   Collection Time: 02/19/21  5:12 AM   Specimen: BLOOD  Result Value Ref Range Status   Specimen Description BLOOD BLOOD RIGHT FOREARM  Final   Special Requests   Final    BOTTLES DRAWN AEROBIC AND ANAEROBIC Blood Culture adequate volume   Culture   Final    NO GROWTH 5 DAYS Performed at [facility], [address], [place], [place] [address]    Report Status 02/24/2021 FINAL  Final  Culture, blood (routine x 2)     Status: None   Collection Time: 02/19/21  5:12 AM   Specimen: BLOOD  Result Value Ref Range Status   Specimen Description BLOOD LEFT ANTECUBITAL  Final   Special Requests   Final    BOTTLES DRAWN AEROBIC AND ANAEROBIC Blood Culture adequate volume   Culture   Final    NO  GROWTH 5 DAYS Performed at [facility], [address] [place], [place] [address]    Report Status 02/24/2021 FINAL  Final  MRSA PCR Screening     Status: None   Collection Time: 02/20/21  6:09 PM   Specimen: Nasopharyngeal  Result Value Ref Range Status   MRSA by PCR NEGATIVE NEGATIVE Final    Comment:        The GeneXpert MRSA Assay (FDA approved for NASAL specimens only), is one component of a comprehensive MRSA colonization surveillance program. It is not intended to diagnose MRSA infection nor to guide or monitor treatment for MRSA infections. Performed at [facility], [address] [place], [place] [address]   Resp Panel by RT-PCR (Flu A&B, Covid) Nasopharyngeal Swab     Status: None   Collection Time: 02/24/21 12:46 PM   Specimen: Nasopharyngeal Swab; Nasopharyngeal(NP) swabs in vial transport medium  Result Value Ref Range Status   SARS Coronavirus 2 by RT PCR NEGATIVE NEGATIVE Final    Comment: (NOTE) SARS-CoV-2 target nucleic acids are NOT DETECTED.  The SARS-CoV-2 RNA is generally detectable in upper respiratory specimens during the acute phase of infection. The lowest concentration of SARS-CoV-2 viral copies this assay can detect is 138 copies/mL. A negative result does not preclude SARS-Cov-2 infection and should not be used as the sole basis  for treatment or other patient management decisions. A negative result may occur with  improper specimen collection/handling, submission of specimen other than nasopharyngeal swab, presence of viral mutation(s) within the areas targeted by this assay, and inadequate number of viral copies(<138 copies/mL). A negative result must be combined with clinical observations, patient history, and epidemiological information. The expected result is Negative.  Fact Sheet for Patients:  EntrepreneurPulse.com.au  Fact Sheet for Healthcare Providers:   IncredibleEmployment.be  This test is no t yet approved or cleared by the [place] FDA and  has been authorized for detection and/or diagnosis of SARS-CoV-2 by FDA under an Emergency Use Authorization (EUA). This EUA will remain  in effect (meaning this test can be used) for the duration of the COVID-19 declaration under Section 564(b)(1) of the Act, 21 U.S.C.section 360bbb-3(b)(1), unless the authorization is terminated  or revoked sooner.       Influenza A by PCR NEGATIVE NEGATIVE Final   Influenza B by PCR NEGATIVE NEGATIVE Final    Comment: (NOTE) The Xpert Xpress SARS-CoV-2/FLU/RSV plus assay is intended as an aid in the diagnosis of influenza from Nasopharyngeal swab specimens and should not be used as a sole basis for treatment. Nasal washings and aspirates are unacceptable for Xpert Xpress SARS-CoV-2/FLU/RSV testing.  Fact Sheet for Patients: EntrepreneurPulse.com.au  Fact Sheet for Healthcare Providers: IncredibleEmployment.be  This test is not yet approved or cleared by the [place] FDA and has been authorized for detection and/or diagnosis of SARS-CoV-2 by FDA under an Emergency Use Authorization (EUA). This EUA will remain in effect (meaning this test can be used) for the duration of the COVID-19 declaration under Section 564(b)(1) of the Act, 21 U.S.C. section 360bbb-3(b)(1), unless the authorization is terminated or revoked.  Performed at [facility], [address] [place], [place] [address]      Labs:  COVID-19 Labs    Lab Results  Component Value Date   [place] 02/24/2021   [place] NEGATIVE 02/19/2021      Basic Metabolic Panel: Recent Labs  Lab 02/20/21 0412 02/21/21 0518 02/22/21 0519 02/23/21 0507 02/24/21 0437 02/24/21 1457  NA 135 136 138 138 141  --   K 3.3* 4.2 2.9* 2.9* 2.9* 4.3  CL 104 103 104 105 106  --   CO2 22 21* 22 23 25   --    GLUCOSE 81 92 90 91 101*  --   BUN 15 16 18 19 17   --   CREATININE 0.75 0.84 0.70 0.58* 0.60*  --   CALCIUM 8.5* 8.9 8.7* 8.9 8.8*  --   MG  --  1.7 1.7 1.9  --   --   PHOS  --  2.8  --   --   --   --    Liver Function Tests: Recent Labs  Lab 02/19/21 0506  AST 56*  ALT 21  ALKPHOS 96  BILITOT 1.4*  PROT 7.8  ALBUMIN 3.4*   Recent Labs  Lab 02/19/21 0506  LIPASE 29   CBC: Recent Labs  Lab 02/19/21 0506 02/20/21 0412 02/21/21 0518 02/22/21 0519 02/23/21 0507 02/24/21 0437  WBC 21.6* 20.0* 17.5* 15.8* 14.6* 12.8*  NEUTROABS 20.0*  --  15.4* 13.4* 12.0*  --   HGB 15.4 11.6* 13.7 12.6* 11.6* 11.5*  HCT 44.0 33.2* 39.9 34.9* 33.5* 32.8*  MCV 100.5* 104.1* 104.5* 101.5* 102.4* 101.9*  PLT 309 170 215 222 231 225   BNP: BNP (last 3 results) Recent Labs    02/19/21 1202  BNP 244.2*    CBG: Recent Labs  Lab 02/20/21 1803  GLUCAP 125*     IMAGING STUDIES CT Abdomen Pelvis Wo Contrast  Result Date: 02/19/2021 CLINICAL DATA:  Constipation with no stool from colostomy for the past week and developing nausea/vomiting EXAM: CT ABDOMEN AND PELVIS WITHOUT CONTRAST TECHNIQUE: Multidetector CT imaging of the abdomen and pelvis was performed following the standard protocol without IV contrast. COMPARISON:  PET-CT 05/21/2020; CT abdomen/pelvis 05/04/2020 FINDINGS: Lower chest: Dependent airspace opacities present in both lower lobes. Moderate respiratory motion artifact. Diffuse mild bronchial wall thickening. Visualized cardiac structures are normal in size. No pericardial effusion. Mild circumferential thickening of the distal esophagus. Hepatobiliary: Small radiodensities layer in the gallbladder lumen consistent with cholelithiasis. No inflammatory changes or pericholecystic fluid. Stable small probable hepatic cyst. No solid lesion. No intra or extrahepatic biliary ductal dilatation. Pancreas: Unremarkable. No pancreatic ductal dilatation or surrounding inflammatory  changes. Spleen: Normal in size without focal abnormality. Adrenals/Urinary Tract: Unremarkable adrenal glands. No hydronephrosis or nephrolithiasis. Stable exophytic cyst from the right kidney. The bladder is relatively decompressed. Unremarkable ureters. Stomach/Bowel: Multiple loops of dilated and fluid-filled small bowel with internal air-fluid levels present within the low midline abdomen. No focal transition point. There is gradual tapering to normal caliber. The colon is decompressed. Normal appendix in the right lower quadrant. Surgical changes of probable prior low anterior resection with left lower quadrant end colostomy. Vascular/Lymphatic: Limited evaluation in the absence of intravenous contrast. Atherosclerotic calcifications present along the abdominal aorta. No aneurysm. No suspicious lymphadenopathy. Reproductive: Prostate is unremarkable. Other: Small volume ascites in the right anatomic pelvis. Musculoskeletal: Compression fracture of L4 with approximately 60% height loss. Compression fracture of L5 with approximately 30% height loss. Findings are new compared to prior imaging from June of 2021. IMPRESSION: 1. Partial small bowel obstruction without focal transition point. 2. Small volume of reactive ascites in the right pelvis in the region of dilated and fluid-filled small bowel. 3. Bilateral lower lobe atelectasis versus small volume aspiration superimposed on a background of diffuse chronic bronchial wall thickening. 4. Mild circumferential thickening of the distal esophagus concerning for esophagitis. Does the patient have a clinical history of GERD? 5. Cholelithiasis. 6. Surgical changes of prior low anterior resection with left lower quadrant colostomy. 7.  Aortic Atherosclerosis (ICD10-170.0) 8. L4 and L5 compression fractures are new compared to prior imaging from June of 2021 and have likely occurred at some point in the interim. No findings on today's examination to suggest acute  fractures. Electronically Signed   By: Jacqulynn Cadet M.D.   On: 02/19/2021 07:26   US Venous Img Upper Uni Right(DVT)  Result Date: 02/23/2021 CLINICAL DATA:  Right upper extremity edema EXAM: RIGHT UPPER EXTREMITY VENOUS DOPPLER ULTRASOUND TECHNIQUE: Gray-scale sonography with graded compression, as well as color Doppler and duplex ultrasound were performed to evaluate the upper extremity deep venous system from the level of the subclavian vein and including the jugular, axillary, basilic, radial, ulnar and upper cephalic vein. Spectral Doppler was utilized to evaluate flow at rest and with distal augmentation maneuvers. COMPARISON:  None. FINDINGS: Contralateral Subclavian Vein: Respiratory phasicity is normal and symmetric with the symptomatic side. No evidence of thrombus. Normal compressibility. Internal Jugular Vein: No evidence of thrombus. Normal compressibility, respiratory phasicity and response to augmentation. Subclavian Vein: No evidence of thrombus. Normal compressibility, respiratory phasicity and response to augmentation. Axillary Vein: No evidence of thrombus. Normal compressibility, respiratory phasicity and response to augmentation. Cephalic Vein: No evidence of thrombus.  Normal compressibility, respiratory phasicity and response to augmentation. Basilic Vein: No evidence of thrombus. Normal compressibility, respiratory phasicity and response to augmentation. Brachial Veins: No evidence of thrombus. Normal compressibility, respiratory phasicity and response to augmentation. Radial Veins: No evidence of thrombus. Normal compressibility, respiratory phasicity and response to augmentation. Ulnar Veins: No evidence of thrombus. Normal compressibility, respiratory phasicity and response to augmentation. IMPRESSION: No evidence of DVT within the right upper extremity. Electronically Signed   By: Jerilynn Mages.  Shick M.D.   On: 02/23/2021 08:46   DG Chest Port 1 View  Result Date: 02/19/2021 CLINICAL  DATA:  Hypoxia.  Constipation for 1 week with emesis. EXAM: PORTABLE CHEST 1 VIEW COMPARISON:  Chest CT 11/16/2020. FINDINGS: Interstitial and hazy airspace opacity asymmetric to the right base. Some more rounded density at the medial right base but no nodule was seen in this area on recent chest CT. No pleural effusion or pneumothorax. Normal heart size and mediastinal contours. IMPRESSION: Hazy pulmonary opacity with asymmetric right-sided involvement more concerning for pneumonia or aspiration. Electronically Signed   By: Monte Fantasia M.D.   On: 02/19/2021 05:17    DISCHARGE EXAMINATION: See progress note from earlier today  DISPOSITION: Home  Discharge Instructions    Call MD for:  difficulty breathing, headache or visual disturbances   Complete by: As directed    Call MD for:  extreme fatigue   Complete by: As directed    Call MD for:  persistant dizziness or light-headedness   Complete by: As directed    Call MD for:  persistant nausea and vomiting   Complete by: As directed    Call MD for:  severe uncontrolled pain   Complete by: As directed    Call MD for:  temperature >100.4   Complete by: As directed    Discharge diet:   Complete by: As directed    Diet recommendation at D/C:  Full liquid diet w/ NECTAR consistency liquids d/t aspiration risk - Cup drinking(Pt to Hold Cup); strict aspiration and REFLUX precautions. Swallowing strategies to include DRY swallow b/t trials, and Time b/t trials fed. Recommend Pills given in Puree, whole vs crushed. Feeding Support and Monitoring at all meals, po's.   Discharge instructions   Complete by: As directed    Please be sure to f/u with your PCP in 1 week.  You were cared for by a hospitalist during your hospital stay. If you have any questions about your discharge medications or the care you received while you were in the hospital after you are discharged, you can call the unit and asked to speak with the hospitalist on call if the  hospitalist that took care of you is not available. Once you are discharged, your primary care physician will handle any further medical issues. Please note that NO REFILLS for any discharge medications will be authorized once you are discharged, as it is imperative that you return to your primary care physician (or establish a relationship with a primary care physician if you do not have one) for your aftercare needs so that they can reassess your need for medications and monitor your lab values. If you do not have a primary care physician, you can call [phone] for a physician referral.   Increase activity slowly   Complete by: As directed          Allergies as of 02/24/2021      Reactions   No Known Allergies       Medication List  STOP taking these medications   pyridOXINE 50 MG tablet Commonly known as: B-6     TAKE these medications   amoxicillin-clavulanate 875-125 MG tablet Commonly known as: AUGMENTIN Take 1 tablet by mouth every 12 (twelve) hours for 4 days.   aspirin EC 81 MG tablet Take 81 mg by mouth daily.   bisacodyl 5 MG EC tablet Commonly known as: DULCOLAX Take 5 mg by mouth daily as needed for moderate constipation.   esomeprazole 20 MG packet Commonly known as: NEXIUM Take 20 mg by mouth daily before breakfast.   folic acid 1 MG tablet Commonly known as: FOLVITE Take 1 tablet (1 mg total) by mouth daily.   gabapentin 100 MG capsule Commonly known as: NEURONTIN Take 2 capsules (200 mg total) by mouth 2 (two) times daily.   loratadine 10 MG tablet Commonly known as: CLARITIN Take 10 mg by mouth daily as needed.   multivitamin with minerals Tabs tablet Take 1 tablet by mouth daily.   nicotine 14 mg/24hr patch Commonly known as: NICODERM CQ - dosed in mg/24 hours Place 1 patch (14 mg total) onto the skin daily. Notes to patient: Make sure to remove previous patch before placing new patch. Wash hands afterwards.    olmesartan 20 MG  tablet Commonly known as: BENICAR Take 20 mg by mouth daily.   oxyCODONE-acetaminophen 7.5-325 MG tablet Commonly known as: Percocet Take 1 tablet by mouth every 6 (six) hours as needed for severe pain.   polyethylene glycol 17 g packet Commonly known as: MIRALAX / GLYCOLAX Take 17 g by mouth daily.   sertraline 25 MG tablet Commonly known as: ZOLOFT Take 25 mg by mouth daily.   vitamin B-1 250 MG tablet Take 1 tablet (250 mg total) by mouth daily.         Contact information for follow-up providers    [name], MD. Schedule an appointment as soon as possible for a visit in 1 week(s).   Specialty: Family Medicine Contact information: [place] [address] [phone]            Contact information for after-discharge care    Destination    [facility] .   Service: Skilled Nursing Contact information: [address] [place] [phone]                  TOTAL DISCHARGE TIME: 73 minutes  [facility] Pager on [email or web address]  02/25/2021, 11:55 AM

## 2021-02-24 NOTE — Progress Notes (Signed)
Patient's spouse stated O2 setup was delivered and was being installed. EMS called. Patient first on list.   Fuller Mandril, RN

## 2021-03-05 ENCOUNTER — Emergency Department: Payer: Medicare Other

## 2021-03-05 ENCOUNTER — Other Ambulatory Visit: Payer: Self-pay

## 2021-03-05 ENCOUNTER — Inpatient Hospital Stay
Admission: EM | Admit: 2021-03-05 | Discharge: 2021-03-08 | DRG: 177 | Disposition: A | Discharge status: Home Health | Payer: Medicare Other | Attending: Internal Medicine | Admitting: Internal Medicine

## 2021-03-05 DIAGNOSIS — J189 Pneumonia, unspecified organism: Secondary | ICD-10-CM

## 2021-03-05 DIAGNOSIS — R4182 Altered mental status, unspecified: Secondary | ICD-10-CM | POA: Diagnosis present

## 2021-03-05 DIAGNOSIS — Z7401 Bed confinement status: Secondary | ICD-10-CM

## 2021-03-05 DIAGNOSIS — C20 Malignant neoplasm of rectum: Secondary | ICD-10-CM | POA: Diagnosis present

## 2021-03-05 DIAGNOSIS — Z933 Colostomy status: Secondary | ICD-10-CM

## 2021-03-05 DIAGNOSIS — F17200 Nicotine dependence, unspecified, uncomplicated: Secondary | ICD-10-CM | POA: Diagnosis not present

## 2021-03-05 DIAGNOSIS — Z9221 Personal history of antineoplastic chemotherapy: Secondary | ICD-10-CM

## 2021-03-05 DIAGNOSIS — K219 Gastro-esophageal reflux disease without esophagitis: Secondary | ICD-10-CM | POA: Diagnosis not present

## 2021-03-05 DIAGNOSIS — R1312 Dysphagia, oropharyngeal phase: Secondary | ICD-10-CM | POA: Diagnosis not present

## 2021-03-05 DIAGNOSIS — L8940 Pressure ulcer of contiguous site of back, buttock and hip, unspecified stage: Secondary | ICD-10-CM | POA: Diagnosis not present

## 2021-03-05 DIAGNOSIS — J690 Pneumonitis due to inhalation of food and vomit: Principal | ICD-10-CM | POA: Diagnosis present

## 2021-03-05 DIAGNOSIS — G629 Polyneuropathy, unspecified: Secondary | ICD-10-CM | POA: Diagnosis present

## 2021-03-05 DIAGNOSIS — Z8249 Family history of ischemic heart disease and other diseases of the circulatory system: Secondary | ICD-10-CM | POA: Diagnosis not present

## 2021-03-05 DIAGNOSIS — F17210 Nicotine dependence, cigarettes, uncomplicated: Secondary | ICD-10-CM | POA: Diagnosis present

## 2021-03-05 DIAGNOSIS — Z825 Family history of asthma and other chronic lower respiratory diseases: Secondary | ICD-10-CM | POA: Diagnosis not present

## 2021-03-05 DIAGNOSIS — I10 Essential (primary) hypertension: Secondary | ICD-10-CM | POA: Diagnosis not present

## 2021-03-05 DIAGNOSIS — Z789 Other specified health status: Secondary | ICD-10-CM

## 2021-03-05 DIAGNOSIS — F1090 Alcohol use, unspecified, uncomplicated: Secondary | ICD-10-CM

## 2021-03-05 DIAGNOSIS — J9601 Acute respiratory failure with hypoxia: Secondary | ICD-10-CM | POA: Diagnosis not present

## 2021-03-05 DIAGNOSIS — Z20822 Contact with and (suspected) exposure to covid-19: Secondary | ICD-10-CM | POA: Diagnosis present

## 2021-03-05 DIAGNOSIS — Z66 Do not resuscitate: Secondary | ICD-10-CM | POA: Diagnosis not present

## 2021-03-05 DIAGNOSIS — C7800 Secondary malignant neoplasm of unspecified lung: Secondary | ICD-10-CM | POA: Diagnosis not present

## 2021-03-05 DIAGNOSIS — F32A Depression, unspecified: Secondary | ICD-10-CM | POA: Diagnosis present

## 2021-03-05 DIAGNOSIS — Z79899 Other long term (current) drug therapy: Secondary | ICD-10-CM

## 2021-03-05 DIAGNOSIS — Z9981 Dependence on supplemental oxygen: Secondary | ICD-10-CM | POA: Diagnosis not present

## 2021-03-05 DIAGNOSIS — Z809 Family history of malignant neoplasm, unspecified: Secondary | ICD-10-CM

## 2021-03-05 DIAGNOSIS — N390 Urinary tract infection, site not specified: Secondary | ICD-10-CM | POA: Diagnosis present

## 2021-03-05 DIAGNOSIS — T17800S Unspecified foreign body in other parts of respiratory tract causing asphyxiation, sequela: Secondary | ICD-10-CM

## 2021-03-05 DIAGNOSIS — Z7289 Other problems related to lifestyle: Secondary | ICD-10-CM | POA: Diagnosis not present

## 2021-03-05 DIAGNOSIS — L8990 Pressure ulcer of unspecified site, unspecified stage: Secondary | ICD-10-CM | POA: Insufficient documentation

## 2021-03-05 DIAGNOSIS — J9621 Acute and chronic respiratory failure with hypoxia: Secondary | ICD-10-CM | POA: Diagnosis not present

## 2021-03-05 DIAGNOSIS — Z7982 Long term (current) use of aspirin: Secondary | ICD-10-CM

## 2021-03-05 DIAGNOSIS — L89152 Pressure ulcer of sacral region, stage 2: Secondary | ICD-10-CM | POA: Diagnosis present

## 2021-03-05 DIAGNOSIS — Z85048 Personal history of other malignant neoplasm of rectum, rectosigmoid junction, and anus: Secondary | ICD-10-CM | POA: Diagnosis not present

## 2021-03-05 LAB — CBC WITH DIFFERENTIAL/PLATELET
Abs Immature Granulocytes: 0.11 10*3/uL — ABNORMAL HIGH (ref 0.00–0.07)
Basophils Absolute: 0.1 10*3/uL (ref 0.0–0.1)
Basophils Relative: 1 %
Eosinophils Absolute: 0.3 10*3/uL (ref 0.0–0.5)
Eosinophils Relative: 2 %
HCT: 37.4 % — ABNORMAL LOW (ref 39.0–52.0)
Hemoglobin: 12.5 g/dL — ABNORMAL LOW (ref 13.0–17.0)
Immature Granulocytes: 1 %
Lymphocytes Relative: 8 %
Lymphs Abs: 1.5 10*3/uL (ref 0.7–4.0)
MCH: 34.4 pg — ABNORMAL HIGH (ref 26.0–34.0)
MCHC: 33.4 g/dL (ref 30.0–36.0)
MCV: 103 fL — ABNORMAL HIGH (ref 80.0–100.0)
Monocytes Absolute: 1.4 10*3/uL — ABNORMAL HIGH (ref 0.1–1.0)
Monocytes Relative: 7 %
Neutro Abs: 14.9 10*3/uL — ABNORMAL HIGH (ref 1.7–7.7)
Neutrophils Relative %: 81 %
Platelets: 327 10*3/uL (ref 150–400)
RBC: 3.63 MIL/uL — ABNORMAL LOW (ref 4.22–5.81)
RDW: 13.6 % (ref 11.5–15.5)
WBC: 18.3 10*3/uL — ABNORMAL HIGH (ref 4.0–10.5)
nRBC: 0 % (ref 0.0–0.2)

## 2021-03-05 LAB — RESP PANEL BY RT-PCR (FLU A&B, COVID) ARPGX2
Influenza A by PCR: NEGATIVE
Influenza B by PCR: NEGATIVE
SARS Coronavirus 2 by RT PCR: NEGATIVE

## 2021-03-05 LAB — URINALYSIS, COMPLETE (UACMP) WITH MICROSCOPIC
Bilirubin Urine: NEGATIVE
Glucose, UA: NEGATIVE mg/dL
Hgb urine dipstick: NEGATIVE
Ketones, ur: 5 mg/dL — AB
Nitrite: NEGATIVE
Protein, ur: NEGATIVE mg/dL
Specific Gravity, Urine: 1.039 — ABNORMAL HIGH (ref 1.005–1.030)
Squamous Epithelial / HPF: NONE SEEN (ref 0–5)
pH: 5 (ref 5.0–8.0)

## 2021-03-05 LAB — COMPREHENSIVE METABOLIC PANEL
ALT: 25 U/L (ref 0–44)
AST: 39 U/L (ref 15–41)
Albumin: 2.4 g/dL — ABNORMAL LOW (ref 3.5–5.0)
Alkaline Phosphatase: 116 U/L (ref 38–126)
Anion gap: 9 (ref 5–15)
BUN: 19 mg/dL (ref 8–23)
CO2: 28 mmol/L (ref 22–32)
Calcium: 9 mg/dL (ref 8.9–10.3)
Chloride: 103 mmol/L (ref 98–111)
Creatinine, Ser: 0.8 mg/dL (ref 0.61–1.24)
GFR, Estimated: >60 mL/min (ref >60)
Glucose, Bld: 88 mg/dL (ref 70–99)
Potassium: 3.7 mmol/L (ref 3.5–5.1)
Sodium: 140 mmol/L (ref 135–145)
Total Bilirubin: 0.8 mg/dL (ref 0.3–1.2)
Total Protein: 7.5 g/dL (ref 6.5–8.1)

## 2021-03-05 LAB — URINE DRUG SCREEN, QUALITATIVE (ARMC ONLY)
Amphetamines, Ur Screen: NOT DETECTED
Barbiturates, Ur Screen: NOT DETECTED
Benzodiazepine, Ur Scrn: POSITIVE — AB
Cannabinoid 50 Ng, Ur ~~LOC~~: NOT DETECTED
Cocaine Metabolite,Ur ~~LOC~~: NOT DETECTED
MDMA (Ecstasy)Ur Screen: NOT DETECTED
Methadone Scn, Ur: NOT DETECTED
Opiate, Ur Screen: NOT DETECTED
Phencyclidine (PCP) Ur S: NOT DETECTED
Tricyclic, Ur Screen: NOT DETECTED

## 2021-03-05 LAB — LACTIC ACID, PLASMA
Lactic Acid, Venous: 0.9 mmol/L (ref 0.5–1.9)
Lactic Acid, Venous: 0.8 mmol/L (ref 0.5–1.9)

## 2021-03-05 LAB — TROPONIN I (HIGH SENSITIVITY)
Troponin I (High Sensitivity): 8 ng/L (ref <18)
Troponin I (High Sensitivity): 8 ng/L (ref <18)

## 2021-03-05 LAB — PROCALCITONIN: Procalcitonin: 0.13 ng/mL

## 2021-03-05 LAB — MRSA PCR SCREENING: MRSA by PCR: NEGATIVE

## 2021-03-05 LAB — ETHANOL: Alcohol, Ethyl (B): <10 mg/dL (ref <10)

## 2021-03-05 LAB — LIPASE, BLOOD: Lipase: 22 U/L (ref 11–51)

## 2021-03-05 LAB — BRAIN NATRIURETIC PEPTIDE: B Natriuretic Peptide: 92.8 pg/mL (ref 0.0–100.0)

## 2021-03-05 MED ORDER — VITAMIN D 25 MCG (1000 UNIT) PO TABS
1000.0000 [IU] | ORAL_TABLET | Freq: Every day | ORAL | Status: DC
  Administered 2021-03-06 – 2021-03-08 (×3): 1000 [IU] via ORAL
  Filled 2021-03-05 (×3): qty 1

## 2021-03-05 MED ORDER — ENOXAPARIN SODIUM 40 MG/0.4ML ~~LOC~~ SOLN
40.0000 mg | SUBCUTANEOUS | Status: DC
  Administered 2021-03-05 – 2021-03-07 (×3): 40 mg via SUBCUTANEOUS
  Filled 2021-03-05 (×3): qty 0.4

## 2021-03-05 MED ORDER — LORAZEPAM 2 MG/ML IJ SOLN: 0.0000 mg | Freq: Two times a day (BID) | INTRAMUSCULAR | Status: DC

## 2021-03-05 MED ORDER — VITAMIN B-12 100 MCG PO TABS
100.0000 ug | ORAL_TABLET | Freq: Every day | ORAL | Status: DC
  Administered 2021-03-06 – 2021-03-08 (×3): 100 ug via ORAL
  Filled 2021-03-05 (×4): qty 1

## 2021-03-05 MED ORDER — LORAZEPAM 2 MG/ML IJ SOLN: 0.0000 mg | Freq: Four times a day (QID) | INTRAMUSCULAR | Status: DC

## 2021-03-05 MED ORDER — PANTOPRAZOLE SODIUM 40 MG PO TBEC
40.0000 mg | DELAYED_RELEASE_TABLET | Freq: Every day | ORAL | Status: DC
  Administered 2021-03-06 – 2021-03-08 (×3): 40 mg via ORAL
  Filled 2021-03-05 (×3): qty 1

## 2021-03-05 MED ORDER — FOLIC ACID 1 MG PO TABS: 1.0000 mg | ORAL_TABLET | Freq: Every day | ORAL | Status: DC

## 2021-03-05 MED ORDER — METRONIDAZOLE IN NACL 5-0.79 MG/ML-% IV SOLN
500.0000 mg | Freq: Three times a day (TID) | INTRAVENOUS | Status: DC
  Administered 2021-03-05 – 2021-03-06 (×4): 500 mg via INTRAVENOUS
  Filled 2021-03-05 (×5): qty 100

## 2021-03-05 MED ORDER — LORATADINE 10 MG PO TABS: 10.0000 mg | ORAL_TABLET | Freq: Every day | ORAL | Status: DC | PRN

## 2021-03-05 MED ORDER — ACETAMINOPHEN 325 MG PO TABS: 650.0000 mg | ORAL_TABLET | Freq: Four times a day (QID) | ORAL | Status: DC | PRN

## 2021-03-05 MED ORDER — VANCOMYCIN HCL 750 MG/150ML IV SOLN
750.0000 mg | Freq: Two times a day (BID) | INTRAVENOUS | Status: DC
  Administered 2021-03-05 (×2): 750 mg via INTRAVENOUS
  Filled 2021-03-05 (×4): qty 150

## 2021-03-05 MED ORDER — SODIUM CHLORIDE 0.9 % IV SOLN
2.0000 g | Freq: Three times a day (TID) | INTRAVENOUS | Status: DC
  Administered 2021-03-05 – 2021-03-06 (×2): 2 g via INTRAVENOUS
  Filled 2021-03-05 (×4): qty 2

## 2021-03-05 MED ORDER — IOHEXOL 350 MG/ML SOLN
75.0000 mL | Freq: Once | INTRAVENOUS | Status: AC | PRN
  Administered 2021-03-05: 75 mL via INTRAVENOUS

## 2021-03-05 MED ORDER — ALBUTEROL SULFATE (2.5 MG/3ML) 0.083% IN NEBU: 2.5000 mg | INHALATION_SOLUTION | RESPIRATORY_TRACT | Status: DC | PRN

## 2021-03-05 MED ORDER — SODIUM CHLORIDE 0.9 % IV BOLUS
1000.0000 mL | Freq: Once | INTRAVENOUS | Status: AC
  Administered 2021-03-05: 1000 mL via INTRAVENOUS

## 2021-03-05 MED ORDER — ASCORBIC ACID 500 MG PO TABS
250.0000 mg | ORAL_TABLET | Freq: Every day | ORAL | Status: DC
  Administered 2021-03-06 – 2021-03-07 (×2): 250 mg via ORAL
  Filled 2021-03-05 (×2): qty 1

## 2021-03-05 MED ORDER — LORAZEPAM 1 MG PO TABS: 1.0000 mg | ORAL_TABLET | ORAL | Status: DC | PRN

## 2021-03-05 MED ORDER — THIAMINE HCL 100 MG PO TABS
100.0000 mg | ORAL_TABLET | Freq: Every day | ORAL | Status: DC
  Administered 2021-03-06 – 2021-03-08 (×3): 100 mg via ORAL
  Filled 2021-03-05 (×3): qty 1

## 2021-03-05 MED ORDER — ORAL CARE MOUTH RINSE
15.0000 mL | Freq: Two times a day (BID) | OROMUCOSAL | Status: DC
  Administered 2021-03-05 – 2021-03-08 (×7): 15 mL via OROMUCOSAL
  Filled 2021-03-05 (×2): qty 15

## 2021-03-05 MED ORDER — VANCOMYCIN HCL IN DEXTROSE 1-5 GM/200ML-% IV SOLN
1000.0000 mg | Freq: Once | INTRAVENOUS | Status: AC
  Administered 2021-03-05: 1000 mg via INTRAVENOUS
  Filled 2021-03-05: qty 200

## 2021-03-05 MED ORDER — ONDANSETRON HCL 4 MG/2ML IJ SOLN: 4.0000 mg | Freq: Three times a day (TID) | INTRAMUSCULAR | Status: DC | PRN

## 2021-03-05 MED ORDER — DM-GUAIFENESIN ER 30-600 MG PO TB12: 1.0000 | ORAL_TABLET | Freq: Two times a day (BID) | ORAL | Status: DC | PRN

## 2021-03-05 MED ORDER — HYDRALAZINE HCL 20 MG/ML IJ SOLN: 5.0000 mg | INTRAMUSCULAR | Status: DC | PRN

## 2021-03-05 MED ORDER — SODIUM CHLORIDE 0.9 % IV BOLUS: 1000.0000 mL | Freq: Once | INTRAVENOUS | Status: DC

## 2021-03-05 MED ORDER — SODIUM CHLORIDE 0.9 % IV SOLN
2.0000 g | Freq: Once | INTRAVENOUS | Status: AC
  Administered 2021-03-05: 2 g via INTRAVENOUS
  Filled 2021-03-05: qty 2

## 2021-03-05 MED ORDER — GABAPENTIN 100 MG PO CAPS
200.0000 mg | ORAL_CAPSULE | Freq: Two times a day (BID) | ORAL | Status: DC
  Administered 2021-03-05 – 2021-03-08 (×6): 200 mg via ORAL
  Filled 2021-03-05 (×6): qty 2

## 2021-03-05 MED ORDER — OXYCODONE-ACETAMINOPHEN 7.5-325 MG PO TABS
1.0000 | ORAL_TABLET | Freq: Four times a day (QID) | ORAL | Status: DC | PRN
  Administered 2021-03-05 – 2021-03-06 (×2): 1 via ORAL
  Filled 2021-03-05 (×3): qty 1

## 2021-03-05 MED ORDER — ESOMEPRAZOLE MAGNESIUM 20 MG PO PACK: 20.0000 mg | PACK | Freq: Every day | ORAL | Status: DC

## 2021-03-05 MED ORDER — THIAMINE HCL 100 MG/ML IJ SOLN
100.0000 mg | Freq: Every day | INTRAMUSCULAR | Status: DC
  Administered 2021-03-05: 100 mg via INTRAVENOUS
  Filled 2021-03-05 (×2): qty 2

## 2021-03-05 MED ORDER — ASPIRIN EC 81 MG PO TBEC
81.0000 mg | DELAYED_RELEASE_TABLET | Freq: Every day | ORAL | Status: DC
  Administered 2021-03-06 – 2021-03-08 (×3): 81 mg via ORAL
  Filled 2021-03-05 (×3): qty 1

## 2021-03-05 MED ORDER — ADULT MULTIVITAMIN W/MINERALS CH
1.0000 | ORAL_TABLET | Freq: Every day | ORAL | Status: DC
  Administered 2021-03-06 – 2021-03-08 (×3): 1 via ORAL
  Filled 2021-03-05 (×3): qty 1

## 2021-03-05 MED ORDER — FOLIC ACID 5 MG/ML IJ SOLN
1.0000 mg | Freq: Every day | INTRAMUSCULAR | Status: DC
  Administered 2021-03-05 – 2021-03-06 (×2): 1 mg via INTRAVENOUS
  Filled 2021-03-05 (×3): qty 0.2

## 2021-03-05 MED ORDER — ACETAMINOPHEN 650 MG RE SUPP: 650.0000 mg | Freq: Four times a day (QID) | RECTAL | Status: DC | PRN

## 2021-03-05 MED ORDER — NICOTINE 21 MG/24HR TD PT24
21.0000 mg | MEDICATED_PATCH | Freq: Every day | TRANSDERMAL | Status: DC
  Administered 2021-03-05 – 2021-03-08 (×4): 21 mg via TRANSDERMAL
  Filled 2021-03-05 (×4): qty 1

## 2021-03-05 MED ORDER — LORAZEPAM 2 MG/ML IJ SOLN: 1.0000 mg | INTRAMUSCULAR | Status: DC | PRN

## 2021-03-05 NOTE — Progress Notes (Signed)
Patient's wife at bedside- updated

## 2021-03-05 NOTE — ED Triage Notes (Signed)
Pt BIBA for increased SOB and weakness x 3 days. Pt c/o fever last night of 105F. Pt O2 sat 85% on baseline 3 L at home. Pt was d/c from [facility] for sepsis and PNA on 4/13. BP 125/80, HR 75, 22 RR, and 98% NRB on arrival.

## 2021-03-05 NOTE — Consult Note (Signed)
PHARMACY -  BRIEF ANTIBIOTIC NOTE   Pharmacy has received consult(s) for cefepime and vancomycin from an ED provider. Patient is also ordered metronidazole. The patient's profile has been reviewed for ht/wt/allergies/indication/available labs.    One time order(s) placed for --Cefepime 2 g --Vancomycin 1 g   Further antibiotics/pharmacy consults should be ordered by admitting physician if indicated.                       Thank you, Benita Gutter 03/05/2021  11:29 AM

## 2021-03-05 NOTE — ED Notes (Signed)
Patient transported to CT 

## 2021-03-05 NOTE — ED Provider Notes (Addendum)
[facility] Emergency Department Provider Note  ____________________________________________   Event Date/Time   First MD Initiated Contact with Patient 03/05/21 [phone]     (approximate)  I have reviewed the triage vital signs and the nursing notes.   HISTORY  Chief Complaint Shortness of Breath and Weakness    HPI Stephen [name] is a 75 y.o. 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  According to EMS patient has not been eating or drinking as much and has seemed more weak.  They were concerned that this is been going on for 3 days, constant, nothing makes it better, nothing makes it worse.  They are also concerned that his breathing was heavier and they reported a temperature of 105 last night.  According to fire patient was 85% on his baseline 3 L however with EMS they placed him on a nonrebreather and his saturations have been 98 to 99%.  Patient was afebrile with EMS.  Patient is somewhat difficult to understand secondary to a lot of congestion in his throat but he is alert and oriented x3 and does report feeling weak and also having abdominal pain when I push on his abdomen.  Unclear exactly when that started.  On review of records patient was admitted from 4/8 until 4/14 2022 secondary to partial small bowel obstruction on CT scan.  This resolved after MiraLAX and patient did not require any surgery.  Patient was also noted to be septic with concerns for pneumonia and UTI and white count of 21,000.  To note patient does also have a history of alcohol withdrawal and went into withdrawl during last admission requiring Precedex.  Patient was also given IV thiamine for concern for Warnicke's encephalopathy.  Patient was also noted to have a lot of oropharyngeal  dysphagia and was high risk for aspiration.  When patient was seen by physical therapy they recommended SNF but family decided that they would go back home.  To note patient was discharged on 3 L of oxygen.          Past Medical History:  Diagnosis Date   Allergic rhinitis    Blood clot in vein 2015   left leg    History of anemia    History of chicken pox    HTN (hypertension)    Neuropathy    arms and legs - S/P Cancer tx   Rectal cancer metastasized to lung [facility]) 09-2013   surgery and chemo   Smoker     Patient Active Problem List   Diagnosis Date Noted   Malnutrition of moderate degree 02/20/2021   Alcohol withdrawal syndrome ([place]) 02/20/2021   Aspiration into lower respiratory tract, sequela 02/19/2021   UTI (urinary tract infection) 02/19/2021   Acute respiratory failure with hypoxia ([place]) 02/19/2021   Aspiration pneumonia ([place]) 02/19/2021   Partial small bowel obstruction ([place]) 02/19/2021   Severe sepsis ([place]) 02/19/2021   GERD (gastroesophageal reflux disease) 02/19/2021   Depression 02/19/2021   Alcohol use 02/19/2021   Rectal cancer ([place]) 11/15/2013   History of anemia    HTN (hypertension)    Smoker     Past Surgical History:  Procedure Laterality Date   COLON RESECTION  2015   COLONOSCOPY  2015   EUS N/A 11/15/2013   Procedure: LOWER ENDOSCOPIC ULTRASOUND (EUS);  Surgeon: Quillian Quince  Merrily Brittle, MD;  Location: [facility];  Service: Endoscopy;  Laterality: N/A;   LUNG BIOPSY     LUNG LOBECTOMY Left    lung removed  2015   left lower    PLANTAR FASCIA RELEASE Right 09/20/2017   Procedure: PLANTAR FASCIA [id number], excision plantar fibroma right foot;  Surgeon: Samara Deist, DPM;  Location: [facility];  Service: Podiatry;  Laterality: Right;   TONSILLECTOMY AND ADENOIDECTOMY  Childhood   VARICOSE VEIN SURGERY  2010    Prior to Admission medications   Medication Sig Start Date End Date Taking? Authorizing Provider  aspirin EC 81 MG tablet Take 81  mg by mouth daily.    [provider]  bisacodyl (DULCOLAX) 5 MG EC tablet Take 5 mg by mouth daily as needed for moderate constipation.    [provider]  esomeprazole (NEXIUM) 20 MG packet Take 20 mg by mouth daily before breakfast.    [provider]  folic acid (FOLVITE) 1 MG tablet Take 1 tablet (1 mg total) by mouth daily. 02/25/21 04/26/21  Bonnielee Haff, MD  gabapentin (NEURONTIN) 100 MG capsule Take 2 capsules (200 mg total) by mouth 2 (two) times daily. 02/24/21 04/25/21  Bonnielee Haff, MD  loratadine (CLARITIN) 10 MG tablet Take 10 mg by mouth daily as needed.    [provider]  Multiple Vitamin (MULTIVITAMIN WITH MINERALS) TABS tablet Take 1 tablet by mouth daily. 02/25/21   Bonnielee Haff, MD  nicotine (NICODERM CQ - DOSED IN MG/24 HOURS) 14 mg/24hr patch Place 1 patch (14 mg total) onto the skin daily. 02/25/21 03/27/21  Bonnielee Haff, MD  olmesartan (BENICAR) 20 MG tablet Take 20 mg by mouth daily.    [provider]  oxyCODONE-acetaminophen (PERCOCET) 7.5-325 MG tablet Take 1 tablet by mouth every 6 (six) hours as needed for severe pain. 06/24/20   Sable Feil, PA-C  polyethylene glycol (MIRALAX / GLYCOLAX) 17 g packet Take 17 g by mouth daily. 02/24/21 03/26/21  Bonnielee Haff, MD  sertraline (ZOLOFT) 25 MG tablet Take 25 mg by mouth daily. 12/19/20   [provider]  Thiamine HCl (VITAMIN B-1) 250 MG tablet Take 1 tablet (250 mg total) by mouth daily. 02/24/21 04/25/21  Bonnielee Haff, MD    Allergies No known allergies  Family History  Problem Relation Age of Onset   Hypertension Mother    Coronary artery disease Mother        angina   Cancer Mother        breast   Cancer Father 3       esophageal   COPD Brother        emphysema   Cancer Paternal Grandmother        colon   Diabetes Neg Hx     Social History Social History   Tobacco Use   Smoking status: Current Every Day Smoker    Packs/day: 0.50     Years: 15.00    Pack years: 7.50    Types: Cigarettes   Smokeless tobacco: Never Used  Scientific laboratory technician Use: Never used  Substance Use Topics   Alcohol use: Yes    Alcohol/week: 18.0 standard drinks    Types: 18 Shots of liquor per week    Comment: Regular 2-3 scotch/day   Drug use: No      Review of Systems Constitutional: Positive fevers, generalized weakness Eyes: No visual changes. ENT: No sore throat. Cardiovascular: Denies chest pain. Respiratory: Positive shortness of breath  Gastrointestinal: Positive abdominal pain, not eating no nausea, no vomiting.  No diarrhea.  No constipation. Genitourinary: Negative for dysuria. Musculoskeletal: Negative for back pain. Skin: Negative for rash. Neurological: Negative for headaches, focal weakness or numbness. All other ROS negative ____________________________________________   PHYSICAL EXAM:  VITAL SIGNS: Blood pressure 129/87, pulse 76, temperature (!) 97.3 F (36.3 C), temperature source Oral, resp. rate 15, height 5\' 7"  (1.702 m), weight 73.2 kg, SpO2 90 %.  Constitutional: Alert and oriented.  Soft-spoken, difficult to sometimes hear Eyes: Conjunctivae are normal. EOMI. Head: Atraumatic. Nose: No congestion/rhinnorhea. Mouth/Throat: Mucous membranes are moist.   Neck: No stridor. Trachea Midline. FROM Cardiovascular: Normal rate, regular rhythm. Grossly normal heart sounds.  Good peripheral circulation. Respiratory: Normal respiratory effort.  No retractions. Lungs CTAB. Gastrointestinal: Soft but patient reports tenderness.  Colostomy bag intact.  Musculoskeletal: No lower extremity tenderness nor edema.  No joint effusions. Neurologic:  Normal speech and language. No gross focal neurologic deficits are appreciated.  Able to squeeze bilateral hands and pick up bilateral legs off the bed. Skin:  Skin is warm, dry and intact. No rash noted. Psychiatric: Mood and affect are normal. Speech and behavior are  normal. GU: Deferred   ____________________________________________   LABS (all labs ordered are listed, but only abnormal results are displayed)  Labs Reviewed  CULTURE, BLOOD (ROUTINE X 2)  CULTURE, BLOOD (ROUTINE X 2)  URINE CULTURE  RESP PANEL BY RT-PCR (FLU A&B, COVID) ARPGX2  CBC WITH DIFFERENTIAL/PLATELET  COMPREHENSIVE METABOLIC PANEL  LIPASE, BLOOD  URINALYSIS, COMPLETE (UACMP) WITH MICROSCOPIC  LACTIC ACID, PLASMA  LACTIC ACID, PLASMA  PROCALCITONIN  BRAIN NATRIURETIC PEPTIDE  ETHANOL  URINE DRUG SCREEN, QUALITATIVE ([facility] ONLY)  TROPONIN I (HIGH SENSITIVITY)   ____________________________________________   ED ECG REPORT I, Concha Se, the attending physician, personally viewed and interpreted this ECG.  Normal sinus rate of 77, no ST elevation, T wave inversion in lead V2, prolonged QTC of 477 ____________________________________________  RADIOLOGY Vela Prose, personally viewed and evaluated these images (plain radiographs) as part of my medical decision making, as well as reviewing the written report by the radiologist.  ED MD interpretation: Some patchiness bilaterally  Official radiology report(s): CT Head Wo Contrast  Result Date: 03/05/2021 CLINICAL DATA:  Mental status change. Increased shortness of breath and weakness for 3 days EXAM: CT HEAD WITHOUT CONTRAST TECHNIQUE: Contiguous axial images were obtained from the base of the skull through the vertex without intravenous contrast. COMPARISON:  11/14/2019 FINDINGS: Brain: No evidence of acute infarction, hemorrhage, hydrocephalus, extra-axial collection or mass lesion/mass effect. There is mild diffuse low-attenuation within the subcortical and periventricular white matter compatible with chronic microvascular disease. Prominence of the sulci and ventricles compatible with brain atrophy. Vascular: No hyperdense vessel or unexpected calcification. Skull: Normal. Negative for fracture or focal lesion.  Sinuses/Orbits: No acute finding. Other: None. IMPRESSION: 1. No acute intracranial abnormalities. 2. Chronic small vessel ischemic change and brain atrophy. Electronically Signed   By: Signa Kell M.D.   On: 03/05/2021 10:18   CT Angio Chest PE W and/or Wo Contrast  Result Date: 03/05/2021 CLINICAL DATA:  Shortness of breath and weakness over the last 3 days. Fever. Question pulmonary emboli. EXAM: CT ANGIOGRAPHY CHEST WITH CONTRAST TECHNIQUE: Multidetector CT imaging of the chest was performed using the standard protocol during bolus administration of intravenous contrast. Multiplanar CT image reconstructions and MIPs were obtained to evaluate the vascular anatomy. CONTRAST:  26mL OMNIPAQUE IOHEXOL 350 MG/ML SOLN COMPARISON:  Chest radiography same day.  CT 11/16/2020. FINDINGS: Cardiovascular: Heart size upper limits of normal. No pericardial fluid. Coronary artery calcification is present. Aortic atherosclerotic calcification is present. Pulmonary arterial opacification is good. There are no pulmonary emboli. Mediastinum/Nodes: Slight increase in prominence of mediastinal lymph nodes, possibly reactive to the pulmonary inflammatory disease described below. Lungs/Pleura: Some mucoid material in the right mainstem bronchus. Patient has developed widespread patchy pneumonia in both lungs since the previous exam. This is most notable in both lower lobes, in the lingula, an in the upper lobes right worse than left. No pleural effusion. Previously seen right upper lobe mass not well evaluated today because of regional pneumonia. Upper Abdomen: See results of abdominal CT. Musculoskeletal: Negative Review of the MIP images confirms the above findings. IMPRESSION: 1. No pulmonary emboli. 2. Widespread bilateral pneumonia, most notable in the lower lobes, lingula and upper lobes right worse than left. 3. Previously seen right upper lobe mass not well evaluated today because of regional pneumonia. 4. Aortic  atherosclerosis. Coronary artery calcification. Aortic Atherosclerosis (ICD10-I70.0). Electronically Signed   By: Nelson Chimes M.D.   On: 03/05/2021 10:11   CT ABDOMEN PELVIS W CONTRAST  Result Date: 03/05/2021 CLINICAL DATA:  Abdominal distension. EXAM: CT ABDOMEN AND PELVIS WITH CONTRAST TECHNIQUE: Multidetector CT imaging of the abdomen and pelvis was performed using the standard protocol following bolus administration of intravenous contrast. CONTRAST:  74mL OMNIPAQUE IOHEXOL 350 MG/ML SOLN COMPARISON:  February 19, 2021. FINDINGS: Lower chest: Mild bilateral posterior basilar subsegmental atelectasis or infiltrate is noted. Hepatobiliary: Minimal cholelithiasis is noted. No biliary dilatation is noted. The liver is unremarkable. Pancreas: Unremarkable. No pancreatic ductal dilatation or surrounding inflammatory changes. Spleen: Normal in size without focal abnormality. Adrenals/Urinary Tract: Adrenal glands appear normal. Right renal cyst is noted. No hydronephrosis or renal obstruction is noted. No renal or ureteral calculi are noted. Probable small calculus seen in the dependent portion of the urinary bladder suggesting passed stone. Stomach/Bowel: The stomach appears normal. The appendix appears normal. There is no evidence of bowel obstruction or inflammation. Status post resection of the sigmoid colon with colostomy seen in left lower quadrant. Vascular/Lymphatic: Aortic atherosclerosis. No enlarged abdominal or pelvic lymph nodes. Reproductive: Prostate is unremarkable. Other: No abdominal wall hernia or abnormality. No abdominopelvic ascites. Musculoskeletal: No acute or significant osseous findings. IMPRESSION: Minimal cholelithiasis. Mild bilateral posterior basilar subsegmental atelectasis or infiltrates. Probable small bladder calculus is noted. No hydronephrosis or renal obstruction is noted. Status post sigmoid colon resection with colostomy seen in left lower quadrant. Aortic Atherosclerosis  (ICD10-I70.0). Electronically Signed   By: Marijo Conception M.D.   On: 03/05/2021 10:20   DG Chest Portable 1 View  Result Date: 03/05/2021 CLINICAL DATA:  Increased shortness of breath weakness EXAM: PORTABLE CHEST 1 VIEW COMPARISON:  02/19/2021 FINDINGS: Increased interstitial prominence imposed on chronic changes. No pleural effusion or pneumothorax. Stable cardiomediastinal contours. IMPRESSION: Increased interstitial prominence which may reflect atypical/viral pneumonia or edema. Electronically Signed   By: Macy Mis M.D.   On: 03/05/2021 09:09    ____________________________________________   PROCEDURES  Procedure(s) performed (including Critical Care):  .1-3 Lead EKG Interpretation Performed by: Vanessa [place], MD Authorized by: Vanessa [place], MD     Interpretation: normal     ECG rate:  70s    ECG rate assessment: normal     Rhythm: sinus rhythm     Ectopy: none     Conduction: normal    .Critical Care Performed by: Jari Pigg,  Royetta Crochet, MD Authorized by: Vanessa [place], MD   Critical care provider statement:    Critical care time (minutes):  45   Critical care was necessary to treat or prevent imminent or life-threatening deterioration of the following conditions:  Respiratory failure   Critical care was time spent personally by me on the following activities:  Discussions with consultants, evaluation of patient's response to treatment, examination of patient, ordering and performing treatments and interventions, ordering and review of laboratory studies, ordering and review of radiographic studies, pulse oximetry, re-evaluation of patient's condition, obtaining history from patient or surrogate and review of old charts     ____________________________________________   INITIAL IMPRESSION / ASSESSMENT AND PLAN / ED COURSE  Stephen [name] was evaluated in Emergency Department on 03/05/2021 for the symptoms described in the history of present illness. He was evaluated in  the context of the global COVID-19 pandemic, which necessitated consideration that the patient might be at risk for infection with the SARS-CoV-2 virus that causes COVID-19. Institutional protocols and algorithms that pertain to the evaluation of patients at risk for COVID-19 are in a state of rapid change based on information released by regulatory bodies including the CDC and federal and state organizations. These policies and algorithms were followed during the patient's care in the ED.    Patient is a 75 year old who comes in with shortness of breath, weakness, abdominal pain.  Differential includes dehydration, AKI, electrolyte abnormalities, worsening pneumonia.  We will start off with labs and x-rays.  We will keep patient on the cardiac monitor due to his shortness of breath.  Patient placed on his baseline 3 L of oxygen but desatted down to 86% so he moved up to 5 L.   Chest x-ray shows some patchiness bilaterally.  Given patient's recent admission will get CT PE to further evaluate for pneumonia, pulmonary embolism and will get BNP to evaluate for heart failure and will CT abdomen given keep does report abdominal pain to make sure no evidence of recurrent obstruction.  Wife is not at bedside who states that he has had some intermittent confusion as well even though has not had any falls we will get CT head to make sure no evidence of hydrocephalus or intracranial hemorrhage.  CT head is negative  CT abdomen shows minimal cholelithiasis and a small bladder calculus with no evidence of hydronephrosis.  CT scan that was not concerning for a bilateral pneumonia in the area that was of pneumonia concern last time is not as well evaluated secondary to now being on both sides.  Patient's white count is trending back up and his procalcitonin is slightly elevated.  I did have to move him up to 5 L of oxygen.  Given patient's worsening CT imaging discussed the hospital team for readmission in case  patient has a worsening hospital-acquired pneumonia versus aspiration pneumonia.  Patient's white count is elevated but he otherwise does not meet any other sepsis criteria therefore sepsis alert was not called but patient was started on broad-spectrum antibiotics and given a liter of fluid  Discussed with the hospital team for admission             ____________________________________________   FINAL CLINICAL IMPRESSION(S) / ED DIAGNOSES   Final diagnoses:  Acute respiratory failure with hypoxia ([place])  Healthcare-associated pneumonia      MEDICATIONS GIVEN DURING THIS VISIT:  Medications  vancomycin (VANCOCIN) IVPB 1000 mg/200 mL premix (1,000 mg Intravenous New Bag/Given 03/05/21 1104)  ceFEPIme (MAXIPIME) 2  g in sodium chloride 0.9 % 100 mL IVPB (2 g Intravenous New Bag/Given 03/05/21 1105)  albuterol (PROVENTIL) (2.5 MG/3ML) 0.083% nebulizer solution 2.5 mg (has no administration in time range)  dextromethorphan-guaiFENesin (MUCINEX DM) 30-600 MG per 12 hr tablet 1 tablet (has no administration in time range)  nicotine (NICODERM CQ - dosed in mg/24 hours) patch 21 mg (has no administration in time range)  LORazepam (ATIVAN) tablet 1-4 mg (has no administration in time range)    Or  LORazepam (ATIVAN) injection 1-4 mg (has no administration in time range)  thiamine tablet 100 mg (has no administration in time range)    Or  thiamine (B-1) injection 100 mg (has no administration in time range)  folic acid (FOLVITE) tablet 1 mg (has no administration in time range)  multivitamin with minerals tablet 1 tablet (has no administration in time range)  LORazepam (ATIVAN) injection 0-4 mg (has no administration in time range)    Followed by  LORazepam (ATIVAN) injection 0-4 mg (has no administration in time range)  ondansetron (ZOFRAN) injection 4 mg (has no administration in time range)  acetaminophen (TYLENOL) tablet 650 mg (has no administration in time range)   acetaminophen (TYLENOL) suppository 650 mg (has no administration in time range)  hydrALAZINE (APRESOLINE) injection 5 mg (has no administration in time range)  metroNIDAZOLE (FLAGYL) IVPB 500 mg (has no administration in time range)  iohexol (OMNIPAQUE) 350 MG/ML injection 75 mL (75 mLs Intravenous Contrast Given 03/05/21 0935)  sodium chloride 0.9 % bolus 1,000 mL (1,000 mLs Intravenous New Bag/Given 03/05/21 1103)     ED Discharge Orders     None        Note:  This document was prepared using Dragon voice recognition software and may include unintentional dictation errors.   Vanessa [place], MD 03/05/21 1118    Vanessa [place], MD 03/05/21 1133    Vanessa [place], MD 09/14/21 [phone]

## 2021-03-05 NOTE — Progress Notes (Signed)
CODE SEPSIS - PHARMACY COMMUNICATION  **Broad Spectrum Antibiotics should be administered within 1 hour of Sepsis diagnosis**  Time Code Sepsis Called/Page Received: 1053  Antibiotics Ordered: Cefepime / vancomycin / metronidazole  Time of 1st antibiotic administration: 1104 (vancomycin), 1105 (cefepime)  Additional action taken by pharmacy: N/A  Benita Gutter 03/05/2021  11:30 AM

## 2021-03-05 NOTE — H&P (Addendum)
History and Physical    Stephen [name] TZG:[id number] DOB: Nov 25, 1945 DOA: 03/05/2021  Referring MD/NP/PA:   PCP: Stephen Jude, MD   Patient coming from:  The patient is coming from home.  At baseline, pt is independent for most of ADL.        Chief Complaint: SOB  HPI: Stephen [name] is a 75 y.o. 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  Patient was recently hospitalized from 4/8-8-02/2013 due to partial small bowel obstruction, aspiration pneumonia pneumonia and UTI. Pt did not require surgical treatment for small bowel obstruction.  He was discharged on 3 L oxygen.  Pt states in the past several days, he developed worsening shortness of breath which has been progressively worsening.  He has cough with little mucus production.  Denies chest pain. He had fever of 105 last night, took Advil today.  Temperature 97.3 in ED. Patient has generalized weakness, poor appetite and decreased oral intake.  He denies nausea, diarrhea, vomiting, abdominal pain.  He states he has some burning on urination, no dysuria or hematuria. Patient was found to have oxygen desaturation to 85% on home 3 L oxygen, currently 90-100% on 5 L oxygen.  ED Course: pt was found to have WBC 18.3, lactic acid 0.9, troponin level 8, negative COVID PCR, alcohol level less than 10, electrolytes renal function okay, temperature 97.3, blood pressure 129/87, heart rate 76, RR 18, chest x-ray showed increased interstitial prominence.  CT head is negative for acute intracranial abnormalities.  CT angiogram of chest is negative for PE, but showed bilateral infiltration, worse in lower lobes.  Patient is admitted to progressive bed as inpatient.  CT-abd/pelvis   Minimal cholelithiasis.  Mild bilateral posterior basilar subsegmental atelectasis or  infiltrates.  Probable small bladder calculus is noted. No hydronephrosis or renal obstruction is noted.  Status post sigmoid colon resection with colostomy seen in left lower quadrant.  Aortic Atherosclerosis (ICD10-I70.0).   Review of Systems:   General: Had fever at home, no chills, no body weight gain, has poor appetite, has fatigue HEENT: no blurry vision, hearing changes or sore throat Respiratory: has dyspnea, coughing, no wheezing CV: no chest pain, no palpitations GI: no nausea, vomiting, abdominal pain, diarrhea, constipation GU: no dysuria, burning on urination, increased urinary frequency, hematuria  Ext: no leg edema Neuro: no unilateral weakness, numbness, or tingling, no vision change or hearing loss Skin: no rash, no skin tear. MSK: No muscle spasm, no deformity, no limitation of range of movement in spin Heme: No easy bruising.  Travel history: No recent long distant travel.  Allergy:  Allergies  Allergen Reactions  . No Known Allergies     Past Medical History:  Diagnosis Date  . Allergic rhinitis   . Blood clot in vein 2015   left leg   . History of anemia   . History of chicken pox   . HTN (hypertension)   . Neuropathy    arms and legs - S/P Cancer tx  . Rectal cancer metastasized to lung [facility]) 09-2013   surgery and chemo  . Smoker     Past Surgical History:  Procedure Laterality Date  . COLON RESECTION  2015  . COLONOSCOPY  2015  . EUS N/A 11/15/2013   Procedure: LOWER ENDOSCOPIC ULTRASOUND (EUS);  Surgeon: Milus Banister, MD;  Location: [facility];  Service: Endoscopy;  Laterality: N/A;  . LUNG BIOPSY    . LUNG LOBECTOMY Left   . lung removed  2015   left lower   . PLANTAR FASCIA RELEASE Right 09/20/2017   Procedure: PLANTAR FASCIA [id number], excision plantar fibroma right foot;  Surgeon: Samara Deist, DPM;  Location: [facility];  Service: Podiatry;  Laterality: Right;  . TONSILLECTOMY AND ADENOIDECTOMY  Childhood  .  VARICOSE VEIN SURGERY  2010    Social History:  reports that he has been smoking cigarettes. He has a 7.50 pack-year smoking history. He has never used smokeless tobacco. He reports current alcohol use of about 18.0 standard drinks of alcohol per week. He reports that he does not use drugs.  Family History:  Family History  Problem Relation Age of Onset  . Hypertension Mother   . Coronary artery disease Mother        angina  . Cancer Mother        breast  . Cancer Father 30       esophageal  . COPD Brother        emphysema  . Cancer Paternal Grandmother        colon  . Diabetes Neg Hx      Prior to Admission medications   Medication Sig Start Date End Date Taking? Authorizing Provider  aspirin EC 81 MG tablet Take 81 mg by mouth daily.    [provider]  bisacodyl (DULCOLAX) 5 MG EC tablet Take 5 mg by mouth daily as needed for moderate constipation.    [provider]  esomeprazole (NEXIUM) 20 MG packet Take 20 mg by mouth daily before breakfast.    [provider]  folic acid (FOLVITE) 1 MG tablet Take 1 tablet (1 mg total) by mouth daily. 02/25/21 04/26/21  Bonnielee Haff, MD  gabapentin (NEURONTIN) 100 MG capsule Take 2 capsules (200 mg total) by mouth 2 (two) times daily. 02/24/21 04/25/21  Bonnielee Haff, MD  loratadine (CLARITIN) 10 MG tablet Take 10 mg by mouth daily as needed.    [provider]  Multiple Vitamin (MULTIVITAMIN WITH MINERALS) TABS tablet Take 1 tablet by mouth daily. 02/25/21   Bonnielee Haff, MD  nicotine (NICODERM CQ - DOSED IN MG/24 HOURS) 14 mg/24hr patch Place 1 patch (14 mg total) onto the skin daily. 02/25/21 03/27/21  Bonnielee Haff, MD  olmesartan (BENICAR) 20 MG tablet Take 20 mg by mouth daily.    [provider]  oxyCODONE-acetaminophen (PERCOCET) 7.5-325 MG tablet Take 1 tablet by mouth every 6 (six) hours as needed for severe pain. 06/24/20   Sable Feil, PA-C  polyethylene glycol (MIRALAX /  GLYCOLAX) 17 g packet Take 17 g by mouth daily. 02/24/21 03/26/21  Bonnielee Haff, MD  sertraline (ZOLOFT) 25 MG tablet Take 25 mg by mouth daily. 12/19/20   [provider]  Thiamine HCl (VITAMIN B-1) 250 MG tablet Take 1 tablet (250 mg total) by mouth daily. 02/24/21 04/25/21  Bonnielee Haff, MD    Physical Exam: Vitals:   03/05/21 [id number] 03/05/21 0926 03/05/21 1140 03/05/21 1200  BP: 126/73 129/87 126/66 122/63  Pulse: 74 76 78 76  Resp: 18 15 18 19   Temp: (!) 97.3 F (36.3 C)  97.7 F (36.5 C) 97.9 F (36.6 C)  TempSrc: Oral  Oral Oral  SpO2: 97% 90% 90% 95%  Weight:      Height:       General: Not in acute distress HEENT:  Eyes: PERRL, EOMI, no scleral icterus.       ENT: No discharge from the ears and nose, no pharynx injection, no tonsillar enlargement.        Neck: No JVD, no bruit, no mass felt. Heme: No neck lymph node enlargement. Cardiac: S1/S2, RRR, No murmurs, No gallops or rubs. Respiratory: Has fine crackles bilaterally GI: Soft, nondistended, nontender, no rebound pain, no organomegaly, BS present.  S/p of colostomy GU: No hematuria Ext: No pitting leg edema bilaterally. 1+DP/PT pulse bilaterally. Musculoskeletal: No joint deformities, No joint redness or warmth, no limitation of ROM in spin. Skin: No rashes.  Neuro: Alert, oriented X3, cranial nerves II-XII grossly intact, moves all extremities normally Psych: Patient is not psychotic, no suicidal or hemocidal ideation.  Labs on Admission: I have personally reviewed following labs and imaging studies  CBC: Recent Labs  Lab 03/05/21 0810  WBC 18.3*  NEUTROABS 14.9*  HGB 12.5*  HCT 37.4*  MCV 103.0*  PLT [id number]   Basic Metabolic Panel: Recent Labs  Lab 03/05/21 0810  NA 140  K 3.7  CL 103  CO2 28  GLUCOSE 88  BUN 19  CREATININE 0.80  CALCIUM 9.0   GFR: Estimated Creatinine Clearance: 75.7 mL/min (by C-G formula based on SCr of 0.8 mg/dL). Liver Function Tests: Recent Labs  Lab  03/05/21 0810  AST 39  ALT 25  ALKPHOS 116  BILITOT 0.8  PROT 7.5  ALBUMIN 2.4*   Recent Labs  Lab 03/05/21 0810  LIPASE 22   No results for input(s): AMMONIA in the last 168 hours. Coagulation Profile: No results for input(s): INR, PROTIME in the last 168 hours. Cardiac Enzymes: No results for input(s): CKTOTAL, CKMB, CKMBINDEX, TROPONINI in the last 168 hours. BNP (last 3 results) No results for input(s): PROBNP in the last 8760 hours. HbA1C: No results for input(s): HGBA1C in the last 72 hours. CBG: No results for input(s): GLUCAP in the last 168 hours. Lipid Profile: No results for input(s): CHOL, HDL, LDLCALC, TRIG, CHOLHDL, LDLDIRECT in the last 72 hours. Thyroid Function Tests: No results for input(s): TSH, T4TOTAL, FREET4, T3FREE, THYROIDAB in the last 72 hours. Anemia Panel: No results for input(s): VITAMINB12, FOLATE, FERRITIN, TIBC, IRON, RETICCTPCT in the last 72 hours. Urine analysis:    Component Value Date/Time   COLORURINE AMBER (A) 03/05/2021 0810   APPEARANCEUR CLOUDY (A) 03/05/2021 0810   APPEARANCEUR Cloudy 04/28/2014 0900   LABSPEC 1.039 (H) 03/05/2021 0810   LABSPEC 1.023 04/28/2014 0900   PHURINE 5.0 03/05/2021 0810   GLUCOSEU NEGATIVE 03/05/2021 0810   GLUCOSEU Negative 04/28/2014 0900   HGBUR NEGATIVE 03/05/2021 0810   BILIRUBINUR NEGATIVE 03/05/2021 0810   BILIRUBINUR Negative 04/28/2014 0900   KETONESUR 5 (A) 03/05/2021 0810   PROTEINUR NEGATIVE 03/05/2021 0810   NITRITE NEGATIVE 03/05/2021 0810   LEUKOCYTESUR TRACE (A) 03/05/2021 0810   LEUKOCYTESUR Negative 04/28/2014 0900   Sepsis Labs: @LABRCNTIP (procalcitonin:4,lacticidven:4) ) Recent Results (from the past 240 hour(s))  Resp Panel by RT-PCR (Flu A&B, Covid) Nasopharyngeal Swab     Status: None   Collection Time: 02/24/21 12:46 PM   Specimen: Nasopharyngeal Swab; Nasopharyngeal(NP) swabs in vial transport medium  Result Value Ref Range Status   SARS Coronavirus 2 by RT PCR  NEGATIVE NEGATIVE Final    Comment: (NOTE) SARS-CoV-2 target nucleic acids are NOT DETECTED.  The SARS-CoV-2 RNA is generally detectable in upper respiratory specimens during the acute phase of infection. The lowest concentration of SARS-CoV-2 viral copies this assay can  detect is 138 copies/mL. A negative result does not preclude SARS-Cov-2 infection and should not be used as the sole basis for treatment or other patient management decisions. A negative result may occur with  improper specimen collection/handling, submission of specimen other than nasopharyngeal swab, presence of viral mutation(s) within the areas targeted by this assay, and inadequate number of viral copies(<138 copies/mL). A negative result must be combined with clinical observations, patient history, and epidemiological information. The expected result is Negative.  Fact Sheet for Patients:  EntrepreneurPulse.com.au  Fact Sheet for Healthcare Providers:  IncredibleEmployment.be  This test is no t yet approved or cleared by the [place] FDA and  has been authorized for detection and/or diagnosis of SARS-CoV-2 by FDA under an Emergency Use Authorization (EUA). This EUA will remain  in effect (meaning this test can be used) for the duration of the COVID-19 declaration under Section 564(b)(1) of the Act, 21 U.S.C.section 360bbb-3(b)(1), unless the authorization is terminated  or revoked sooner.       Influenza A by PCR NEGATIVE NEGATIVE Final   Influenza B by PCR NEGATIVE NEGATIVE Final    Comment: (NOTE) The Xpert Xpress SARS-CoV-2/FLU/RSV plus assay is intended as an aid in the diagnosis of influenza from Nasopharyngeal swab specimens and should not be used as a sole basis for treatment. Nasal washings and aspirates are unacceptable for Xpert Xpress SARS-CoV-2/FLU/RSV testing.  Fact Sheet for Patients: EntrepreneurPulse.com.au  Fact Sheet for  Healthcare Providers: IncredibleEmployment.be  This test is not yet approved or cleared by the [place] FDA and has been authorized for detection and/or diagnosis of SARS-CoV-2 by FDA under an Emergency Use Authorization (EUA). This EUA will remain in effect (meaning this test can be used) for the duration of the COVID-19 declaration under Section 564(b)(1) of the Act, 21 U.S.C. section 360bbb-3(b)(1), unless the authorization is terminated or revoked.  Performed at [facility], [address] [place], [place] [address]   Resp Panel by RT-PCR (Flu A&B, Covid) Nasopharyngeal Swab     Status: None   Collection Time: 03/05/21  8:22 AM   Specimen: Nasopharyngeal Swab; Nasopharyngeal(NP) swabs in vial transport medium  Result Value Ref Range Status   SARS Coronavirus 2 by RT PCR NEGATIVE NEGATIVE Final    Comment: (NOTE) SARS-CoV-2 target nucleic acids are NOT DETECTED.  The SARS-CoV-2 RNA is generally detectable in upper respiratory specimens during the acute phase of infection. The lowest concentration of SARS-CoV-2 viral copies this assay can detect is 138 copies/mL. A negative result does not preclude SARS-Cov-2 infection and should not be used as the sole basis for treatment or other patient management decisions. A negative result may occur with  improper specimen collection/handling, submission of specimen other than nasopharyngeal swab, presence of viral mutation(s) within the areas targeted by this assay, and inadequate number of viral copies(<138 copies/mL). A negative result must be combined with clinical observations, patient history, and epidemiological information. The expected result is Negative.  Fact Sheet for Patients:  EntrepreneurPulse.com.au  Fact Sheet for Healthcare Providers:  IncredibleEmployment.be  This test is no t yet approved or cleared by the [place] FDA and  has been  authorized for detection and/or diagnosis of SARS-CoV-2 by FDA under an Emergency Use Authorization (EUA). This EUA will remain  in effect (meaning this test can be used) for the duration of the COVID-19 declaration under Section 564(b)(1) of the Act, 21 U.S.C.section 360bbb-3(b)(1), unless the authorization is terminated  or revoked sooner.  Influenza A by PCR NEGATIVE NEGATIVE Final   Influenza B by PCR NEGATIVE NEGATIVE Final    Comment: (NOTE) The Xpert Xpress SARS-CoV-2/FLU/RSV plus assay is intended as an aid in the diagnosis of influenza from Nasopharyngeal swab specimens and should not be used as a sole basis for treatment. Nasal washings and aspirates are unacceptable for Xpert Xpress SARS-CoV-2/FLU/RSV testing.  Fact Sheet for Patients: EntrepreneurPulse.com.au  Fact Sheet for Healthcare Providers: IncredibleEmployment.be  This test is not yet approved or cleared by the [place] FDA and has been authorized for detection and/or diagnosis of SARS-CoV-2 by FDA under an Emergency Use Authorization (EUA). This EUA will remain in effect (meaning this test can be used) for the duration of the COVID-19 declaration under Section 564(b)(1) of the Act, 21 U.S.C. section 360bbb-3(b)(1), unless the authorization is terminated or revoked.  Performed at [facility], [address] [place],  [address]      Radiological Exams on Admission: CT Head Wo Contrast  Result Date: 03/05/2021 CLINICAL DATA:  Mental status change. Increased shortness of breath and weakness for 3 days EXAM: CT HEAD WITHOUT CONTRAST TECHNIQUE: Contiguous axial images were obtained from the base of the skull through the vertex without intravenous contrast. COMPARISON:  11/14/2019 FINDINGS: Brain: No evidence of acute infarction, hemorrhage, hydrocephalus, extra-axial collection or mass lesion/mass effect. There is mild diffuse low-attenuation within  the subcortical and periventricular white matter compatible with chronic microvascular disease. Prominence of the sulci and ventricles compatible with brain atrophy. Vascular: No hyperdense vessel or unexpected calcification. Skull: Normal. Negative for fracture or focal lesion. Sinuses/Orbits: No acute finding. Other: None. IMPRESSION: 1. No acute intracranial abnormalities. 2. Chronic small vessel ischemic change and brain atrophy. Electronically Signed   By: Kerby Moors M.D.   On: 03/05/2021 10:18   CT Angio Chest PE W and/or Wo Contrast  Result Date: 03/05/2021 CLINICAL DATA:  Shortness of breath and weakness over the last 3 days. Fever. Question pulmonary emboli. EXAM: CT ANGIOGRAPHY CHEST WITH CONTRAST TECHNIQUE: Multidetector CT imaging of the chest was performed using the standard protocol during bolus administration of intravenous contrast. Multiplanar CT image reconstructions and MIPs were obtained to evaluate the vascular anatomy. CONTRAST:  63mL OMNIPAQUE IOHEXOL 350 MG/ML SOLN COMPARISON:  Chest radiography same day.  CT 11/16/2020. FINDINGS: Cardiovascular: Heart size upper limits of normal. No pericardial fluid. Coronary artery calcification is present. Aortic atherosclerotic calcification is present. Pulmonary arterial opacification is good. There are no pulmonary emboli. Mediastinum/Nodes: Slight increase in prominence of mediastinal lymph nodes, possibly reactive to the pulmonary inflammatory disease described below. Lungs/Pleura: Some mucoid material in the right mainstem bronchus. Patient has developed widespread patchy pneumonia in both lungs since the previous exam. This is most notable in both lower lobes, in the lingula, an in the upper lobes right worse than left. No pleural effusion. Previously seen right upper lobe mass not well evaluated today because of regional pneumonia. Upper Abdomen: See results of abdominal CT. Musculoskeletal: Negative Review of the MIP images confirms the  above findings. IMPRESSION: 1. No pulmonary emboli. 2. Widespread bilateral pneumonia, most notable in the lower lobes, lingula and upper lobes right worse than left. 3. Previously seen right upper lobe mass not well evaluated today because of regional pneumonia. 4. Aortic atherosclerosis. Coronary artery calcification. Aortic Atherosclerosis (ICD10-I70.0). Electronically Signed   By: Nelson Chimes M.D.   On: 03/05/2021 10:11   CT ABDOMEN PELVIS W CONTRAST  Result Date: 03/05/2021 CLINICAL DATA:  Abdominal distension. EXAM: CT  ABDOMEN AND PELVIS WITH CONTRAST TECHNIQUE: Multidetector CT imaging of the abdomen and pelvis was performed using the standard protocol following bolus administration of intravenous contrast. CONTRAST:  42mL OMNIPAQUE IOHEXOL 350 MG/ML SOLN COMPARISON:  February 19, 2021. FINDINGS: Lower chest: Mild bilateral posterior basilar subsegmental atelectasis or infiltrate is noted. Hepatobiliary: Minimal cholelithiasis is noted. No biliary dilatation is noted. The liver is unremarkable. Pancreas: Unremarkable. No pancreatic ductal dilatation or surrounding inflammatory changes. Spleen: Normal in size without focal abnormality. Adrenals/Urinary Tract: Adrenal glands appear normal. Right renal cyst is noted. No hydronephrosis or renal obstruction is noted. No renal or ureteral calculi are noted. Probable small calculus seen in the dependent portion of the urinary bladder suggesting passed stone. Stomach/Bowel: The stomach appears normal. The appendix appears normal. There is no evidence of bowel obstruction or inflammation. Status post resection of the sigmoid colon with colostomy seen in left lower quadrant. Vascular/Lymphatic: Aortic atherosclerosis. No enlarged abdominal or pelvic lymph nodes. Reproductive: Prostate is unremarkable. Other: No abdominal wall hernia or abnormality. No abdominopelvic ascites. Musculoskeletal: No acute or significant osseous findings. IMPRESSION: Minimal cholelithiasis.  Mild bilateral posterior basilar subsegmental atelectasis or infiltrates. Probable small bladder calculus is noted. No hydronephrosis or renal obstruction is noted. Status post sigmoid colon resection with colostomy seen in left lower quadrant. Aortic Atherosclerosis (ICD10-I70.0). Electronically Signed   By: Marijo Conception M.D.   On: 03/05/2021 10:20   DG Chest Portable 1 View  Result Date: 03/05/2021 CLINICAL DATA:  Increased shortness of breath weakness EXAM: PORTABLE CHEST 1 VIEW COMPARISON:  02/19/2021 FINDINGS: Increased interstitial prominence imposed on chronic changes. No pleural effusion or pneumothorax. Stable cardiomediastinal contours. IMPRESSION: Increased interstitial prominence which may reflect atypical/viral pneumonia or edema. Electronically Signed   By: Macy Mis M.D.   On: 03/05/2021 09:09     EKG: I have personally reviewed.  Sinus rhythm, QTC 438, low voltage, poor R wave progression, LAD, T wave inversion in lead V3-V6 and inferior leads.  Assessment/Plan Principal Problem:   Aspiration pneumonia (HCC) Active Problems:   HTN (hypertension)   Smoker   Rectal cancer (HCC)   UTI (urinary tract infection)   GERD (gastroesophageal reflux disease)   Depression   Alcohol use   Acute on chronic respiratory failure with hypoxia (HCC)   Acute on chronic respiratory failure with hypoxia due to aspiration pneumonia [facility]): Patient's worsening shortness of breath is most likely due to aspiration pneumonia in the setting of alcohol abuse.  Patient also has history of oropharyngeal dysphagia.  We cannot completely rule out possibility of HCAP.  Patient reported fever of 105 at home, but current temperature 97.3.  Has leukocytosis with WBC 18.3, but does not meet criteria for sepsis currently.  Lactic acid is normal.  procalcitonin 0.13.  Hemodynamically stable.  - Will admit to med-surg bed as inpt - IV Vancomycin and cefepime, flagyl - Mucinex for cough  -  Bronchodilators - Urine legionella and S. pneumococcal antigen - Follow up blood culture x2, sputum culture - IVF: 1L of NS bolus in ED  HTN (hypertension): pt is not taking Benicar. Bp 127/70 now -IV hydralazine as needed  Smoker and alcohol use: -Nicotine patch -CIWA protocol  Hx of rectal CA: S/p of surgery with colostomy.  Patient finished chemotherapy 2016 -No acute issues  GERD (gastroesophageal reflux disease) -protonix  Depression: pt is not taking his Zoloft -observe closely  UTI: Urinalysis showed cloudy appearance, trace amount of leukocyte, many bacteria, WBC 25-50.  Patient has burning on  urination. -On broad antibiotics for pneumonia as above -Follow-up of blood culture and urine culture     DVT ppx: SQ Lovenox Code Status: Full code ( I discussed with patient, patient want to be full code) Family Communication:   Yes, patient's daughter by phone Disposition Plan:  Anticipate discharge back to previous environment Consults called:  none Admission status and Level of care: Progressive Cardiac: as inpt     Status is: Inpatient  Remains inpatient appropriate because:Inpatient level of care appropriate due to severity of illness   Dispo: The patient is from: Home              Anticipated d/c is to: Home              Patient currently is not medically stable to d/c.   Difficult to place patient No          Date of Service 03/05/2021    [facility]   If 7PM-7AM, please contact night-coverage [email or web address] 03/05/2021, 1:00 PM

## 2021-03-05 NOTE — Progress Notes (Signed)
Patient resting comfortably with eyes closed at this time; VSS. Lunch tray provided.

## 2021-03-05 NOTE — Consult Note (Signed)
Pharmacy Antibiotic Note  Stephen [name] is a 75 y.o. 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 Per chart review, patient was recently admitted 4/8 - 4/14 with aspiration pneumonia / partial SBO. Patient received treatment with Zosyn which was de-escalated to Augmentin.  Pharmacy has been consulted for vancomycin and cefepime dosing. Patient is also ordered metronidazole.   Today, 03/05/21  --On 3L [place] --Hypothermic, WBC 18.3, PCT 0.13 --CTA: "Widespread bilateral pneumonia, most notable in the lower lobes, lingula and upper lobes right worse than left"  Plan:  Cefepime 2 g IV q8h  Vancomycin 1.75 g IV LD (24 mg/kg) followed by maintenance regimen of vancomycin 750 mg IV q12h --Calculated AUC: 423, Cmin 12.3 --Levels at steady state as indicated --Daily Scr per protocol while on IV vancomycin  Height: 5\' 7"  (170.2 cm) Weight: 73.2 kg (161 lb 6 oz) IBW/kg (Calculated) : 66.1  Temp (24hrs), Avg:97.3 F (36.3 C), Min:97.3 F (36.3 C), Max:97.3 F (36.3 C)  Recent Labs  Lab 03/05/21 0810  WBC 18.3*  CREATININE 0.80  LATICACIDVEN 0.8  0.9    Estimated Creatinine Clearance: 75.7 mL/min (by C-G formula based on SCr of 0.8 mg/dL).    Allergies  Allergen Reactions  . No Known Allergies     Antimicrobials this admission: Cefepime 4/22 >>  Vancomycin 4/22 >>  Metronidazole 4/22 >>   Dose adjustments this admission: N/A  Microbiology results: 4/22 Legionella urinary Ag: pending 4/22 Strep pneumo urinary Ag: pending 4/22 BCx: pending 4/22 UCx: pending  4/22 MRSA PCR: pending  Thank you for allowing pharmacy to be a part of this patient's care.  Benita Gutter 03/05/2021 11:32 AM

## 2021-03-06 DIAGNOSIS — J9621 Acute and chronic respiratory failure with hypoxia: Secondary | ICD-10-CM | POA: Diagnosis not present

## 2021-03-06 DIAGNOSIS — I10 Essential (primary) hypertension: Secondary | ICD-10-CM

## 2021-03-06 DIAGNOSIS — L8990 Pressure ulcer of unspecified site, unspecified stage: Secondary | ICD-10-CM | POA: Insufficient documentation

## 2021-03-06 DIAGNOSIS — K219 Gastro-esophageal reflux disease without esophagitis: Secondary | ICD-10-CM | POA: Diagnosis not present

## 2021-03-06 DIAGNOSIS — J690 Pneumonitis due to inhalation of food and vomit: Principal | ICD-10-CM

## 2021-03-06 DIAGNOSIS — L8940 Pressure ulcer of contiguous site of back, buttock and hip, unspecified stage: Secondary | ICD-10-CM

## 2021-03-06 LAB — STREP PNEUMONIAE URINARY ANTIGEN: Strep Pneumo Urinary Antigen: NEGATIVE

## 2021-03-06 LAB — CBC
HCT: 34.1 % — ABNORMAL LOW (ref 39.0–52.0)
Hemoglobin: 11.3 g/dL — ABNORMAL LOW (ref 13.0–17.0)
MCH: 34.5 pg — ABNORMAL HIGH (ref 26.0–34.0)
MCHC: 33.1 g/dL (ref 30.0–36.0)
MCV: 104 fL — ABNORMAL HIGH (ref 80.0–100.0)
Platelets: 296 10*3/uL (ref 150–400)
RBC: 3.28 MIL/uL — ABNORMAL LOW (ref 4.22–5.81)
RDW: 13.6 % (ref 11.5–15.5)
WBC: 14.8 10*3/uL — ABNORMAL HIGH (ref 4.0–10.5)
nRBC: 0 % (ref 0.0–0.2)

## 2021-03-06 LAB — BASIC METABOLIC PANEL
Anion gap: 13 (ref 5–15)
BUN: 17 mg/dL (ref 8–23)
CO2: 23 mmol/L (ref 22–32)
Calcium: 8.7 mg/dL — ABNORMAL LOW (ref 8.9–10.3)
Chloride: 104 mmol/L (ref 98–111)
Creatinine, Ser: 0.7 mg/dL (ref 0.61–1.24)
GFR, Estimated: >60 mL/min (ref >60)
Glucose, Bld: 75 mg/dL (ref 70–99)
Potassium: 3.4 mmol/L — ABNORMAL LOW (ref 3.5–5.1)
Sodium: 140 mmol/L (ref 135–145)

## 2021-03-06 LAB — URINE CULTURE: Culture: <10000 — AB

## 2021-03-06 MED ORDER — LIDOCAINE 5 % EX PTCH
1.0000 | MEDICATED_PATCH | CUTANEOUS | Status: DC
  Administered 2021-03-06 – 2021-03-07 (×2): 1 via TRANSDERMAL
  Filled 2021-03-06 (×3): qty 1

## 2021-03-06 MED ORDER — MAGIC MOUTHWASH W/LIDOCAINE
5.0000 mL | Freq: Four times a day (QID) | ORAL | Status: DC
  Administered 2021-03-06 – 2021-03-08 (×8): 5 mL via ORAL
  Filled 2021-03-06 (×11): qty 5

## 2021-03-06 MED ORDER — SODIUM CHLORIDE 0.9 % IV SOLN
3.0000 g | Freq: Four times a day (QID) | INTRAVENOUS | Status: DC
  Administered 2021-03-06 – 2021-03-08 (×7): 3 g via INTRAVENOUS
  Filled 2021-03-06: qty 3
  Filled 2021-03-06 (×5): qty 8
  Filled 2021-03-06 (×4): qty 3
  Filled 2021-03-06: qty 8

## 2021-03-06 MED ORDER — DICLOFENAC SODIUM 1 % EX GEL
2.0000 g | Freq: Two times a day (BID) | CUTANEOUS | Status: DC | PRN
  Filled 2021-03-06: qty 100

## 2021-03-06 NOTE — Progress Notes (Signed)
[facility] [place] NAME: Stephen [name]    MR#:  [id number]  DATE OF BIRTH:  1946-04-07  SUBJECTIVE:  came in with fever respiratory distress and hypoxia. Chronically on 3 L nasal cannula oxygen. Complains of wrong mouth. According to the wife patient has not been compliant with honey thick/thick liquid. He drinks clear water and has chronic cough.  REVIEW OF SYSTEMS:   Review of Systems  Constitutional: Negative for chills, fever and weight loss.  HENT: Negative for ear discharge, ear pain and nosebleeds.   Eyes: Negative for blurred vision, pain and discharge.  Respiratory: Positive for cough, sputum production and shortness of breath. Negative for wheezing and stridor.   Cardiovascular: Negative for chest pain, palpitations, orthopnea and PND.  Gastrointestinal: Negative for abdominal pain, diarrhea, nausea and vomiting.  Genitourinary: Negative for frequency and urgency.  Musculoskeletal: Negative for back pain and joint pain.  Neurological: Positive for weakness. Negative for sensory change, speech change and focal weakness.  Psychiatric/Behavioral: Negative for depression and hallucinations. The patient is not nervous/anxious.    Tolerating Diet: Tolerating PT: bedbound for last two months  DRUG ALLERGIES:   Allergies  Allergen Reactions  . No Known Allergies     VITALS:  Blood pressure 104/82, pulse 77, temperature 98.9 F (37.2 C), temperature source Oral, resp. rate 15, height 5\' 7"  (1.702 m), weight 73.2 kg, SpO2 94 %.  PHYSICAL EXAMINATION:   Physical Exam  GENERAL:  75 y.o.-year-old patient lying in the bed with no acute distress. Appears chronically ill HEENT: Head atraumatic, normocephalic. Oropharynx and nasopharynx clear. Oral mucosa dry. Thrush and ulcers present  LUNGS: decreased breath sounds bilaterally, no wheezing, rales, rhonchi. No use of accessory muscles of respiration.  CARDIOVASCULAR: S1, S2 normal.  No murmurs, rubs, or gallops.  ABDOMEN: Soft, nontender, nondistended. Bowel sounds present. No organomegaly or mass.  EXTREMITIES: No cyanosis, clubbing or edema b/l.    NEUROLOGIC: overall debilitated. Generalized weakness PSYCHIATRIC:  patient is alert and oriented x 3.  SKIN:  Pressure Injury 03/05/21 Coccyx Medial Stage 2 -  Partial thickness loss of dermis presenting as a shallow open injury with a red, pink wound bed without slough. stage 2 pressure injury surrounded by DTI on coxxyx/sacrum (Active)  03/05/21 1639  Location: Coccyx  Location Orientation: Medial  Staging: Stage 2 -  Partial thickness loss of dermis presenting as a shallow open injury with a red, pink wound bed without slough.  Wound Description (Comments): stage 2 pressure injury surrounded by DTI on coxxyx/sacrum  Present on Admission: Yes        LABORATORY PANEL:  CBC Recent Labs  Lab 03/06/21 0320  WBC 14.8*  HGB 11.3*  HCT 34.1*  PLT 296    Chemistries  Recent Labs  Lab 03/05/21 0810 03/06/21 0320  NA 140 140  K 3.7 3.4*  CL 103 104  CO2 28 23  GLUCOSE 88 75  BUN 19 17  CREATININE 0.80 0.70  CALCIUM 9.0 8.7*  AST 39  --   ALT 25  --   ALKPHOS 116  --   BILITOT 0.8  --    Cardiac Enzymes No results for input(s): TROPONINI in the last 168 hours. RADIOLOGY:  CT Head Wo Contrast  Result Date: 03/05/2021 CLINICAL DATA:  Mental status change. Increased shortness of breath and weakness for 3 days EXAM: CT HEAD WITHOUT CONTRAST TECHNIQUE: Contiguous axial images were obtained from the base of the skull through the  vertex without intravenous contrast. COMPARISON:  11/14/2019 FINDINGS: Brain: No evidence of acute infarction, hemorrhage, hydrocephalus, extra-axial collection or mass lesion/mass effect. There is mild diffuse low-attenuation within the subcortical and periventricular white matter compatible with chronic microvascular disease. Prominence of the sulci and ventricles compatible with  brain atrophy. Vascular: No hyperdense vessel or unexpected calcification. Skull: Normal. Negative for fracture or focal lesion. Sinuses/Orbits: No acute finding. Other: None. IMPRESSION: 1. No acute intracranial abnormalities. 2. Chronic small vessel ischemic change and brain atrophy. Electronically Signed   By: Kerby Moors M.D.   On: 03/05/2021 10:18   CT Angio Chest PE W and/or Wo Contrast  Result Date: 03/05/2021 CLINICAL DATA:  Shortness of breath and weakness over the last 3 days. Fever. Question pulmonary emboli. EXAM: CT ANGIOGRAPHY CHEST WITH CONTRAST TECHNIQUE: Multidetector CT imaging of the chest was performed using the standard protocol during bolus administration of intravenous contrast. Multiplanar CT image reconstructions and MIPs were obtained to evaluate the vascular anatomy. CONTRAST:  35mL OMNIPAQUE IOHEXOL 350 MG/ML SOLN COMPARISON:  Chest radiography same day.  CT 11/16/2020. FINDINGS: Cardiovascular: Heart size upper limits of normal. No pericardial fluid. Coronary artery calcification is present. Aortic atherosclerotic calcification is present. Pulmonary arterial opacification is good. There are no pulmonary emboli. Mediastinum/Nodes: Slight increase in prominence of mediastinal lymph nodes, possibly reactive to the pulmonary inflammatory disease described below. Lungs/Pleura: Some mucoid material in the right mainstem bronchus. Patient has developed widespread patchy pneumonia in both lungs since the previous exam. This is most notable in both lower lobes, in the lingula, an in the upper lobes right worse than left. No pleural effusion. Previously seen right upper lobe mass not well evaluated today because of regional pneumonia. Upper Abdomen: See results of abdominal CT. Musculoskeletal: Negative Review of the MIP images confirms the above findings. IMPRESSION: 1. No pulmonary emboli. 2. Widespread bilateral pneumonia, most notable in the lower lobes, lingula and upper lobes right  worse than left. 3. Previously seen right upper lobe mass not well evaluated today because of regional pneumonia. 4. Aortic atherosclerosis. Coronary artery calcification. Aortic Atherosclerosis (ICD10-I70.0). Electronically Signed   By: Nelson Chimes M.D.   On: 03/05/2021 10:11   CT ABDOMEN PELVIS W CONTRAST  Result Date: 03/05/2021 CLINICAL DATA:  Abdominal distension. EXAM: CT ABDOMEN AND PELVIS WITH CONTRAST TECHNIQUE: Multidetector CT imaging of the abdomen and pelvis was performed using the standard protocol following bolus administration of intravenous contrast. CONTRAST:  24mL OMNIPAQUE IOHEXOL 350 MG/ML SOLN COMPARISON:  February 19, 2021. FINDINGS: Lower chest: Mild bilateral posterior basilar subsegmental atelectasis or infiltrate is noted. Hepatobiliary: Minimal cholelithiasis is noted. No biliary dilatation is noted. The liver is unremarkable. Pancreas: Unremarkable. No pancreatic ductal dilatation or surrounding inflammatory changes. Spleen: Normal in size without focal abnormality. Adrenals/Urinary Tract: Adrenal glands appear normal. Right renal cyst is noted. No hydronephrosis or renal obstruction is noted. No renal or ureteral calculi are noted. Probable small calculus seen in the dependent portion of the urinary bladder suggesting passed stone. Stomach/Bowel: The stomach appears normal. The appendix appears normal. There is no evidence of bowel obstruction or inflammation. Status post resection of the sigmoid colon with colostomy seen in left lower quadrant. Vascular/Lymphatic: Aortic atherosclerosis. No enlarged abdominal or pelvic lymph nodes. Reproductive: Prostate is unremarkable. Other: No abdominal wall hernia or abnormality. No abdominopelvic ascites. Musculoskeletal: No acute or significant osseous findings. IMPRESSION: Minimal cholelithiasis. Mild bilateral posterior basilar subsegmental atelectasis or infiltrates. Probable small bladder calculus is noted. No hydronephrosis or renal  obstruction is noted. Status post sigmoid colon resection with colostomy seen in left lower quadrant. Aortic Atherosclerosis (ICD10-I70.0). Electronically Signed   By: Marijo Conception M.D.   On: 03/05/2021 10:20   DG Chest Portable 1 View  Result Date: 03/05/2021 CLINICAL DATA:  Increased shortness of breath weakness EXAM: PORTABLE CHEST 1 VIEW COMPARISON:  02/19/2021 FINDINGS: Increased interstitial prominence imposed on chronic changes. No pleural effusion or pneumothorax. Stable cardiomediastinal contours. IMPRESSION: Increased interstitial prominence which may reflect atypical/viral pneumonia or edema. Electronically Signed   By: Macy Mis M.D.   On: 03/05/2021 09:09   ASSESSMENT AND PLAN:   Sasuke Yaffe is a 75 y.o. male with medical history significant of rectal cancer metastasized to lung (s/p of colostomy, chemotherapy),HTN, GERD, depression, GERD,smoker, alcohol use, left leg blood clot not on anticoagulants, who presents with SOB.  Acute on chronic respiratory failure with hypoxia due to aspiration pneumonia [facility]): Patient's worsening shortness of breath is most likely due to aspiration pneumonia.  Patient also has history of oropharyngeal dysphagia.    Patient reported fever of 105 at home, but current temperature 97.3.  Has leukocytosis with WBC 18.3, but does not meet criteria for sepsis currently.  Lactic acid is normal.  procalcitonin 0.13.  Hemodynamically stable. -- patient hemodynamically stable. Continue IV unasyn -- blood culture negative -- seen by speech therapist. Patient has chronic aspiration noted on modified barium swallow done in early part of March. He is at a high risk for aspiration. Patient continues to drink clear water at home instead of honey thickened liquids. -- Discussed with patient's wife at bedside. She understands patient has poor prognosis and is at a risk for cardiorespiratory arrest and recurrent lung damage from aspiration. Discussed code status  and goals of care. Wife is agreeable with DNR DNI.  Patient already has been seen by palliative care in the past.  -HTN (hypertension): pt is not taking Benicar. Bp 127/70 now -IV hydralazine as needed  Smoker and alcohol use: -Nicotine patch -- according to the wife patient has not had alcohol to drink for last two weeks.  Hx of rectal CA: S/p of surgery with colostomy. Patient finished chemotherapy 2016 -No acute issues  GERD (gastroesophageal reflux disease) -protonix  Depression: pt is not taking his Zoloft -observe closely  UTI: Urinalysis showed cloudy appearance, trace amount of leukocyte, many bacteria, WBC 25-50.  Patient has burning on urination. -On broad antibiotics for pneumonia as above -Follow-up of blood culture negative and urine culture less than 10,000 and significant growth     DVT ppx: SQ Lovenox Code Status:DNR/DNI--d/w wife Family Communication:   Yes, patient's wife at bedside Disposition Plan:  Anticipate discharge back to previous environment Consults called:  none Admission status and Level of care: Progressive Cardiac: as inpt     Status is: Inpatient  Remains inpatient appropriate because:Inpatient level of care appropriate due to severity of illness   Dispo: The patient is from: Home  Anticipated d/c is to: Home  Patient currently is not medically stable to d/c.              Difficult to place patient No  Level of care: Med-Surg        TOTAL TIME TAKING CARE OF THIS PATIENT: *25* minutes.  >50% time spent on counselling and coordination of care  Note: This dictation was prepared with Dragon dictation along with smaller phrase technology. Any transcriptional errors that result from this process are unintentional.  Norville Haggard.D  [facility]   CC: Primary care physician; [name], MDPatient ID: Stephen [name], male   DOB: Jul 19, 1946, 75 y.o.   MRN: [id number]

## 2021-03-06 NOTE — Evaluation (Signed)
Clinical/Bedside Swallow Evaluation Patient Details  Name: Stephen [name] MRN: [id number] Date of Birth: 06-Apr-1946  Today's Date: 03/06/2021 Time: SLP Start Time (ACUTE ONLY): 0945 SLP Stop Time (ACUTE ONLY): 1045 SLP Time Calculation (min) (ACUTE ONLY): 60 min  Past Medical History:  Past Medical History:  Diagnosis Date  . Allergic rhinitis   . Blood clot in vein 2015   left leg   . History of anemia   . History of chicken pox   . HTN (hypertension)   . Neuropathy    arms and legs - S/P Cancer tx  . Rectal cancer metastasized to lung [facility]) 09-2013   surgery and chemo  . Smoker    Past Surgical History:  Past Surgical History:  Procedure Laterality Date  . COLON RESECTION  2015  . COLONOSCOPY  2015  . EUS N/A 11/15/2013   Procedure: LOWER ENDOSCOPIC ULTRASOUND (EUS);  Surgeon: Milus Banister, MD;  Location: [facility];  Service: Endoscopy;  Laterality: N/A;  . LUNG BIOPSY    . LUNG LOBECTOMY Left   . lung removed  2015   left lower   . PLANTAR FASCIA RELEASE Right 09/20/2017   Procedure: PLANTAR FASCIA [id number], excision plantar fibroma right foot;  Surgeon: Samara Deist, DPM;  Location: [place];  Service: Podiatry;  Laterality: Right;  . TONSILLECTOMY AND ADENOIDECTOMY  Childhood  . VARICOSE VEIN SURGERY  2010   HPI:  Pt is a 75 y.o. male with medical history significant of rectal Cancer metastasized to lung (s/p for colostomy, chemotherapy), HTn, GERD, depression, GERD, smoker, ETOH use, left leg blood clot not on anticoagulants, who presents with abdominal discomfort, weakness, and Acute on chronic respiratory failure with hypoxia due to aspiration pneumonia.  Per patient and his wife, patient has been generalized weakness for almost 2 weeks since his D/C from previous admit. Wife endorses pt has not been eating/drinking at home for ~1 week.  During previous admit ~2 weeks ago, CT of Abd.: "Partial small bowel obstruction without focal transition point.   2. Small volume of reactive ascites in the right pelvis in the  region of dilated and fluid-filled small bowel.  3. Bilateral lower lobe atelectasis versus small volume aspiration  superimposed on a background of diffuse chronic bronchial wall  thickening.  4. Mild circumferential thickening of the distal esophagus concerning for esophagitis.".  A MBSS was completed on 02/22/2021 w/ moderate oropharyngeal phase dysphagia, impacted by pt's Cognitive decline/status, resulting in Aspiration of thin liquids.  A Dysphagia diet was initiated then including Nectar consistency liquids which Wife stated he was "not" drinking regularly at home.   Assessment / Plan / Recommendation Clinical Impression  Pt presents w/ chronic h/o oropharyngeal phase dysphagia as per recent MBSS results (02/22/2021). Noted Neuromuscular impact; also suspect impact from declined Cognitive status(baseline chronic ETOH use/abuse). These issues can lead to aspiration of oral intake especially if attempting to drink thin liquids (Nectar liquids were recommended per recent MBSS d/t aspiration w/ thin liquids noted on exam - see full report in chart). Pt is also suffering from significant oral discomfort and erythema and ulcer-like mucosal breakdown -- NSG/MD made aware. Pt required min-mod tactile/verbal/visual cues for orientation to bolus presentation, follow through w/ tasks, and self-feeding support. He fed self minimally helping to hold Cup. Pt consumed trials of small TSPs of Nectar liquids w/ 2 trials via Cup, then purees mostly fed to him w/ no immediate, overt clinical s/s of aspiration noted w/ the trials except w/ larger Cup  sips; Nectar trials were then given via TSP only. No decline in respiratory presentation noted during/post trials, however congested coughing occurred w/ the Cup trials -- Suspect delayed pharyngeal swallowin initiation and aspiration w/ those trials. Oral phase was c/b slow, deliberate bolus management and oral  clearing of all boluses given. He required increased Time for A-P transfer w/ trials suspect d/t oral discomfort and overall weakness. Time b/t trials required for pt to fully clear his mouth w/ f/u dry swallows. OM exam was cursory d/t pt's discomfort -- generalized weakness suspected.  Recommend dysphagia level 1 diet(puree) w/ Nectar consistency liquids via TSP; aspiration precautions; Pills Crushed in puree for safety; feeding support and supervision at meals, reduce Distractions during meals and check ofr oral clearing during/post intake.  IF increased coughing noted, then hold PO's and inform MD. Discussed need for oral care treatment via Swabs d/t breakdown. Recommend Palliative Care consult fro [place] d/t pt's Chronic Aspiration and Poor oral intake overall; multiple Baseline Chronic medical issues. NSG/MD updated. Precautions posted. SLP Visit Diagnosis: Dysphagia, oropharyngeal phase (R13.12) (Cognitive decline)    Aspiration Risk  Moderate aspiration risk;Risk for inadequate nutrition/hydration    Diet Recommendation  Dysphagia level 1 (puree) w/ NECTAR consistency liquids VIA TPS; strict aspiration precautions and feeding support; Supervision w/ po's.  Stop giving po's if increased coughing noted.  Frequent oral care.  Medication Administration: Crushed with puree (for safe swallowing)    Other  Recommendations Recommended Consults:  (Palliative Care consult for [place]) Oral Care Recommendations: Oral care BID;Oral care before and after PO;Staff/trained caregiver to provide oral care Other Recommendations: Order thickener from pharmacy;Remove water pitcher;Prohibited food (jello, ice cream, thin soups);Have oral suction available   Follow up Recommendations  (TBD)      Frequency and Duration min 2x/week  1 week       Prognosis Prognosis for Safe Diet Advancement: Guarded Barriers to Reach Goals: Cognitive deficits;Time post onset;Severity of deficits;Behavior Barriers/Prognosis Comment:  unsure of baseline Cognitive status      Swallow Study   General Date of Onset: 03/05/21 HPI: Pt is a 75 y.o. male with medical history significant of rectal Cancer metastasized to lung (s/p for colostomy, chemotherapy), HTn, GERD, depression, GERD, smoker, ETOH use, left leg blood clot not on anticoagulants, who presents with abdominal discomfort, weakness, and Acute on chronic respiratory failure with hypoxia due to aspiration pneumonia.  Per patient and his wife, patient has been generalized weakness for almost 2 weeks since his D/C from previous admit. Wife endorses pt has not been eating/drinking at home for ~1 week.  During previous admit ~2 weeks ago, CT of Abd.: "Partial small bowel obstruction without focal transition point.  2. Small volume of reactive ascites in the right pelvis in the  region of dilated and fluid-filled small bowel.  3. Bilateral lower lobe atelectasis versus small volume aspiration  superimposed on a background of diffuse chronic bronchial wall  thickening.  4. Mild circumferential thickening of the distal esophagus concerning for esophagitis.".  A MBSS was completed on 02/22/2021 w/ moderate oropharyngeal phase dysphagia, impacted by pt's Cognitive decline/status, resulting in Aspiration of thin liquids.  A Dysphagia diet was initiated then including Nectar consistency liquids which Wife stated he was "not" drinking regularly at home. Type of Study: Bedside Swallow Evaluation Previous Swallow Assessment: BSE on 02/19/2021; MBSS on 02/22/2021 - oropharyngeal phase dysphagia w/ aspiration of thin liquids. Nectar liquids rec'd for the full liquid diet. Diet Prior to this Study: Nectar-thick liquids (full liquid per  MD/GI at previous d/c) Temperature Spikes Noted: No (wbc 14.8) Respiratory Status: Nasal cannula (3-5L) History of Recent Intubation: No Behavior/Cognition: Alert;Cooperative;Pleasant mood;Confused;Distractible;Requires cueing Oral Cavity Assessment:  Lesions;Dry;Edema;Erythema (Sticky; sore to touch) Oral Care Completed by SLP: Yes Oral Cavity - Dentition: Adequate natural dentition (few missing) Vision:  (n/a) Self-Feeding Abilities: Total assist;Needs assist;Needs set up (could help hold cup) Patient Positioning: Upright in bed (needed full positioning) Baseline Vocal Quality: Low vocal intensity Volitional Cough: Congested Volitional Swallow: Able to elicit    Oral/Motor/Sensory Function Overall Oral Motor/Sensory Function: Generalized oral weakness (grossly overall) Facial Symmetry: Within Functional Limits Lingual Symmetry: Within Functional Limits Lingual Strength:  (weak overall)   Ice Chips Ice chips: Impaired Other Comments: per NSG, coughing noted earlier   Thin Liquid Thin Liquid: Not tested    Nectar Thick Nectar Thick Liquid: Impaired (larger sips) Presentation: Cup;Self Fed;Spoon (helped to hold cup) Oral Phase Impairments: Reduced labial seal;Poor awareness of bolus Oral phase functional implications: Prolonged oral transit;Oral residue Pharyngeal Phase Impairments: Cough - Immediate;Suspected delayed Swallow;Multiple swallows (w/ larger bolus sips of Nectar; tsps trials appeared adequate)   Honey Thick Honey Thick Liquid: Not tested   Puree Puree: Impaired Presentation: Spoon (fed; 8 trials) Oral Phase Impairments: Reduced labial seal;Poor awareness of bolus Oral Phase Functional Implications: Oral residue;Prolonged oral transit (min) Pharyngeal Phase Impairments: Multiple swallows Other Comments: no overt coughing noted   Solid     Solid: Not tested Other Comments: pt's mouth is too sore        Orinda Kenner, MS, [employer] Speech Language Pathologist [facility] [phone] Kaylaann Mountz 03/06/2021,1:46 PM

## 2021-03-06 NOTE — Progress Notes (Signed)
Patient ID: Stephen [name], male   DOB: 1946/08/16, 75 y.o.   MRN: [id number]  d/w wife Stephen [name] Pt has a overall very poor prognosis given his multiple co-morbidities and h/o recurrent aspiration Discussed GOC and code status. Wife is requesting DNR/DNI

## 2021-03-07 DIAGNOSIS — K219 Gastro-esophageal reflux disease without esophagitis: Secondary | ICD-10-CM | POA: Diagnosis not present

## 2021-03-07 DIAGNOSIS — J690 Pneumonitis due to inhalation of food and vomit: Secondary | ICD-10-CM | POA: Diagnosis not present

## 2021-03-07 DIAGNOSIS — I10 Essential (primary) hypertension: Secondary | ICD-10-CM | POA: Diagnosis not present

## 2021-03-07 DIAGNOSIS — J9621 Acute and chronic respiratory failure with hypoxia: Secondary | ICD-10-CM | POA: Diagnosis not present

## 2021-03-07 MED ORDER — NEPRO/CARBSTEADY PO LIQD: 237.0000 mL | Freq: Three times a day (TID) | ORAL | Status: DC

## 2021-03-07 MED ORDER — ASCORBIC ACID 500 MG PO TABS
250.0000 mg | ORAL_TABLET | Freq: Two times a day (BID) | ORAL | Status: DC
  Administered 2021-03-07 – 2021-03-08 (×2): 250 mg via ORAL
  Filled 2021-03-07 (×2): qty 1

## 2021-03-07 NOTE — Progress Notes (Signed)
[name] NAME: Stephen [name]    MR#:  [id number]  DATE OF BIRTH:  15-Apr-1946  SUBJECTIVE:  came in with fever respiratory distress and hypoxia. Chronically on 3 L nasal cannula oxygen.  According to the wife patient has not been compliant with honey thick/thick liquid. He drinks clear water and has chronic cough.  Patient overall remains stable. Sats 96% on 2 L. Tolerating current diet. Wife at bedside. She seems to be overwhelmed with taking care of patient.  REVIEW OF SYSTEMS:   Review of Systems  Constitutional: Negative for chills, fever and weight loss.  HENT: Negative for ear discharge, ear pain and nosebleeds.   Eyes: Negative for blurred vision, pain and discharge.  Respiratory: Positive for cough and sputum production. Negative for wheezing and stridor.   Cardiovascular: Negative for chest pain, palpitations, orthopnea and PND.  Gastrointestinal: Negative for abdominal pain, diarrhea, nausea and vomiting.  Genitourinary: Negative for frequency and urgency.  Musculoskeletal: Negative for back pain and joint pain.  Neurological: Positive for weakness. Negative for sensory change, speech change and focal weakness.  Psychiatric/Behavioral: Negative for depression and hallucinations. The patient is not nervous/anxious.    Tolerating Diet: Tolerating PT: bedbound for last two months  DRUG ALLERGIES:   Allergies  Allergen Reactions  . No Known Allergies     VITALS:  Blood pressure (!) 105/59, pulse 82, temperature 98 F (36.7 C), resp. rate 14, height 5\' 7"  (1.702 m), weight 73.2 kg, SpO2 97 %.  PHYSICAL EXAMINATION:   Physical Exam  GENERAL:  75 y.o.-year-old patient lying in the bed with no acute distress. Appears chronically ill HEENT: Head atraumatic, normocephalic. Oropharynx and nasopharynx clear. Oral mucosa dry. Thrush and ulcers present  LUNGS: decreased breath sounds bilaterally, no wheezing, rales,  rhonchi. No use of accessory muscles of respiration.  CARDIOVASCULAR: S1, S2 normal. No murmurs, rubs, or gallops.  ABDOMEN: Soft, nontender, nondistended. Bowel sounds present. No organomegaly or mass.  EXTREMITIES: No cyanosis, clubbing or edema b/l.    NEUROLOGIC: overall debilitated. Generalized weakness PSYCHIATRIC:  patient is alert and oriented x 3.  SKIN:  Pressure Injury 03/05/21 Coccyx Medial Stage 2 -  Partial thickness loss of dermis presenting as a shallow open injury with a red, pink wound bed without slough. stage 2 pressure injury surrounded by DTI on coxxyx/sacrum (Active)  03/05/21 1639  Location: Coccyx  Location Orientation: Medial  Staging: Stage 2 -  Partial thickness loss of dermis presenting as a shallow open injury with a red, pink wound bed without slough.  Wound Description (Comments): stage 2 pressure injury surrounded by DTI on coxxyx/sacrum  Present on Admission: Yes   LABORATORY PANEL:  CBC Recent Labs  Lab 03/06/21 0320  WBC 14.8*  HGB 11.3*  HCT 34.1*  PLT 296    Chemistries  Recent Labs  Lab 03/05/21 0810 03/06/21 0320  NA 140 140  K 3.7 3.4*  CL 103 104  CO2 28 23  GLUCOSE 88 75  BUN 19 17  CREATININE 0.80 0.70  CALCIUM 9.0 8.7*  AST 39  --   ALT 25  --   ALKPHOS 116  --   BILITOT 0.8  --    Cardiac Enzymes No results for input(s): TROPONINI in the last 168 hours. RADIOLOGY:  No results found. ASSESSMENT AND PLAN:   Stephen [name] is a 75 y.o. 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  Acute on chronic respiratory failure with hypoxia due to aspiration pneumonia [facility]):  --Patient's worsening shortness of breath is most likely due to aspiration pneumonia.  Patient also has history of oropharyngeal dysphagia.    temperature 97.3.  Has leukocytosis  with WBC 18.3, but does not meet criteria for sepsis currently.  Lactic acid is normal.  procalcitonin 0.13.  -- patient hemodynamically stable. Continue IV unasyn -- blood culture negative -- seen by speech therapist. Patient has chronic aspiration noted on modified barium swallow done in early part of March. He is at a high risk for aspiration. Patient continues to drink clear water at home instead of honey thickened liquids. -- Discussed with patient's wife at bedside. She understands patient has poor prognosis and is at a risk for cardiorespiratory arrest and recurrent lung damage from aspiration. Discussed code status and goals of care. Wife is agreeable with DNR DNI.  Patient already has been seen by palliative care in the past.  -HTN (hypertension): pt is not taking Benicar. Bp 127/70 now -IV hydralazine as needed  Smoker and alcohol use: -Nicotine patch -- according to the wife patient has not had alcohol to drink for last two weeks.  Hx of rectal CA: S/p of surgery with colostomy. Patient finished chemotherapy 2016 -No acute issues  GERD (gastroesophageal reflux disease) -protonix  Depression: pt is not taking his Zoloft -observe closely  UTI: Urinalysis showed cloudy appearance, trace amount of leukocyte, many bacteria, WBC 25-50.  Patient has burning on urination. -On broad antibiotics for pneumonia as above -Follow-up of blood culture negative and urine culture less than 10,000 and significant growth    TOC for discharge planning to see if patient can get home health. Assess for home oxygen prior to discharge.  DVT ppx: SQ Lovenox Code Status:DNR/DNI--d/w wife Family Communication:   Yes, patient's wife at bedside Disposition Plan:  Anticipate discharge back to previous environment Consults called:  none Admission status and Level of care: Progressive Cardiac: as inpt     Status is: Inpatient  Remains inpatient appropriate because:Inpatient level of care  appropriate due to severity of illness   Dispo: The patient is from: Home  Anticipated d/c is to: Home  Patient currently is not medically stable to d/c.              Difficult to place patient No  Level of care: Med-Surg   Continues to improve patient will discharged to home tomorrow. Wife and patient in agreement. Patient has all equipment at home including hospital bed     TOTAL TIME TAKING CARE OF THIS PATIENT: *25* minutes.  >50% time spent on counselling and coordination of care  Note: This dictation was prepared with Dragon dictation along with smaller phrase technology. Any transcriptional errors that result from this process are unintentional.  Fritzi Mandes M.D    [facility]   CC: Primary care physician; [name], MDPatient ID: Stephen [name], male   DOB: August 26, 1946, 75 y.o.   MRN: [id number]

## 2021-03-07 NOTE — Progress Notes (Signed)
Initial Nutrition Assessment  DOCUMENTATION CODES:   Not applicable  INTERVENTION:   Nepro Shake po TID, each supplement provides 425 kcal and 19 grams protein  Magic cup TID with meals, each supplement provides 290 kcal and 9 grams of protein  MVI po daily   Vitamin C 250mg  po BID  Pt at high refeed risk; recommend monitor potassium, magnesium and phosphorus labs daily until stable  NUTRITION DIAGNOSIS:   Increased nutrient needs related to cancer and cancer related treatments as evidenced by estimated needs.  GOAL:   Patient will meet greater than or equal to 90% of their needs  MONITOR:   PO intake,Supplement acceptance,Labs,Weight trends,Skin,I & O's  REASON FOR ASSESSMENT:   Malnutrition Screening Tool    ASSESSMENT:   75 y.o. male with medical history significant of rectal cancer metastasized to lung (s/p of colostomy, chemotherapy), HTN, GERD, depression, GERD, smoker, alcohol use and left leg blood clot not on anticoagulants who presents with SOB and was found to have aspiration PNA and UTI  RD working remotely.  Unable to reach pt by phone. Pt is known to nutrition department from a recent previous admit. Pt previously reported poor appetite and oral intake for the past 6 months. Per chart, pt with a 29lb(15%) weight loss over the past 8 months; this is severe weight loss. Wife reports pt with continued etoh abuse. Pt reported previously that he does not like milky supplements. Pt seen by SLP and placed on a dysphagia 1/nectar thick diet. RD will add supplements and MVI to help pt meet his estimated needs. RD will have to provide a milky supplement as pt is on thickened liquids. Pt is likely at high refeed risk. Pt is at high risk for malnutrition. RD will obtain nutrition related history and exam at follow up.   Medications reviewed and include: ascorbic acid, aspirin, D3, lovenox, MVI, nicotine, protonix, thiamine, B12, unasyn  Labs reviewed: K 3.4(L)  wbc-  14.8(H), Hgb 11.3(L), Hct 34.1(L), MCV 104.0(H), MCH 34.5(H)  NUTRITION - FOCUSED PHYSICAL EXAM: Unable to perform at this time   Diet Order:   Diet Order            DIET - DYS 1 Room service appropriate? Yes with Assist; Fluid consistency: Nectar Thick  Diet effective now                EDUCATION NEEDS:   No education needs have been identified at this time  Skin:  Skin Assessment: Reviewed RN Assessment (Stage II coccyx)  Last BM:  4/24  Height:   Ht Readings from Last 1 Encounters:  03/05/21 5\' 7"  (1.702 m)    Weight:   Wt Readings from Last 1 Encounters:  03/05/21 73.2 kg    Ideal Body Weight:  67.3 kg  BMI:  Body mass index is 25.28 kg/m.  Estimated Nutritional Needs:   Kcal:  1900-2200kcal/day  Protein:  95-110g/day  Fluid:  1.9-2.2L/day  Koleen Distance MS, RD, LDN Please refer to [facility] for RD and/or RD on-call/weekend/after hours pager

## 2021-03-08 DIAGNOSIS — K219 Gastro-esophageal reflux disease without esophagitis: Secondary | ICD-10-CM | POA: Diagnosis not present

## 2021-03-08 DIAGNOSIS — L8940 Pressure ulcer of contiguous site of back, buttock and hip, unspecified stage: Secondary | ICD-10-CM | POA: Diagnosis not present

## 2021-03-08 DIAGNOSIS — J9621 Acute and chronic respiratory failure with hypoxia: Secondary | ICD-10-CM | POA: Diagnosis not present

## 2021-03-08 DIAGNOSIS — J690 Pneumonitis due to inhalation of food and vomit: Secondary | ICD-10-CM | POA: Diagnosis not present

## 2021-03-08 LAB — LEGIONELLA PNEUMOPHILA SEROGP 1 UR AG: L. pneumophila Serogp 1 Ur Ag: NEGATIVE

## 2021-03-08 MED ORDER — DM-GUAIFENESIN ER 30-600 MG PO TB12: 1.0000 | ORAL_TABLET | Freq: Two times a day (BID) | ORAL | 0 refills | Status: AC | PRN

## 2021-03-08 MED ORDER — ALBUTEROL SULFATE (2.5 MG/3ML) 0.083% IN NEBU: 2.5000 mg | INHALATION_SOLUTION | RESPIRATORY_TRACT | 12 refills | Status: DC | PRN

## 2021-03-08 MED ORDER — AMOXICILLIN-POT CLAVULANATE 875-125 MG PO TABS
1.0000 | ORAL_TABLET | Freq: Two times a day (BID) | ORAL | Status: DC
  Administered 2021-03-08: 10:00:00 1 via ORAL
  Filled 2021-03-08: qty 1

## 2021-03-08 MED ORDER — AMOXICILLIN-POT CLAVULANATE 875-125 MG PO TABS: 1.0000 | ORAL_TABLET | Freq: Two times a day (BID) | ORAL | 0 refills | Status: AC

## 2021-03-08 NOTE — Discharge Instructions (Signed)
Dysphagia level 1 (puree) w/ NECTAR consistency liquids VIA TPS; strict aspiration precautions and feeding support; Supervision w/ po's.  Stop giving po's if increased coughing noted.  Frequent oral care.  Medication Administration: Crushed with puree (for safe swallowing)

## 2021-03-08 NOTE — Progress Notes (Signed)
[facility]) Hospital Liaison RN note:  Received new referral for [facility] out patient palliative program to follow post discharge from Dr. Posey Pronto. Patient information given to referral. Doran Clay, TOC is aware. Plan is to discharge home today.  Thank you for this referral.  Zandra Abts, RN [facility] Hospital Liaison  [phone]

## 2021-03-08 NOTE — Care Management Important Message (Signed)
Important Message  Patient Details  Name: Stephen [name] MRN: [id number] Date of Birth: Jul 20, 1946   Medicare Important Message Given:  Yes     Juliann Pulse A Guthrie Lemme 03/08/2021, 10:35 AM

## 2021-03-08 NOTE — Progress Notes (Addendum)
Speech Language Pathology Treatment: Dysphagia  Patient Details Name: Stephen [name] MRN: [id number] DOB: 11/16/1945 Today's Date: 03/08/2021 Time: 3664-4034 SLP Time Calculation (min) (ACUTE ONLY): 35 min  Assessment / Plan / Recommendation Clinical Impression  Pt seen for ongoing assessment of toleration of dysphagia diet; education on dysphagia and aspiration precautions. Pt appears weak lying in bed -- some engagement but verbal cues needed for encouragement to take po's as well as for feeding support.  Pt presents w/ chronic h/o oropharyngeal phase dysphagia as per recent MBSS results (02/22/2021). Noted Neuromuscular impact; also suspect impact from declined Cognitive status(baseline chronic ETOH use/abuse). These issues can lead to aspiration of oral intake especially if attempting to drink thin liquids (Nectar liquids were recommended per recent MBSS d/t aspiration w/ thin liquids noted on exam - see full report in chart). Pt is also suffering from significant oral discomfort and erythema and ulcer-like mucosal breakdown -- NSG/MD made aware. Pt required min-mod tactile/verbal/visual cues for orientation to bolus presentation, follow through w/ tasks, and self-feeding support. He fed self minimally helping to hold Cup.  Pt consumed trials of small TSPs of Nectar liquids mostly fed to him w/ no immediate, overt clinical s/s of aspiration noted w/ the trials. No decline in respiratory presentation noted during/post trials, however congested coughing occurred, however, a much delayed congested, half-cough was noted toward end of po's. Pt declined most attempts at more po intake of either Nectar liquids or purees. Oral phase was deliberate bolus management and oral clearing of all boluses given; lingual sweeping w/ f/u swallowing intermittently.  Recommend continue a dysphagia level 1 diet(puree) w/ Nectar consistency liquids via TSP; aspiration precautions; Pills Crushed in puree for safety;  feeding support and supervision at meals, reduce Distractions during meals and check ofr oral clearing during/post intake.  IF increased coughing noted, then inform MD. Discussed need for oral care treatment via Swabs d/t breakdown. Handouts on ordering, and Nectar thickened liquids, were given for d/c home today. Recommend Palliative Care f/u in pt's Home for ongoing management of Chronic Aspiration and Poor oral intake overall; multiple Baseline Chronic medical issues' GOC. NSG/MD updated. Precautions posted.    HPI HPI: Pt is a 75 y.o. male with medical history significant of rectal Cancer metastasized to lung (s/p for colostomy, chemotherapy), HTn, GERD, depression, GERD, smoker, ETOH use, left leg blood clot not on anticoagulants, who presents with abdominal discomfort, weakness, and Acute on chronic respiratory failure with hypoxia due to aspiration pneumonia.  Per patient and his wife, patient has been generalized weakness for almost 2 weeks since his D/C from previous admit. Wife endorses pt has not been eating/drinking at home for ~1 week.  During previous admit ~2 weeks ago, CT of Abd.: "Partial small bowel obstruction without focal transition point.  2. Small volume of reactive ascites in the right pelvis in the  region of dilated and fluid-filled small bowel.  3. Bilateral lower lobe atelectasis versus small volume aspiration  superimposed on a background of diffuse chronic bronchial wall  thickening.  4. Mild circumferential thickening of the distal esophagus concerning for esophagitis.".  A MBSS was completed on 02/22/2021 w/ moderate oropharyngeal phase dysphagia, impacted by pt's Cognitive decline/status, resulting in Aspiration of thin liquids.  A Dysphagia diet was initiated then including Nectar consistency liquids which Wife stated he was "not" drinking regularly at home.      SLP Plan  Continue with current plan of care (pt to d/c home today per Team)  Recommendations  Diet  recommendations: Dysphagia 1 (puree);Nectar-thick liquid Liquids provided via: Cup;No straw Medication Administration: Crushed with puree (for safer swallowing) Supervision: Patient able to self feed;Staff to assist with self feeding;Full supervision/cueing for compensatory strategies Compensations: Minimize environmental distractions;Slow rate;Small sips/bites;Lingual sweep for clearance of pocketing;Multiple dry swallows after each bite/sip;Follow solids with liquid Postural Changes and/or Swallow Maneuvers: Upright 30-60 min after meal;Seated upright 90 degrees;Out of bed for meals                General recommendations:  (Palliative Care consult for [place] and support at Home) Oral Care Recommendations: Oral care BID;Oral care before and after PO;Staff/trained caregiver to provide oral care Follow up Recommendations: Home health SLP SLP Visit Diagnosis: Dysphagia, oropharyngeal phase (R13.12) (Cognitive decline baseline) Plan: Continue with current plan of care (pt to d/c home today per Team)       GO                 Orinda Kenner, MS, [place] [facility]ologist [facility] [phone] [facility] 03/08/2021, 3:46 PM

## 2021-03-08 NOTE — Discharge Summary (Addendum)
[name] [place] NAME: Stephen [name]    MR#:  [id number]  DATE OF BIRTH:  11-Oct-1946  DATE OF ADMISSION:  03/05/2021 ADMITTING PHYSICIAN: Ivor Costa, MD  DATE OF DISCHARGE: 03/08/2021  PRIMARY CARE PHYSICIAN: [name], MD    ADMISSION DIAGNOSIS:  Aspiration pneumonia ([place]) [J69.0] Healthcare-associated pneumonia [J18.9] Acute respiratory failure with hypoxia ([place]) [J96.01]  DISCHARGE DIAGNOSIS:  Aspiration Pneumonia, recurrent H/o Chronic Dysphagia SECONDARY DIAGNOSIS:   Past Medical History:  Diagnosis Date  . Allergic rhinitis   . Blood clot in vein 2015   left leg   . History of anemia   . History of chicken pox   . HTN (hypertension)   . Neuropathy    arms and legs - S/P Cancer tx  . 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 Smoker     HOSPITAL COURSE:   Stephen [name] a 75 y.o.malewith medical history significant ofrectal cancer metastasized to lung (s/pofcolostomy, chemotherapy),HTN, GERD, depression, GERD,smoker, alcohol use, left leg blood clot not on anticoagulants, who presents withSOB.  Acute on chronic respiratory failure with hypoxiadue to aspiration pneumonia (HCC)--Recurrent Chronic Dysphagia (recent MBSS) Chronic oxygen 2-3L/min --Patient's worsening shortness of breath is most likely due to aspiration pneumonia. Patient also has history of oropharyngeal dysphagia.  temperature 97.3. Has leukocytosis with WBC 18.3, butdoes not meet criteria for sepsis currently. Lactic acid is normal. procalcitonin 0.13.  -- patient hemodynamically stable. Continue IV unasyn--change to po augmentin -- blood culture negative -- seen by speech therapist. Patient has chronic aspiration noted on modified barium swallow done in early part of March. He is at a high risk for aspiration. Patient continues to drink clear water at home instead of honey thickened liquids--places  him at risk for aspiraiton -- Discussed with patient's wife at bedside. She understands patient has poor prognosis and is at a risk for cardiorespiratory arrest and recurrent lung damage from aspiration. Discussed code status and goals of care. Wife is agreeable with DNR DNI.  Patient already has been seen by palliative care in the past. Will arrange Palliative care to f/u as out pt  -HTN (hypertension): pt is not takingBenicar. Bp 127/70 now -IV hydralazine as needed  Smokerand alcohol use: -Nicotine patch -- according to the wife patient has not had alcohol to drink for last two weeks.  Hx of rectal CA:S/p of surgery with colostomy. Patient finished chemotherapy 2016 -No acute issues  GERD (gastroesophageal reflux disease) -protonix  Depression: pt is not taking hisZoloft  UTI:-Follow-up of blood culture negative and urine culture less than 10,000 and significant growth --no indication for abx  Nutrition Status: Nutrition Problem: Increased nutrient needs Etiology: cancer and cancer related treatments Signs/Symptoms: estimated needs    TOC for discharge planning -- home health.   DVT ppx: SQ Lovenox Code Status:DNR/DNI--d/w wife Family Communication: Yes, patient's wife at bedside 4/24 Disposition Plan: Anticipate discharge back to previous environment today Consults called:none  Status is: Inpatient   Dispo: The patient is from:Home Anticipated d/c is EV:OJJK Patient currently is medically best optimized for d/c. Difficult to place patient No  Level of care: Med-Surg  d/c home today  CONSULTS OBTAINED:    DRUG ALLERGIES:   Allergies  Allergen Reactions  . No Known Allergies     DISCHARGE MEDICATIONS:   Allergies as of 03/08/2021      Reactions   No Known Allergies       Medication  List    TAKE these medications   albuterol (2.5 MG/3ML) 0.083% nebulizer solution Commonly known  as: PROVENTIL Take 3 mLs (2.5 mg total) by nebulization every 4 (four) hours as needed for wheezing or shortness of breath.   amoxicillin-clavulanate 875-125 MG tablet Commonly known as: AUGMENTIN Take 1 tablet by mouth every 12 (twelve) hours for 5 days.   aspirin EC 81 MG tablet Take 81 mg by mouth daily.   dextromethorphan-guaiFENesin 30-600 MG 12hr tablet Commonly known as: MUCINEX DM Take 1 tablet by mouth 2 (two) times daily as needed for up to 7 days for cough.   esomeprazole 20 MG packet Commonly known as: NEXIUM Take 20 mg by mouth daily before breakfast.   folic acid 1 MG tablet Commonly known as: FOLVITE Take 1 tablet (1 mg total) by mouth daily.   gabapentin 100 MG capsule Commonly known as: NEURONTIN Take 2 capsules (200 mg total) by mouth 2 (two) times daily.   loratadine 10 MG tablet Commonly known as: CLARITIN Take 10 mg by mouth daily as needed.   multivitamin with minerals Tabs tablet Take 1 tablet by mouth daily.   nicotine 14 mg/24hr patch Commonly known as: NICODERM CQ - dosed in mg/24 hours Place 1 patch (14 mg total) onto the skin daily.   oxyCODONE-acetaminophen 7.5-325 MG tablet Commonly known as: Percocet Take 1 tablet by mouth every 6 (six) hours as needed for severe pain.   vitamin B-12 100 MCG tablet Commonly known as: CYANOCOBALAMIN Take 100 mcg by mouth daily.   vitamin C 250 MG tablet Commonly known as: ASCORBIC ACID Take 250 mg by mouth daily.   Vitamin D (Cholecalciferol) 25 MCG (1000 UT) Tabs Take 1,000 Units by mouth daily.            Durable Medical Equipment  (From admission, onward)         Start     Ordered   03/08/21 0000  For home use only DME Nebulizer machine       Question Answer Comment  Patient needs a nebulizer to treat with the following condition Acute hypoxemic respiratory failure ([place])   Length of Need Lifetime      03/08/21 1015           Discharge Care Instructions  (From admission,  onward)         Start     Ordered   03/08/21 0000  Discharge wound care:       Comments: Foam padding, turn pt every 2-3 hours   03/08/21 1015          If you experience worsening of your admission symptoms, develop shortness of breath, life threatening emergency, suicidal or homicidal thoughts you must seek medical attention immediately by calling 911 or calling your MD immediately  if symptoms less severe.  You Must read complete instructions/literature along with all the possible adverse reactions/side effects for all the Medicines you take and that have been prescribed to you. Take any new Medicines after you have completely understood and accept all the possible adverse reactions/side effects.   Please note  You were cared for by a hospitalist during your hospital stay. If you have any questions about your discharge medications or the care you received while you were in the hospital after you are discharged, you can call the unit and asked to speak with the hospitalist on call if the hospitalist that took care of you is not available. Once you are discharged, your primary care  physician will handle any further medical issues. Please note that NO REFILLS for any discharge medications will be authorized once you are discharged, as it is imperative that you return to your primary care physician (or establish a relationship with a primary care physician if you do not have one) for your aftercare needs so that they can reassess your need for medications and monitor your lab values. Today   SUBJECTIVE   No new issues per RN No respiratory distress sats 94-96% on 3 liters  VITAL SIGNS:  Blood pressure 112/72, pulse 80, temperature 97.8 F (36.6 C), resp. rate 16, height 5\' 7"  (1.702 m), weight 73.2 kg, SpO2 94 %.  I/O:    Intake/Output Summary (Last 24 hours) at 03/08/2021 1016 Last data filed at 03/08/2021 0957 Gross per 24 hour  Intake 0 ml  Output 800 ml  Net -800 ml     PHYSICAL EXAMINATION:   GENERAL:  75 y.o.-year-old patient lying in the bed with no acute distress. Appears chronically ill HEENT: Head atraumatic, normocephalic.  Oral mucosa dry. Thrush and ulcers present LUNGS: decreased breath sounds bilaterally, no wheezing, rales, rhonchi. No use of accessory muscles of respiration.  CARDIOVASCULAR: S1, S2 normal. No murmurs, rubs, or gallops.  ABDOMEN: Soft, nontender, nondistended. Bowel sounds present. No organomegaly or mass.  EXTREMITIES: No cyanosis, clubbing or edema b/l.    NEUROLOGIC: overall debilitated. Generalized weakness PSYCHIATRIC:  patient is alert and awake  SKIN:  Pressure Injury 03/05/21 Coccyx Medial Stage 2 -  Partial thickness loss of dermis presenting as a shallow open injury with a red, pink wound bed without slough. stage 2 pressure injury surrounded by DTI on coxxyx/sacrum (Active)  03/05/21 1639  Location: Coccyx  Location Orientation: Medial  Staging: Stage 2 -  Partial thickness loss of dermis presenting as a shallow open injury with a red, pink wound bed without slough.  Wound Description (Comments): stage 2 pressure injury surrounded by DTI on coxxyx/sacrum  Present on Admission: Yes    DATA REVIEW:   CBC  Recent Labs  Lab 03/06/21 0320  WBC 14.8*  HGB 11.3*  HCT 34.1*  PLT 296    Chemistries  Recent Labs  Lab 03/05/21 0810 03/06/21 0320  NA 140 140  K 3.7 3.4*  CL 103 104  CO2 28 23  GLUCOSE 88 75  BUN 19 17  CREATININE 0.80 0.70  CALCIUM 9.0 8.7*  AST 39  --   ALT 25  --   ALKPHOS 116  --   BILITOT 0.8  --     Microbiology Results   Recent Results (from the past 240 hour(s))  Blood culture (routine x 2)     Status: None (Preliminary result)   Collection Time: 03/05/21  8:10 AM   Specimen: BLOOD  Result Value Ref Range Status   Specimen Description BLOOD R FOREARM  Final   Special Requests   Final    BOTTLES DRAWN AEROBIC AND ANAEROBIC Blood Culture results may not be optimal  due to an inadequate volume of blood received in culture bottles   Culture   Final    NO GROWTH 3 DAYS Performed at [facility], [address], [place], [place] [address]    Report Status PENDING  Incomplete  Urine culture     Status: Abnormal   Collection Time: 03/05/21  8:10 AM   Specimen: Urine, Random  Result Value Ref Range Status   Specimen Description   Final    URINE, RANDOM Performed  at [facility], [address], [place], [place] [address]    Special Requests   Final    NONE Performed at [facility], [address] [place], [place] [address]    Culture (A)  Final    <10,000 COLONIES/mL INSIGNIFICANT GROWTH Performed at [facility] [address], [place], [place] [address]    Report Status 03/06/2021 FINAL  Final  Blood culture (routine x 2)     Status: None (Preliminary result)   Collection Time: 03/05/21  8:22 AM   Specimen: BLOOD  Result Value Ref Range Status   Specimen Description   Final    BLOOD Blood Culture results may not be optimal due to an inadequate volume of blood received in culture bottles   Special Requests   Final    BOTTLES DRAWN AEROBIC AND ANAEROBIC LEFT ANTECUBITAL   Culture   Final    NO GROWTH 3 DAYS Performed at [facility], [address] [place],  [address]    Report Status PENDING  Incomplete  Resp Panel by RT-PCR (Flu A&B, Covid) Nasopharyngeal Swab     Status: None   Collection Time: 03/05/21  8:22 AM   Specimen: Nasopharyngeal Swab; Nasopharyngeal(NP) swabs in vial transport medium  Result Value Ref Range Status   SARS Coronavirus 2 by RT PCR NEGATIVE NEGATIVE Final    Comment: (NOTE) SARS-CoV-2 target nucleic acids are NOT DETECTED.  The SARS-CoV-2 RNA is generally detectable in upper respiratory specimens during the acute phase of infection. The lowest concentration of SARS-CoV-2 viral copies this assay can detect is 138 copies/mL. A negative result does not  preclude SARS-Cov-2 infection and should not be used as the sole basis for treatment or other patient management decisions. A negative result may occur with  improper specimen collection/handling, submission of specimen other than nasopharyngeal swab, presence of viral mutation(s) within the areas targeted by this assay, and inadequate number of viral copies(<138 copies/mL). A negative result must be combined with clinical observations, patient history, and epidemiological information. The expected result is Negative.  Fact Sheet for Patients:  EntrepreneurPulse.com.au  Fact Sheet for Healthcare Providers:  IncredibleEmployment.be  This test is no t yet approved or cleared by the [place] FDA and  has been authorized for detection and/or diagnosis of SARS-CoV-2 by FDA under an Emergency Use Authorization (EUA). This EUA will remain  in effect (meaning this test can be used) for the duration of the COVID-19 declaration under Section 564(b)(1) of the Act, 21 U.S.C.section 360bbb-3(b)(1), unless the authorization is terminated  or revoked sooner.       Influenza A by PCR NEGATIVE NEGATIVE Final   Influenza B by PCR NEGATIVE NEGATIVE Final    Comment: (NOTE) The Xpert Xpress SARS-CoV-2/FLU/RSV plus assay is intended as an aid in the diagnosis of influenza from Nasopharyngeal swab specimens and should not be used as a sole basis for treatment. Nasal washings and aspirates are unacceptable for Xpert Xpress SARS-CoV-2/FLU/RSV testing.  Fact Sheet for Patients: EntrepreneurPulse.com.au  Fact Sheet for Healthcare Providers: IncredibleEmployment.be  This test is not yet approved or cleared by the [place] FDA and has been authorized for detection and/or diagnosis of SARS-CoV-2 by FDA under an Emergency Use Authorization (EUA). This EUA will remain in effect (meaning this test can be used) for the  duration of the COVID-19 declaration under Section 564(b)(1) of the Act, 21 U.S.C. section 360bbb-3(b)(1), unless the authorization is terminated or revoked.  Performed at [facility],  [place], [place] [address]   MRSA PCR Screening     Status: None   Collection Time: 03/05/21 11:29 AM   Specimen: Nasopharyngeal  Result Value Ref Range Status   MRSA by PCR NEGATIVE NEGATIVE Final    Comment:        The GeneXpert MRSA Assay (FDA approved for NASAL specimens only), is one component of a comprehensive MRSA colonization surveillance program. It is not intended to diagnose MRSA infection nor to guide or monitor treatment for MRSA infections. Performed at [facility], [address] [place], [place] [address]     RADIOLOGY:  No results found.   CODE STATUS:     Code Status Orders  (From admission, onward)         Start     Ordered   03/06/21 1359  Do not attempt resuscitation (DNR)  Continuous       Question Answer Comment  In the event of cardiac or respiratory ARREST Do not call a "code blue"   In the event of cardiac or respiratory ARREST Do not perform Intubation, CPR, defibrillation or ACLS   In the event of cardiac or respiratory ARREST Use medication by any route, position, wound care, and other measures to relive pain and suffering. May use oxygen, suction and manual treatment of airway obstruction as needed for comfort.   Comments d/w wife      03/06/21 1358        Code Status History    Date Active Date Inactive Code Status Order ID Comments User Context   03/05/2021 1231 03/06/2021 1358 Full Code [id number]  Ivor Costa, MD ED   02/19/2021 1305 02/25/2021 0215 Full Code [id number]  Ivor Costa, MD Inpatient   Advance Care Planning Activity       TOTAL TIME TAKING CARE OF THIS PATIENT: *35* minutes.    Fritzi Mandes M.D  [facility]    CC: Primary care physician; [name], MD

## 2021-03-08 NOTE — TOC Initial Note (Signed)
Transition of Care [facility]) - Initial/Assessment Note    Patient Details  Name: Stevon Gough MRN: [id number] Date of Birth: May 30, 1946  Transition of [facility]) CM/SW Contact:    Shelbie Hutching, RN Phone Number: 03/08/2021, 10:28 AM  Clinical Narrative:                 Patient admitted to the hospital with pneumonia.  Patient is from home with his wife, Sydell Axon.  Patient is bed bound at home, they have all needed DME, oxygen, lift, wheelchair.  Patient is medically cleared for discharge home today with home health services.  Patient is open with [facility], orders resumed for RN, PT, OT, aide, speech, and SW.  MD has also ordered a nebulizer machine- [employer] will deliver nebulizer to the room before discharge.   Patient needs EMS transport home- RNCM will arrange.   Expected Discharge Plan: [place] Barriers to Discharge: Barriers Resolved   Patient Goals and CMS Choice Patient states their goals for this hospitalization and ongoing recovery are:: Wife wants to get patient back home with home health services CMS Medicare.gov Compare Post Acute Care list provided to:: Patient Represenative (must comment) Choice offered to / list presented to : Spouse  Expected Discharge Plan and Services Expected Discharge Plan: [place]   Discharge Planning Services: CM Consult Post Acute Care Choice: [place] arrangements for the past 2 months: Single Family Home Expected Discharge Date: 03/08/21               DME Arranged: N/A DME Agency: NA       HH Arranged: RN,PT,OT,Nurse's Aide,Speech Therapy,Social Work [place] Agency: [facility] Date [place] Agency Contacted: 03/08/21 Time [place]: 1024 Representative spoke with at [place]: [place] Arrangements/Services Living arrangements for the past 2 months: [place] with:: Spouse Patient language and need for interpreter reviewed:: Yes Do you feel safe going  back to the place where you live?: Yes      Need for Family Participation in Patient Care: Yes (Comment) Care giver support system in place?: Yes (comment) Current home services: DME (lift, oxygen, hospital bed, wheelchair) Criminal Activity/Legal Involvement Pertinent to Current Situation/Hospitalization: No - Comment as needed  Activities of Daily Living Home Assistive Devices/Equipment: Walker (specify type),Wheelchair,Shower chair with back ADL Screening (condition at time of admission) Patient's cognitive ability adequate to safely complete daily activities?: Yes Is the patient deaf or have difficulty hearing?: No Does the patient have difficulty seeing, even when wearing glasses/contacts?: No Does the patient have difficulty concentrating, remembering, or making decisions?: No Patient able to express need for assistance with ADLs?: Yes Does the patient have difficulty dressing or bathing?: Yes Independently performs ADLs?: No Does the patient have difficulty walking or climbing stairs?: Yes Weakness of Legs: Both Weakness of Arms/Hands: None  Permission Sought/Granted Permission sought to share information with : Case Manager,Family Supports,Other (comment) Permission granted to share information with : Yes, Verbal Permission Granted  Share Information with NAME: Sydell Axon  Permission granted to share info w AGENCY: Company secretary  Permission granted to share info w Relationship: wife     Emotional Assessment       Orientation: : Oriented to Self,Oriented to Place,Oriented to  Time,Oriented to Situation Alcohol / Substance Use: Not Applicable Psych Involvement: No (comment)  Admission diagnosis:  Aspiration pneumonia ([place]) [J69.0] Healthcare-associated pneumonia [J18.9] Acute respiratory failure with hypoxia ([place]) [J96.01] Patient Active Problem List  Diagnosis Date Noted  . Pressure injury of skin 03/06/2021  . Acute on chronic respiratory failure with hypoxia ([place]) 03/05/2021   . Malnutrition of moderate degree 02/20/2021  . 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 Aspiration into lower respiratory tract, sequela 02/19/2021  . UTI (urinary tract infection) 02/19/2021  . Acute respiratory failure with hypoxia ([place]) 02/19/2021  . Aspiration pneumonia ([place]) 02/19/2021  . Partial small bowel obstruction ([place]) 02/19/2021  . Severe sepsis ([place]) 02/19/2021  . GERD (gastroesophageal reflux disease) 02/19/2021  . Depression 02/19/2021  . Alcohol use 02/19/2021  . Rectal cancer ([place]) 11/15/2013  . History of anemia   . HTN (hypertension)   . Smoker    PCP:  [name], MD Pharmacy:   [id number] [place], [place] [address] [place] [address] Phone: [phone] Fax: [phone]     Social Determinants of Health (SDOH) Interventions    Readmission Risk Interventions No flowsheet data found.

## 2021-03-08 NOTE — TOC Transition Note (Signed)
Transition of Care [facility]) - CM/SW Discharge Note   Patient Details  Name: Stephen [name] MRN: [id number] Date of Birth: 09-Jul-1946  [facility] CM/SW Contact:  Shelbie Hutching, RN Phone Number: 03/08/2021, 12:24 PM   Clinical Narrative:    EMS has been arranged to take patient home.  Wife updated on discharge today.  Nebulizer will be delivered to the home by [employer].     Final next level of care: Home w Home Health Services Barriers to Discharge: Barriers Resolved   Patient Goals and CMS Choice Patient states their goals for this hospitalization and ongoing recovery are:: Wife wants to get patient back home with home health services CMS Medicare.gov Compare Post Acute Care list provided to:: Patient Represenative (must comment) Choice offered to / list presented to : Spouse  Discharge Placement                Patient to be transferred to facility by: EMS transport patient home Name of family member notified: Dora Patient and family notified of of transfer: 03/08/21  Discharge Plan and Services   Discharge Planning Services: CM Consult Post Acute Care Choice: Home Health          DME Arranged: N/A DME Agency: NA       HH Arranged: RN,PT,OT,Nurse's Aide,Speech Therapy,Social Work [employer] Agency: [facility] Date [place] Agency Contacted: 03/08/21 Time [place]: 1024 Representative spoke with at [place]: [place] ([place]) Interventions     Readmission Risk Interventions No flowsheet data found.

## 2021-03-08 NOTE — TOC Progression Note (Signed)
Transition of Care [facility]) - Progression Note    Patient Details  Name: Stephen [name] MRN: [id number] Date of Birth: 1946/05/04  [facility] CM/SW Contact  Shelbie Hutching, RN Phone Number: 03/08/2021, 1:00 PM  Clinical Narrative:    OP Palliative referral given to [facility] with [facility] care.    Expected Discharge Plan: [place] Barriers to Discharge: Barriers Resolved  Expected Discharge Plan and Services Expected Discharge Plan: [place]   Discharge Planning Services: CM Consult Post Acute Care Choice: [place] arrangements for the past 2 months: Single Family Home Expected Discharge Date: 03/08/21               DME Arranged: N/A DME Agency: NA       HH Arranged: RN,PT,OT,Nurse's Aide,Speech Therapy,Social Work [employer] Agency: [facility] Date [place] Agency Contacted: 03/08/21 Time [place]: 1024 Representative spoke with at [place]: [place] Determinants of Health ([place]) Interventions    Readmission Risk Interventions No flowsheet data found.

## 2021-03-10 LAB — CULTURE, BLOOD (ROUTINE X 2)
Culture: NO GROWTH
Culture: NO GROWTH

## 2021-03-15 ENCOUNTER — Telehealth: Payer: Self-pay | Admitting: Adult Health Nurse Practitioner

## 2021-03-16 NOTE — Telephone Encounter (Signed)
Spoke with patient's wife, Stephen [name], regarding the Palliative referral and she mentioned something about Hospice and I told her that we were Palliative not Hospice.  I asked her what the name of the company was for Hospice and she asked the patient and then said that he said it was [name]. I mentioned to her that it looked liked they had ordered Home Health services from [facility] when the patient was in the hospital but she kept stating it was Hospice.  *There was a bit of a language barrier, she sounded as if she may be from Asian decent.  I told the wife that I would check into this to see if I could find out and call her back and she was in agreement with this.

## 2021-03-18 ENCOUNTER — Telehealth: Payer: Self-pay | Admitting: Adult Health Nurse Practitioner

## 2021-03-18 NOTE — Telephone Encounter (Signed)
Spoke with patient's wife, Sydell Axon to let her know that I spoke with [facility] and they said that patient was not under their Hospice services.  Wife went and found the information that was left from the [place] and she said that it was [facility], so I told her that would call them to confirm this and if he was under their Hospice program then we would cancel the referral.  I called [facility] and patient was admitted to their services on 03/08/21.  I will cancel the referral and notify MD

## 2021-05-20 ENCOUNTER — Ambulatory Visit: Admission: RE | Admit: 2021-05-20 | Source: Ambulatory Visit

## 2021-05-25 ENCOUNTER — Ambulatory Visit: Payer: Medicare HMO | Admitting: Oncology

## 2021-05-27 ENCOUNTER — Telehealth: Payer: Self-pay

## 2021-05-27 NOTE — Telephone Encounter (Signed)
Called patient to reschedule his imaging and follow up appointments.  Per wife, he is on Hospice and will not need follow up appointments.  Appointments canceled per request.

## 2021-05-27 NOTE — Telephone Encounter (Signed)
11/24/20 check out:  6 months: lab, ct chest.  See provider 1-2 days later.  Patient NS for 05/20/21 CT and is scheduled on 7/19 for results.  Please contact pt to get the CT and lab/MD few days later r/s.

## 2021-06-01 ENCOUNTER — Inpatient Hospital Stay: Admitting: Oncology

## 2021-06-11 ENCOUNTER — Ambulatory Visit: Payer: Medicare HMO

## 2021-06-16 ENCOUNTER — Ambulatory Visit: Payer: Medicare HMO | Admitting: Oncology

## 2021-12-03 ENCOUNTER — Other Ambulatory Visit: Payer: Self-pay

## 2021-12-03 ENCOUNTER — Emergency Department: Payer: Medicare HMO

## 2021-12-03 ENCOUNTER — Encounter: Payer: Self-pay | Admitting: Emergency Medicine

## 2021-12-03 DIAGNOSIS — Z859 Personal history of malignant neoplasm, unspecified: Secondary | ICD-10-CM | POA: Insufficient documentation

## 2021-12-03 DIAGNOSIS — E871 Hypo-osmolality and hyponatremia: Secondary | ICD-10-CM | POA: Insufficient documentation

## 2021-12-03 DIAGNOSIS — M7989 Other specified soft tissue disorders: Secondary | ICD-10-CM | POA: Diagnosis present

## 2021-12-03 DIAGNOSIS — R600 Localized edema: Secondary | ICD-10-CM | POA: Insufficient documentation

## 2021-12-03 LAB — BASIC METABOLIC PANEL
Anion gap: 7 (ref 5–15)
BUN: 16 mg/dL (ref 8–23)
CO2: 25 mmol/L (ref 22–32)
Calcium: 9 mg/dL (ref 8.9–10.3)
Chloride: 102 mmol/L (ref 98–111)
Creatinine, Ser: 1.24 mg/dL (ref 0.61–1.24)
GFR, Estimated: >60 mL/min (ref >60)
Glucose, Bld: 112 mg/dL — ABNORMAL HIGH (ref 70–99)
Potassium: 3.9 mmol/L (ref 3.5–5.1)
Sodium: 134 mmol/L — ABNORMAL LOW (ref 135–145)

## 2021-12-03 LAB — CBC
HCT: 41.1 % (ref 39.0–52.0)
Hemoglobin: 13.8 g/dL (ref 13.0–17.0)
MCH: 32.9 pg (ref 26.0–34.0)
MCHC: 33.6 g/dL (ref 30.0–36.0)
MCV: 97.9 fL (ref 80.0–100.0)
Platelets: 160 10*3/uL (ref 150–400)
RBC: 4.2 MIL/uL — ABNORMAL LOW (ref 4.22–5.81)
RDW: 16.1 % — ABNORMAL HIGH (ref 11.5–15.5)
WBC: 8.6 10*3/uL (ref 4.0–10.5)
nRBC: 0 % (ref 0.0–0.2)

## 2021-12-03 NOTE — ED Provider Triage Note (Signed)
Emergency Medicine Provider Triage Evaluation Note  Stephen [name], a 76 y.o. male  was evaluated in triage.  Pt complains of Pt complains of LLE edema. Patient with a remote history of DVT presents to the ED with recent onset of LLE edema. He is bedbound, and denies any recent injury, chest pain, SOB, or fevers. .  Review of Systems  Positive: LLE edema Negative: CP, SOB  Physical Exam  Temp 98.1 F (36.7 C) (Oral)  Gen:   Awake, no distress   Resp:  Normal effort CTA MSK:   Moves extremities without difficulty LLE 2+ pitting edema Other:  CVS: RRR  Medical Decision Making  Medically screening exam initiated at 5:52 PM.  Appropriate orders placed.  Stephen [name] was informed that the remainder of the evaluation will be completed by another provider, this initial triage assessment does not replace that evaluation, and the importance of remaining in the ED until their evaluation is complete.  Geriatric patient with ED evaluation of LLE edema.    Stephen Needles, PA-C 12/03/21 1753

## 2021-12-03 NOTE — ED Triage Notes (Addendum)
Pt in via [facility] from home, reports increased left leg swelling over last few days with decreased sensation to that leg.  Left leg is significantly swollen in comparison to right.   Because he is wheelchair bound and sedentary, PCP expressed concern for DVT.    Moderate pedal pulse palpated on left foot per this RN.  Vitals WDL, NAD noted at this time.

## 2021-12-03 NOTE — ED Triage Notes (Signed)
First Nurse Note:  C/O leg swelling x 2-3 days, worse over past day.  Denies pain.  VS wnl

## 2021-12-04 ENCOUNTER — Emergency Department
Admission: EM | Admit: 2021-12-04 | Discharge: 2021-12-04 | Disposition: A | Discharge status: Home / Self Care | Payer: Medicare HMO | Attending: Emergency Medicine | Admitting: Emergency Medicine

## 2021-12-04 DIAGNOSIS — R609 Edema, unspecified: Secondary | ICD-10-CM

## 2021-12-04 DIAGNOSIS — M7989 Other specified soft tissue disorders: Secondary | ICD-10-CM

## 2021-12-04 NOTE — ED Notes (Signed)
[facility] called to transport pt to home

## 2021-12-04 NOTE — Discharge Instructions (Signed)
Your workup in the Emergency Department today was reassuring.  We did not find any specific abnormalities.  Your lab work was within normal limits and your ultrasound was negative for DVT (blood clot).  It appears that you have some peripheral edema although it is somewhat unusual but it seems to be isolated to your left leg.  You may benefit from increased mobility, exercise, etc.    We recommend take your regular medications and follow up with the doctor(s) listed in these documents as recommended.  Return to the Emergency Department if you develop new or worsening symptoms that concern you.

## 2021-12-04 NOTE — ED Provider Notes (Signed)
[facility] Provider Note    Event Date/Time   First MD Initiated Contact with Patient 12/04/21 0410     (approximate)   History   Leg Swelling   HPI  Stephen [name] is a 76 y.o. male with medical history including cancer and chronic back problems that results not in paralysis but with him being bedbound.  He presents with his wife for evaluation of left leg swelling.  This has been gradually worsening over the course of the last week.  It seems to go from his lower leg up to his thigh.  It feels a little bit more warm than usual but it is not red and there is no rash.  He has had no traumatic injury.  Given his relative immobility, they called his PCP who was worried that he may have a DVT and encouraged him to come to the emergency department for evaluation.  The patient reports no pain.  He has an indwelling Foley catheter as well as a ostomy, but he has no concerns about either of those functions.  He has had no chest pain, shortness of breath, nausea, nor vomiting.  No recent fever.  No change in his baseline weakness in his extremities and he has normal use of his arms.  No other swelling noted.  No recent trauma.     Physical Exam   Triage Vital Signs: ED Triage Vitals  Enc Vitals Group     BP 12/04/21 0144 (!) 150/77     Pulse Rate 12/03/21 1752 72     Resp 12/03/21 1752 17     Temp 12/03/21 1752 98.1 F (36.7 C)     Temp Source 12/03/21 1752 Oral     SpO2 12/03/21 1752 96 %     Weight 12/03/21 1753 84.8 kg (187 lb)     Height 12/03/21 1753 1.702 m (5\' 7" )     Head Circumference --      Peak Flow --      Pain Score 12/03/21 1753 0     Pain Loc --      Pain Edu? --      Excl. in [place]? --     Most recent vital signs: Vitals:   12/03/21 1752 12/04/21 0144  BP:  (!) 150/77  Pulse: 72 67  Resp: 17 19  Temp: 98.1 F (36.7 C) 98.2 F (36.8 C)  SpO2: 96% 97%     General: Awake, no distress.  Generally well-appearing in spite of his  chronic medical conditions, in good spirits. CV:  Good peripheral perfusion.  Normal capillary refill. Resp:  Normal effort.  Abd:  No distention.  Other:  There is some mild edema throughout the left leg, from his thigh down to his upper lower leg.  The foot and distal part of the lower leg do not seem to be involved.  There is no erythema, no induration, soft and easily compressible compartments with no evidence of compartment syndrome.  No evidence of infection.  Nontender to palpation.   ED Results / Procedures / Treatments   Labs (all labs ordered are listed, but only abnormal results are displayed) Labs Reviewed  CBC - Abnormal; Notable for the following components:      Result Value   RBC 4.20 (*)    RDW 16.1 (*)    All other components within normal limits  BASIC METABOLIC PANEL - Abnormal; Notable for the following components:   Sodium 134 (*)    Glucose,  Bld 112 (*)    All other components within normal limits     RADIOLOGY I personally reviewed the patient's ultrasound and see no acute abnormalities such as free fluid or abscess.  The radiologist also identifies no DVT but does comment on soft tissue edema.    PROCEDURES:  Critical Care performed: No  Procedures   MEDICATIONS ORDERED IN ED: Medications - No data to display   IMPRESSION / MDM / [place] / ED COURSE  I reviewed the triage vital signs and the nursing notes.                              Differential diagnosis includes, but is not limited to, peripheral edema, DVT, cellulitis.  Patient is at higher risk than baseline due to his chronic medical conditions.  However his vital signs are stable and within normal limits.  He is in no pain.  He is in no respiratory distress.  No evidence to support acute infection such as tachycardia or fever.  He has no leukocytosis on his CBC and a normal hemoglobin.  His basic metabolic panel is within normal limits other than very mild hyponatremia.  As  described above, his ultrasound shows no evidence of DVT but comments on soft tissue swelling.  He seems to have peripheral edema that is isolated to the left lower extremity.  While this is odd, it is not unheard of.  I talked with him and his wife about this and they are comfortable with the plan for outpatient follow-up.  I explained to them that I do not think it would be a good idea to start diuretics for unilateral peripheral edema and they agree and do not want to start any medications right now.  They will follow-up as an outpatient next week.  I gave my usual and customary return precautions.  Though I briefly considered admission given his age and comorbidities, he does not meet inpatient criteria and would not benefit from staying in the hospital.           FINAL CLINICAL IMPRESSION(S) / ED DIAGNOSES   Final diagnoses:  Peripheral edema     Rx / DC Orders   ED Discharge Orders     None        Note:  This document was prepared using Dragon voice recognition software and may include unintentional dictation errors.   Hinda Kehr, MD 12/04/21 [phone]

## 2022-07-06 ENCOUNTER — Ambulatory Visit: Payer: Medicare HMO | Admitting: Family Medicine

## 2022-11-08 ENCOUNTER — Emergency Department: Payer: Medicare HMO

## 2022-11-08 ENCOUNTER — Inpatient Hospital Stay
Admission: EM | Admit: 2022-11-08 | Discharge: 2022-11-09 | DRG: 872 | Disposition: A | Discharge status: Other Acute Inpatient Hospital | Payer: Medicare HMO | Source: Other Acute Inpatient Hospital | Attending: Emergency Medicine | Admitting: Emergency Medicine

## 2022-11-08 ENCOUNTER — Encounter: Payer: Self-pay | Admitting: Emergency Medicine

## 2022-11-08 ENCOUNTER — Encounter (HOSPITAL_COMMUNITY): Payer: Self-pay

## 2022-11-08 DIAGNOSIS — K8070 Calculus of gallbladder and bile duct without cholecystitis without obstruction: Secondary | ICD-10-CM | POA: Diagnosis present

## 2022-11-08 DIAGNOSIS — Z1152 Encounter for screening for COVID-19: Secondary | ICD-10-CM | POA: Diagnosis not present

## 2022-11-08 DIAGNOSIS — A419 Sepsis, unspecified organism: Principal | ICD-10-CM | POA: Diagnosis present

## 2022-11-08 DIAGNOSIS — Z85118 Personal history of other malignant neoplasm of bronchus and lung: Secondary | ICD-10-CM

## 2022-11-08 DIAGNOSIS — Z85048 Personal history of other malignant neoplasm of rectum, rectosigmoid junction, and anus: Secondary | ICD-10-CM

## 2022-11-08 DIAGNOSIS — Z9049 Acquired absence of other specified parts of digestive tract: Secondary | ICD-10-CM

## 2022-11-08 DIAGNOSIS — Z79899 Other long term (current) drug therapy: Secondary | ICD-10-CM

## 2022-11-08 DIAGNOSIS — I10 Essential (primary) hypertension: Secondary | ICD-10-CM | POA: Diagnosis present

## 2022-11-08 DIAGNOSIS — J309 Allergic rhinitis, unspecified: Secondary | ICD-10-CM | POA: Diagnosis present

## 2022-11-08 DIAGNOSIS — Z9221 Personal history of antineoplastic chemotherapy: Secondary | ICD-10-CM | POA: Diagnosis not present

## 2022-11-08 DIAGNOSIS — Z7982 Long term (current) use of aspirin: Secondary | ICD-10-CM

## 2022-11-08 DIAGNOSIS — K219 Gastro-esophageal reflux disease without esophagitis: Secondary | ICD-10-CM | POA: Diagnosis present

## 2022-11-08 DIAGNOSIS — Z933 Colostomy status: Secondary | ICD-10-CM

## 2022-11-08 DIAGNOSIS — Z8249 Family history of ischemic heart disease and other diseases of the circulatory system: Secondary | ICD-10-CM

## 2022-11-08 DIAGNOSIS — R1084 Generalized abdominal pain: Principal | ICD-10-CM

## 2022-11-08 DIAGNOSIS — Z86718 Personal history of other venous thrombosis and embolism: Secondary | ICD-10-CM

## 2022-11-08 LAB — CBC WITH DIFFERENTIAL/PLATELET
Abs Immature Granulocytes: 0.11 10*3/uL — ABNORMAL HIGH (ref 0.00–0.07)
Basophils Absolute: 0.1 10*3/uL (ref 0.0–0.1)
Basophils Relative: 0 %
Eosinophils Absolute: 0 10*3/uL (ref 0.0–0.5)
Eosinophils Relative: 0 %
HCT: 51.7 % (ref 39.0–52.0)
Hemoglobin: 17.6 g/dL — ABNORMAL HIGH (ref 13.0–17.0)
Immature Granulocytes: 1 %
Lymphocytes Relative: 4 %
Lymphs Abs: 0.7 10*3/uL (ref 0.7–4.0)
MCH: 34.5 pg — ABNORMAL HIGH (ref 26.0–34.0)
MCHC: 34 g/dL (ref 30.0–36.0)
MCV: 101.4 fL — ABNORMAL HIGH (ref 80.0–100.0)
Monocytes Absolute: 0.8 10*3/uL (ref 0.1–1.0)
Monocytes Relative: 4 %
Neutro Abs: 17.3 10*3/uL — ABNORMAL HIGH (ref 1.7–7.7)
Neutrophils Relative %: 91 %
Platelets: 168 10*3/uL (ref 150–400)
RBC: 5.1 MIL/uL (ref 4.22–5.81)
RDW: 14.5 % (ref 11.5–15.5)
Smear Review: NORMAL
WBC Morphology: INCREASED
WBC: 19 10*3/uL — ABNORMAL HIGH (ref 4.0–10.5)
nRBC: 0 % (ref 0.0–0.2)

## 2022-11-08 LAB — URINALYSIS, ROUTINE W REFLEX MICROSCOPIC
Bacteria, UA: NONE SEEN
Bilirubin Urine: NEGATIVE
Glucose, UA: NEGATIVE mg/dL
Hgb urine dipstick: NEGATIVE
Ketones, ur: NEGATIVE mg/dL
Nitrite: NEGATIVE
Protein, ur: >=300 mg/dL — AB
Specific Gravity, Urine: 1.015 (ref 1.005–1.030)
Squamous Epithelial / HPF: NONE SEEN (ref 0–5)
pH: 8 (ref 5.0–8.0)

## 2022-11-08 LAB — COMPREHENSIVE METABOLIC PANEL
ALT: 55 U/L — ABNORMAL HIGH (ref 0–44)
AST: 167 U/L — ABNORMAL HIGH (ref 15–41)
Albumin: 4 g/dL (ref 3.5–5.0)
Alkaline Phosphatase: 101 U/L (ref 38–126)
Anion gap: 15 (ref 5–15)
BUN: 15 mg/dL (ref 8–23)
CO2: 22 mmol/L (ref 22–32)
Calcium: 9.3 mg/dL (ref 8.9–10.3)
Chloride: 102 mmol/L (ref 98–111)
Creatinine, Ser: 1.3 mg/dL — ABNORMAL HIGH (ref 0.61–1.24)
GFR, Estimated: 57 mL/min — ABNORMAL LOW (ref >60)
Glucose, Bld: 232 mg/dL — ABNORMAL HIGH (ref 70–99)
Potassium: 3.8 mmol/L (ref 3.5–5.1)
Sodium: 139 mmol/L (ref 135–145)
Total Bilirubin: 3.7 mg/dL — ABNORMAL HIGH (ref 0.3–1.2)
Total Protein: 8.8 g/dL — ABNORMAL HIGH (ref 6.5–8.1)

## 2022-11-08 LAB — RESP PANEL BY RT-PCR (RSV, FLU A&B, COVID)  RVPGX2
Influenza A by PCR: NEGATIVE
Influenza B by PCR: NEGATIVE
Resp Syncytial Virus by PCR: NEGATIVE
SARS Coronavirus 2 by RT PCR: NEGATIVE

## 2022-11-08 LAB — LIPASE, BLOOD: Lipase: 34 U/L (ref 11–51)

## 2022-11-08 LAB — LACTIC ACID, PLASMA
Lactic Acid, Venous: 3.5 mmol/L (ref 0.5–1.9)
Lactic Acid, Venous: 5.6 mmol/L (ref 0.5–1.9)
Lactic Acid, Venous: 3.6 mmol/L (ref 0.5–1.9)
Lactic Acid, Venous: 3.6 mmol/L (ref 0.5–1.9)
Lactic Acid, Venous: 1.4 mmol/L (ref 0.5–1.9)

## 2022-11-08 LAB — PROCALCITONIN: Procalcitonin: <0.1 ng/mL

## 2022-11-08 LAB — TROPONIN I (HIGH SENSITIVITY)
Troponin I (High Sensitivity): 13 ng/L (ref <18)
Troponin I (High Sensitivity): 20 ng/L — ABNORMAL HIGH (ref <18)

## 2022-11-08 MED ORDER — METRONIDAZOLE 500 MG/100ML IV SOLN
500.0000 mg | Freq: Two times a day (BID) | INTRAVENOUS | Status: DC
  Administered 2022-11-08: 500 mg via INTRAVENOUS
  Filled 2022-11-08 (×2): qty 100

## 2022-11-08 MED ORDER — FENTANYL CITRATE PF 50 MCG/ML IJ SOSY
50.0000 ug | PREFILLED_SYRINGE | Freq: Once | INTRAMUSCULAR | Status: AC
  Administered 2022-11-08: 50 ug via INTRAVENOUS
  Filled 2022-11-08: qty 1

## 2022-11-08 MED ORDER — VANCOMYCIN HCL IN DEXTROSE 1-5 GM/200ML-% IV SOLN: 1000.0000 mg | INTRAVENOUS | Status: DC

## 2022-11-08 MED ORDER — SODIUM CHLORIDE 0.9 % IV SOLN
2.0000 g | Freq: Two times a day (BID) | INTRAVENOUS | Status: DC
  Administered 2022-11-08 – 2022-11-09 (×2): 2 g via INTRAVENOUS
  Filled 2022-11-08 (×2): qty 12.5

## 2022-11-08 MED ORDER — IOHEXOL 300 MG/ML  SOLN
100.0000 mL | Freq: Once | INTRAMUSCULAR | Status: AC | PRN
  Administered 2022-11-08: 100 mL via INTRAVENOUS

## 2022-11-08 MED ORDER — VANCOMYCIN HCL IN DEXTROSE 1-5 GM/200ML-% IV SOLN
1000.0000 mg | Freq: Once | INTRAVENOUS | Status: AC
  Administered 2022-11-08: 1000 mg via INTRAVENOUS
  Filled 2022-11-08: qty 200

## 2022-11-08 MED ORDER — SODIUM CHLORIDE 0.9 % IV SOLN
2.0000 g | Freq: Once | INTRAVENOUS | Status: AC
  Administered 2022-11-08: 2 g via INTRAVENOUS
  Filled 2022-11-08: qty 12.5

## 2022-11-08 MED ORDER — SODIUM CHLORIDE 0.9 % IV BOLUS (SEPSIS)
1000.0000 mL | Freq: Once | INTRAVENOUS | Status: AC
  Administered 2022-11-08: 1000 mL via INTRAVENOUS

## 2022-11-08 MED ORDER — METRONIDAZOLE 500 MG/100ML IV SOLN
500.0000 mg | Freq: Once | INTRAVENOUS | Status: AC
  Administered 2022-11-08: 500 mg via INTRAVENOUS
  Filled 2022-11-08: qty 100

## 2022-11-08 MED ORDER — SODIUM CHLORIDE 0.9 % IV SOLN: INTRAVENOUS | Status: DC

## 2022-11-08 NOTE — ED Provider Notes (Signed)
-----------------------------------------   8:59 AM on 11/08/2022 ----------------------------------------- Patient has received 30 mL/kg of IV fluids.  We will recheck a third lactate as the patient's second lactate was uptrending.  I have spoken to [facility] they have accepted the patient under Dr. [name] service for further care and workup.  We will arrange for transport once a bed is assigned.   Harvest Dark, MD 11/08/22 1418

## 2022-11-08 NOTE — ED Notes (Signed)
[facility] called and spoke [place] who states that we are still waiting on a bed assignment

## 2022-11-08 NOTE — ED Notes (Signed)
The pt advised the fentanyl had helped with his pain a lot.

## 2022-11-08 NOTE — ED Notes (Signed)
Lactic sent

## 2022-11-08 NOTE — Consult Note (Signed)
Pharmacy Antibiotic Note  Stephen [name] is a 76 y.o. male admitted on 11/08/2022 with sepsis.  Pharmacy has been consulted for vancomycin and cefepime dosing.  Patient presented to ED with abdominal pain and distention.  Concern is for choledocholithiasis.  Patient has been accepted for transfer to [facility].  Plan: Give additional vancomycin 1 gram IV x 1 to complete 2 gram loading dose Initiate Vancomycin 1 gram IV every 24 hours Initiate Cefepime 2 grams IV every 12 hours Vancomycin levels as clinically appropriate.    Temp (24hrs), Avg:97.7 F (36.5 C), Min:97.5 F (36.4 C), Max:97.9 F (36.6 C)  Recent Labs  Lab 11/08/22 0447 11/08/22 0623 11/08/22 0954 11/08/22 1200  WBC 19.0*  --   --   --   CREATININE 1.30*  --   --   --   LATICACIDVEN 3.5* 5.6* 3.6* 3.6*    CrCl cannot be calculated (Unknown ideal weight.).    No Known Allergies  Antimicrobials this admission: Vancomycin  12/26 >>  Cefepime 12/26 >>  Flagyl 12/26 >>  Dose adjustments this admission: N/A  Microbiology results: 12/26 BCx: ngtd 12/26 UCx: pending   Thank you for allowing pharmacy to be a part of this patient's care.  Lorin Picket 11/08/2022 2:31 PM

## 2022-11-08 NOTE — ED Notes (Signed)
[facility] called to check status and per Marcello Moores Dr [name] did accept and pt is on the admit list awaiting a bed

## 2022-11-08 NOTE — ED Notes (Signed)
Patient states pain still 5/10, requesting more pain medicine at this time. MD Paduchowski notified.

## 2022-11-08 NOTE — ED Provider Notes (Signed)
[facility] Provider Note    Event Date/Time   First MD Initiated Contact with Patient 11/08/22 [phone]     (approximate)   History   Sepsis   HPI  Level V caveat: Limited by distress  Stephen [name] is a 76 y.o. male brought to the ED via EMS from home with a chief complaint of sepsis alert.  EMS called out for abdominal pain.  Patient with indwelling Foley catheter and colostomy bag in place.  Sepsis alert based on vital signs per EMS.  Patient endorses cough.  Denies chest pain, shortness of breath, nausea, vomiting or diarrhea.     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 [facility]) 09-2013   surgery and chemo   Smoker      Active Problem List   Patient Active Problem List   Diagnosis Date Noted   Sepsis ([place]) 11/08/2022   Pressure injury of skin 03/06/2021   Acute on chronic respiratory failure with hypoxia ([place]) 03/05/2021   Malnutrition of moderate degree 02/20/2021   Alcohol withdrawal syndrome ([place]) 02/20/2021   Aspiration into lower respiratory tract, sequela 02/19/2021   UTI (urinary tract infection) 02/19/2021   Acute respiratory failure with hypoxia ([place]) 02/19/2021   Aspiration pneumonia ([place]) 02/19/2021   Partial small bowel obstruction ([place]) 02/19/2021   Severe sepsis ([place]) 02/19/2021   GERD (gastroesophageal reflux disease) 02/19/2021   Depression 02/19/2021   Alcohol use 02/19/2021   Rectal cancer ([place]) 11/15/2013   History of anemia    HTN (hypertension)    Smoker      Past Surgical History   Past Surgical History:  Procedure Laterality Date   COLON RESECTION  2015   COLONOSCOPY  2015   EUS N/A 11/15/2013   Procedure: LOWER ENDOSCOPIC ULTRASOUND (EUS);  Surgeon: Milus Banister, MD;   Location: [facility];  Service: Endoscopy;  Laterality: N/A;   LUNG BIOPSY     LUNG LOBECTOMY Left    lung removed  2015   left lower    PLANTAR FASCIA RELEASE Right 09/20/2017   Procedure: PLANTAR FASCIA [id number], excision plantar fibroma right foot;  Surgeon: Samara Deist, DPM;  Location: [place];  Service: Podiatry;  Laterality: Right;   TONSILLECTOMY AND ADENOIDECTOMY  Childhood   VARICOSE VEIN SURGERY  2010     Home Medications   Prior to Admission medications   Medication Sig Start Date End Date Taking? Authorizing Provider  albuterol (PROVENTIL) (2.5 MG/3ML) 0.083% nebulizer solution Take 3 mLs (2.5 mg total) by nebulization every 4 (four) hours as needed for wheezing or shortness of breath. 03/08/21   Fritzi Mandes, MD  aspirin EC 81 MG tablet Take 81 mg by mouth daily.    [provider]  esomeprazole (NEXIUM) 20 MG packet Take 20 mg by mouth daily before breakfast.    [provider]  gabapentin (NEURONTIN) 100 MG capsule Take 2 capsules (200 mg total) by mouth 2 (two) times daily. 02/24/21 04/25/21  Bonnielee Haff, MD  loratadine (CLARITIN) 10 MG tablet Take 10 mg by mouth daily as needed.    [provider]  Multiple Vitamin (MULTIVITAMIN WITH MINERALS) TABS tablet  Take 1 tablet by mouth daily. 02/25/21   Bonnielee Haff, MD  oxyCODONE-acetaminophen (PERCOCET) 7.5-325 MG tablet Take 1 tablet by mouth every 6 (six) hours as needed for severe pain. 06/24/20   Sable Feil, PA-C  vitamin B-12 (CYANOCOBALAMIN) 100 MCG tablet Take 100 mcg by mouth daily.    [provider]  vitamin C (ASCORBIC ACID) 250 MG tablet Take 250 mg by mouth daily.    [provider]  Vitamin D, Cholecalciferol, 25 MCG (1000 UT) TABS Take 1,000 Units by mouth daily.    [provider]     Allergies  Patient has no known allergies.   Family History   Family History  Problem Relation Age of Onset   Hypertension Mother    Coronary  artery disease Mother        angina   Cancer Mother        breast   Cancer Father 61       esophageal   COPD Brother        emphysema   Cancer Paternal Grandmother        colon   Diabetes Neg Hx      Physical Exam  Triage Vital Signs: ED Triage Vitals  Enc Vitals Group     BP      Pulse      Resp      Temp      Temp src      SpO2      Weight      Height      Head Circumference      Peak Flow      Pain Score      Pain Loc      Pain Edu?      Excl. in [place]?     Updated Vital Signs: BP (!) 182/98   Pulse (!) 103   Temp 97.8 F (36.6 C)   Resp 15   SpO2 94%    General: Awake, mild to moderate distress.  CV:  RRR.  Good peripheral perfusion.  Resp:  Normal effort. CTAB. Abd:  Mild diffuse tenderness to palpation without rebound or guarding.  Mild to moderate distention.  Small amount of stool in ostomy bag.  Cloudy urine in Foley. Other:  No CVAT.  BLE contractures.   ED Results / Procedures / Treatments  Labs (all labs ordered are listed, but only abnormal results are displayed) Labs Reviewed  LACTIC ACID, PLASMA - Abnormal; Notable for the following components:      Result Value   Lactic Acid, Venous 3.5 (*)    All other components within normal limits  CBC WITH DIFFERENTIAL/PLATELET - Abnormal; Notable for the following components:   WBC 19.0 (*)    Hemoglobin 17.6 (*)    MCV 101.4 (*)    MCH 34.5 (*)    Neutro Abs 17.3 (*)    Abs Immature Granulocytes 0.11 (*)    All other components within normal limits  COMPREHENSIVE METABOLIC PANEL - Abnormal; Notable for the following components:   Glucose, Bld 232 (*)    Creatinine, Ser 1.30 (*)    Total Protein 8.8 (*)    AST 167 (*)    ALT 55 (*)    Total Bilirubin 3.7 (*)    GFR, Estimated 57 (*)    All other components within normal limits  URINALYSIS, ROUTINE W REFLEX MICROSCOPIC - Abnormal; Notable for the following components:   Color, Urine YELLOW (*)    APPearance  TURBID (*)    Protein, ur >=300  (*)    Leukocytes,Ua MODERATE (*)    All other components within normal limits  CULTURE, BLOOD (ROUTINE X 2)  RESP PANEL BY RT-PCR (RSV, FLU A&B, COVID)  RVPGX2  CULTURE, BLOOD (ROUTINE X 2)  URINE CULTURE  LIPASE, BLOOD  PROCALCITONIN  LACTIC ACID, PLASMA  TROPONIN I (HIGH SENSITIVITY)  TROPONIN I (HIGH SENSITIVITY)     EKG  ED ECG REPORT I, Cache Bills J, the attending physician, personally viewed and interpreted this ECG.   Date: 11/08/2022  EKG Time: 0443  Rate: 100  Rhythm: sinus tachycardia  Axis: Normal  Intervals:none  ST&T Change: Nonspecific    RADIOLOGY I have independently visualized and interpreted patient's CT head, abdomen/pelvis and chest x-ray as well as noted the radiology interpretation:  CT head: No ICH  CT abdomen/pelvis: Choledocholithiasis  Chest x-ray: Chronic interstitial prominence  Official radiology report(s): CT Abdomen Pelvis W Contrast  Result Date: 11/08/2022 CLINICAL DATA:  76 year old male with history of acute onset of nonlocalized abdominal pain radiating into the right side. Emesis. History of rectal cancer. * Tracking Code: BO * EXAM: CT ABDOMEN AND PELVIS WITH CONTRAST TECHNIQUE: Multidetector CT imaging of the abdomen and pelvis was performed using the standard protocol following bolus administration of intravenous contrast. RADIATION DOSE REDUCTION: This exam was performed according to the departmental dose-optimization program which includes automated exposure control, adjustment of the mA and/or kV according to patient size and/or use of iterative reconstruction technique. CONTRAST:  178m OMNIPAQUE IOHEXOL 300 MG/ML  SOLN COMPARISON:  CT of the abdomen and pelvis 03/05/2021. FINDINGS: Lower chest: Linear scarring or atelectasis in the right lower lobe. Small hiatal hernia. Atherosclerotic calcifications in the descending thoracic aorta as well as the right coronary artery. Hepatobiliary: Subcentimeter low-attenuation lesion in  segment 4A of the liver, too small to characterize, but similar to the prior study and statistically likely tiny cysts (no imaging follow-up recommended). No other aggressive appearing hepatic lesions are noted. No intrahepatic biliary ductal dilatation. Common bile duct is dilated measuring 1 cm in the porta hepatis. There are multiple high attenuation stones in the distal common bile duct, largest of which measures up to 8 mm (coronal image 52 of series 5). Gallbladder is moderately distended with several small calcified gallstones lying dependently. Gallbladder wall thickness is normal. No pericholecystic fluid or surrounding inflammatory changes. Pancreas: No pancreatic mass. No pancreatic ductal dilatation noted on MRCP images. No pancreatic or peripancreatic fluid collections or inflammatory changes. Spleen: Unremarkable. Adrenals/Urinary Tract: Low-attenuation lesions in the right kidney measuring up to 2.2 cm in the posterior aspect of the upper pole, compatible with simple cysts (Bosniak class 1, no imaging follow-up recommended). Left kidney and bilateral adrenal glands are otherwise unremarkable in appearance. No hydroureteronephrosis. Urinary bladder is largely decompressed, but otherwise unremarkable in appearance. Stomach/Bowel: The appearance of the stomach is unremarkable. There is no pathologic dilatation of small bowel or colon. Status post low anterior resection with left lower quadrant colostomy. Normal appendix. Vascular/Lymphatic: Atherosclerosis throughout the abdominal aorta and pelvic vasculature, without evidence of aneurysm or dissection. No lymphadenopathy noted in the abdomen or pelvis. Reproductive: Prostate gland and seminal vesicles are unremarkable in appearance. Other: No significant volume of ascites.  No pneumoperitoneum. Musculoskeletal: Chronic compression fractures of L4 and L5 are again noted, most severe at L4 where there is near complete loss of central vertebral body  height, similar to the prior study. There are no aggressive appearing lytic or blastic lesions  noted in the visualized portions of the skeleton. IMPRESSION: 1. Study is positive for obstructive choledocholithiasis with common bile duct measuring up to 1 cm in diameter. No intrahepatic biliary ductal dilatation is noted at this time. 2. Cholelithiasis without evidence of acute cholecystitis. 3. No definite signs of tumor recurrence or metastatic disease in the abdomen or pelvis. 4. Aortic atherosclerosis, in addition to at least right coronary artery disease. Assessment for potential risk factor modification, dietary therapy or pharmacologic therapy may be warranted, if clinically indicated. 5. Small hiatal hernia. 6. Additional incidental findings, as above. Electronically Signed   By: Vinnie Langton M.D.   On: 11/08/2022 06:29   CT Head Wo Contrast  Result Date: 11/08/2022 CLINICAL DATA:  Mental status change with unknown cause EXAM: CT HEAD WITHOUT CONTRAST TECHNIQUE: Contiguous axial images were obtained from the base of the skull through the vertex without intravenous contrast. RADIATION DOSE REDUCTION: This exam was performed according to the departmental dose-optimization program which includes automated exposure control, adjustment of the mA and/or kV according to patient size and/or use of iterative reconstruction technique. COMPARISON:  03/05/2021 FINDINGS: Brain: No evidence of acute infarction, hemorrhage, hydrocephalus, extra-axial collection or mass lesion/mass effect. Generalized cortical atrophy. Vascular: No hyperdense vessel or unexpected calcification. Skull: Normal. Negative for fracture or focal lesion. Sinuses/Orbits: No acute finding. IMPRESSION: No acute or reversible finding. Generalized brain atrophy. Electronically Signed   By: Jorje Guild M.D.   On: 11/08/2022 06:21   DG Chest Port 1 View  Result Date: 11/08/2022 CLINICAL DATA:  76 year old male with history of sepsis.  EXAM: PORTABLE CHEST 1 VIEW COMPARISON:  Chest x-ray 03/05/2021. FINDINGS: Lung volumes are low. Mild elevation of the right hemidiaphragm. Mild diffuse interstitial prominence. No consolidative airspace disease. No pleural effusions. No pneumothorax. Nodular density projecting over the left mid lung estimated measure 2.5 x 1.6 cm. Pulmonary vasculature and the cardiomediastinal silhouette are within normal limits. Atherosclerotic calcifications in the thoracic aorta. IMPRESSION: 1. Diffuse interstitial prominence, similar to prior studies, favored to be chronic. 2. New nodular density in the left mid lung concerning for potential neoplasm. Further evaluation with noncontrast chest CT should be considered. 3. Aortic atherosclerosis. Electronically Signed   By: Vinnie Langton M.D.   On: 11/08/2022 05:20     PROCEDURES:  Critical Care performed: Yes, see critical care procedure note(s)  CRITICAL CARE Performed by: Paulette Blanch   Total critical care time: 45 minutes  Critical care time was exclusive of separately billable procedures and treating other patients.  Critical care was necessary to treat or prevent imminent or life-threatening deterioration.  Critical care was time spent personally by me on the following activities: development of treatment plan with patient and/or surrogate as well as nursing, discussions with consultants, evaluation of patient's response to treatment, examination of patient, obtaining history from patient or surrogate, ordering and performing treatments and interventions, ordering and review of laboratory studies, ordering and review of radiographic studies, pulse oximetry and re-evaluation of patient's condition.   Marland Kitchen1-3 Lead EKG Interpretation  Performed by: Paulette Blanch, MD Authorized by: Paulette Blanch, MD     Interpretation: normal     ECG rate:  97   ECG rate assessment: normal     Rhythm: sinus rhythm     Ectopy: none     Conduction: normal   Comments:      Patient placed on cardiac monitor to evaluate for arrhythmias    MEDICATIONS ORDERED IN ED: Medications  sodium chloride 0.9 %  bolus 1,000 mL (1,000 mLs Intravenous New Bag/Given 11/08/22 0550)    And  sodium chloride 0.9 % bolus 1,000 mL (has no administration in time range)    And  sodium chloride 0.9 % bolus 1,000 mL (has no administration in time range)  metroNIDAZOLE (FLAGYL) IVPB 500 mg (500 mg Intravenous New Bag/Given 11/08/22 0640)  vancomycin (VANCOCIN) IVPB 1000 mg/200 mL premix (has no administration in time range)  ceFEPIme (MAXIPIME) 2 g in sodium chloride 0.9 % 100 mL IVPB (2 g Intravenous New Bag/Given 11/08/22 0624)  iohexol (OMNIPAQUE) 300 MG/ML solution 100 mL (100 mLs Intravenous Contrast Given 11/08/22 0559)     IMPRESSION / MDM / ASSESSMENT AND PLAN / ED COURSE  I reviewed the triage vital signs and the nursing notes.                             76 year old male presenting with abdominal pain, sepsis alert. Differential diagnosis includes, but is not limited to, acute appendicitis, renal colic, testicular torsion, urinary tract infection/pyelonephritis, prostatitis,  epididymitis, diverticulitis, small bowel obstruction or ileus, colitis, abdominal aortic aneurysm, gastroenteritis, hernia, etc. I have personally reviewed patient's records and note last hospitalization at this facility 02/2021 for acute respiratory failure with hypoxia secondary to pneumonia.  Patient's presentation is most consistent with acute presentation with potential threat to life or bodily function.  The patient is on the cardiac monitor to evaluate for evidence of arrhythmia and/or significant heart rate changes.  Will obtain ED code sepsis protocol lab work and UA, chest x-ray.  Obtain CT head for altered mentation, CT abdomen/pelvis as patient has had prior history of partial SBO and is complaining of abdominal pain.  Will reassess.  Anticipate hospitalization.  Clinical Course as of  11/08/22 0707  Tue Nov 08, 2022  0542 Lactate 3.5; will initiate ED Code Sepsis protocol. Dirty UA from Foley, culture added. [JS]  [id number] CT head negative for ICH.  CT abdomen pelvis positive for choledocholithiasis; will discuss with GI on-call. [JS]  [id number] Spoke with Dr. Vicente Males from GI who does not recommend additional imaging with [place] or MRCP; unfortunately ERCP will not be available either today or Wednesday so he recommends transfer.  Updated patient and spoke with his wife via telephone; both agreeable to transfer to [facility]. [JS]  [id number] Care transferred to Dr. Kerman Passey pending callback from [place]. [JS]    Clinical Course User Index [JS] Paulette Blanch, MD     FINAL CLINICAL IMPRESSION(S) / ED DIAGNOSES   Final diagnoses:  Generalized abdominal pain  Sepsis, due to unspecified organism, unspecified whether acute organ dysfunction present [facility])  Cholelithiasis with choledocholithiasis     Rx / DC Orders   ED Discharge Orders     None        Note:  This document was prepared using Dragon voice recognition software and may include unintentional dictation errors.   Paulette Blanch, MD 11/08/22 [phone]

## 2022-11-08 NOTE — ED Triage Notes (Signed)
Pt arrived via [facility] from home where he is a total care by spouse who reports pt has had increased abdominal pain as well as decrease in ostomy output. Pt reports nausea on arrival with SOB and abdominal pain. Ostomy and foley catheter in place on arrival. Abdomen distended with no evidence of bowl in ostomy bag.

## 2022-11-08 NOTE — ED Notes (Signed)
Called [facility] for possible transfer per Dr. Beather Arbour, spoke to [facility]

## 2022-11-08 NOTE — Progress Notes (Signed)
Plan of Care Note for accepted transfer   Patient: Aaidyn San MRN: [id number]   [place]: 11/08/2022  Facility requesting transfer: [facility] Requesting Provider: Beather Arbour Reason for transfer: Choledocholithiasis  Facility course: Patient with h/o HTN and metastatic rectal cancer (2014) with foley and colostomy presenting with abdominal pain, sepsis alert.  Abdominal pain, distention.  Lactate 3.5 -> 5.6, WBC 19k.  Sepsis, source appears to be choledocholithiasis.  AST 167, ALT 55, Bili 3.7.  Concern for cholangitis.  If ok with GI and they have the capacity for urgent ERCP, will accept to med tele at this time. Dr. Lorenso Courier has accepted the patient on behalf of Dr. Henrene Pastor.  Given the urgency, he may need to come to short stay for ERCP while awaiting a bed.   Plan of care: The patient is accepted for admission to Telemetry unit, at [facility].   Author: Karmen Bongo, MD 11/08/2022  Check [email or web address] for on-call coverage.  Nursing staff, Please call [place] number on Amion as soon as patient's arrival, so appropriate admitting provider can evaluate the pt.

## 2022-11-08 NOTE — ED Notes (Signed)
15 yom lying supine in the bed with his head slightly elevated. The pt was warm, pink, and dry. The pt was alert and oriented to all questions. The pt advised he was here for abdominal pain. Pt's medication was started and his foley bag was emptied, 900 cc's. The pt advised he is having some abdominal pain, rating pain at a 7. ED provider notified.

## 2022-11-09 ENCOUNTER — Inpatient Hospital Stay (HOSPITAL_COMMUNITY): Payer: Medicare HMO

## 2022-11-09 ENCOUNTER — Encounter (HOSPITAL_COMMUNITY): Payer: Self-pay | Admitting: Internal Medicine

## 2022-11-09 ENCOUNTER — Inpatient Hospital Stay (HOSPITAL_COMMUNITY)
Admission: RE | Admit: 2022-11-09 | Discharge: 2022-11-17 | DRG: 444 | Disposition: A | Discharge status: Home Health | Payer: Medicare HMO | Source: Ambulatory Visit | Attending: Internal Medicine | Admitting: Internal Medicine

## 2022-11-09 DIAGNOSIS — E871 Hypo-osmolality and hyponatremia: Secondary | ICD-10-CM | POA: Diagnosis not present

## 2022-11-09 DIAGNOSIS — Z800 Family history of malignant neoplasm of digestive organs: Secondary | ICD-10-CM

## 2022-11-09 DIAGNOSIS — J189 Pneumonia, unspecified organism: Secondary | ICD-10-CM | POA: Diagnosis present

## 2022-11-09 DIAGNOSIS — K760 Fatty (change of) liver, not elsewhere classified: Secondary | ICD-10-CM | POA: Diagnosis present

## 2022-11-09 DIAGNOSIS — K3189 Other diseases of stomach and duodenum: Secondary | ICD-10-CM | POA: Diagnosis not present

## 2022-11-09 DIAGNOSIS — X58XXXA Exposure to other specified factors, initial encounter: Secondary | ICD-10-CM | POA: Diagnosis present

## 2022-11-09 DIAGNOSIS — M069 Rheumatoid arthritis, unspecified: Secondary | ICD-10-CM | POA: Diagnosis present

## 2022-11-09 DIAGNOSIS — M6250 Muscle wasting and atrophy, not elsewhere classified, unspecified site: Secondary | ICD-10-CM | POA: Diagnosis present

## 2022-11-09 DIAGNOSIS — F1010 Alcohol abuse, uncomplicated: Secondary | ICD-10-CM | POA: Diagnosis present

## 2022-11-09 DIAGNOSIS — M7981 Nontraumatic hematoma of soft tissue: Secondary | ICD-10-CM | POA: Diagnosis present

## 2022-11-09 DIAGNOSIS — N390 Urinary tract infection, site not specified: Secondary | ICD-10-CM | POA: Diagnosis present

## 2022-11-09 DIAGNOSIS — K2100 Gastro-esophageal reflux disease with esophagitis, without bleeding: Secondary | ICD-10-CM | POA: Diagnosis not present

## 2022-11-09 DIAGNOSIS — Z9221 Personal history of antineoplastic chemotherapy: Secondary | ICD-10-CM

## 2022-11-09 DIAGNOSIS — K8063 Calculus of gallbladder and bile duct with acute cholecystitis with obstruction: Principal | ICD-10-CM | POA: Diagnosis present

## 2022-11-09 DIAGNOSIS — Z803 Family history of malignant neoplasm of breast: Secondary | ICD-10-CM

## 2022-11-09 DIAGNOSIS — Z539 Procedure and treatment not carried out, unspecified reason: Secondary | ICD-10-CM | POA: Diagnosis present

## 2022-11-09 DIAGNOSIS — K810 Acute cholecystitis: Secondary | ICD-10-CM | POA: Diagnosis present

## 2022-11-09 DIAGNOSIS — S27813A Laceration of esophagus (thoracic part), initial encounter: Secondary | ICD-10-CM | POA: Diagnosis present

## 2022-11-09 DIAGNOSIS — Z87891 Personal history of nicotine dependence: Secondary | ICD-10-CM | POA: Diagnosis not present

## 2022-11-09 DIAGNOSIS — K449 Diaphragmatic hernia without obstruction or gangrene: Secondary | ICD-10-CM | POA: Diagnosis not present

## 2022-11-09 DIAGNOSIS — R7989 Other specified abnormal findings of blood chemistry: Secondary | ICD-10-CM | POA: Diagnosis not present

## 2022-11-09 DIAGNOSIS — Z825 Family history of asthma and other chronic lower respiratory diseases: Secondary | ICD-10-CM

## 2022-11-09 DIAGNOSIS — I700 Atherosclerosis of aorta: Secondary | ICD-10-CM | POA: Diagnosis present

## 2022-11-09 DIAGNOSIS — J9601 Acute respiratory failure with hypoxia: Secondary | ICD-10-CM | POA: Diagnosis present

## 2022-11-09 DIAGNOSIS — R1319 Other dysphagia: Secondary | ICD-10-CM | POA: Diagnosis not present

## 2022-11-09 DIAGNOSIS — I5032 Chronic diastolic (congestive) heart failure: Secondary | ICD-10-CM | POA: Diagnosis present

## 2022-11-09 DIAGNOSIS — Z7401 Bed confinement status: Secondary | ICD-10-CM

## 2022-11-09 DIAGNOSIS — D6959 Other secondary thrombocytopenia: Secondary | ICD-10-CM | POA: Diagnosis present

## 2022-11-09 DIAGNOSIS — M4856XA Collapsed vertebra, not elsewhere classified, lumbar region, initial encounter for fracture: Secondary | ICD-10-CM | POA: Diagnosis present

## 2022-11-09 DIAGNOSIS — Z933 Colostomy status: Secondary | ICD-10-CM | POA: Diagnosis not present

## 2022-11-09 DIAGNOSIS — K209 Esophagitis, unspecified without bleeding: Secondary | ICD-10-CM | POA: Diagnosis present

## 2022-11-09 DIAGNOSIS — E872 Acidosis, unspecified: Secondary | ICD-10-CM | POA: Diagnosis not present

## 2022-11-09 DIAGNOSIS — G629 Polyneuropathy, unspecified: Secondary | ICD-10-CM | POA: Diagnosis present

## 2022-11-09 DIAGNOSIS — K8043 Calculus of bile duct with acute cholecystitis with obstruction: Secondary | ICD-10-CM | POA: Diagnosis present

## 2022-11-09 DIAGNOSIS — K8050 Calculus of bile duct without cholangitis or cholecystitis without obstruction: Secondary | ICD-10-CM | POA: Diagnosis not present

## 2022-11-09 DIAGNOSIS — Z7982 Long term (current) use of aspirin: Secondary | ICD-10-CM

## 2022-11-09 DIAGNOSIS — L299 Pruritus, unspecified: Secondary | ICD-10-CM | POA: Diagnosis present

## 2022-11-09 DIAGNOSIS — Z85118 Personal history of other malignant neoplasm of bronchus and lung: Secondary | ICD-10-CM

## 2022-11-09 DIAGNOSIS — Z66 Do not resuscitate: Secondary | ICD-10-CM | POA: Diagnosis present

## 2022-11-09 DIAGNOSIS — R935 Abnormal findings on diagnostic imaging of other abdominal regions, including retroperitoneum: Secondary | ICD-10-CM | POA: Diagnosis not present

## 2022-11-09 DIAGNOSIS — I509 Heart failure, unspecified: Secondary | ICD-10-CM | POA: Diagnosis not present

## 2022-11-09 DIAGNOSIS — Z0189 Encounter for other specified special examinations: Secondary | ICD-10-CM

## 2022-11-09 DIAGNOSIS — I110 Hypertensive heart disease with heart failure: Secondary | ICD-10-CM | POA: Diagnosis present

## 2022-11-09 DIAGNOSIS — L538 Other specified erythematous conditions: Secondary | ICD-10-CM | POA: Diagnosis present

## 2022-11-09 DIAGNOSIS — M546 Pain in thoracic spine: Secondary | ICD-10-CM | POA: Diagnosis present

## 2022-11-09 DIAGNOSIS — N179 Acute kidney failure, unspecified: Secondary | ICD-10-CM | POA: Diagnosis not present

## 2022-11-09 DIAGNOSIS — K222 Esophageal obstruction: Secondary | ICD-10-CM

## 2022-11-09 DIAGNOSIS — I2510 Atherosclerotic heart disease of native coronary artery without angina pectoris: Secondary | ICD-10-CM | POA: Diagnosis present

## 2022-11-09 DIAGNOSIS — Z85048 Personal history of other malignant neoplasm of rectum, rectosigmoid junction, and anus: Secondary | ICD-10-CM

## 2022-11-09 DIAGNOSIS — I10 Essential (primary) hypertension: Secondary | ICD-10-CM | POA: Diagnosis present

## 2022-11-09 DIAGNOSIS — Z79899 Other long term (current) drug therapy: Secondary | ICD-10-CM

## 2022-11-09 DIAGNOSIS — C20 Malignant neoplasm of rectum: Secondary | ICD-10-CM | POA: Diagnosis present

## 2022-11-09 DIAGNOSIS — B9620 Unspecified Escherichia coli [E. coli] as the cause of diseases classified elsewhere: Secondary | ICD-10-CM | POA: Diagnosis present

## 2022-11-09 DIAGNOSIS — Z8249 Family history of ischemic heart disease and other diseases of the circulatory system: Secondary | ICD-10-CM

## 2022-11-09 DIAGNOSIS — R911 Solitary pulmonary nodule: Secondary | ICD-10-CM | POA: Diagnosis present

## 2022-11-09 DIAGNOSIS — R1011 Right upper quadrant pain: Secondary | ICD-10-CM | POA: Diagnosis not present

## 2022-11-09 DIAGNOSIS — Z902 Acquired absence of lung [part of]: Secondary | ICD-10-CM

## 2022-11-09 LAB — COMPREHENSIVE METABOLIC PANEL
ALT: 69 U/L — ABNORMAL HIGH (ref 0–44)
AST: 116 U/L — ABNORMAL HIGH (ref 15–41)
Albumin: 3.1 g/dL — ABNORMAL LOW (ref 3.5–5.0)
Alkaline Phosphatase: 80 U/L (ref 38–126)
Anion gap: 9 (ref 5–15)
BUN: 14 mg/dL (ref 8–23)
CO2: 21 mmol/L — ABNORMAL LOW (ref 22–32)
Calcium: 8.6 mg/dL — ABNORMAL LOW (ref 8.9–10.3)
Chloride: 108 mmol/L (ref 98–111)
Creatinine, Ser: 0.71 mg/dL (ref 0.61–1.24)
GFR, Estimated: >60 mL/min (ref >60)
Glucose, Bld: 142 mg/dL — ABNORMAL HIGH (ref 70–99)
Potassium: 4.1 mmol/L (ref 3.5–5.1)
Sodium: 138 mmol/L (ref 135–145)
Total Bilirubin: 8 mg/dL — ABNORMAL HIGH (ref 0.3–1.2)
Total Protein: 7.5 g/dL (ref 6.5–8.1)

## 2022-11-09 LAB — CBC WITH DIFFERENTIAL/PLATELET
Abs Immature Granulocytes: 0.17 10*3/uL — ABNORMAL HIGH (ref 0.00–0.07)
Basophils Absolute: 0.1 10*3/uL (ref 0.0–0.1)
Basophils Relative: 0 %
Eosinophils Absolute: 0.1 10*3/uL (ref 0.0–0.5)
Eosinophils Relative: 0 %
HCT: 45.7 % (ref 39.0–52.0)
Hemoglobin: 15.7 g/dL (ref 13.0–17.0)
Immature Granulocytes: 1 %
Lymphocytes Relative: 3 %
Lymphs Abs: 0.5 10*3/uL — ABNORMAL LOW (ref 0.7–4.0)
MCH: 34.5 pg — ABNORMAL HIGH (ref 26.0–34.0)
MCHC: 34.4 g/dL (ref 30.0–36.0)
MCV: 100.4 fL — ABNORMAL HIGH (ref 80.0–100.0)
Monocytes Absolute: 0.8 10*3/uL (ref 0.1–1.0)
Monocytes Relative: 4 %
Neutro Abs: 17.4 10*3/uL — ABNORMAL HIGH (ref 1.7–7.7)
Neutrophils Relative %: 92 %
Platelets: 144 10*3/uL — ABNORMAL LOW (ref 150–400)
RBC: 4.55 MIL/uL (ref 4.22–5.81)
RDW: 15 % (ref 11.5–15.5)
WBC: 19 10*3/uL — ABNORMAL HIGH (ref 4.0–10.5)
nRBC: 0 % (ref 0.0–0.2)

## 2022-11-09 LAB — ECHOCARDIOGRAM COMPLETE
Area-P 1/2: 2.45 cm2
Height: 67 in
MV VTI: 2.69 cm2
S' Lateral: 3 cm

## 2022-11-09 LAB — PROTIME-INR
INR: 1.5 — ABNORMAL HIGH (ref 0.8–1.2)
Prothrombin Time: 18 seconds — ABNORMAL HIGH (ref 11.4–15.2)

## 2022-11-09 LAB — LIPASE, BLOOD: Lipase: 30 U/L (ref 11–51)

## 2022-11-09 MED ORDER — PIPERACILLIN-TAZOBACTAM 3.375 G IVPB
3.3750 g | Freq: Three times a day (TID) | INTRAVENOUS | Status: DC
  Administered 2022-11-09 – 2022-11-12 (×9): 3.375 g via INTRAVENOUS
  Filled 2022-11-09 (×9): qty 50

## 2022-11-09 MED ORDER — FENTANYL CITRATE PF 50 MCG/ML IJ SOSY
50.0000 ug | PREFILLED_SYRINGE | INTRAMUSCULAR | Status: DC | PRN
  Administered 2022-11-09: 50 ug via INTRAVENOUS
  Filled 2022-11-09: qty 1

## 2022-11-09 MED ORDER — ACETAMINOPHEN 325 MG PO TABS: 650.0000 mg | ORAL_TABLET | Freq: Four times a day (QID) | ORAL | Status: DC | PRN

## 2022-11-09 MED ORDER — PERFLUTREN LIPID MICROSPHERE
1.0000 mL | INTRAVENOUS | Status: AC | PRN
  Administered 2022-11-09: 1 mL via INTRAVENOUS
  Filled 2022-11-09: qty 10

## 2022-11-09 MED ORDER — LACTATED RINGERS IV SOLN: INTRAVENOUS | Status: DC

## 2022-11-09 MED ORDER — HYDRALAZINE HCL 20 MG/ML IJ SOLN: 10.0000 mg | INTRAMUSCULAR | Status: DC | PRN

## 2022-11-09 MED ORDER — ACETAMINOPHEN 650 MG RE SUPP: 650.0000 mg | Freq: Four times a day (QID) | RECTAL | Status: DC | PRN

## 2022-11-09 MED ORDER — OXYCODONE HCL 5 MG PO TABS
5.0000 mg | ORAL_TABLET | ORAL | Status: DC | PRN
  Administered 2022-11-10: 5 mg via ORAL
  Administered 2022-11-12 – 2022-11-17 (×8): 10 mg via ORAL
  Filled 2022-11-09: qty 1
  Filled 2022-11-09 (×8): qty 2

## 2022-11-09 MED ORDER — MORPHINE SULFATE (PF) 2 MG/ML IV SOLN
2.0000 mg | INTRAVENOUS | Status: DC | PRN
  Administered 2022-11-09 – 2022-11-16 (×12): 2 mg via INTRAVENOUS
  Filled 2022-11-09 (×12): qty 1

## 2022-11-09 MED ORDER — ONDANSETRON 4 MG PO TBDP: 4.0000 mg | ORAL_TABLET | Freq: Four times a day (QID) | ORAL | Status: DC | PRN

## 2022-11-09 MED ORDER — ONDANSETRON HCL 4 MG/2ML IJ SOLN
4.0000 mg | Freq: Four times a day (QID) | INTRAMUSCULAR | Status: DC | PRN
  Administered 2022-11-10: 4 mg via INTRAVENOUS
  Filled 2022-11-09 (×2): qty 2

## 2022-11-09 NOTE — Care Management (Signed)
  Transition of Care [facility]) Screening Note   Patient Details  Name: Stephen [name] Date of Birth: 10-May-1946   Transition of [facility]) CM/SW Contact:    Carles Collet, RN Phone Number: 11/09/2022, 12:40 PM    Transition of Care Department [facility]) has reviewed patient and no TOC needs have been identified at this time. We will continue to monitor patient advancement through interdisciplinary progression rounds. If new patient transition needs arise, please place a TOC consult.   Per [facility] portal, patient is active w [facility]

## 2022-11-09 NOTE — Consult Note (Addendum)
[name]: 8:20 AM 11/09/2022  LOS: 0 days    Referring Provider: Dr [name]  Primary Care Physician:  [name], MD in [facility] [place] Primary Gastroenterologist:  unassigned  Wife is a Doreatha Martin.  Cell phone [phone]   Reason for Consultation:  choledocholithiasis.     HPI: Stephen [name] is a 76 y.o. male.  PMH rectal cancer 2014 with lung mets.  Treated with abdominal perineal resection with permanent colostomy 01/2014, chemo, LLL lung lobectomy 04/2014.  Cholelithiasis.  Hepatic steatosis dating back to at least 2018, mildly nodular hepatic contours on a CT from 04/2020.  Rheumatoid arthritis.  Peripheral neuropathy w bedbound status, requiring total care by spouse. No previous upper endoscopy. A couple of years ago patient was given about 6 months to live and was placed on hospice.  However he survived.  At that point he became bedbound after a prolonged period of several months bedrest.  For up to a year patient's been having right thoracic pain.  This is gotten worse in the last month.  For a couple of weeks has had markedly diminished stool output but stool remains brown.  Has chronic low-level nausea from "catarrh", this is not worse than usual.  Pruritus without skin eruption for a couple of years. With progression of the abdominal pain, presented to [facility] ED 12/26. Marland Kitchen  Noted short of breath, abdominal distention on exam.  Ruled in sepsis.  Received doses Maxipime, flagyl, vanc, fentanyl.    T. bili 3.7... 8. Alk phos 101... 80.  AST/ALT 167/55.. 116/69.  Lipase 34.   Lactate 5.6.. 3.6.   WBC 19 k.  Platelets 144.   Glucose 232.   CTAP w contrast: Obstructing choledocholithiasis.  CBD measuring up to 1 cm diameter.  Intrahepatic ducts not dilated.  Cholelithiasis but no changes of  cholecystitis.  PD and pancreas unremarkable.  No signs of metastatic disease or tumor recurrence.  Small HH.  Aortic atherosclerosis and coronary artery disease.  Chronic lumbar spine compression fractures without signs of lytic or blastic lesions.  Lives w wife in [place].   Drinks 3 mixed drinks w whiskey daily.  1.5 L   bottle lasts ~ 2 weeks.  Quit smoking cigarettes in 2022 Family history of CAD in mom, esophageal cancer in his father.  Breast cancer in his mother.  Brother with emphysema.  Paternal grandmother with colon cancer.  Past Medical History:  Diagnosis Date   Allergic rhinitis    Blood clot in vein 2015   left leg    History of anemia    History of chicken pox    HTN (hypertension)    Neuropathy    arms and legs - S/P Cancer tx   Rectal cancer metastasized to lung [facility]) 09-2013   surgery and chemo   Smoker     Past Surgical History:  Procedure Laterality Date   COLON RESECTION  2015   COLONOSCOPY  2015   EUS N/A 11/15/2013   Procedure: LOWER ENDOSCOPIC ULTRASOUND (EUS);  Surgeon: Milus Banister, MD;  Location:  [facility];  Service: Endoscopy;  Laterality: N/A;   LUNG BIOPSY     LUNG LOBECTOMY Left    lung removed  2015   left lower    PLANTAR FASCIA RELEASE Right 09/20/2017   Procedure: PLANTAR FASCIA [id number], excision plantar fibroma right foot;  Surgeon: Samara Deist, DPM;  Location: [place];  Service: Podiatry;  Laterality: Right;   TONSILLECTOMY AND ADENOIDECTOMY  Childhood   VARICOSE VEIN SURGERY  2010    Prior to Admission medications   Medication Sig Start Date End Date Taking? Authorizing Provider  albuterol (PROVENTIL) (2.5 MG/3ML) 0.083% nebulizer solution Take 3 mLs (2.5 mg total) by nebulization every 4 (four) hours as needed for wheezing or shortness of breath. 03/08/21   Fritzi Mandes, MD  aspirin EC 81 MG tablet Take 81 mg by mouth daily.    [provider]  esomeprazole (NEXIUM) 20 MG packet Take 20 mg by mouth daily  before breakfast.    [provider]  gabapentin (NEURONTIN) 100 MG capsule Take 2 capsules (200 mg total) by mouth 2 (two) times daily. 02/24/21 04/25/21  Bonnielee Haff, MD  loratadine (CLARITIN) 10 MG tablet Take 10 mg by mouth daily as needed.    [provider]  Multiple Vitamin (MULTIVITAMIN WITH MINERALS) TABS tablet Take 1 tablet by mouth daily. 02/25/21   Bonnielee Haff, MD  oxyCODONE-acetaminophen (PERCOCET) 7.5-325 MG tablet Take 1 tablet by mouth every 6 (six) hours as needed for severe pain. 06/24/20   Sable Feil, PA-C  vitamin B-12 (CYANOCOBALAMIN) 100 MCG tablet Take 100 mcg by mouth daily.    [provider]  vitamin C (ASCORBIC ACID) 250 MG tablet Take 250 mg by mouth daily.    [provider]  Vitamin D, Cholecalciferol, 25 MCG (1000 UT) TABS Take 1,000 Units by mouth daily.    [provider]    Scheduled Meds:  Infusions:  PRN Meds:    Allergies as of 11/08/2022   (No Known Allergies)    Family History  Problem Relation Age of Onset   Hypertension Mother    Coronary artery disease Mother        angina   Cancer Mother        breast   Cancer Father 6       esophageal   COPD Brother        emphysema   Cancer Paternal Grandmother        colon   Diabetes Neg Hx     Social History   Socioeconomic History   Marital status: Married    Spouse name: Not on file   Number of children: Not on file   Years of education: Not on file   Highest education level: Not on file  Occupational History   Not on file  Tobacco Use   Smoking status: Former    Packs/day: 0.50    Years: 15.00    Total pack years: 7.50    Types: Cigarettes    Quit date: 11/14/2020    Years since quitting: 1.9   Smokeless tobacco: Never  Vaping Use   Vaping Use: Never used  Substance and Sexual Activity   Alcohol use: Yes    Alcohol/week: 18.0 standard drinks of alcohol    Types: 18 Shots of liquor per week    Comment: Regular 2-3  scotch/day   Drug use: No   Sexual activity: Not on file  Other Topics Concern   Not on file  Social History  Narrative   Caffeine: 5-6 cups coffee/day   Lives with wife Sydell Axon), son and step son, no pets   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)   Edu: Management consultant   Activity: works outside   Diet: good water, daily fruits/vegetables   Social Determinants of Radio broadcast assistant Strain: Not on file  Food Insecurity: No Food Insecurity (11/09/2022)   Hunger Vital Sign    Worried About Running Out of Food in the Last Year: Never true    [place] in the Last Year: Never true  Transportation Needs: No Transportation Needs (11/09/2022)   PRAPARE - Hydrologist (Medical): No    Lack of Transportation (Non-Medical): No  Physical Activity: Not on file  Stress: Not on file  Social Connections: Not on file  Intimate Partner Violence: Not At Risk (11/09/2022)   Humiliation, Afraid, Rape, and Kick questionnaire    Fear of Current or Ex-Partner: No    Emotionally Abused: No    Physically Abused: No    Sexually Abused: No    REVIEW OF SYSTEMS: Constitutional:  Generally has weak muscle strength but no sense of malaise, weakness, fatigue ENT:  No nose bleeds.  Postnasal sinus drainage. Pulm: Some shortness of breath, not profound.  Chronic cough due to sinus drainage. CV:  No palpitations, no LE edema.  GU: Chronic condom cath.  No hematuria, no frequency GI: See HPI. Heme: Denies unusual or excessive bleeding or bruising. Transfusions: None. Neuro:  No visual disturbances.  No seizures or syncope.  Extremely weak lower legs Derm:  No itching, no rash or sores.  Endocrine:  No sweats or chills.  No polyuria or dysuria Immunization: Reviewed. Travel: Not queried.   PHYSICAL EXAM: Vital signs in last 24 hours: There were no vitals filed for this visit. Wt Readings from Last 3 Encounters:  11/08/22 89.8  kg  12/03/21 84.8 kg  03/05/21 73.2 kg    General: Patient looks somewhat chronically ill but not acutely ill or septic appearing.  Comfortable, alert and able to provide good history. Head: Facial asymmetry or swelling Eyes: Slight icterus. Ears: Slight HOH. Nose: No discharge or congestion. Mouth: Poor dentition.  Many teeth are absent and remaining teeth broken off and contain caries.  Mucosa is moist, pink, clear.  Tongue midline. Neck: No JVD, no masses, no thyromegaly Lungs: Clear bilaterally in front.  No labored breathing. Heart: RRR. Abdomen: Soft.  Minor tenderness in the right mid to upper abdomen without guarding or rebound.  Active bowel sounds.   Rectal: Not performed Musc/Skeltl: No marked sarcopenia or muscle wasting. Extremities: Edema in the ankles and feet bilaterally. Neurologic: Moves feet, toes, hands, fingers.  Did not test limb movement or strength.  No involuntary movement or tremor. Skin: Erythema at the tips of all fingers and palms of hands Nodes: No cervical adenopathy Psych: Calm, pleasant, cooperative.  Intake/Output from previous day: No intake/output data recorded. Intake/Output this shift: No intake/output data recorded.  LAB RESULTS: Recent Labs    11/08/22 0447 11/09/22 0423  WBC 19.0* 19.0*  HGB 17.6* 15.7  HCT 51.7 45.7  PLT 168 144*   BMET Lab Results  Component Value Date   NA 138 11/09/2022   NA 139 11/08/2022   NA 134 (L) 12/03/2021   K 4.1 11/09/2022   K 3.8 11/08/2022   K 3.9 12/03/2021   CL 108 11/09/2022   CL 102 11/08/2022   CL 102  12/03/2021   CO2 21 (L) 11/09/2022   CO2 22 11/08/2022   CO2 25 12/03/2021   GLUCOSE 142 (H) 11/09/2022   GLUCOSE 232 (H) 11/08/2022   GLUCOSE 112 (H) 12/03/2021   BUN 14 11/09/2022   BUN 15 11/08/2022   BUN 16 12/03/2021   CREATININE 0.71 11/09/2022   CREATININE 1.30 (H) 11/08/2022   CREATININE 1.24 12/03/2021   CALCIUM 8.6 (L) 11/09/2022   CALCIUM 9.3 11/08/2022   CALCIUM 9.0  12/03/2021   LFT Recent Labs    11/08/22 0447 11/09/22 0423  PROT 8.8* 7.5  ALBUMIN 4.0 3.1*  AST 167* 116*  ALT 55* 69*  ALKPHOS 101 80  BILITOT 3.7* 8.0*   PT/INR Lab Results  Component Value Date   INR 2.7 07/15/2014   INR 1.4 05/02/2014   INR 1.5 05/02/2014   Hepatitis Panel No results for input(s): "HEPBSAG", "HCVAB", "HEPAIGM", "HEPBIGM" in the last 72 hours. C-Diff No components found for: "CDIFF" Lipase     Component Value Date/Time   LIPASE 30 11/09/2022 0423   LIPASE 79 04/27/2014 2056    Drugs of Abuse     Component Value Date/Time   LABOPIA NONE DETECTED 03/05/2021 0810   COCAINSCRNUR NONE DETECTED 03/05/2021 0810   LABBENZ POSITIVE (A) 03/05/2021 0810   AMPHETMU NONE DETECTED 03/05/2021 0810   THCU NONE DETECTED 03/05/2021 0810   LABBARB NONE DETECTED 03/05/2021 0810     RADIOLOGY STUDIES: CT Abdomen Pelvis W Contrast  Result Date: 11/08/2022 CLINICAL DATA:  76 year old male with history of acute onset of nonlocalized abdominal pain radiating into the right side. Emesis. History of rectal cancer. * Tracking Code: BO * EXAM: CT ABDOMEN AND PELVIS WITH CONTRAST TECHNIQUE: Multidetector CT imaging of the abdomen and pelvis was performed using the standard protocol following bolus administration of intravenous contrast. RADIATION DOSE REDUCTION: This exam was performed according to the departmental dose-optimization program which includes automated exposure control, adjustment of the mA and/or kV according to patient size and/or use of iterative reconstruction technique. CONTRAST:  179m OMNIPAQUE IOHEXOL 300 MG/ML  SOLN COMPARISON:  CT of the abdomen and pelvis 03/05/2021. FINDINGS: Lower chest: Linear scarring or atelectasis in the right lower lobe. Small hiatal hernia. Atherosclerotic calcifications in the descending thoracic aorta as well as the right coronary artery. Hepatobiliary: Subcentimeter low-attenuation lesion in segment 4A of the liver, too small  to characterize, but similar to the prior study and statistically likely tiny cysts (no imaging follow-up recommended). No other aggressive appearing hepatic lesions are noted. No intrahepatic biliary ductal dilatation. Common bile duct is dilated measuring 1 cm in the porta hepatis. There are multiple high attenuation stones in the distal common bile duct, largest of which measures up to 8 mm (coronal image 52 of series 5). Gallbladder is moderately distended with several small calcified gallstones lying dependently. Gallbladder wall thickness is normal. No pericholecystic fluid or surrounding inflammatory changes. Pancreas: No pancreatic mass. No pancreatic ductal dilatation noted on MRCP images. No pancreatic or peripancreatic fluid collections or inflammatory changes. Spleen: Unremarkable. Adrenals/Urinary Tract: Low-attenuation lesions in the right kidney measuring up to 2.2 cm in the posterior aspect of the upper pole, compatible with simple cysts (Bosniak class 1, no imaging follow-up recommended). Left kidney and bilateral adrenal glands are otherwise unremarkable in appearance. No hydroureteronephrosis. Urinary bladder is largely decompressed, but otherwise unremarkable in appearance. Stomach/Bowel: The appearance of the stomach is unremarkable. There is no pathologic dilatation of small bowel or colon. Status post low anterior resection with  left lower quadrant colostomy. Normal appendix. Vascular/Lymphatic: Atherosclerosis throughout the abdominal aorta and pelvic vasculature, without evidence of aneurysm or dissection. No lymphadenopathy noted in the abdomen or pelvis. Reproductive: Prostate gland and seminal vesicles are unremarkable in appearance. Other: No significant volume of ascites.  No pneumoperitoneum. Musculoskeletal: Chronic compression fractures of L4 and L5 are again noted, most severe at L4 where there is near complete loss of central vertebral body height, similar to the prior study. There  are no aggressive appearing lytic or blastic lesions noted in the visualized portions of the skeleton. IMPRESSION: 1. Study is positive for obstructive choledocholithiasis with common bile duct measuring up to 1 cm in diameter. No intrahepatic biliary ductal dilatation is noted at this time. 2. Cholelithiasis without evidence of acute cholecystitis. 3. No definite signs of tumor recurrence or metastatic disease in the abdomen or pelvis. 4. Aortic atherosclerosis, in addition to at least right coronary artery disease. Assessment for potential risk factor modification, dietary therapy or pharmacologic therapy may be warranted, if clinically indicated. 5. Small hiatal hernia. 6. Additional incidental findings, as above. Electronically Signed   By: Vinnie Langton M.D.   On: 11/08/2022 06:29   CT Head Wo Contrast  Result Date: 11/08/2022 CLINICAL DATA:  Mental status change with unknown cause EXAM: CT HEAD WITHOUT CONTRAST TECHNIQUE: Contiguous axial images were obtained from the base of the skull through the vertex without intravenous contrast. RADIATION DOSE REDUCTION: This exam was performed according to the departmental dose-optimization program which includes automated exposure control, adjustment of the mA and/or kV according to patient size and/or use of iterative reconstruction technique. COMPARISON:  03/05/2021 FINDINGS: Brain: No evidence of acute infarction, hemorrhage, hydrocephalus, extra-axial collection or mass lesion/mass effect. Generalized cortical atrophy. Vascular: No hyperdense vessel or unexpected calcification. Skull: Normal. Negative for fracture or focal lesion. Sinuses/Orbits: No acute finding. IMPRESSION: No acute or reversible finding. Generalized brain atrophy. Electronically Signed   By: Jorje Guild M.D.   On: 11/08/2022 06:21   DG Chest Port 1 View  Result Date: 11/08/2022 CLINICAL DATA:  76 year old male with history of sepsis. EXAM: PORTABLE CHEST 1 VIEW COMPARISON:  Chest  x-ray 03/05/2021. FINDINGS: Lung volumes are low. Mild elevation of the right hemidiaphragm. Mild diffuse interstitial prominence. No consolidative airspace disease. No pleural effusions. No pneumothorax. Nodular density projecting over the left mid lung estimated measure 2.5 x 1.6 cm. Pulmonary vasculature and the cardiomediastinal silhouette are within normal limits. Atherosclerotic calcifications in the thoracic aorta. IMPRESSION: 1. Diffuse interstitial prominence, similar to prior studies, favored to be chronic. 2. New nodular density in the left mid lung concerning for potential neoplasm. Further evaluation with noncontrast chest CT should be considered. 3. Aortic atherosclerosis. Electronically Signed   By: Vinnie Langton M.D.   On: 11/08/2022 05:20      IMPRESSION:     Choledocholithiasis.  Likely cholangitis w sepsis.      Lung nodule.  Concerning for neoplasm.  Status post LLL wedge resection for lung mets of colon cancer, 2015.  Rectal cancer treated with AP resection, permanent colostomy, chemotherapy 2015.    Elevated serum glucose.  No diabetes meds PTA.      Thrombocytopenia, mild.     PLAN:     ERCP tomorrow afternoon with Dr. [name].  Risks of pancreatitis, bile duct or bowel perforation, inability to remove stone and possible need for stent placement discussed with patient and his wife.  They are eager to proceed.  Continue antibiotics.  Check PT/INR  Azucena Freed  11/09/2022, 8:20 AM Phone [phone]

## 2022-11-09 NOTE — H&P (Signed)
History and Physical    Patient: Stephen [name] ZJQ:[id number] DOB: [birth year] DOA: 11/09/2022 DOS: the patient was seen and examined on 11/09/2022 PCP: Stephen Jude, MD  Patient coming from: Home - lives with wife; Stephen [name]: Wife, [phone], [phone]   Chief Complaint: Abdominal pain  HPI: Stephen [name] is a 76 y.o. 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   He developed acute abdominal pain, vomiting.  Pain started 2-3 days ago, although he has had some pain in the last 6-7 months with cramping in his ribs and abdomen.  Pain as periumbilixcal and lower abdomen and in the R flank.  Pain is mild now, would like some more fentanyl.  Last emesis was yesterday about 0600.  No fever.  He is better now than upon arrival.    He has distal finger > toe pulp erythema with intraosseous muscle atrophy.  He also has had progressive weakness over time and is now predominantly non-ambulatory.      ER Course:  Abdominal pain, distention.  Lactate 3.5 -> 5.6, WBC 19k.  Sepsis, source appears to be choledocholithiasis.  AST 167, ALT 55, Bili 3.7.  Concern for cholangitis.  If ok with GI and they have the capacity for urgent ERCP, will accept to med tele at this time. Dr. Lorenso [name] has accepted the patient on behalf of Dr. Henrene [name].      Review of Systems: As mentioned in the history of present illness. All other systems reviewed and are negative. Past Medical History:  Diagnosis Date   Allergic rhinitis    Blood clot in vein 2015   left leg    History of anemia    History of chicken pox    HTN (hypertension)    Neuropathy    arms and legs - S/P Cancer tx   Rectal cancer metastasized to lung [facility]) 09-2013   surgery and chemo   Smoker    Past Surgical History:  Procedure Laterality Date   COLON RESECTION  2015   COLONOSCOPY  2015   EUS N/A 11/15/2013    Procedure: LOWER ENDOSCOPIC ULTRASOUND (EUS);  Surgeon: Milus Banister, MD;  Location: [facility];  Service: Endoscopy;  Laterality: N/A;   LUNG BIOPSY     LUNG LOBECTOMY Left    lung removed  2015   left lower    PLANTAR FASCIA RELEASE Right 09/20/2017   Procedure: PLANTAR FASCIA [id number], excision plantar fibroma right foot;  Surgeon: Samara Deist, DPM;  Location: [facility];  Service: Podiatry;  Laterality: Right;   TONSILLECTOMY AND ADENOIDECTOMY  Childhood   VARICOSE VEIN SURGERY  2010   Social History:  reports that he quit smoking about 1 years ago. His smoking use included cigarettes. He has a 7.50 pack-year smoking history. He has never used smokeless tobacco. He reports current alcohol use of about 18.0 standard drinks of alcohol per week. He reports that he does not use drugs.  No Known Allergies  Family History  Problem Relation Age of Onset   Hypertension Mother    Coronary artery disease Mother        angina   Cancer Mother        breast   Cancer Father 57       esophageal   COPD Brother        emphysema   Cancer Paternal Grandmother  colon   Diabetes Neg Hx     Prior to Admission medications   Medication Sig Start Date End Date Taking? Authorizing Provider  albuterol (PROVENTIL) (2.5 MG/3ML) 0.083% nebulizer solution Take 3 mLs (2.5 mg total) by nebulization every 4 (four) hours as needed for wheezing or shortness of breath. 03/08/21   Fritzi Mandes, MD  aspirin EC 81 MG tablet Take 81 mg by mouth daily.    [provider]  esomeprazole (NEXIUM) 20 MG packet Take 20 mg by mouth daily before breakfast.    [provider]  gabapentin (NEURONTIN) 100 MG capsule Take 2 capsules (200 mg total) by mouth 2 (two) times daily. 02/24/21 04/25/21  Bonnielee Haff, MD  loratadine (CLARITIN) 10 MG tablet Take 10 mg by mouth daily as needed.    [provider]  Multiple Vitamin (MULTIVITAMIN WITH MINERALS) TABS tablet Take 1 tablet by  mouth daily. 02/25/21   Bonnielee Haff, MD  oxyCODONE-acetaminophen (PERCOCET) 7.5-325 MG tablet Take 1 tablet by mouth every 6 (six) hours as needed for severe pain. 06/24/20   Sable Feil, PA-C  vitamin B-12 (CYANOCOBALAMIN) 100 MCG tablet Take 100 mcg by mouth daily.    [provider]  vitamin C (ASCORBIC ACID) 250 MG tablet Take 250 mg by mouth daily.    [provider]  Vitamin D, Cholecalciferol, 25 MCG (1000 UT) TABS Take 1,000 Units by mouth daily.    [provider]    Physical Exam: Vitals:   11/09/22 0650 11/09/22 1131 11/09/22 1548  BP:  (!) 141/77 (!) 141/114  Pulse:  88 73  Resp:  18 18  Temp:  98.3 F (36.8 C) 97.9 F (36.6 C)  TempSrc:  Oral   SpO2:  93% 93%  Height: '5\' 7"'$  (1.702 m)     General:  Appears calm and comfortable and is in NAD, [place] Eyes:  EOMI, normal lids, iris ENT:  grossly normal hearing, lips & tongue, mmm; poor dentition Neck:  no LAD, masses or thyromegaly Cardiovascular:  RRR, no m/r/g. 1-2+ LE edema.  Respiratory:   CTA bilaterally with no wheezes/rales/rhonchi.  Normal respiratory effort. Abdomen:  soft, RUQ > midepigastric TTP, ND, NABS Skin:  distal finger > toe and palmar erythema with intraosseous muscle wasting   Musculoskeletal:  decreased strength BUE < BLE, no bony abnormality Psychiatric:  grossly normal mood and affect, speech fluent and appropriate, AOx3 Neurologic:  CN 2-12 grossly intact, moves all extremities in coordinated fashion   Radiological Exams on Admission: Independently reviewed - see discussion in A/P where applicable  ECHOCARDIOGRAM COMPLETE  Result Date: 11/09/2022    ECHOCARDIOGRAM REPORT   Patient Name:   Stephen [name] Date of Exam: 11/09/2022 Medical Rec #:  [id number]       Height:       67.0 in Accession #:    [id number]      Weight:       198.0 lb Date of Birth:  Sep 04, 1946       BSA:          2.014 m Patient Age:    75 years        BP:           141/77 mmHg Patient  Gender: M               HR:           93 bpm. Exam Location:  Inpatient Procedure: 2D Echo, Color Doppler, Cardiac Doppler and Intracardiac  Opacification Agent STAT ECHO Indications:    Preoperative evaluation  History:        Patient has no prior history of Echocardiogram examinations.                 Risk Factors:Former Smoker, Hypertension and ETOH. Hx of cancer                 s/p chemotherapy.  Sonographer:    Eartha Inch Referring Phys: Margie Billet, A  Sonographer Comments: Technically difficult study due to poor echo windows. Image acquisition challenging due to patient body habitus and Image acquisition challenging due to respiratory motion. IMPRESSIONS  1. Left ventricular ejection fraction, by estimation, is 60 to 65%. The left ventricle has normal function. The left ventricle has no regional wall motion abnormalities. Left ventricular diastolic parameters are consistent with Grade I diastolic dysfunction (impaired relaxation).  2. Right ventricular systolic function is normal. The right ventricular size is normal. Tricuspid regurgitation signal is inadequate for assessing PA pressure.  3. The mitral valve is grossly normal. Trivial mitral valve regurgitation.  4. The aortic valve is tricuspid. Aortic valve regurgitation is not visualized. Aortic valve sclerosis is present, with no evidence of aortic valve stenosis.  5. Aortic dilatation noted. There is borderline dilatation of the aortic root, measuring 38 mm.  6. The inferior vena cava is normal in size with greater than 50% respiratory variability, suggesting right atrial pressure of 3 mmHg. Comparison(s): No prior Echocardiogram. FINDINGS  Left Ventricle: Left ventricular ejection fraction, by estimation, is 60 to 65%. The left ventricle has normal function. The left ventricle has no regional wall motion abnormalities. Definity contrast agent was given IV to delineate the left ventricular  endocardial borders. The left ventricular  internal cavity size was normal in size. There is no left ventricular hypertrophy. Left ventricular diastolic parameters are consistent with Grade I diastolic dysfunction (impaired relaxation). Right Ventricle: The right ventricular size is normal. No increase in right ventricular wall thickness. Right ventricular systolic function is normal. Tricuspid regurgitation signal is inadequate for assessing PA pressure. Left Atrium: Left atrial size was normal in size. Right Atrium: Right atrial size was normal in size. Pericardium: There is no evidence of pericardial effusion. Mitral Valve: The mitral valve is grossly normal. Trivial mitral valve regurgitation. MV peak gradient, 3.2 mmHg. The mean mitral valve gradient is 1.0 mmHg. Tricuspid Valve: The tricuspid valve is normal in structure. Tricuspid valve regurgitation is not demonstrated. Aortic Valve: The aortic valve is tricuspid. Aortic valve regurgitation is not visualized. Aortic valve sclerosis is present, with no evidence of aortic valve stenosis. Pulmonic Valve: The pulmonic valve was normal in structure. Pulmonic valve regurgitation is trivial. Aorta: Aortic dilatation noted. There is borderline dilatation of the aortic root, measuring 38 mm. Venous: The inferior vena cava is normal in size with greater than 50% respiratory variability, suggesting right atrial pressure of 3 mmHg. IAS/Shunts: The atrial septum is grossly normal.  LEFT VENTRICLE PLAX 2D LVIDd:         3.90 cm   Diastology LVIDs:         3.00 cm   LV e' medial:    5.98 cm/s LV PW:         0.90 cm   LV E/e' medial:  9.3 LV IVS:        0.80 cm   LV e' lateral:   10.20 cm/s LVOT diam:     2.10 cm   LV E/e' lateral: 5.4 LV SV:  58 LV SV Index:   29 LVOT Area:     3.46 cm  RIGHT VENTRICLE             IVC RV S prime:     14.50 cm/s  IVC diam: 0.70 cm TAPSE (M-mode): 1.7 cm LEFT ATRIUM             Index        RIGHT ATRIUM           Index LA diam:        3.10 cm 1.54 cm/m   RA Area:     15.90  cm LA Vol (A2C):   38.0 ml 18.87 ml/m  RA Volume:   44.40 ml  22.05 ml/m LA Vol (A4C):   41.4 ml 20.56 ml/m LA Biplane Vol: 41.7 ml 20.71 ml/m  AORTIC VALVE LVOT Vmax:   89.40 cm/s LVOT Vmean:  64.800 cm/s LVOT VTI:    0.168 m  AORTA Ao Root diam: 3.80 cm MITRAL VALVE MV Area (PHT): 2.45 cm    SHUNTS MV Area VTI:   2.69 cm    Systemic VTI:  0.17 m MV Peak grad:  3.2 mmHg    Systemic Diam: 2.10 cm MV Mean grad:  1.0 mmHg MV Vmax:       0.89 m/s MV Vmean:      56.0 cm/s MV Decel Time: 310 msec MV E velocity: 55.50 cm/s MV A velocity: 78.60 cm/s MV E/A ratio:  0.71 Gwyndolyn Kaufman MD Electronically signed by Gwyndolyn Kaufman MD Signature Date/Time: 11/09/2022/2:19:04 PM    Final    CT Abdomen Pelvis W Contrast  Result Date: 11/08/2022 CLINICAL DATA:  76 year old male with history of acute onset of nonlocalized abdominal pain radiating into the right side. Emesis. History of rectal cancer. * Tracking Code: BO * EXAM: CT ABDOMEN AND PELVIS WITH CONTRAST TECHNIQUE: Multidetector CT imaging of the abdomen and pelvis was performed using the standard protocol following bolus administration of intravenous contrast. RADIATION DOSE REDUCTION: This exam was performed according to the departmental dose-optimization program which includes automated exposure control, adjustment of the mA and/or kV according to patient size and/or use of iterative reconstruction technique. CONTRAST:  172m OMNIPAQUE IOHEXOL 300 MG/ML  SOLN COMPARISON:  CT of the abdomen and pelvis 03/05/2021. FINDINGS: Lower chest: Linear scarring or atelectasis in the right lower lobe. Small hiatal hernia. Atherosclerotic calcifications in the descending thoracic aorta as well as the right coronary artery. Hepatobiliary: Subcentimeter low-attenuation lesion in segment 4A of the liver, too small to characterize, but similar to the prior study and statistically likely tiny cysts (no imaging follow-up recommended). No other aggressive appearing hepatic  lesions are noted. No intrahepatic biliary ductal dilatation. Common bile duct is dilated measuring 1 cm in the porta hepatis. There are multiple high attenuation stones in the distal common bile duct, largest of which measures up to 8 mm (coronal image 52 of series 5). Gallbladder is moderately distended with several small calcified gallstones lying dependently. Gallbladder wall thickness is normal. No pericholecystic fluid or surrounding inflammatory changes. Pancreas: No pancreatic mass. No pancreatic ductal dilatation noted on MRCP images. No pancreatic or peripancreatic fluid collections or inflammatory changes. Spleen: Unremarkable. Adrenals/Urinary Tract: Low-attenuation lesions in the right kidney measuring up to 2.2 cm in the posterior aspect of the upper pole, compatible with simple cysts (Bosniak class 1, no imaging follow-up recommended). Left kidney and bilateral adrenal glands are otherwise unremarkable in appearance. No hydroureteronephrosis. Urinary bladder is largely decompressed, but  otherwise unremarkable in appearance. Stomach/Bowel: The appearance of the stomach is unremarkable. There is no pathologic dilatation of small bowel or colon. Status post low anterior resection with left lower quadrant colostomy. Normal appendix. Vascular/Lymphatic: Atherosclerosis throughout the abdominal aorta and pelvic vasculature, without evidence of aneurysm or dissection. No lymphadenopathy noted in the abdomen or pelvis. Reproductive: Prostate gland and seminal vesicles are unremarkable in appearance. Other: No significant volume of ascites.  No pneumoperitoneum. Musculoskeletal: Chronic compression fractures of L4 and L5 are again noted, most severe at L4 where there is near complete loss of central vertebral body height, similar to the prior study. There are no aggressive appearing lytic or blastic lesions noted in the visualized portions of the skeleton. IMPRESSION: 1. Study is positive for obstructive  choledocholithiasis with common bile duct measuring up to 1 cm in diameter. No intrahepatic biliary ductal dilatation is noted at this time. 2. Cholelithiasis without evidence of acute cholecystitis. 3. No definite signs of tumor recurrence or metastatic disease in the abdomen or pelvis. 4. Aortic atherosclerosis, in addition to at least right coronary artery disease. Assessment for potential risk factor modification, dietary therapy or pharmacologic therapy may be warranted, if clinically indicated. 5. Small hiatal hernia. 6. Additional incidental findings, as above. Electronically Signed   By: Vinnie Langton M.D.   On: 11/08/2022 06:29   CT Head Wo Contrast  Result Date: 11/08/2022 CLINICAL DATA:  Mental status change with unknown cause EXAM: CT HEAD WITHOUT CONTRAST TECHNIQUE: Contiguous axial images were obtained from the base of the skull through the vertex without intravenous contrast. RADIATION DOSE REDUCTION: This exam was performed according to the departmental dose-optimization program which includes automated exposure control, adjustment of the mA and/or kV according to patient size and/or use of iterative reconstruction technique. COMPARISON:  03/05/2021 FINDINGS: Brain: No evidence of acute infarction, hemorrhage, hydrocephalus, extra-axial collection or mass lesion/mass effect. Generalized cortical atrophy. Vascular: No hyperdense vessel or unexpected calcification. Skull: Normal. Negative for fracture or focal lesion. Sinuses/Orbits: No acute finding. IMPRESSION: No acute or reversible finding. Generalized brain atrophy. Electronically Signed   By: Jorje Guild M.D.   On: 11/08/2022 06:21   DG Chest Port 1 View  Result Date: 11/08/2022 CLINICAL DATA:  76 year old male with history of sepsis. EXAM: PORTABLE CHEST 1 VIEW COMPARISON:  Chest x-ray 03/05/2021. FINDINGS: Lung volumes are low. Mild elevation of the right hemidiaphragm. Mild diffuse interstitial prominence. No consolidative  airspace disease. No pleural effusions. No pneumothorax. Nodular density projecting over the left mid lung estimated measure 2.5 x 1.6 cm. Pulmonary vasculature and the cardiomediastinal silhouette are within normal limits. Atherosclerotic calcifications in the thoracic aorta. IMPRESSION: 1. Diffuse interstitial prominence, similar to prior studies, favored to be chronic. 2. New nodular density in the left mid lung concerning for potential neoplasm. Further evaluation with noncontrast chest CT should be considered. 3. Aortic atherosclerosis. Electronically Signed   By: Vinnie Langton M.D.   On: 11/08/2022 05:20    EKG: Independently reviewed.  Sinus tachycardia with rate 100; low voltage with no evidence of acute ischemia   Labs on Admission: I have personally reviewed the available labs and imaging studies at the time of the admission.  Pertinent labs:    Glucose 232, 142 Albumin 3.1 AST 167 -> 116/ALT 55 -> 69/Bili 3.7 -> 8.0 HS troponin 20 Lactate 3.5, 5.6, 3.6, 3.6, 1.4 Procalcitonin <0.10 WBC 19 UA 12/26: moderate LE, >300 protein COVID/flu/RSV negative   Assessment and Plan: Active Problems:   HTN (hypertension)  Rectal cancer ([place])   Choledocholithiasis with acute cholecystitis with obstruction   Lung nodule, solitary   Chronic diastolic CHF (congestive heart failure) (HCC)   Alcohol abuse   DNR (do not resuscitate)    Choledocholithiasis with cholangitis -Patient with prior h/o colorectal CA and residual colostomy presenting with severe abdominal pain, vomiting -Elevated LFTs -Imaging at [facility] was positive for obstructive choledocholithiasis with CBD dilatation; suspect passed stone based on the marked improvement in patient's symptoms from yesterday to today  -Full liquids, NPO after midnight per GI -Plan for ERCP tomorrow -Morphine for pain, Zofran for nausea -Surgery to consult for probable cholecystectomy -Empiric coverage with Zosyn for now   Pre-op  clearance -He does not have known CV history, although ASCVD was appreciated on CT -Intraperitoneal surgery is associated with increased risk -The revised cardiac index gives a risk estimate of  class 2 risk, 6% -Because of this risk, he/she is recommended to have pre-operative EKG testing prior to surgery; this was done in the ER -The Detsky's Modified Cardiac Risk Index score is 5, with a low cardiac risk -Echo was performed and shows preserved EF with grade 1 DD, otherwise unremarkable -It is reasonable for the patient to go to the OR without additional evaluation   Lung nodule -New L mid-lung nodule noted on CXR -Will need chest CT for further evaluation - as outpatient unless new issues arise  Chronic diastolic CHF -Grade 1 DD on echo today -No symptoms of volume overload at this time  HTN -He reports no longer taking medications for this issue -May need resumption -Will cover with prn IV hydralazine for now  Remote h/o metastatic rectal cancer -Reports remission -Has residual indwelling foley and colostomy  Alcohol abuse -Reports 2-3 drinks per day -Denies h/o withdrawal or concerns about his drinking  Red fingers syndrome? -Patient has extensive distal finger pad erythema with more subtle toe pad erythema; also with palmar erythema and intraosseous muscle atrophy -He has had progressive debility and is mostly bedbound at this time -Consider outpatient dermatology evaluation -?pseudolymphoma -May benefit from PT/OT evaluations once acute issues are improved  DNR -I have discussed code status with the patient and his wife and  they are in agreement that the patient would not desire resuscitation and would prefer to die a natural death should that situation arise. -He will need a gold out of facility DNR form at the time of discharge      Advance Care Planning:   Code Status: DNR   Consults: Surgery; GI  DVT Prophylaxis: SCDs  Family Communication: Wife was  present throughout evaluation  Severity of Illness: The appropriate patient status for this patient is INPATIENT. Inpatient status is judged to be reasonable and necessary in order to provide the required intensity of service to ensure the patient's safety. The patient's presenting symptoms, physical exam findings, and initial radiographic and laboratory data in the context of their chronic comorbidities is felt to place them at high risk for further clinical deterioration. Furthermore, it is not anticipated that the patient will be medically stable for discharge from the hospital within 2 midnights of admission.   * I certify that at the point of admission it is my clinical judgment that the patient will require inpatient hospital care spanning beyond 2 midnights from the point of admission due to high intensity of service, high risk for further deterioration and high frequency of surveillance required.*  Author: Karmen Bongo, MD 11/09/2022 4:44 PM  For on call review [email or web address].

## 2022-11-09 NOTE — Care Management (Signed)
  Transition of Care [facility]) Screening Note   Patient Details  Name: Stephen [name] Date of Birth: 07/05/1946   [facility] of Care [facility]) CM/SW Contact:    Carles Collet, RN Phone Number: 11/09/2022, 8:48 AM    Transition of Care Department [facility]) has reviewed patient will follow patient advancement through interdisciplinary progression rounds.   Patient is active in [facility] portal with [facility].

## 2022-11-09 NOTE — ED Notes (Signed)
Received call from patient placement- pt has assigned bed 6N-Bed 19 with Karmen Bongo as the receiving physician

## 2022-11-09 NOTE — ED Notes (Signed)
[place] reviewed by this RN ?

## 2022-11-09 NOTE — ED Notes (Signed)
[facility] at bedside for transport. 

## 2022-11-09 NOTE — Progress Notes (Signed)
  Echocardiogram 2D Echocardiogram has been performed.  Stephen [name] 11/09/2022, 2:06 PM

## 2022-11-09 NOTE — Consult Note (Signed)
Stephen [name] [birth year]  [id number].    Requesting MD: [name], MD Chief Complaint/Reason for Consult: choledocholithiasis, ascending cholangitis   HPI:  Stephen [name] is a 76 y/o M with PMH HTN, RA, and rectal cancer w/ pulmonary mets (2014) s/p APR and end colostomy 2015, left lung lobectomy, and chemotherapy. He presents with acute abdominal pain that started 2-3 days ago. Described as upper abdominal pain associated with nausea and vomiting. States that he had never had pain like this before. The pain would not relent so they called EMS and he was taken to the ED. He denies fever, chills, chest pain, or changes in bowel habits.  He denies a PMH CVA, MI, cardiac arrhythmia, or known cardiac condition, denies pulmonary conditions   Surgical history: APR 2015, ex lap LOA 04/2014 for SBO ([facility]) Blood thinners: none Social hx: former smoker, denies EtOH, or drug use. lives at home with his wife, he is bed bound and his wife cares for him.  ED Course:  Lactate 5.6 > 3.6 > 1.4, WBC 19k.  AST 167, ALT 55, Bili 3.7 > 8.0. Lipase is 30. CT abdomen and pelvis showed choledocholithiasis with a dilated common duct. Cholelithiasis without cholecystitis. Patients was transferred from [facility] to [facility] for GI consult/ERCP. Of note CT scan also showed CAD and aortic atherosclerosis.   ROS: As above    Family History  Problem Relation Age of Onset   Hypertension Mother    Coronary artery disease Mother        angina   Cancer Mother        breast   Cancer Father 20       esophageal   COPD Brother        emphysema   Cancer Paternal Grandmother        colon   Diabetes Neg Hx     Past Medical History:  Diagnosis Date   Allergic rhinitis    Blood clot in vein 2015   left leg    History of anemia    History of chicken pox    HTN (hypertension)    Neuropathy    arms and legs - S/P Cancer tx   Rectal cancer metastasized to lung [facility]) 09-2013   surgery and chemo   Smoker      Past Surgical History:  Procedure Laterality Date   COLON RESECTION  2015   COLONOSCOPY  2015   EUS N/A 11/15/2013   Procedure: LOWER ENDOSCOPIC ULTRASOUND (EUS);  Surgeon: Milus Banister, MD;  Location: [facility];  Service: Endoscopy;  Laterality: N/A;   LUNG BIOPSY     LUNG LOBECTOMY Left    lung removed  2015   left lower    PLANTAR FASCIA RELEASE Right 09/20/2017   Procedure: PLANTAR FASCIA [id number], excision plantar fibroma right foot;  Surgeon: Samara Deist, DPM;  Location: [facility];  Service: Podiatry;  Laterality: Right;   TONSILLECTOMY AND ADENOIDECTOMY  Childhood   VARICOSE VEIN SURGERY  2010    Social History:  reports that he quit smoking about 1 years ago. His smoking use included cigarettes. He has a 7.50 pack-year smoking history. He has never used smokeless tobacco. He reports current alcohol use of about 18.0 standard drinks of alcohol per week. He reports that he does not use drugs.  Allergies: No Known Allergies  Medications Prior to Admission  Medication Sig Dispense Refill   esomeprazole (NEXIUM) 20 MG packet Take 20 mg by mouth daily before  breakfast.     hydrOXYzine (ATARAX) 10 MG tablet Take 10 mg by mouth 3 (three) times daily as needed for itching (allergies).     MAGNESIUM PO Take 1 tablet by mouth daily.     Multiple Vitamin (MULTIVITAMIN WITH MINERALS) TABS tablet Take 1 tablet by mouth daily.     Omega-3 Fatty Acids (OMEGA 3 PO) Take 1 tablet by mouth daily.     oxyCODONE-acetaminophen (PERCOCET) 7.5-325 MG tablet Take 1 tablet by mouth every 6 (six) hours as needed for severe pain. 12 tablet 0   polyethylene glycol (MIRALAX / GLYCOLAX) 17 g packet Take 17 g by mouth daily as needed for moderate constipation.     vitamin B-12 (CYANOCOBALAMIN) 100 MCG tablet Take 100 mcg by mouth daily.     vitamin C (ASCORBIC ACID) 250 MG tablet Take 250 mg by mouth daily.     Vitamin D, Cholecalciferol, 25 MCG (1000 UT) TABS Take 1,000 Units by mouth  daily.       Physical Exam: Blood pressure (!) 141/77, pulse 88, temperature 98.3 F (36.8 C), temperature source Oral, resp. rate 18, height '5\' 7"'$  (1.702 m), SpO2 93 %. General: Elderly male who appears chronically ill, no acute distress, he is jaundiced HEENT: head -normocephalic, atraumatic; Eyes: pupils equal and round, Mild scleral icterus present Neck- Trachea is midline, no thyromegaly  CV- RRR, no obvious M/R/G, he has mild lower extremity edema L>R Pulm- breathing is non-labored on room air. CTABL, no wheezes, rhales, rhonchi. Abd- soft, mild TTP RUQ and epigastric region without rebound or guarding, +BS, no masses, hernias, or organomegaly, previous laparotomy scar appears well healing, RLQ colostomy with small amount of stool in pouch. GU- deferred  Neuro- non-focal exam, gait not assessed  Psych- Alert and Oriented x3 with appropriate affect Skin: warm and dry, no rashes or lesions   Results for orders placed or performed during the hospital encounter of 11/08/22 (from the past 48 hour(s))  Culture, blood (routine x 2)     Status: None (Preliminary result)   Collection Time: 11/08/22  4:47 AM   Specimen: BLOOD  Result Value Ref Range   Specimen Description BLOOD RIGHT ANTECUBITAL    Special Requests      BOTTLES DRAWN AEROBIC AND ANAEROBIC Blood Culture adequate volume   Culture      NO GROWTH 1 DAY Performed at [facility], [address] [place], [place] [address]    Report Status PENDING   Lactic acid, plasma     Status: Abnormal   Collection Time: 11/08/22  4:47 AM  Result Value Ref Range   Lactic Acid, Venous 3.5 (HH) 0.5 - 1.9 mmol/L    Comment: CRITICAL RESULT CALLED TO, READ BACK BY AND VERIFIED WITH EMILY HATAWAY RN '@[id number]'$  11/08/22 ASW Performed at [facility], [address] [place], [place] [address]   CBC with Differential     Status: Abnormal   Collection Time: 11/08/22  4:47 AM  Result Value Ref Range   WBC 19.0 (H) 4.0 -  10.5 K/uL   RBC 5.10 4.22 - 5.81 MIL/uL   Hemoglobin 17.6 (H) 13.0 - 17.0 g/dL   HCT 51.7 39.0 - 52.0 %   MCV 101.4 (H) 80.0 - 100.0 fL   MCH 34.5 (H) 26.0 - 34.0 pg   MCHC 34.0 30.0 - 36.0 g/dL   RDW 14.5 11.5 - 15.5 %   Platelets 168 150 - 400 K/uL   nRBC 0.0 0.0 - 0.2 %  Neutrophils Relative % 91 %   Neutro Abs 17.3 (H) 1.7 - 7.7 K/uL   Lymphocytes Relative 4 %   Lymphs Abs 0.7 0.7 - 4.0 K/uL   Monocytes Relative 4 %   Monocytes Absolute 0.8 0.1 - 1.0 K/uL   Eosinophils Relative 0 %   Eosinophils Absolute 0.0 0.0 - 0.5 K/uL   Basophils Relative 0 %   Basophils Absolute 0.1 0.0 - 0.1 K/uL   WBC Morphology INCREASED BANDS (>20% BANDS)    RBC Morphology MORPHOLOGY UNREMARKABLE    Smear Review Normal platelet morphology    Immature Granulocytes 1 %   Abs Immature Granulocytes 0.11 (H) 0.00 - 0.07 K/uL    Comment: Performed at [facility], [address] [place], [place] [address]  Comprehensive metabolic panel     Status: Abnormal   Collection Time: 11/08/22  4:47 AM  Result Value Ref Range   Sodium 139 135 - 145 mmol/L   Potassium 3.8 3.5 - 5.1 mmol/L   Chloride 102 98 - 111 mmol/L   CO2 22 22 - 32 mmol/L   Glucose, Bld 232 (H) 70 - 99 mg/dL    Comment: Glucose reference range applies only to samples taken after fasting for at least 8 hours.   BUN 15 8 - 23 mg/dL   Creatinine, Ser 1.30 (H) 0.61 - 1.24 mg/dL   Calcium 9.3 8.9 - 10.3 mg/dL   Total Protein 8.8 (H) 6.5 - 8.1 g/dL   Albumin 4.0 3.5 - 5.0 g/dL   AST 167 (H) 15 - 41 U/L   ALT 55 (H) 0 - 44 U/L   Alkaline Phosphatase 101 38 - 126 U/L   Total Bilirubin 3.7 (H) 0.3 - 1.2 mg/dL   GFR, Estimated 57 (L) >60 mL/min    Comment: (NOTE) Calculated using the CKD-EPI Creatinine Equation (2021)    Anion gap 15 5 - 15    Comment: Performed at [facility], [address] [place], [place] [address]  Troponin I (High Sensitivity)     Status: None   Collection Time: 11/08/22  4:47 AM  Result Value  Ref Range   Troponin I (High Sensitivity) 13 <18 ng/L    Comment: (NOTE) Elevated high sensitivity troponin I (hsTnI) values and significant  changes across serial measurements may suggest ACS but many other  chronic and acute conditions are known to elevate hsTnI results.  Refer to the "Links" section for chest pain algorithms and additional  guidance. Performed at [facility], [address] [place], [place] [address]   Lipase, blood     Status: None   Collection Time: 11/08/22  4:47 AM  Result Value Ref Range   Lipase 34 11 - 51 U/L    Comment: Performed at [facility], [address] [place], [place] [address]  Urinalysis, Routine w reflex microscopic Anterior Nasal Swab     Status: Abnormal   Collection Time: 11/08/22  4:47 AM  Result Value Ref Range   Color, Urine YELLOW (A) YELLOW   APPearance TURBID (A) CLEAR   Specific Gravity, Urine 1.015 1.005 - 1.030   pH 8.0 5.0 - 8.0   Glucose, UA NEGATIVE NEGATIVE mg/dL   Hgb urine dipstick NEGATIVE NEGATIVE   Bilirubin Urine NEGATIVE NEGATIVE   Ketones, ur NEGATIVE NEGATIVE mg/dL   Protein, ur >=300 (A) NEGATIVE mg/dL   Nitrite NEGATIVE NEGATIVE   Leukocytes,Ua MODERATE (A) NEGATIVE   RBC / HPF 0-5 0 - 5 RBC/hpf  WBC, UA 0-5 0 - 5 WBC/hpf   Bacteria, UA NONE SEEN NONE SEEN   Squamous Epithelial / LPF NONE SEEN 0 - 5    Comment: Performed at [facility], [address] [place], [place] [address]  Procalcitonin - Baseline     Status: None   Collection Time: 11/08/22  4:47 AM  Result Value Ref Range   Procalcitonin <0.10 ng/mL    Comment:        Interpretation: PCT (Procalcitonin) <= 0.5 ng/mL: Systemic infection (sepsis) is not likely. Local bacterial infection is possible. (NOTE)       Sepsis PCT Algorithm           Lower Respiratory Tract                                      Infection PCT Algorithm    ----------------------------     ----------------------------         PCT < 0.25  ng/mL                PCT < 0.10 ng/mL          Strongly encourage             Strongly discourage   discontinuation of antibiotics    initiation of antibiotics    ----------------------------     -----------------------------       PCT 0.25 - 0.50 ng/mL            PCT 0.10 - 0.25 ng/mL               OR       >80% decrease in PCT            Discourage initiation of                                            antibiotics      Encourage discontinuation           of antibiotics    ----------------------------     -----------------------------         PCT >= 0.50 ng/mL              PCT 0.26 - 0.50 ng/mL               AND        <80% decrease in PCT             Encourage initiation of                                             antibiotics       Encourage continuation           of antibiotics    ----------------------------     -----------------------------        PCT >= 0.50 ng/mL                  PCT > 0.50 ng/mL               AND         increase in PCT  Strongly encourage                                      initiation of antibiotics    Strongly encourage escalation           of antibiotics                                     -----------------------------                                           PCT <= 0.25 ng/mL                                                 OR                                        > 80% decrease in PCT                                      Discontinue / Do not initiate                                             antibiotics  Performed at [facility], [address] [place], [place] [address]   Resp panel by RT-PCR (RSV, Flu A&B, Covid) Anterior Nasal Swab     Status: None   Collection Time: 11/08/22  4:47 AM   Specimen: Anterior Nasal Swab  Result Value Ref Range   SARS Coronavirus 2 by RT PCR NEGATIVE NEGATIVE    Comment: (NOTE) SARS-CoV-2 target nucleic acids are NOT DETECTED.  The SARS-CoV-2 RNA is generally detectable in  upper respiratory specimens during the acute phase of infection. The lowest concentration of SARS-CoV-2 viral copies this assay can detect is 138 copies/mL. A negative result does not preclude SARS-Cov-2 infection and should not be used as the sole basis for treatment or other patient management decisions. A negative result may occur with  improper specimen collection/handling, submission of specimen other than nasopharyngeal swab, presence of viral mutation(s) within the areas targeted by this assay, and inadequate number of viral copies(<138 copies/mL). A negative result must be combined with clinical observations, patient history, and epidemiological information. The expected result is Negative.  Fact Sheet for Patients:  EntrepreneurPulse.com.au  Fact Sheet for Healthcare Providers:  IncredibleEmployment.be  This test is no t yet approved or cleared by the [place] FDA and  has been authorized for detection and/or diagnosis of SARS-CoV-2 by FDA under an Emergency Use Authorization (EUA). This EUA will remain  in effect (meaning this test can be used) for the duration of the COVID-19 declaration under Section 564(b)(1) of the Act, 21 U.S.C.section 360bbb-3(b)(1), unless the authorization is terminated  or revoked sooner.       Influenza  A by PCR NEGATIVE NEGATIVE   Influenza B by PCR NEGATIVE NEGATIVE    Comment: (NOTE) The Xpert Xpress SARS-CoV-2/FLU/RSV plus assay is intended as an aid in the diagnosis of influenza from Nasopharyngeal swab specimens and should not be used as a sole basis for treatment. Nasal washings and aspirates are unacceptable for Xpert Xpress SARS-CoV-2/FLU/RSV testing.  Fact Sheet for Patients: EntrepreneurPulse.com.au  Fact Sheet for Healthcare Providers: IncredibleEmployment.be  This test is not yet approved or cleared by the [place] FDA and has been authorized  for detection and/or diagnosis of SARS-CoV-2 by FDA under an Emergency Use Authorization (EUA). This EUA will remain in effect (meaning this test can be used) for the duration of the COVID-19 declaration under Section 564(b)(1) of the Act, 21 U.S.C. section 360bbb-3(b)(1), unless the authorization is terminated or revoked.     Resp Syncytial Virus by PCR NEGATIVE NEGATIVE    Comment: (NOTE) Fact Sheet for Patients: EntrepreneurPulse.com.au  Fact Sheet for Healthcare Providers: IncredibleEmployment.be  This test is not yet approved or cleared by the [place] FDA and has been authorized for detection and/or diagnosis of SARS-CoV-2 by FDA under an Emergency Use Authorization (EUA). This EUA will remain in effect (meaning this test can be used) for the duration of the COVID-19 declaration under Section 564(b)(1) of the Act, 21 U.S.C. section 360bbb-3(b)(1), unless the authorization is terminated or revoked.  Performed at [facility], [address], [place], [place] [address]   Urine Culture     Status: Abnormal (Preliminary result)   Collection Time: 11/08/22  4:47 AM   Specimen: Urine, Catheterized  Result Value Ref Range   Specimen Description      URINE, CATHETERIZED Performed at [facility], [address] [place], [place] [address]    Special Requests      NONE Performed at [facility], [address] [place], [place] [address]    Culture >=100,000 COLONIES/mL GRAM NEGATIVE RODS (A)    Report Status PENDING   Lactic acid, plasma     Status: Abnormal   Collection Time: 11/08/22  6:23 AM  Result Value Ref Range   Lactic Acid, Venous 5.6 (HH) 0.5 - 1.9 mmol/L    Comment: CRITICAL VALUE NOTED. VALUE IS CONSISTENT WITH PREVIOUSLY REPORTED/CALLED VALUE MW Performed at [facility], [place], [place] [address]   Troponin I (High Sensitivity)     Status: Abnormal   Collection  Time: 11/08/22  6:23 AM  Result Value Ref Range   Troponin I (High Sensitivity) 20 (H) <18 ng/L    Comment: (NOTE) Elevated high sensitivity troponin I (hsTnI) values and significant  changes across serial measurements may suggest ACS but many other  chronic and acute conditions are known to elevate hsTnI results.  Refer to the "Links" section for chest pain algorithms and additional  guidance. Performed at [facility], [address] [place], [place] [address]   Lactic acid, plasma     Status: Abnormal   Collection Time: 11/08/22  9:54 AM  Result Value Ref Range   Lactic Acid, Venous 3.6 (HH) 0.5 - 1.9 mmol/L    Comment: CRITICAL VALUE NOTED. VALUE IS CONSISTENT WITH PREVIOUSLY REPORTED/CALLED VALUE MW Performed at [facility], [place], [place] [address]   Lactic acid, plasma     Status: Abnormal   Collection Time: 11/08/22 12:00 PM  Result Value Ref Range   Lactic Acid, Venous 3.6 (HH) 0.5 - 1.9 mmol/L  Comment: CRITICAL VALUE NOTED. VALUE IS CONSISTENT WITH PREVIOUSLY REPORTED/CALLED VALUE MW Performed at [facility], [address] [place], [place] [address]   Lactic acid, plasma     Status: None   Collection Time: 11/08/22  6:11 PM  Result Value Ref Range   Lactic Acid, Venous 1.4 0.5 - 1.9 mmol/L    Comment: Performed at [facility], [address] [place], [place] [address]  CBC with Differential/Platelet     Status: Abnormal   Collection Time: 11/09/22  4:23 AM  Result Value Ref Range   WBC 19.0 (H) 4.0 - 10.5 K/uL   RBC 4.55 4.22 - 5.81 MIL/uL   Hemoglobin 15.7 13.0 - 17.0 g/dL   HCT 45.7 39.0 - 52.0 %   MCV 100.4 (H) 80.0 - 100.0 fL   MCH 34.5 (H) 26.0 - 34.0 pg   MCHC 34.4 30.0 - 36.0 g/dL   RDW 15.0 11.5 - 15.5 %   Platelets 144 (L) 150 - 400 K/uL   nRBC 0.0 0.0 - 0.2 %   Neutrophils Relative % 92 %   Neutro Abs 17.4 (H) 1.7 - 7.7 K/uL   Lymphocytes Relative 3 %   Lymphs Abs 0.5 (L) 0.7 - 4.0  K/uL   Monocytes Relative 4 %   Monocytes Absolute 0.8 0.1 - 1.0 K/uL   Eosinophils Relative 0 %   Eosinophils Absolute 0.1 0.0 - 0.5 K/uL   Basophils Relative 0 %   Basophils Absolute 0.1 0.0 - 0.1 K/uL   Immature Granulocytes 1 %   Abs Immature Granulocytes 0.17 (H) 0.00 - 0.07 K/uL    Comment: Performed at [facility], [address] [place], [place] [address]  Comprehensive metabolic panel     Status: Abnormal   Collection Time: 11/09/22  4:23 AM  Result Value Ref Range   Sodium 138 135 - 145 mmol/L   Potassium 4.1 3.5 - 5.1 mmol/L    Comment: HEMOLYSIS AT THIS LEVEL MAY AFFECT RESULT   Chloride 108 98 - 111 mmol/L   CO2 21 (L) 22 - 32 mmol/L   Glucose, Bld 142 (H) 70 - 99 mg/dL    Comment: Glucose reference range applies only to samples taken after fasting for at least 8 hours.   BUN 14 8 - 23 mg/dL   Creatinine, Ser 0.71 0.61 - 1.24 mg/dL   Calcium 8.6 (L) 8.9 - 10.3 mg/dL   Total Protein 7.5 6.5 - 8.1 g/dL   Albumin 3.1 (L) 3.5 - 5.0 g/dL   AST 116 (H) 15 - 41 U/L    Comment: HEMOLYSIS AT THIS LEVEL MAY AFFECT RESULT   ALT 69 (H) 0 - 44 U/L    Comment: HEMOLYSIS AT THIS LEVEL MAY AFFECT RESULT   Alkaline Phosphatase 80 38 - 126 U/L   Total Bilirubin 8.0 (H) 0.3 - 1.2 mg/dL    Comment: HEMOLYSIS AT THIS LEVEL MAY AFFECT RESULT   GFR, Estimated >60 >60 mL/min    Comment: (NOTE) Calculated using the CKD-EPI Creatinine Equation (2021)    Anion gap 9 5 - 15    Comment: Performed at [facility], [address] [place], [place] [address]  Lipase, blood     Status: None   Collection Time: 11/09/22  4:23 AM  Result Value Ref Range   Lipase 30 11 - 51 U/L    Comment: Performed at [facility], [address] [place],  [address]   CT Abdomen Pelvis W Contrast  Result Date: 11/08/2022 CLINICAL DATA:  76 year old male with history of acute onset of nonlocalized abdominal pain radiating into the right side. Emesis. History of rectal  cancer. * Tracking Code: BO * EXAM: CT ABDOMEN AND PELVIS WITH CONTRAST TECHNIQUE: Multidetector CT imaging of the abdomen and pelvis was performed using the standard protocol following bolus administration of intravenous contrast. RADIATION DOSE REDUCTION: This exam was performed according to the departmental dose-optimization program which includes automated exposure control, adjustment of the mA and/or kV according to patient size and/or use of iterative reconstruction technique. CONTRAST:  167m OMNIPAQUE IOHEXOL 300 MG/ML  SOLN COMPARISON:  CT of the abdomen and pelvis 03/05/2021. FINDINGS: Lower chest: Linear scarring or atelectasis in the right lower lobe. Small hiatal hernia. Atherosclerotic calcifications in the descending thoracic aorta as well as the right coronary artery. Hepatobiliary: Subcentimeter low-attenuation lesion in segment 4A of the liver, too small to characterize, but similar to the prior study and statistically likely tiny cysts (no imaging follow-up recommended). No other aggressive appearing hepatic lesions are noted. No intrahepatic biliary ductal dilatation. Common bile duct is dilated measuring 1 cm in the porta hepatis. There are multiple high attenuation stones in the distal common bile duct, largest of which measures up to 8 mm (coronal image 52 of series 5). Gallbladder is moderately distended with several small calcified gallstones lying dependently. Gallbladder wall thickness is normal. No pericholecystic fluid or surrounding inflammatory changes. Pancreas: No pancreatic mass. No pancreatic ductal dilatation noted on MRCP images. No pancreatic or peripancreatic fluid collections or inflammatory changes. Spleen: Unremarkable. Adrenals/Urinary Tract: Low-attenuation lesions in the right kidney measuring up to 2.2 cm in the posterior aspect of the upper pole, compatible with simple cysts (Bosniak class 1, no imaging follow-up recommended). Left kidney and bilateral adrenal glands  are otherwise unremarkable in appearance. No hydroureteronephrosis. Urinary bladder is largely decompressed, but otherwise unremarkable in appearance. Stomach/Bowel: The appearance of the stomach is unremarkable. There is no pathologic dilatation of small bowel or colon. Status post low anterior resection with left lower quadrant colostomy. Normal appendix. Vascular/Lymphatic: Atherosclerosis throughout the abdominal aorta and pelvic vasculature, without evidence of aneurysm or dissection. No lymphadenopathy noted in the abdomen or pelvis. Reproductive: Prostate gland and seminal vesicles are unremarkable in appearance. Other: No significant volume of ascites.  No pneumoperitoneum. Musculoskeletal: Chronic compression fractures of L4 and L5 are again noted, most severe at L4 where there is near complete loss of central vertebral body height, similar to the prior study. There are no aggressive appearing lytic or blastic lesions noted in the visualized portions of the skeleton. IMPRESSION: 1. Study is positive for obstructive choledocholithiasis with common bile duct measuring up to 1 cm in diameter. No intrahepatic biliary ductal dilatation is noted at this time. 2. Cholelithiasis without evidence of acute cholecystitis. 3. No definite signs of tumor recurrence or metastatic disease in the abdomen or pelvis. 4. Aortic atherosclerosis, in addition to at least right coronary artery disease. Assessment for potential risk factor modification, dietary therapy or pharmacologic therapy may be warranted, if clinically indicated. 5. Small hiatal hernia. 6. Additional incidental findings, as above. Electronically Signed   By: DVinnie LangtonM.D.   On: 11/08/2022 06:29   CT Head Wo Contrast  Result Date: 11/08/2022 CLINICAL DATA:  Mental status change with unknown cause EXAM: CT HEAD WITHOUT CONTRAST TECHNIQUE: Contiguous axial images were obtained from the base of the skull through the vertex without intravenous  contrast. RADIATION DOSE REDUCTION: This exam was performed according to  the departmental dose-optimization program which includes automated exposure control, adjustment of the mA and/or kV according to patient size and/or use of iterative reconstruction technique. COMPARISON:  03/05/2021 FINDINGS: Brain: No evidence of acute infarction, hemorrhage, hydrocephalus, extra-axial collection or mass lesion/mass effect. Generalized cortical atrophy. Vascular: No hyperdense vessel or unexpected calcification. Skull: Normal. Negative for fracture or focal lesion. Sinuses/Orbits: No acute finding. IMPRESSION: No acute or reversible finding. Generalized brain atrophy. Electronically Signed   By: Jorje Guild M.D.   On: 11/08/2022 06:21   DG Chest Port 1 View  Result Date: 11/08/2022 CLINICAL DATA:  76 year old male with history of sepsis. EXAM: PORTABLE CHEST 1 VIEW COMPARISON:  Chest x-ray 03/05/2021. FINDINGS: Lung volumes are low. Mild elevation of the right hemidiaphragm. Mild diffuse interstitial prominence. No consolidative airspace disease. No pleural effusions. No pneumothorax. Nodular density projecting over the left mid lung estimated measure 2.5 x 1.6 cm. Pulmonary vasculature and the cardiomediastinal silhouette are within normal limits. Atherosclerotic calcifications in the thoracic aorta. IMPRESSION: 1. Diffuse interstitial prominence, similar to prior studies, favored to be chronic. 2. New nodular density in the left mid lung concerning for potential neoplasm. Further evaluation with noncontrast chest CT should be considered. 3. Aortic atherosclerosis. Electronically Signed   By: Vinnie Langton M.D.   On: 11/08/2022 05:20    Anti-infectives (From admission, onward)    Start     Dose/Rate Route Frequency Ordered Stop   11/09/22 1200  piperacillin-tazobactam (ZOSYN) IVPB 3.375 g        3.375 g 12.5 mL/hr over 240 Minutes Intravenous Every 8 hours 11/09/22 0951 11/16/22 1359         Assessment/Plan Choledocholithiasis, concern for ascending cholangitis, without evidence of cholecystitis  - afebrile, BP elevated otherwise VSS - CT abd/pelvis with choledocholithiasis with dilation of the CBD, without intra-hepatic biliary dilation - GI was consulted and is planning ERCP for 12/28 - We are asked to see for consideration of laparoscopic cholecystectomy to prevent this from happening again. Patient would like to proceed with surgery if recommended. However, he is bedbound with multiple medical issues. He does not have a known history of heart disease. He has lower extremity edema and calcifications of the LAD and aorta on CT scan. Will obtain an echocardiogram to assess cardiac function and discuss preoperative risk stratification with primary team.   FEN - IVF, FLD, NPO after MN VTE - SCDs ID - [email or web address]  I reviewed nursing notes, Consultant GI notes, hospitalist notes, last 24 h vitals and pain scores, last 48 h intake and output, last 24 h labs and trends, and last 24 h imaging results.  Wellington Hampshire, [facility] 11/09/2022, 12:28 PM Please see Amion for pager number during day hours 7:00am-4:30pm or 7:00am -11:30am on weekends

## 2022-11-09 NOTE — Progress Notes (Signed)
Patient ID: Stephen [name], male   DOB: [birth year], 76 y.o.   MRN: [id number] Patient requesting pain medication. No orders in chart. [facility] paged. Instructed to contact Dr. [name]. Dr. Libby Maw. Will continue to monitor.  Haydee Salter, RN

## 2022-11-09 NOTE — H&P (View-Only) (Signed)
[name]: 8:20 AM 11/09/2022  LOS: 0 days    Referring Provider: Dr Stephen [name]  Primary Care Physician:  Stephen Jude, MD in [facility] [place] Primary Gastroenterologist:  unassigned  Wife is a Stephen [name].  Cell phone [phone]   Reason for Consultation:  choledocholithiasis.     HPI: Stephen [name] is a 76 y.o. male.  PMH rectal cancer 2014 with lung mets.  Treated with abdominal perineal resection with permanent colostomy 01/2014, chemo, LLL lung lobectomy 04/2014.  Cholelithiasis.  Hepatic steatosis dating back to at least 2018, mildly nodular hepatic contours on a CT from 04/2020.  Rheumatoid arthritis.  Peripheral neuropathy w bedbound status, requiring total care by spouse. No previous upper endoscopy. A couple of years ago patient was given about 6 months to live and was placed on hospice.  However he survived.  At that point he became bedbound after a prolonged period of several months bedrest.  For up to a year patient's been having right thoracic pain.  This is gotten worse in the last month.  For a couple of weeks has had markedly diminished stool output but stool remains brown.  Has chronic low-level nausea from "catarrh", this is not worse than usual.  Pruritus without skin eruption for a couple of years. With progression of the abdominal pain, presented to [facility] ED 12/26. Marland Kitchen  Noted short of breath, abdominal distention on exam.  Ruled in sepsis.  Received doses Maxipime, flagyl, vanc, fentanyl.    T. bili 3.7... 8. Alk phos 101... 80.  AST/ALT 167/55.. 116/69.  Lipase 34.   Lactate 5.6.. 3.6.   WBC 19 k.  Platelets 144.   Glucose 232.   CTAP w contrast: Obstructing choledocholithiasis.  CBD measuring up to 1 cm diameter.  Intrahepatic ducts not dilated.  Cholelithiasis but no changes of  cholecystitis.  PD and pancreas unremarkable.  No signs of metastatic disease or tumor recurrence.  Small HH.  Aortic atherosclerosis and coronary artery disease.  Chronic lumbar spine compression fractures without signs of lytic or blastic lesions.  Lives w wife in [place].   Drinks 3 mixed drinks w whiskey daily.  1.5 L   bottle lasts ~ 2 weeks.  Quit smoking cigarettes in 2022 Family history of CAD in mom, esophageal cancer in his father.  Breast cancer in his mother.  Brother with emphysema.  Paternal grandmother with colon cancer.  Past Medical History:  Diagnosis Date   Allergic rhinitis    Blood clot in vein 2015   left leg    History of anemia    History of chicken pox    HTN (hypertension)    Neuropathy    arms and legs - S/P Cancer tx   Rectal cancer metastasized to lung [facility]) 09-2013   surgery and chemo   Smoker     Past Surgical History:  Procedure Laterality Date   COLON RESECTION  2015   COLONOSCOPY  2015   EUS N/A 11/15/2013   Procedure: LOWER ENDOSCOPIC ULTRASOUND (EUS);  Surgeon: Stephen Banister, MD;  Location:  [facility];  Service: Endoscopy;  Laterality: N/A;   LUNG BIOPSY     LUNG LOBECTOMY Left    lung removed  2015   left lower    PLANTAR FASCIA RELEASE Right 09/20/2017   Procedure: PLANTAR FASCIA [id number], excision plantar fibroma right foot;  Surgeon: Stephen [name], DPM;  Location: [facility];  Service: Podiatry;  Laterality: Right;   TONSILLECTOMY AND ADENOIDECTOMY  Childhood   VARICOSE VEIN SURGERY  2010    Prior to Admission medications   Medication Sig Start Date End Date Taking? Authorizing Provider  albuterol (PROVENTIL) (2.5 MG/3ML) 0.083% nebulizer solution Take 3 mLs (2.5 mg total) by nebulization every 4 (four) hours as needed for wheezing or shortness of breath. 03/08/21   Stephen Mandes, MD  aspirin EC 81 MG tablet Take 81 mg by mouth daily.    [provider]  esomeprazole (NEXIUM) 20 MG packet Take 20 mg by mouth daily  before breakfast.    [provider]  gabapentin (NEURONTIN) 100 MG capsule Take 2 capsules (200 mg total) by mouth 2 (two) times daily. 02/24/21 04/25/21  Stephen Haff, MD  loratadine (CLARITIN) 10 MG tablet Take 10 mg by mouth daily as needed.    [provider]  Multiple Vitamin (MULTIVITAMIN WITH MINERALS) TABS tablet Take 1 tablet by mouth daily. 02/25/21   Stephen Haff, MD  oxyCODONE-acetaminophen (PERCOCET) 7.5-325 MG tablet Take 1 tablet by mouth every 6 (six) hours as needed for severe pain. 06/24/20   Stephen Feil, PA-C  vitamin B-12 (CYANOCOBALAMIN) 100 MCG tablet Take 100 mcg by mouth daily.    [provider]  vitamin C (ASCORBIC ACID) 250 MG tablet Take 250 mg by mouth daily.    [provider]  Vitamin D, Cholecalciferol, 25 MCG (1000 UT) TABS Take 1,000 Units by mouth daily.    [provider]    Scheduled Meds:  Infusions:  PRN Meds:    Allergies as of 11/08/2022   (No Known Allergies)    Family History  Problem Relation Age of Onset   Hypertension Mother    Coronary artery disease Mother        angina   Cancer Mother        breast   Cancer Father 44       esophageal   COPD Brother        emphysema   Cancer Paternal Grandmother        colon   Diabetes Neg Hx     Social History   Socioeconomic History   Marital status: Married    Spouse name: Not on file   Number of children: Not on file   Years of education: Not on file   Highest education level: Not on file  Occupational History   Not on file  Tobacco Use   Smoking status: Former    Packs/day: 0.50    Years: 15.00    Total pack years: 7.50    Types: Cigarettes    Quit date: 11/14/2020    Years since quitting: 1.9   Smokeless tobacco: Never  Vaping Use   Vaping Use: Never used  Substance and Sexual Activity   Alcohol use: Yes    Alcohol/week: 18.0 standard drinks of alcohol    Types: 18 Shots of liquor per week    Comment: Regular 2-3  scotch/day   Drug use: No   Sexual activity: Not on file  Other Topics Concern   Not on file  Social History  Narrative   Caffeine: 5-6 cups coffee/day   Lives with wife Stephen [name]), son and step son, no pets   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)   Edu: Management consultant   Activity: works outside   Diet: good water, daily fruits/vegetables   Social Determinants of Radio broadcast assistant Strain: Not on file  Food Insecurity: No Food Insecurity (11/09/2022)   Hunger Vital Sign    Worried About Running Out of Food in the Last Year: Never true    [place] in the Last Year: Never true  Transportation Needs: No Transportation Needs (11/09/2022)   PRAPARE - Hydrologist (Medical): No    Lack of Transportation (Non-Medical): No  Physical Activity: Not on file  Stress: Not on file  Social Connections: Not on file  Intimate Partner Violence: Not At Risk (11/09/2022)   Humiliation, Afraid, Rape, and Kick questionnaire    Fear of Current or Ex-Partner: No    Emotionally Abused: No    Physically Abused: No    Sexually Abused: No    REVIEW OF SYSTEMS: Constitutional:  Generally has weak muscle strength but no sense of malaise, weakness, fatigue ENT:  No nose bleeds.  Postnasal sinus drainage. Pulm: Some shortness of breath, not profound.  Chronic cough due to sinus drainage. CV:  No palpitations, no LE edema.  GU: Chronic condom cath.  No hematuria, no frequency GI: See HPI. Heme: Denies unusual or excessive bleeding or bruising. Transfusions: None. Neuro:  No visual disturbances.  No seizures or syncope.  Extremely weak lower legs Derm:  No itching, no rash or sores.  Endocrine:  No sweats or chills.  No polyuria or dysuria Immunization: Reviewed. Travel: Not queried.   PHYSICAL EXAM: Vital signs in last 24 hours: There were no vitals filed for this visit. Wt Readings from Last 3 Encounters:  11/08/22 89.8  kg  12/03/21 84.8 kg  03/05/21 73.2 kg    General: Patient looks somewhat chronically ill but not acutely ill or septic appearing.  Comfortable, alert and able to provide good history. Head: Facial asymmetry or swelling Eyes: Slight icterus. Ears: Slight HOH. Nose: No discharge or congestion. Mouth: Poor dentition.  Many teeth are absent and remaining teeth broken off and contain caries.  Mucosa is moist, pink, clear.  Tongue midline. Neck: No JVD, no masses, no thyromegaly Lungs: Clear bilaterally in front.  No labored breathing. Heart: RRR. Abdomen: Soft.  Minor tenderness in the right mid to upper abdomen without guarding or rebound.  Active bowel sounds.   Rectal: Not performed Musc/Skeltl: No marked sarcopenia or muscle wasting. Extremities: Edema in the ankles and feet bilaterally. Neurologic: Moves feet, toes, hands, fingers.  Did not test limb movement or strength.  No involuntary movement or tremor. Skin: Erythema at the tips of all fingers and palms of hands Nodes: No cervical adenopathy Psych: Calm, pleasant, cooperative.  Intake/Output from previous day: No intake/output data recorded. Intake/Output this shift: No intake/output data recorded.  LAB RESULTS: Recent Labs    11/08/22 0447 11/09/22 0423  WBC 19.0* 19.0*  HGB 17.6* 15.7  HCT 51.7 45.7  PLT 168 144*   BMET Lab Results  Component Value Date   NA 138 11/09/2022   NA 139 11/08/2022   NA 134 (L) 12/03/2021   K 4.1 11/09/2022   K 3.8 11/08/2022   K 3.9 12/03/2021   CL 108 11/09/2022   CL 102 11/08/2022   CL 102  12/03/2021   CO2 21 (L) 11/09/2022   CO2 22 11/08/2022   CO2 25 12/03/2021   GLUCOSE 142 (H) 11/09/2022   GLUCOSE 232 (H) 11/08/2022   GLUCOSE 112 (H) 12/03/2021   BUN 14 11/09/2022   BUN 15 11/08/2022   BUN 16 12/03/2021   CREATININE 0.71 11/09/2022   CREATININE 1.30 (H) 11/08/2022   CREATININE 1.24 12/03/2021   CALCIUM 8.6 (L) 11/09/2022   CALCIUM 9.3 11/08/2022   CALCIUM 9.0  12/03/2021   LFT Recent Labs    11/08/22 0447 11/09/22 0423  PROT 8.8* 7.5  ALBUMIN 4.0 3.1*  AST 167* 116*  ALT 55* 69*  ALKPHOS 101 80  BILITOT 3.7* 8.0*   PT/INR Lab Results  Component Value Date   INR 2.7 07/15/2014   INR 1.4 05/02/2014   INR 1.5 05/02/2014   Hepatitis Panel No results for input(s): "HEPBSAG", "HCVAB", "HEPAIGM", "HEPBIGM" in the last 72 hours. C-Diff No components found for: "CDIFF" Lipase     Component Value Date/Time   LIPASE 30 11/09/2022 0423   LIPASE 79 04/27/2014 2056    Drugs of Abuse     Component Value Date/Time   LABOPIA NONE DETECTED 03/05/2021 0810   COCAINSCRNUR NONE DETECTED 03/05/2021 0810   LABBENZ POSITIVE (A) 03/05/2021 0810   AMPHETMU NONE DETECTED 03/05/2021 0810   THCU NONE DETECTED 03/05/2021 0810   LABBARB NONE DETECTED 03/05/2021 0810     RADIOLOGY STUDIES: CT Abdomen Pelvis W Contrast  Result Date: 11/08/2022 CLINICAL DATA:  76 year old male with history of acute onset of nonlocalized abdominal pain radiating into the right side. Emesis. History of rectal cancer. * Tracking Code: BO * EXAM: CT ABDOMEN AND PELVIS WITH CONTRAST TECHNIQUE: Multidetector CT imaging of the abdomen and pelvis was performed using the standard protocol following bolus administration of intravenous contrast. RADIATION DOSE REDUCTION: This exam was performed according to the departmental dose-optimization program which includes automated exposure control, adjustment of the mA and/or kV according to patient size and/or use of iterative reconstruction technique. CONTRAST:  179m OMNIPAQUE IOHEXOL 300 MG/ML  SOLN COMPARISON:  CT of the abdomen and pelvis 03/05/2021. FINDINGS: Lower chest: Linear scarring or atelectasis in the right lower lobe. Small hiatal hernia. Atherosclerotic calcifications in the descending thoracic aorta as well as the right coronary artery. Hepatobiliary: Subcentimeter low-attenuation lesion in segment 4A of the liver, too small  to characterize, but similar to the prior study and statistically likely tiny cysts (no imaging follow-up recommended). No other aggressive appearing hepatic lesions are noted. No intrahepatic biliary ductal dilatation. Common bile duct is dilated measuring 1 cm in the porta hepatis. There are multiple high attenuation stones in the distal common bile duct, largest of which measures up to 8 mm (coronal image 52 of series 5). Gallbladder is moderately distended with several small calcified gallstones lying dependently. Gallbladder wall thickness is normal. No pericholecystic fluid or surrounding inflammatory changes. Pancreas: No pancreatic mass. No pancreatic ductal dilatation noted on MRCP images. No pancreatic or peripancreatic fluid collections or inflammatory changes. Spleen: Unremarkable. Adrenals/Urinary Tract: Low-attenuation lesions in the right kidney measuring up to 2.2 cm in the posterior aspect of the upper pole, compatible with simple cysts (Bosniak class 1, no imaging follow-up recommended). Left kidney and bilateral adrenal glands are otherwise unremarkable in appearance. No hydroureteronephrosis. Urinary bladder is largely decompressed, but otherwise unremarkable in appearance. Stomach/Bowel: The appearance of the stomach is unremarkable. There is no pathologic dilatation of small bowel or colon. Status post low anterior resection with  left lower quadrant colostomy. Normal appendix. Vascular/Lymphatic: Atherosclerosis throughout the abdominal aorta and pelvic vasculature, without evidence of aneurysm or dissection. No lymphadenopathy noted in the abdomen or pelvis. Reproductive: Prostate gland and seminal vesicles are unremarkable in appearance. Other: No significant volume of ascites.  No pneumoperitoneum. Musculoskeletal: Chronic compression fractures of L4 and L5 are again noted, most severe at L4 where there is near complete loss of central vertebral body height, similar to the prior study. There  are no aggressive appearing lytic or blastic lesions noted in the visualized portions of the skeleton. IMPRESSION: 1. Study is positive for obstructive choledocholithiasis with common bile duct measuring up to 1 cm in diameter. No intrahepatic biliary ductal dilatation is noted at this time. 2. Cholelithiasis without evidence of acute cholecystitis. 3. No definite signs of tumor recurrence or metastatic disease in the abdomen or pelvis. 4. Aortic atherosclerosis, in addition to at least right coronary artery disease. Assessment for potential risk factor modification, dietary therapy or pharmacologic therapy may be warranted, if clinically indicated. 5. Small hiatal hernia. 6. Additional incidental findings, as above. Electronically Signed   By: Vinnie Langton M.D.   On: 11/08/2022 06:29   CT Head Wo Contrast  Result Date: 11/08/2022 CLINICAL DATA:  Mental status change with unknown cause EXAM: CT HEAD WITHOUT CONTRAST TECHNIQUE: Contiguous axial images were obtained from the base of the skull through the vertex without intravenous contrast. RADIATION DOSE REDUCTION: This exam was performed according to the departmental dose-optimization program which includes automated exposure control, adjustment of the mA and/or kV according to patient size and/or use of iterative reconstruction technique. COMPARISON:  03/05/2021 FINDINGS: Brain: No evidence of acute infarction, hemorrhage, hydrocephalus, extra-axial collection or mass lesion/mass effect. Generalized cortical atrophy. Vascular: No hyperdense vessel or unexpected calcification. Skull: Normal. Negative for fracture or focal lesion. Sinuses/Orbits: No acute finding. IMPRESSION: No acute or reversible finding. Generalized brain atrophy. Electronically Signed   By: Jorje Guild M.D.   On: 11/08/2022 06:21   DG Chest Port 1 View  Result Date: 11/08/2022 CLINICAL DATA:  76 year old male with history of sepsis. EXAM: PORTABLE CHEST 1 VIEW COMPARISON:  Chest  x-ray 03/05/2021. FINDINGS: Lung volumes are low. Mild elevation of the right hemidiaphragm. Mild diffuse interstitial prominence. No consolidative airspace disease. No pleural effusions. No pneumothorax. Nodular density projecting over the left mid lung estimated measure 2.5 x 1.6 cm. Pulmonary vasculature and the cardiomediastinal silhouette are within normal limits. Atherosclerotic calcifications in the thoracic aorta. IMPRESSION: 1. Diffuse interstitial prominence, similar to prior studies, favored to be chronic. 2. New nodular density in the left mid lung concerning for potential neoplasm. Further evaluation with noncontrast chest CT should be considered. 3. Aortic atherosclerosis. Electronically Signed   By: Vinnie Langton M.D.   On: 11/08/2022 05:20      IMPRESSION:     Choledocholithiasis.  Likely cholangitis w sepsis.      Lung nodule.  Concerning for neoplasm.  Status post LLL wedge resection for lung mets of colon cancer, 2015.  Rectal cancer treated with AP resection, permanent colostomy, chemotherapy 2015.    Elevated serum glucose.  No diabetes meds PTA.      Thrombocytopenia, mild.     PLAN:     ERCP tomorrow afternoon with Dr. [name].  Risks of pancreatitis, bile duct or bowel perforation, inability to remove stone and possible need for stent placement discussed with patient and his wife.  They are eager to proceed.  Continue antibiotics.  Check PT/INR  Azucena Freed  11/09/2022, 8:20 AM Phone [phone]

## 2022-11-10 ENCOUNTER — Inpatient Hospital Stay (HOSPITAL_COMMUNITY): Payer: Medicare HMO

## 2022-11-10 ENCOUNTER — Encounter (HOSPITAL_COMMUNITY): Payer: Self-pay | Admitting: Internal Medicine

## 2022-11-10 ENCOUNTER — Inpatient Hospital Stay (HOSPITAL_COMMUNITY): Payer: Medicare HMO | Admitting: Certified Registered"

## 2022-11-10 ENCOUNTER — Encounter (HOSPITAL_COMMUNITY)
Admission: RE | Disposition: A | Discharge status: Home Health | Payer: Self-pay | Source: Ambulatory Visit | Attending: Internal Medicine

## 2022-11-10 DIAGNOSIS — I110 Hypertensive heart disease with heart failure: Secondary | ICD-10-CM | POA: Diagnosis not present

## 2022-11-10 DIAGNOSIS — K8050 Calculus of bile duct without cholangitis or cholecystitis without obstruction: Secondary | ICD-10-CM

## 2022-11-10 DIAGNOSIS — K810 Acute cholecystitis: Secondary | ICD-10-CM | POA: Diagnosis not present

## 2022-11-10 DIAGNOSIS — I509 Heart failure, unspecified: Secondary | ICD-10-CM

## 2022-11-10 DIAGNOSIS — K3189 Other diseases of stomach and duodenum: Secondary | ICD-10-CM | POA: Diagnosis not present

## 2022-11-10 DIAGNOSIS — Z87891 Personal history of nicotine dependence: Secondary | ICD-10-CM

## 2022-11-10 DIAGNOSIS — K449 Diaphragmatic hernia without obstruction or gangrene: Secondary | ICD-10-CM | POA: Diagnosis not present

## 2022-11-10 HISTORY — PX: ENDOSCOPIC RETROGRADE CHOLANGIOPANCREATOGRAPHY (ERCP) WITH PROPOFOL: SHX5810

## 2022-11-10 LAB — CBC
HCT: 43.7 % (ref 39.0–52.0)
Hemoglobin: 15.6 g/dL (ref 13.0–17.0)
MCH: 35.5 pg — ABNORMAL HIGH (ref 26.0–34.0)
MCHC: 35.7 g/dL (ref 30.0–36.0)
MCV: 99.5 fL (ref 80.0–100.0)
Platelets: 99 10*3/uL — ABNORMAL LOW (ref 150–400)
RBC: 4.39 MIL/uL (ref 4.22–5.81)
RDW: 15 % (ref 11.5–15.5)
WBC: 12.5 10*3/uL — ABNORMAL HIGH (ref 4.0–10.5)
nRBC: 0 % (ref 0.0–0.2)

## 2022-11-10 LAB — COMPREHENSIVE METABOLIC PANEL
ALT: 76 U/L — ABNORMAL HIGH (ref 0–44)
AST: 90 U/L — ABNORMAL HIGH (ref 15–41)
Albumin: 2.9 g/dL — ABNORMAL LOW (ref 3.5–5.0)
Alkaline Phosphatase: 86 U/L (ref 38–126)
Anion gap: 8 (ref 5–15)
BUN: 11 mg/dL (ref 8–23)
CO2: 23 mmol/L (ref 22–32)
Calcium: 9 mg/dL (ref 8.9–10.3)
Chloride: 102 mmol/L (ref 98–111)
Creatinine, Ser: 1.22 mg/dL (ref 0.61–1.24)
GFR, Estimated: >60 mL/min (ref >60)
Glucose, Bld: 119 mg/dL — ABNORMAL HIGH (ref 70–99)
Potassium: 4.1 mmol/L (ref 3.5–5.1)
Sodium: 133 mmol/L — ABNORMAL LOW (ref 135–145)
Total Bilirubin: 8.5 mg/dL — ABNORMAL HIGH (ref 0.3–1.2)
Total Protein: 7.2 g/dL (ref 6.5–8.1)

## 2022-11-10 SURGERY — ENDOSCOPIC RETROGRADE CHOLANGIOPANCREATOGRAPHY (ERCP) WITH PROPOFOL
Anesthesia: General

## 2022-11-10 MED ORDER — CIPROFLOXACIN IN D5W 400 MG/200ML IV SOLN
INTRAVENOUS | Status: AC
  Filled 2022-11-10: qty 200

## 2022-11-10 MED ORDER — GLUCAGON HCL RDNA (DIAGNOSTIC) 1 MG IJ SOLR
INTRAMUSCULAR | Status: AC
  Filled 2022-11-10: qty 1

## 2022-11-10 MED ORDER — INDOMETHACIN 50 MG RE SUPP
RECTAL | Status: AC
  Filled 2022-11-10: qty 2

## 2022-11-10 MED ORDER — CHLORHEXIDINE GLUCONATE CLOTH 2 % EX PADS
6.0000 | MEDICATED_PAD | Freq: Every day | CUTANEOUS | Status: DC
  Administered 2022-11-10: 6 via TOPICAL

## 2022-11-10 MED ORDER — ONDANSETRON HCL 4 MG/2ML IJ SOLN
INTRAMUSCULAR | Status: DC | PRN
  Administered 2022-11-10: 4 mg via INTRAVENOUS

## 2022-11-10 MED ORDER — PHENYLEPHRINE HCL-NACL 20-0.9 MG/250ML-% IV SOLN
INTRAVENOUS | Status: DC | PRN
  Administered 2022-11-10: 40 ug/min via INTRAVENOUS

## 2022-11-10 MED ORDER — LACTATED RINGERS IV SOLN: INTRAVENOUS | Status: DC | PRN

## 2022-11-10 MED ORDER — SUGAMMADEX SODIUM 200 MG/2ML IV SOLN
INTRAVENOUS | Status: DC | PRN
  Administered 2022-11-10: 200 mg via INTRAVENOUS

## 2022-11-10 MED ORDER — PANTOPRAZOLE SODIUM 40 MG IV SOLR
40.0000 mg | Freq: Two times a day (BID) | INTRAVENOUS | Status: DC
  Administered 2022-11-10 – 2022-11-17 (×15): 40 mg via INTRAVENOUS
  Filled 2022-11-10 (×16): qty 10

## 2022-11-10 MED ORDER — ROCURONIUM BROMIDE 10 MG/ML (PF) SYRINGE
PREFILLED_SYRINGE | INTRAVENOUS | Status: DC | PRN
  Administered 2022-11-10: 40 mg via INTRAVENOUS

## 2022-11-10 MED ORDER — IOHEXOL 300 MG/ML  SOLN
50.0000 mL | Freq: Once | INTRAMUSCULAR | Status: AC | PRN
  Administered 2022-11-10: 50 mL via ORAL

## 2022-11-10 MED ORDER — LIDOCAINE 2% (20 MG/ML) 5 ML SYRINGE
INTRAMUSCULAR | Status: DC | PRN
  Administered 2022-11-10: 40 mg via INTRAVENOUS

## 2022-11-10 MED ORDER — PHENYLEPHRINE 80 MCG/ML (10ML) SYRINGE FOR IV PUSH (FOR BLOOD PRESSURE SUPPORT)
PREFILLED_SYRINGE | INTRAVENOUS | Status: DC | PRN
  Administered 2022-11-10: 80 ug via INTRAVENOUS

## 2022-11-10 MED ORDER — DICLOFENAC SUPPOSITORY 100 MG: 100.0000 mg | Freq: Once | RECTAL | Status: DC

## 2022-11-10 MED ORDER — PROPOFOL 10 MG/ML IV BOLUS
INTRAVENOUS | Status: DC | PRN
  Administered 2022-11-10: 70 mg via INTRAVENOUS

## 2022-11-10 MED ORDER — DICLOFENAC SUPPOSITORY 100 MG
RECTAL | Status: AC
  Filled 2022-11-10: qty 1

## 2022-11-10 MED ORDER — FENTANYL CITRATE (PF) 250 MCG/5ML IJ SOLN
INTRAMUSCULAR | Status: DC | PRN
  Administered 2022-11-10 (×2): 25 ug via INTRAVENOUS

## 2022-11-10 MED ORDER — DEXAMETHASONE SODIUM PHOSPHATE 10 MG/ML IJ SOLN
INTRAMUSCULAR | Status: DC | PRN
  Administered 2022-11-10: 5 mg via INTRAVENOUS

## 2022-11-10 NOTE — Anesthesia Postprocedure Evaluation (Signed)
Anesthesia Post Note  Patient: Stephen [name]  Procedure(s) Performed: CANCELLED PROCEDURE ESOPHAGOGASTRODUODENOSCOPY (EGD)     Patient location during evaluation: PACU Anesthesia Type: General Level of consciousness: awake Pain management: pain level controlled Vital Signs Assessment: post-procedure vital signs reviewed and stable Respiratory status: spontaneous breathing, nonlabored ventilation and respiratory function stable Cardiovascular status: blood pressure returned to baseline and stable Postop Assessment: no apparent nausea or vomiting Anesthetic complications: no   No notable events documented.  Last Vitals:  Vitals:   11/10/22 1658 11/10/22 1954  BP: 113/74 110/61  Pulse: 69 68  Resp: 16 16  Temp: 36.6 C (!) 36.4 C  SpO2: 95% 94%    Last Pain:  Vitals:   11/10/22 1954  TempSrc: Oral  PainSc:                  Nilda Simmer

## 2022-11-10 NOTE — Anesthesia Procedure Notes (Signed)
Procedure Name: Intubation Date/Time: 11/10/2022 1:21 PM  Performed by: Gaylene Brooks, CRNAPre-anesthesia Checklist: Patient identified, Emergency Drugs available, Suction available and Patient being monitored Patient Re-evaluated:Patient Re-evaluated prior to induction Oxygen Delivery Method: Circle System Utilized Preoxygenation: Pre-oxygenation with 100% oxygen Induction Type: IV induction Ventilation: Mask ventilation without difficulty Laryngoscope Size: Miller and 2 Grade View: Grade I Tube type: Oral Tube size: 7.5 mm Number of attempts: 1 Airway Equipment and Method: Stylet and Oral airway Placement Confirmation: ETT inserted through vocal cords under direct vision, positive ETCO2 and breath sounds checked- equal and bilateral Secured at: 22 cm Tube secured with: Tape Dental Injury: Teeth and Oropharynx as per pre-operative assessment

## 2022-11-10 NOTE — Progress Notes (Signed)
Patient ID: Stephen [name], male   DOB: [birth year], 76 y.o.   MRN: [id number] [facility] Surgery Progress Note:   * No surgery date entered *   THE PLAN  ERCP today.  Patient wants to discuss lap chole despite his comorbid conditions  Subjective: Mental status is clear.  Complaints none. Objective: Vital signs in last 24 hours: Temp:  [97.8 F (36.6 C)-99 F (37.2 C)] 99 F (37.2 C) (12/28 0858) Pulse Rate:  [73-117] 108 (12/28 0858) Resp:  [17-20] 17 (12/28 0858) BP: (141-160)/(80-114) 143/80 (12/28 0858) SpO2:  [91 %-93 %] 91 % (12/28 0858)  Intake/Output from previous day: 12/27 0701 - 12/28 0700 In: 1397.2 [P.O.:120; I.V.:1175.1; IV Piggyback:102.1] Out: 1350 [Urine:1350] Intake/Output this shift: No intake/output data recorded.  Physical Exam: Work of breathing is not labored.  Non tender  Lab Results:  Results for orders placed or performed during the hospital encounter of 11/09/22 (from the past 48 hour(s))  Protime-INR     Status: Abnormal   Collection Time: 11/09/22  4:20 PM  Result Value Ref Range   Prothrombin Time 18.0 (H) 11.4 - 15.2 seconds   INR 1.5 (H) 0.8 - 1.2    Comment: (NOTE) INR goal varies based on device and disease states. Performed at [facility], [place] [address] [place], [place] [address]   Comprehensive metabolic panel     Status: Abnormal   Collection Time: 11/10/22  2:09 AM  Result Value Ref Range   Sodium 133 (L) 135 - 145 mmol/L   Potassium 4.1 3.5 - 5.1 mmol/L   Chloride 102 98 - 111 mmol/L   CO2 23 22 - 32 mmol/L   Glucose, Bld 119 (H) 70 - 99 mg/dL    Comment: Glucose reference range applies only to samples taken after fasting for at least 8 hours.   BUN 11 8 - 23 mg/dL   Creatinine, Ser 1.22 0.61 - 1.24 mg/dL   Calcium 9.0 8.9 - 10.3 mg/dL   Total Protein 7.2 6.5 - 8.1 g/dL   Albumin 2.9 (L) 3.5 - 5.0 g/dL   AST 90 (H) 15 - 41 U/L   ALT 76 (H) 0 - 44 U/L   Alkaline Phosphatase 86 38 - 126 U/L   Total Bilirubin  8.5 (H) 0.3 - 1.2 mg/dL   GFR, Estimated >60 >60 mL/min    Comment: (NOTE) Calculated using the CKD-EPI Creatinine Equation (2021)    Anion gap 8 5 - 15    Comment: Performed at [place] [address] [place], [place] [address]  CBC     Status: Abnormal   Collection Time: 11/10/22  2:09 AM  Result Value Ref Range   WBC 12.5 (H) 4.0 - 10.5 K/uL   RBC 4.39 4.22 - 5.81 MIL/uL   Hemoglobin 15.6 13.0 - 17.0 g/dL   HCT 43.7 39.0 - 52.0 %   MCV 99.5 80.0 - 100.0 fL   MCH 35.5 (H) 26.0 - 34.0 pg   MCHC 35.7 30.0 - 36.0 g/dL   RDW 15.0 11.5 - 15.5 %   Platelets 99 (L) 150 - 400 K/uL    Comment: SPECIMEN CHECKED FOR CLOTS Immature Platelet Fraction may be clinically indicated, consider ordering this additional test [id number] REPEATED TO VERIFY PLATELET COUNT CONFIRMED BY SMEAR    nRBC 0.0 0.0 - 0.2 %    Comment: Performed at [facility], [place] [address], [place], [place] [address]    Radiology/Results: DG Abd Portable 2V  Result Date: 11/10/2022 CLINICAL DATA:  Constipation and nausea in a 76 year old male. EXAM: PORTABLE ABDOMEN - 2 VIEW COMPARISON:  CT imaging from November 08, 2022. FINDINGS: Study is limited by patient body habitus. EKG leads project over the chest and abdomen. Global dilation of small bowel loops. These are moderately dilated up to 3 cm in some areas. Scattered gas-filled loops of large bowel. No free air beneath either the RIGHT or LEFT hemidiaphragm. Mild moderate gastric distension with small hiatal hernia. LEFT lower quadrant colostomy noted on prior CT. On limited assessment no acute regional skeletal findings. IMPRESSION: 1. Global dilation of small bowel loops. Findings could reflect developing small bowel obstruction or ileus. Attention on follow-up. 2. No free air. 3. Signs of hiatal hernia. Electronically Signed   By: Zetta Bills M.D.   On: 11/10/2022 10:52   ECHOCARDIOGRAM COMPLETE  Result Date: 11/09/2022    ECHOCARDIOGRAM REPORT    Patient Name:   Othal Kubitz Date of Exam: 11/09/2022 Medical Rec #:  [id number]       Height:       67.0 in Accession #:    [id number]      Weight:       198.0 lb Date of Birth:  12-15-1945       BSA:          2.014 m Patient Age:    42 years        BP:           141/77 mmHg Patient Gender: M               HR:           93 bpm. Exam Location:  Inpatient Procedure: 2D Echo, Color Doppler, Cardiac Doppler and Intracardiac            Opacification Agent STAT ECHO Indications:    Preoperative evaluation  History:        Patient has no prior history of Echocardiogram examinations.                 Risk Factors:Former Smoker, Hypertension and ETOH. Hx of cancer                 s/p chemotherapy.  Sonographer:    Eartha Inch Referring Phys: Margie Billet, A  Sonographer Comments: Technically difficult study due to poor echo windows. Image acquisition challenging due to patient body habitus and Image acquisition challenging due to respiratory motion. IMPRESSIONS  1. Left ventricular ejection fraction, by estimation, is 60 to 65%. The left ventricle has normal function. The left ventricle has no regional wall motion abnormalities. Left ventricular diastolic parameters are consistent with Grade I diastolic dysfunction (impaired relaxation).  2. Right ventricular systolic function is normal. The right ventricular size is normal. Tricuspid regurgitation signal is inadequate for assessing PA pressure.  3. The mitral valve is grossly normal. Trivial mitral valve regurgitation.  4. The aortic valve is tricuspid. Aortic valve regurgitation is not visualized. Aortic valve sclerosis is present, with no evidence of aortic valve stenosis.  5. Aortic dilatation noted. There is borderline dilatation of the aortic root, measuring 38 mm.  6. The inferior vena cava is normal in size with greater than 50% respiratory variability, suggesting right atrial pressure of 3 mmHg. Comparison(s): No prior Echocardiogram. FINDINGS  Left  Ventricle: Left ventricular ejection fraction, by estimation, is 60 to 65%. The left ventricle has normal function. The left ventricle has no regional wall motion abnormalities. Definity contrast agent was  given IV to delineate the left ventricular  endocardial borders. The left ventricular internal cavity size was normal in size. There is no left ventricular hypertrophy. Left ventricular diastolic parameters are consistent with Grade I diastolic dysfunction (impaired relaxation). Right Ventricle: The right ventricular size is normal. No increase in right ventricular wall thickness. Right ventricular systolic function is normal. Tricuspid regurgitation signal is inadequate for assessing PA pressure. Left Atrium: Left atrial size was normal in size. Right Atrium: Right atrial size was normal in size. Pericardium: There is no evidence of pericardial effusion. Mitral Valve: The mitral valve is grossly normal. Trivial mitral valve regurgitation. MV peak gradient, 3.2 mmHg. The mean mitral valve gradient is 1.0 mmHg. Tricuspid Valve: The tricuspid valve is normal in structure. Tricuspid valve regurgitation is not demonstrated. Aortic Valve: The aortic valve is tricuspid. Aortic valve regurgitation is not visualized. Aortic valve sclerosis is present, with no evidence of aortic valve stenosis. Pulmonic Valve: The pulmonic valve was normal in structure. Pulmonic valve regurgitation is trivial. Aorta: Aortic dilatation noted. There is borderline dilatation of the aortic root, measuring 38 mm. Venous: The inferior vena cava is normal in size with greater than 50% respiratory variability, suggesting right atrial pressure of 3 mmHg. IAS/Shunts: The atrial septum is grossly normal.  LEFT VENTRICLE PLAX 2D LVIDd:         3.90 cm   Diastology LVIDs:         3.00 cm   LV e' medial:    5.98 cm/s LV PW:         0.90 cm   LV E/e' medial:  9.3 LV IVS:        0.80 cm   LV e' lateral:   10.20 cm/s LVOT diam:     2.10 cm   LV E/e'  lateral: 5.4 LV SV:         58 LV SV Index:   29 LVOT Area:     3.46 cm  RIGHT VENTRICLE             IVC RV S prime:     14.50 cm/s  IVC diam: 0.70 cm TAPSE (M-mode): 1.7 cm LEFT ATRIUM             Index        RIGHT ATRIUM           Index LA diam:        3.10 cm 1.54 cm/m   RA Area:     15.90 cm LA Vol (A2C):   38.0 ml 18.87 ml/m  RA Volume:   44.40 ml  22.05 ml/m LA Vol (A4C):   41.4 ml 20.56 ml/m LA Biplane Vol: 41.7 ml 20.71 ml/m  AORTIC VALVE LVOT Vmax:   89.40 cm/s LVOT Vmean:  64.800 cm/s LVOT VTI:    0.168 m  AORTA Ao Root diam: 3.80 cm MITRAL VALVE MV Area (PHT): 2.45 cm    SHUNTS MV Area VTI:   2.69 cm    Systemic VTI:  0.17 m MV Peak grad:  3.2 mmHg    Systemic Diam: 2.10 cm MV Mean grad:  1.0 mmHg MV Vmax:       0.89 m/s MV Vmean:      56.0 cm/s MV Decel Time: 310 msec MV E velocity: 55.50 cm/s MV A velocity: 78.60 cm/s MV E/A ratio:  0.71 Gwyndolyn Kaufman MD Electronically signed by Gwyndolyn Kaufman MD Signature Date/Time: 11/09/2022/2:19:04 PM    Final     Anti-infectives: Anti-infectives (From  admission, onward)    Start     Dose/Rate Route Frequency Ordered Stop   11/09/22 1200  piperacillin-tazobactam (ZOSYN) IVPB 3.375 g        3.375 g 12.5 mL/hr over 240 Minutes Intravenous Every 8 hours 11/09/22 0951 11/16/22 1359       Assessment/Plan: Problem List: Patient Active Problem List   Diagnosis Date Noted   Choledocholithiasis with acute cholecystitis with obstruction 11/09/2022   Lung nodule, solitary 11/09/2022   Chronic diastolic CHF (congestive heart failure) ([facility]) 11/09/2022   Alcohol abuse 11/09/2022   DNR (do not resuscitate) 11/09/2022   Sepsis ([place]) 11/08/2022   Pressure injury of skin 03/06/2021   Acute on chronic respiratory failure with hypoxia ([facility]) 03/05/2021   Malnutrition of moderate degree 02/20/2021   Alcohol withdrawal syndrome ([place]) 02/20/2021   Aspiration into lower respiratory tract, sequela 02/19/2021   UTI (urinary tract infection)  02/19/2021   Acute respiratory failure with hypoxia ([place]) 02/19/2021   Aspiration pneumonia ([place]) 02/19/2021   Partial small bowel obstruction ([place]) 02/19/2021   Severe sepsis ([place]) 02/19/2021   GERD (gastroesophageal reflux disease) 02/19/2021   Depression 02/19/2021   Alcohol use 02/19/2021   Rectal cancer ([place]) 11/15/2013   History of anemia    HTN (hypertension)    Smoker     Possible lap chole or observation after his ERCP for choledocholithiasis.   * No surgery date entered *    LOS: 1 day   Matt B. Hassell Done, MD, [facility] [phone] to reach the surgeon on call.    11/10/2022 12:18 PM

## 2022-11-10 NOTE — Interval H&P Note (Signed)
History and Physical Interval Note:  11/10/2022 12:36 PM  Stephen [name]  has presented today for surgery, with the diagnosis of Choledocholithiasis.  Likely cholangitis, sepsis.  The various methods of treatment have been discussed with the patient and family. After consideration of risks, benefits and other options for treatment, the patient has consented to  Procedure(s): ENDOSCOPIC RETROGRADE CHOLANGIOPANCREATOGRAPHY (ERCP) WITH PROPOFOL (N/A) as a surgical intervention.  The patient's history has been reviewed, patient examined, no change in status, stable for surgery.  I have reviewed the patient's chart and labs.  Questions were answered to the patient's satisfaction.     [name]

## 2022-11-10 NOTE — Transfer of Care (Signed)
Immediate Anesthesia Transfer of Care Note  Patient: Stephen [name]  Procedure(s) Performed: ENDOSCOPIC RETROGRADE CHOLANGIOPANCREATOGRAPHY (ERCP) WITH PROPOFOL CANCELLED PROCEDURE  Patient Location: Endoscopy Unit  Anesthesia Type:General  Level of Consciousness: awake, drowsy, and patient cooperative  Airway & Oxygen Therapy: Patient Spontanous Breathing and Patient connected to face mask oxygen  Post-op Assessment: Report given to RN, Post -op Vital signs reviewed and stable, and Patient moving all extremities X 4  Post vital signs: Reviewed and stable  Last Vitals:  Vitals Value Taken Time  BP 148/74 11/10/22 1420  Temp    Pulse 88 11/10/22 1420  Resp 32 11/10/22 1420  SpO2 90 % 11/10/22 1420  Vitals shown include unvalidated device data.  Last Pain:  Vitals:   11/10/22 1227  TempSrc: Temporal  PainSc: 3       Patients Stated Pain Goal: 0 (63/14/97 0263)  Complications: No notable events documented.

## 2022-11-10 NOTE — Anesthesia Preprocedure Evaluation (Signed)
Anesthesia Evaluation  Patient identified by MRN, date of birth, ID band Patient awake    Reviewed: Allergy & Precautions, NPO status , Patient's Chart, lab work & pertinent test results  Airway Mallampati: II  TM Distance: >3 FB Neck ROM: Full    Dental  (+) Dental Advisory Given   Pulmonary Patient abstained from smoking., former smoker Lung CA s/p LLL resection    breath sounds clear to auscultation       Cardiovascular hypertension, +CHF   Rhythm:Regular Rate:Normal     Neuro/Psych negative neurological ROS     GI/Hepatic ,GERD  Medicated,,Elevated LFT's with CBD dilation   Endo/Other  negative endocrine ROS    Renal/GU negative Renal ROS     Musculoskeletal   Abdominal   Peds  Hematology negative hematology ROS (+)   Anesthesia Other Findings   Reproductive/Obstetrics                             Anesthesia Physical Anesthesia Plan  ASA: 3  Anesthesia Plan: General   Post-op Pain Management: Minimal or no pain anticipated   Induction: Intravenous  PONV Risk Score and Plan: 2 and Dexamethasone, Ondansetron and Treatment may vary due to age or medical condition  Airway Management Planned: Oral ETT  Additional Equipment:   Intra-op Plan:   Post-operative Plan: Extubation in OR  Informed Consent: I have reviewed the patients History and Physical, chart, labs and discussed the procedure including the risks, benefits and alternatives for the proposed anesthesia with the patient or authorized representative who has indicated his/her understanding and acceptance.   Patient has DNR.  Discussed DNR with patient and Suspend DNR.     Plan Discussed with: CRNA  Anesthesia Plan Comments:        Anesthesia Quick Evaluation

## 2022-11-10 NOTE — Progress Notes (Signed)
PROGRESS NOTE    Stephen [name]  HQI:[id number] DOB: [birth year] DOA: 11/09/2022 PCP: [name], MD   Brief Narrative: 76 year old with past medical history significant for hypertension and metastatic rectal cancer 2014 with Foley and colostomy who presented to [facility] with abdominal pain, sepsis.  He developed acute abdominal pain and vomiting.  Started 2 or 3 days prior to admission.  Of note he has distal finger > toe pulp erythema with interosseous muscle atrophy.  He also has had progressive weakness over time and is now predominantly nonambulatory.  He presented with lactic acid 3.5>>> 5.6, leukocytosis 19 K, sepsis source of infection choledocholithiasis cholangitis, AST 167 ALT 55 bili 3.7.   Assessment & Plan:   Active Problems:   HTN (hypertension)   Rectal cancer (HCC)   Choledocholithiasis with acute cholecystitis with obstruction   Lung nodule, solitary   Chronic diastolic CHF (congestive heart failure) ([place])   Alcohol abuse   DNR (do not resuscitate)   1-Choledocholithiasis with cholangitis: -Presents with transaminases and abdominal pain.  -CT abdomen pelvis: positive for obstructive choledocholithiasis with common bile duct measuring up to 1 cm in diameter. No intrahepatic biliary ductal dilatation is noted at this time.  Cholelithiasis without evidence of acute cholecystitis. -Plan for ERCP today.  -Continue with IV Zosyn.  -surgery following for consideration cholecystectomy.  Pre op Clearance: See Dr [name] Note.  PRN Zofran for nausea, IV Protonix.   Lung nodule: Needs Out patient follow up/   Chronic Diastolic heart failure Compensated.  Monitor on IV fluids.   Hypertension: PRN Hydralazine.   Remote history of metastatic rectal cancer Colostomy with poor out put.  KUB; Global dilation of small bowel loops. Findings could reflect developing small bowel obstruction or ileus. Attention on follow-up. Will informed General sx.   Alcohol  abuse: Drinks 2-3 drinks per day.  Monitor for withdrawal.   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    Pressure Injury 03/05/21 Coccyx Medial Stage 2 -  Partial thickness loss of dermis presenting as a shallow open injury with a red, pink wound bed without slough. stage 2 pressure injury surrounded by DTI on coxxyx/sacrum (Active)  03/05/21 1639  Location: Coccyx  Location Orientation: Medial  Staging: Stage 2 -  Partial thickness loss of dermis presenting as a shallow open injury with a red, pink wound bed without slough.  Wound Description (Comments): stage 2 pressure injury surrounded by DTI on coxxyx/sacrum  Present on Admission: Yes                  Estimated body mass index is 31.01 kg/m as calculated from the following:   Height as of this encounter: '5\' 7"'$  (1.702 m).   Weight as of 11/08/22: 89.8 kg.   DVT prophylaxis: SCD Code Status: DNR Family Communication: wife at bedside.  Disposition Plan:  Status is: Inpatient Remains inpatient appropriate because: management of cholangitis.     Consultants:  GI General Sx  Procedures:  ERCP  Antimicrobials:    Subjective: Report nausea,. Poor out put form colostomy. Poor oral intake since 12/25  Objective: Vitals:   11/09/22 1131 11/09/22 1548 11/09/22 2103 11/10/22 0542  BP: (!) 141/77 (!) 141/114 (!) 152/80 (!) 160/91  Pulse: 88 73 84 (!) 117  Resp: '18 18 20 18  '$ Temp: 98.3 F (36.8 C) 97.9 F (36.6 C) 97.8 F (36.6 C) 98.4 F (36.9 C)  TempSrc: Oral  Oral Oral  SpO2:  93% 93% 93% 93%  Height:        Intake/Output Summary (Last 24 hours) at 11/10/2022 0733 Last data filed at 11/10/2022 0545 Gross per 24 hour  Intake 1397.16 ml  Output 1350 ml  Net 47.16 ml   There were no vitals filed for this visit.  Examination:  General exam: Appears calm and comfortable  Respiratory system: Clear to auscultation. Respiratory effort  normal. Cardiovascular system: S1 & S2 heard, RRR. No JVD, murmurs, rubs, gallops or clicks. No pedal edema. Gastrointestinal system: Abdomen is distend, soft, tender, colostomy in place, bag empty. .  Central nervous system: Alert and oriented.  Extremities: Symmetric 5 x 5 power.   Data Reviewed: I have personally reviewed following labs and imaging studies  CBC: Recent Labs  Lab 11/08/22 0447 11/09/22 0423 11/10/22 0209  WBC 19.0* 19.0* 12.5*  NEUTROABS 17.3* 17.4*  --   HGB 17.6* 15.7 15.6  HCT 51.7 45.7 43.7  MCV 101.4* 100.4* 99.5  PLT 168 144* 99*   Basic Metabolic Panel: Recent Labs  Lab 11/08/22 0447 11/09/22 0423 11/10/22 0209  NA 139 138 133*  K 3.8 4.1 4.1  CL 102 108 102  CO2 22 21* 23  GLUCOSE 232* 142* 119*  BUN '15 14 11  '$ CREATININE 1.30* 0.71 1.22  CALCIUM 9.3 8.6* 9.0   GFR: Estimated Creatinine Clearance: 55.9 mL/min (by C-G formula based on SCr of 1.22 mg/dL). Liver Function Tests: Recent Labs  Lab 11/08/22 0447 11/09/22 0423 11/10/22 0209  AST 167* 116* 90*  ALT 55* 69* 76*  ALKPHOS 101 80 86  BILITOT 3.7* 8.0* 8.5*  PROT 8.8* 7.5 7.2  ALBUMIN 4.0 3.1* 2.9*   Recent Labs  Lab 11/08/22 0447 11/09/22 0423  LIPASE 34 30   No results for input(s): "AMMONIA" in the last 168 hours. Coagulation Profile: Recent Labs  Lab 11/09/22 1620  INR 1.5*   Cardiac Enzymes: No results for input(s): "CKTOTAL", "CKMB", "CKMBINDEX", "TROPONINI" in the last 168 hours. BNP (last 3 results) No results for input(s): "PROBNP" in the last 8760 hours. HbA1C: No results for input(s): "HGBA1C" in the last 72 hours. CBG: No results for input(s): "GLUCAP" in the last 168 hours. Lipid Profile: No results for input(s): "CHOL", "HDL", "LDLCALC", "TRIG", "CHOLHDL", "LDLDIRECT" in the last 72 hours. Thyroid Function Tests: No results for input(s): "TSH", "T4TOTAL", "FREET4", "T3FREE", "THYROIDAB" in the last 72 hours. Anemia Panel: No results for input(s):  "VITAMINB12", "FOLATE", "FERRITIN", "TIBC", "IRON", "RETICCTPCT" in the last 72 hours. Sepsis Labs: Recent Labs  Lab 11/08/22 0447 11/08/22 0623 11/08/22 0954 11/08/22 1200 11/08/22 1811  PROCALCITON <0.10  --   --   --   --   LATICACIDVEN 3.5* 5.6* 3.6* 3.6* 1.4    Recent Results (from the past 240 hour(s))  Culture, blood (routine x 2)     Status: None (Preliminary result)   Collection Time: 11/08/22  4:47 AM   Specimen: BLOOD  Result Value Ref Range Status   Specimen Description BLOOD RIGHT ANTECUBITAL  Final   Special Requests   Final    BOTTLES DRAWN AEROBIC AND ANAEROBIC Blood Culture adequate volume   Culture   Final    NO GROWTH 2 DAYS Performed at [facility], [address] [place], [place] [address]    Report Status PENDING  Incomplete  Resp panel by RT-PCR (RSV, Flu A&B, Covid) Anterior Nasal Swab     Status: None   Collection Time: 11/08/22  4:47 AM   Specimen:  Anterior Nasal Swab  Result Value Ref Range Status   SARS Coronavirus 2 by RT PCR NEGATIVE NEGATIVE Final    Comment: (NOTE) SARS-CoV-2 target nucleic acids are NOT DETECTED.  The SARS-CoV-2 RNA is generally detectable in upper respiratory specimens during the acute phase of infection. The lowest concentration of SARS-CoV-2 viral copies this assay can detect is 138 copies/mL. A negative result does not preclude SARS-Cov-2 infection and should not be used as the sole basis for treatment or other patient management decisions. A negative result may occur with  improper specimen collection/handling, submission of specimen other than nasopharyngeal swab, presence of viral mutation(s) within the areas targeted by this assay, and inadequate number of viral copies(<138 copies/mL). A negative result must be combined with clinical observations, patient history, and epidemiological information. The expected result is Negative.  Fact Sheet for Patients:   EntrepreneurPulse.com.au  Fact Sheet for Healthcare Providers:  IncredibleEmployment.be  This test is no t yet approved or cleared by the [place] FDA and  has been authorized for detection and/or diagnosis of SARS-CoV-2 by FDA under an Emergency Use Authorization (EUA). This EUA will remain  in effect (meaning this test can be used) for the duration of the COVID-19 declaration under Section 564(b)(1) of the Act, 21 U.S.C.section 360bbb-3(b)(1), unless the authorization is terminated  or revoked sooner.       Influenza A by PCR NEGATIVE NEGATIVE Final   Influenza B by PCR NEGATIVE NEGATIVE Final    Comment: (NOTE) The Xpert Xpress SARS-CoV-2/FLU/RSV plus assay is intended as an aid in the diagnosis of influenza from Nasopharyngeal swab specimens and should not be used as a sole basis for treatment. Nasal washings and aspirates are unacceptable for Xpert Xpress SARS-CoV-2/FLU/RSV testing.  Fact Sheet for Patients: EntrepreneurPulse.com.au  Fact Sheet for Healthcare Providers: IncredibleEmployment.be  This test is not yet approved or cleared by the [place] FDA and has been authorized for detection and/or diagnosis of SARS-CoV-2 by FDA under an Emergency Use Authorization (EUA). This EUA will remain in effect (meaning this test can be used) for the duration of the COVID-19 declaration under Section 564(b)(1) of the Act, 21 U.S.C. section 360bbb-3(b)(1), unless the authorization is terminated or revoked.     Resp Syncytial Virus by PCR NEGATIVE NEGATIVE Final    Comment: (NOTE) Fact Sheet for Patients: EntrepreneurPulse.com.au  Fact Sheet for Healthcare Providers: IncredibleEmployment.be  This test is not yet approved or cleared by the [place] FDA and has been authorized for detection and/or diagnosis of SARS-CoV-2 by FDA under an Emergency Use  Authorization (EUA). This EUA will remain in effect (meaning this test can be used) for the duration of the COVID-19 declaration under Section 564(b)(1) of the Act, 21 U.S.C. section 360bbb-3(b)(1), unless the authorization is terminated or revoked.  Performed at [facility], [address] [place], [place] [address]   Urine Culture     Status: Abnormal (Preliminary result)   Collection Time: 11/08/22  4:47 AM   Specimen: Urine, Catheterized  Result Value Ref Range Status   Specimen Description   Final    URINE, CATHETERIZED Performed at [facility], [address], [place], [place] [address]    Special Requests   Final    NONE Performed at [facility], [address] [place], [place] [address]    Culture (A)  Final    >=100,000 COLONIES/mL PROTEUS MIRABILIS >=100,000 COLONIES/mL ESCHERICHIA COLI    Report Status PENDING  Incomplete  Radiology Studies: ECHOCARDIOGRAM COMPLETE  Result Date: 11/09/2022    ECHOCARDIOGRAM REPORT   Patient Name:   Stephen [name] Date of Exam: 11/09/2022 Medical Rec #:  [id number]       Height:       67.0 in Accession #:    [id number]      Weight:       198.0 lb Date of Birth:  07-Jan-1946       BSA:          2.014 m Patient Age:    89 years        BP:           141/77 mmHg Patient Gender: M               HR:           93 bpm. Exam Location:  Inpatient Procedure: 2D Echo, Color Doppler, Cardiac Doppler and Intracardiac            Opacification Agent STAT ECHO Indications:    Preoperative evaluation  History:        Patient has no prior history of Echocardiogram examinations.                 Risk Factors:Former Smoker, Hypertension and ETOH. Hx of cancer                 s/p chemotherapy.  Sonographer:    Eartha Inch Referring Phys: Margie Billet, A  Sonographer Comments: Technically difficult study due to poor echo windows. Image acquisition challenging due to patient body habitus and Image acquisition  challenging due to respiratory motion. IMPRESSIONS  1. Left ventricular ejection fraction, by estimation, is 60 to 65%. The left ventricle has normal function. The left ventricle has no regional wall motion abnormalities. Left ventricular diastolic parameters are consistent with Grade I diastolic dysfunction (impaired relaxation).  2. Right ventricular systolic function is normal. The right ventricular size is normal. Tricuspid regurgitation signal is inadequate for assessing PA pressure.  3. The mitral valve is grossly normal. Trivial mitral valve regurgitation.  4. The aortic valve is tricuspid. Aortic valve regurgitation is not visualized. Aortic valve sclerosis is present, with no evidence of aortic valve stenosis.  5. Aortic dilatation noted. There is borderline dilatation of the aortic root, measuring 38 mm.  6. The inferior vena cava is normal in size with greater than 50% respiratory variability, suggesting right atrial pressure of 3 mmHg. Comparison(s): No prior Echocardiogram. FINDINGS  Left Ventricle: Left ventricular ejection fraction, by estimation, is 60 to 65%. The left ventricle has normal function. The left ventricle has no regional wall motion abnormalities. Definity contrast agent was given IV to delineate the left ventricular  endocardial borders. The left ventricular internal cavity size was normal in size. There is no left ventricular hypertrophy. Left ventricular diastolic parameters are consistent with Grade I diastolic dysfunction (impaired relaxation). Right Ventricle: The right ventricular size is normal. No increase in right ventricular wall thickness. Right ventricular systolic function is normal. Tricuspid regurgitation signal is inadequate for assessing PA pressure. Left Atrium: Left atrial size was normal in size. Right Atrium: Right atrial size was normal in size. Pericardium: There is no evidence of pericardial effusion. Mitral Valve: The mitral valve is grossly normal. Trivial  mitral valve regurgitation. MV peak gradient, 3.2 mmHg. The mean mitral valve gradient is 1.0 mmHg. Tricuspid Valve: The tricuspid valve is normal in structure. Tricuspid valve regurgitation is not demonstrated. Aortic Valve: The aortic valve is tricuspid. Aortic  valve regurgitation is not visualized. Aortic valve sclerosis is present, with no evidence of aortic valve stenosis. Pulmonic Valve: The pulmonic valve was normal in structure. Pulmonic valve regurgitation is trivial. Aorta: Aortic dilatation noted. There is borderline dilatation of the aortic root, measuring 38 mm. Venous: The inferior vena cava is normal in size with greater than 50% respiratory variability, suggesting right atrial pressure of 3 mmHg. IAS/Shunts: The atrial septum is grossly normal.  LEFT VENTRICLE PLAX 2D LVIDd:         3.90 cm   Diastology LVIDs:         3.00 cm   LV e' medial:    5.98 cm/s LV PW:         0.90 cm   LV E/e' medial:  9.3 LV IVS:        0.80 cm   LV e' lateral:   10.20 cm/s LVOT diam:     2.10 cm   LV E/e' lateral: 5.4 LV SV:         58 LV SV Index:   29 LVOT Area:     3.46 cm  RIGHT VENTRICLE             IVC RV S prime:     14.50 cm/s  IVC diam: 0.70 cm TAPSE (M-mode): 1.7 cm LEFT ATRIUM             Index        RIGHT ATRIUM           Index LA diam:        3.10 cm 1.54 cm/m   RA Area:     15.90 cm LA Vol (A2C):   38.0 ml 18.87 ml/m  RA Volume:   44.40 ml  22.05 ml/m LA Vol (A4C):   41.4 ml 20.56 ml/m LA Biplane Vol: 41.7 ml 20.71 ml/m  AORTIC VALVE LVOT Vmax:   89.40 cm/s LVOT Vmean:  64.800 cm/s LVOT VTI:    0.168 m  AORTA Ao Root diam: 3.80 cm MITRAL VALVE MV Area (PHT): 2.45 cm    SHUNTS MV Area VTI:   2.69 cm    Systemic VTI:  0.17 m MV Peak grad:  3.2 mmHg    Systemic Diam: 2.10 cm MV Mean grad:  1.0 mmHg MV Vmax:       0.89 m/s MV Vmean:      56.0 cm/s MV Decel Time: 310 msec MV E velocity: 55.50 cm/s MV A velocity: 78.60 cm/s MV E/A ratio:  0.71 Gwyndolyn Kaufman MD Electronically signed by Gwyndolyn Kaufman MD Signature Date/Time: 11/09/2022/2:19:04 PM    Final         Scheduled Meds:  Chlorhexidine Gluconate Cloth  6 each Topical Daily   Continuous Infusions:  lactated ringers 75 mL/hr at 11/10/22 0303   piperacillin-tazobactam (ZOSYN)  IV 3.375 g (11/10/22 0546)     LOS: 1 day    Time spent: 35 minutes.     Elmarie Shiley, MD [facility]   If 7PM-7AM, please contact night-coverage [email or web address]  11/10/2022, 7:33 AM

## 2022-11-10 NOTE — Op Note (Signed)
[facility] Patient Name: Stephen [name] Procedure Date : 11/10/2022 MRN: [id number] Attending MD: Docia Chuck. Henrene Pastor , MD, [id number] Date of Birth: 1946-03-07 CSN: [id number] Age: 76 Admit Type: Inpatient Procedure:                ERCP?"aborted. Followed by EGD Indications:              Bile duct stone on Computed Tomogram Scan Providers:                Docia Chuck. Henrene Pastor, MD, Grace Isaac, RN, Helane Rima, RN, Frazier Richards, Technician Referring MD:             Verdia Kuba. Paduchowski Medicines:                General Anesthesia Complications:            Esophageal tear Estimated Blood Loss:     None. Procedure:                Pre-Anesthesia Assessment:                           - Prior to the procedure, a History and Physical                            was performed, and patient medications and                            allergies were reviewed. The patient is competent.                            The risks and benefits of the procedure and the                            sedation options and risks were discussed with the                            patient. All questions were answered and informed                            consent was obtained. Patient identification and                            proposed procedure were verified by the physician.                            Mental Status Examination: alert and oriented.                            Airway Examination: normal oropharyngeal airway and                            neck mobility. Respiratory Examination: clear to  auscultation. CV Examination: normal. Prophylactic                            Antibiotics: The patient was already on                            antibiotics. Prior Anticoagulants: The patient has                            taken no anticoagulant or antiplatelet agents. ASA                            Grade Assessment: III - A patient with severe                             systemic disease. After reviewing the risks and                            benefits, the patient was deemed in satisfactory                            condition to undergo the procedure. The anesthesia                            plan was to use moderate sedation / analgesia                            (conscious sedation). Immediately prior to                            administration of medications, the patient was                            re-assessed for adequacy to receive sedatives. The                            heart rate, respiratory rate, oxygen saturations,                            blood pressure, adequacy of pulmonary ventilation,                            and response to care were monitored throughout the                            procedure. The physical status of the patient was                            re-assessed after the procedure.                           After obtaining informed consent, the scope was  passed under direct vision. Throughout the                            procedure, the patient's blood pressure, pulse, and                            oxygen saturations were monitored continuously. The                            [id number]-Q190V ([id number]) Olympus duodenoscope was                            introduced through the mouth, The procedure was                            aborted. The ERCP was aborted due to cap induced                            esophageal laceration. Scope In: Scope Out: Findings:      The side-viewing endoscope was passed blindly into the esophagus without       resistance at the level of the UES. Shortly thereafter significant       resistance was encountered. The side-viewing scope was retracted. There       was significant bleeding. The side-viewing scope was thus removed. This       was exchanged for a standard and viewing upper endoscope. Examination       revealed a deep laceration in the  proximal esophagus. No obvious       perforation identified. Below this area esophagus was normal except for       a high-grade distal esophageal stricture at the gastroesophageal       junction measuring approximately 9 to 10 mm. The endoscope was passed       beyond with minimal resistance. Stomach was normal, save hiatal hernia.       The duodenum was normal. Retroflexed views revealed no additional       abnormalities. The endoscope was removed Impression:               1. The ERCP was aborted due to cap induced                            esophageal laceration.                           NOTE: Since moving away from traditional                            duodenoscopes and implementing a "disposable cap"                            (meant to reduce the risk of duodenoscope                            associated ERCP infections), there have been a  number of reports (and our group has personally                            seen cases as well) of Cap Induced lacerations of                            the upper GI mucosa including the esophagus and                            stomach. This is another such example.                           2. High-grade peptic stricture and hiatal hernia.                            Otherwise unremarkable EGD Recommendation:           1. Stat Gastrografin swallow to rule out proximal                            esophageal perforation                           2. Further plans to be determined thereafter.                           Attempted to call the patient's spouse, Sydell Axon, at                            the home number. No answer. Voicemail not set up.                           3. Inpatient GI team will continue to follow Procedure Code(s):        --- Professional ---                           [phone], 52, Endoscopic retrograde                            cholangiopancreatography (ERCP); diagnostic,                            including  collection of specimen(s) by brushing or                            washing, when performed (separate procedure) Diagnosis Code(s):        --- Professional ---                           K80.50, Calculus of bile duct without cholangitis                            or cholecystitis without obstruction CPT copyright 2022 American [employer] Association. All rights reserved. The codes documented in this report are preliminary and upon coder review may  be revised  to meet current compliance requirements. Docia Chuck. Henrene Pastor, MD 11/10/2022 2:35:10 PM This report has been signed electronically. Number of Addenda: 0

## 2022-11-11 DIAGNOSIS — K810 Acute cholecystitis: Secondary | ICD-10-CM | POA: Diagnosis not present

## 2022-11-11 LAB — COMPREHENSIVE METABOLIC PANEL
ALT: 61 U/L — ABNORMAL HIGH (ref 0–44)
AST: 74 U/L — ABNORMAL HIGH (ref 15–41)
Albumin: 2.5 g/dL — ABNORMAL LOW (ref 3.5–5.0)
Alkaline Phosphatase: 90 U/L (ref 38–126)
Anion gap: 9 (ref 5–15)
BUN: 21 mg/dL (ref 8–23)
CO2: 21 mmol/L — ABNORMAL LOW (ref 22–32)
Calcium: 8.6 mg/dL — ABNORMAL LOW (ref 8.9–10.3)
Chloride: 105 mmol/L (ref 98–111)
Creatinine, Ser: 1.43 mg/dL — ABNORMAL HIGH (ref 0.61–1.24)
GFR, Estimated: 51 mL/min — ABNORMAL LOW (ref >60)
Glucose, Bld: 121 mg/dL — ABNORMAL HIGH (ref 70–99)
Potassium: 3.8 mmol/L (ref 3.5–5.1)
Sodium: 135 mmol/L (ref 135–145)
Total Bilirubin: 6.4 mg/dL — ABNORMAL HIGH (ref 0.3–1.2)
Total Protein: 6.8 g/dL (ref 6.5–8.1)

## 2022-11-11 LAB — CBC
HCT: 40.1 % (ref 39.0–52.0)
Hemoglobin: 14.2 g/dL (ref 13.0–17.0)
MCH: 35.6 pg — ABNORMAL HIGH (ref 26.0–34.0)
MCHC: 35.4 g/dL (ref 30.0–36.0)
MCV: 100.5 fL — ABNORMAL HIGH (ref 80.0–100.0)
Platelets: 101 10*3/uL — ABNORMAL LOW (ref 150–400)
RBC: 3.99 MIL/uL — ABNORMAL LOW (ref 4.22–5.81)
RDW: 15.1 % (ref 11.5–15.5)
WBC: 13.2 10*3/uL — ABNORMAL HIGH (ref 4.0–10.5)
nRBC: 0 % (ref 0.0–0.2)

## 2022-11-11 NOTE — Progress Notes (Addendum)
1 Day Post-Op   Subjective/Chief Complaint: Events of yesterday reviewed.  Upper GI has not been done yet.  Patient in no distress.  Still jaundiced.   Objective: Vital signs in last 24 hours: Temp:  [97.4 F (36.3 C)-99 F (37.2 C)] 98.1 F (36.7 C) (12/29 0833) Pulse Rate:  [68-108] 73 (12/29 0833) Resp:  [14-24] 16 (12/29 0833) BP: (108-148)/(61-80) 108/71 (12/29 0833) SpO2:  [90 %-95 %] 92 % (12/29 0833) Last BM Date : 11/10/22  Intake/Output from previous day: 12/28 0701 - 12/29 0700 In: 1804 [I.V.:1660.4; IV Piggyback:143.6] Out: 0  Intake/Output this shift: No intake/output data recorded.  HEENT: Jaundice noted with scleral icterus  Abdomen: Soft nontender no signs of peritonitis. LLQ ostomy viable   Lab Results:  Recent Labs    11/10/22 0209 11/11/22 0718  WBC 12.5* 13.2*  HGB 15.6 14.2  HCT 43.7 40.1  PLT 99* 101*   BMET Recent Labs    11/10/22 0209 11/11/22 0718  NA 133* 135  K 4.1 3.8  CL 102 105  CO2 23 21*  GLUCOSE 119* 121*  BUN 11 21  CREATININE 1.22 1.43*  CALCIUM 9.0 8.6*   PT/INR Recent Labs    11/09/22 1620  LABPROT 18.0*  INR 1.5*   ABG No results for input(s): "PHART", "HCO3" in the last 72 hours.  Invalid input(s): "PCO2", "PO2"  Studies/Results: CT CHEST WO CONTRAST  Result Date: 11/10/2022 CLINICAL DATA:  Esophageal perforation EXAM: CT CHEST WITHOUT CONTRAST TECHNIQUE: Multidetector CT imaging of the chest was performed following the standard protocol without IV contrast. 50 cc of oral Omnipaque was administered prior to evaluation to assess for esophageal perforation. RADIATION DOSE REDUCTION: This exam was performed according to the departmental dose-optimization program which includes automated exposure control, adjustment of the mA and/or kV according to patient size and/or use of iterative reconstruction technique. COMPARISON:  11/08/2022, 11/10/2022, 03/05/2021 FINDINGS: Cardiovascular: Enhanced imaging of the heart  and great vessels demonstrates no pericardial effusion. Normal caliber of the thoracic aorta. Atherosclerosis of the aorta and coronary vasculature. Mediastinum/Nodes: Streak artifact from the oral contrast does somewhat limit the evaluation. There appears to be a 9 mm defect within the left posterolateral aspect of the distal esophagus just proximal to the GE junction,, reference image 161/3. Contrast does not extend into the in paraesophageal soft tissues or into the upper abdomen, and this likely reflects a focal contained rent. There is also a thin linear area of contrast along the left anterolateral aspect of the upper thoracic esophagus, reference image 63-80 of series 3, which could reflect a small upper esophageal tear as well. There is no extension of contrast into the paraesophageal soft tissues or posterior mediastinum. The thyroid and trachea are unremarkable.  No pathologic adenopathy. Lungs/Pleura: There is a spiculated part solid right apical pulmonary nodule measuring 14 x 11 x 12 mm, reference image 71/4. The solid component of this mass measures approximately 10 mm. A similar area was seen on prior chest CT 03/05/2021, obscured by surrounding multifocal pneumonia. This is highly concerning for bronchogenic neoplasm. Dependent consolidation within the right lower lobe consistent with atelectasis or scarring. Prior partial left lower lobe resection. No airspace disease, effusion, or pneumothorax. Upper lobe predominant emphysema. The central airways are patent. Upper Abdomen: Calcified gallstones are again identified. No evidence of acute cholecystitis. The downstream common bile duct demonstrating choledocholithiasis is not included on this exam due to slice selection. Musculoskeletal: No acute or destructive bony lesions. Severe C6-7 spondylosis. Reconstructed images  demonstrate no additional findings. IMPRESSION: 1. Focal rent within the left posterolateral aspect of the distal thoracic esophagus  just proximal to the gastroesophageal junction. Contrast is seen extending into the defect, but does not involve the para esophageal soft tissues or extend into the upper abdomen. There is no surrounding phlegmon. This is consistent with a contained tear. 2. Thin linear tracking of contrast along the left anterolateral aspect of the proximal thoracic esophagus, which may reflect a contained tear in this region as well. No extension of contrast into the paraesophageal soft tissues or posterior mediastinum. No surrounding inflammatory changes. 3. 13 mm suspicious right part-solid pulmonary nodule within the upper lobe. Per Fleischner Society Guidelines, given the increase in size of the solid component of this part solid nodule, recommend PET/CT, biopsy or resection. These guidelines do not apply to immunocompromised patients and patients with cancer. Follow up in patients with significant comorbidities as clinically warranted. For lung cancer screening, adhere to Lung-RADS guidelines. Reference: Radiology. 2017; 284(1):228-43. 4. Aortic Atherosclerosis (ICD10-I70.0) and Emphysema (ICD10-J43.9). 5. Cholelithiasis. Critical Value/emergent results were called by telephone at the time of interpretation on 11/10/2022 at 5:15 pm to provider [facility], who verbally acknowledged these results. Electronically Signed   By: Randa Ngo M.D.   On: 11/10/2022 17:20   DG Abd Portable 2V  Result Date: 11/10/2022 CLINICAL DATA:  Constipation and nausea in a 76 year old male. EXAM: PORTABLE ABDOMEN - 2 VIEW COMPARISON:  CT imaging from November 08, 2022. FINDINGS: Study is limited by patient body habitus. EKG leads project over the chest and abdomen. Global dilation of small bowel loops. These are moderately dilated up to 3 cm in some areas. Scattered gas-filled loops of large bowel. No free air beneath either the RIGHT or LEFT hemidiaphragm. Mild moderate gastric distension with small hiatal hernia. 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 On limited assessment no acute regional skeletal findings. IMPRESSION: 1. Global dilation of small bowel loops. Findings could reflect developing small bowel obstruction or ileus. Attention on follow-up. 2. No free air. 3. Signs of hiatal hernia. Electronically Signed   By: Zetta Bills M.D.   On: 11/10/2022 10:52   ECHOCARDIOGRAM COMPLETE  Result Date: 11/09/2022    ECHOCARDIOGRAM REPORT   Patient Name:   Stephen [name] Date of Exam: 11/09/2022 Medical Rec #:  [id number]       Height:       67.0 in Accession #:    [id number]      Weight:       198.0 lb Date of Birth:  [birth year]       BSA:          2.014 m Patient Age:    76 years        BP:           141/77 mmHg Patient Gender: M               HR:           93 bpm. Exam Location:  Inpatient Procedure: 2D Echo, Color Doppler, Cardiac Doppler and Intracardiac            Opacification Agent STAT ECHO Indications:    Preoperative evaluation  History:        Patient has no prior history of Echocardiogram examinations.                 Risk Factors:Former Smoker, Hypertension and ETOH. Hx of cancer  s/p chemotherapy.  Sonographer:    Eartha Inch Referring Phys: Margie Billet, A  Sonographer Comments: Technically difficult study due to poor echo windows. Image acquisition challenging due to patient body habitus and Image acquisition challenging due to respiratory motion. IMPRESSIONS  1. Left ventricular ejection fraction, by estimation, is 60 to 65%. The left ventricle has normal function. The left ventricle has no regional wall motion abnormalities. Left ventricular diastolic parameters are consistent with Grade I diastolic dysfunction (impaired relaxation).  2. Right ventricular systolic function is normal. The right ventricular size is normal. Tricuspid regurgitation signal is inadequate for assessing PA pressure.  3. The mitral valve is grossly normal. Trivial mitral valve regurgitation.  4. The aortic valve is  tricuspid. Aortic valve regurgitation is not visualized. Aortic valve sclerosis is present, with no evidence of aortic valve stenosis.  5. Aortic dilatation noted. There is borderline dilatation of the aortic root, measuring 38 mm.  6. The inferior vena cava is normal in size with greater than 50% respiratory variability, suggesting right atrial pressure of 3 mmHg. Comparison(s): No prior Echocardiogram. FINDINGS  Left Ventricle: Left ventricular ejection fraction, by estimation, is 60 to 65%. The left ventricle has normal function. The left ventricle has no regional wall motion abnormalities. Definity contrast agent was given IV to delineate the left ventricular  endocardial borders. The left ventricular internal cavity size was normal in size. There is no left ventricular hypertrophy. Left ventricular diastolic parameters are consistent with Grade I diastolic dysfunction (impaired relaxation). Right Ventricle: The right ventricular size is normal. No increase in right ventricular wall thickness. Right ventricular systolic function is normal. Tricuspid regurgitation signal is inadequate for assessing PA pressure. Left Atrium: Left atrial size was normal in size. Right Atrium: Right atrial size was normal in size. Pericardium: There is no evidence of pericardial effusion. Mitral Valve: The mitral valve is grossly normal. Trivial mitral valve regurgitation. MV peak gradient, 3.2 mmHg. The mean mitral valve gradient is 1.0 mmHg. Tricuspid Valve: The tricuspid valve is normal in structure. Tricuspid valve regurgitation is not demonstrated. Aortic Valve: The aortic valve is tricuspid. Aortic valve regurgitation is not visualized. Aortic valve sclerosis is present, with no evidence of aortic valve stenosis. Pulmonic Valve: The pulmonic valve was normal in structure. Pulmonic valve regurgitation is trivial. Aorta: Aortic dilatation noted. There is borderline dilatation of the aortic root, measuring 38 mm. Venous: The  inferior vena cava is normal in size with greater than 50% respiratory variability, suggesting right atrial pressure of 3 mmHg. IAS/Shunts: The atrial septum is grossly normal.  LEFT VENTRICLE PLAX 2D LVIDd:         3.90 cm   Diastology LVIDs:         3.00 cm   LV e' medial:    5.98 cm/s LV PW:         0.90 cm   LV E/e' medial:  9.3 LV IVS:        0.80 cm   LV e' lateral:   10.20 cm/s LVOT diam:     2.10 cm   LV E/e' lateral: 5.4 LV SV:         58 LV SV Index:   29 LVOT Area:     3.46 cm  RIGHT VENTRICLE             IVC RV S prime:     14.50 cm/s  IVC diam: 0.70 cm TAPSE (M-mode): 1.7 cm LEFT ATRIUM  Index        RIGHT ATRIUM           Index LA diam:        3.10 cm 1.54 cm/m   RA Area:     15.90 cm LA Vol (A2C):   38.0 ml 18.87 ml/m  RA Volume:   44.40 ml  22.05 ml/m LA Vol (A4C):   41.4 ml 20.56 ml/m LA Biplane Vol: 41.7 ml 20.71 ml/m  AORTIC VALVE LVOT Vmax:   89.40 cm/s LVOT Vmean:  64.800 cm/s LVOT VTI:    0.168 m  AORTA Ao Root diam: 3.80 cm MITRAL VALVE MV Area (PHT): 2.45 cm    SHUNTS MV Area VTI:   2.69 cm    Systemic VTI:  0.17 m MV Peak grad:  3.2 mmHg    Systemic Diam: 2.10 cm MV Mean grad:  1.0 mmHg MV Vmax:       0.89 m/s MV Vmean:      56.0 cm/s MV Decel Time: 310 msec MV E velocity: 55.50 cm/s MV A velocity: 78.60 cm/s MV E/A ratio:  0.71 Gwyndolyn Kaufman MD Electronically signed by Gwyndolyn Kaufman MD Signature Date/Time: 11/09/2022/2:19:04 PM    Final     Anti-infectives: Anti-infectives (From admission, onward)    Start     Dose/Rate Route Frequency Ordered Stop   11/09/22 1200  piperacillin-tazobactam (ZOSYN) IVPB 3.375 g        3.375 g 12.5 mL/hr over 240 Minutes Intravenous Every 8 hours 11/09/22 0951 11/16/22 1759       Assessment/Plan: Cholelithiasis with probable choledocholithiasis without signs of cholecystitis  We reviewed the events from yesterday.  Await upper GI before allowing diet.  This can be followed up by GI.  From a surgical standpoint, will  discuss with on-call team for the weekend for timing of cholecystectomy since he wishes to proceed.  He does have significant comorbidities, therefore increasing postop complications and/or risk of death.  Also, if he does have a retained common bile duct stone, plans need to be made about management of this given failed ERCP attempt yesterday.  Okay to allow clears once upper GI completed and no evidence of esophageal injury or leak noted.  Timing of surgery still to be determined.  Bilirubin is down so hopefully he has passed the stone.  No signs of sepsis at this point or progression of cholangitis.   LOS: 2 days    Turner Daniels MD  11/11/2022    Total time 30 minutes

## 2022-11-11 NOTE — Care Management Important Message (Signed)
Important Message  Patient Details  Name: Stephen [name] MRN: [id number] Date of Birth: 04/16/1946   Medicare Important Message Given:  Yes     Hannah Beat 11/11/2022, 11:05 AM

## 2022-11-11 NOTE — Progress Notes (Signed)
Stephen [name]  CC: Choledocholithiasis  Subjective: He remains NPO.  He wishes to try a clear liquid diet before attempting soft foods.  No nausea or vomiting.  His RUQ pain is slightly worse today when compared to yesterday.  Colostomy output without blood or melanotic stool.  He intermittently feels slightly short of breath, on oxygen 2 L nasal cannula.  No family at the bedside at this time.  Objective:   ERCP 11/10/2022: 1. The ERCP was aborted due to cap induced esophageal laceration. [name]: Since moving away from traditional duodenoscopes and implementing a "disposable cap" (meant to reduce the risk of duodenoscope associated ERCP infections), there have been a number of reports (and our group has personally seen cases as well) of Cap Induced lacerations of the upper GI mucosa including the esophagus and stomach. This is another such example. 2. High-grade peptic stricture and hiatal hernia. Otherwise unremarkable EGD  Vital signs in last 24 hours: Temp:  [97.4 F (36.3 C)-98.1 F (36.7 C)] 98.1 F (36.7 C) (12/29 0833) Pulse Rate:  [68-92] 73 (12/29 0833) Resp:  [14-24] 16 (12/29 0833) BP: (108-148)/(61-74) 108/71 (12/29 0833) SpO2:  [90 %-95 %] 92 % (12/29 0833) Last BM Date : 11/10/22 General: Alert 76 year old male fatigued appearing in no acute distress. Eyes: Scleral icterus present. Heart: Regular rate and rhythm, no murmurs. Pulm: Breath sounds clear throughout.  On oxygen 2 L nasal cannula. Abdomen: Soft with RUQ tenderness without rebound or guarding.  Positive bowel sounds all 4 quadrants.  Lower abdominal wall with anasarca.  No palpable mass. Extremities:  + Anasarca. Skin: + Jaundice. Neurologic:  Alert and  oriented x 4. Grossly normal neurologically. Psych:  Alert and cooperative. Normal mood and affect.  Intake/Output from previous day: 12/28 0701 - 12/29 0700 In: 1804 [I.V.:1660.4; IV Piggyback:143.6] Out: 0   Intake/Output this shift: No intake/output data recorded.  Lab Results: Recent Labs    11/09/22 0423 11/10/22 0209 11/11/22 0718  WBC 19.0* 12.5* 13.2*  HGB 15.7 15.6 14.2  HCT 45.7 43.7 40.1  PLT 144* 99* 101*   BMET Recent Labs    11/09/22 0423 11/10/22 0209 11/11/22 0718  NA 138 133* 135  K 4.1 4.1 3.8  CL 108 102 105  CO2 21* 23 21*  GLUCOSE 142* 119* 121*  BUN _0 CREATININE 0.71 1.22 1.43*  CALCIUM 8.6* 9.0 8.6*   LFT Recent Labs    11/11/22 0718  PROT 6.8  ALBUMIN 2.5*  AST 74*  ALT 61*  ALKPHOS 90  BILITOT 6.4*   PT/INR Recent Labs    11/09/22 1620  LABPROT 18.0*  INR 1.5*   Hepatitis Panel No results for input(s): "HEPBSAG", "HCVAB", "HEPAIGM", "HEPBIGM" in the last 72 hours.  CT CHEST WO CONTRAST  Result Date: 11/10/2022 CLINICAL DATA:  Esophageal perforation EXAM: CT CHEST WITHOUT CONTRAST TECHNIQUE: Multidetector CT imaging of the chest was performed following the standard protocol without IV contrast. 50 cc of oral Omnipaque was administered prior to evaluation to assess for esophageal perforation. RADIATION DOSE REDUCTION: This exam was performed according to the departmental dose-optimization program which includes automated exposure control, adjustment of the mA and/or kV according to patient size and/or use of iterative reconstruction technique. COMPARISON:  11/08/2022, 11/10/2022, 03/05/2021 FINDINGS: Cardiovascular: Enhanced imaging of the heart and great vessels demonstrates no pericardial effusion. Normal caliber of the thoracic aorta. Atherosclerosis of the aorta and coronary vasculature. Mediastinum/Nodes: Streak artifact from the oral contrast does somewhat  limit the evaluation. There appears to be a 9 mm defect within the left posterolateral aspect of the distal esophagus just proximal to the GE junction,, reference image 161/3. Contrast does not extend into the in paraesophageal soft tissues or into the upper abdomen, and this  likely reflects a focal contained rent. There is also a thin linear area of contrast along the left anterolateral aspect of the upper thoracic esophagus, reference image 63-80 of series 3, which could reflect a small upper esophageal tear as well. There is no extension of contrast into the paraesophageal soft tissues or posterior mediastinum. The thyroid and trachea are unremarkable.  No pathologic adenopathy. Lungs/Pleura: There is a spiculated part solid right apical pulmonary nodule measuring 14 x 11 x 12 mm, reference image 71/4. The solid component of this mass measures approximately 10 mm. A similar area was seen on prior chest CT 03/05/2021, obscured by surrounding multifocal pneumonia. This is highly concerning for bronchogenic neoplasm. Dependent consolidation within the right lower lobe consistent with atelectasis or scarring. Prior partial left lower lobe resection. No airspace disease, effusion, or pneumothorax. Upper lobe predominant emphysema. The central airways are patent. Upper Abdomen: Calcified gallstones are again identified. No evidence of acute cholecystitis. The downstream common bile duct demonstrating choledocholithiasis is not included on this exam due to slice selection. Musculoskeletal: No acute or destructive bony lesions. Severe C6-7 spondylosis. Reconstructed images demonstrate no additional findings. IMPRESSION: 1. Focal rent within the left posterolateral aspect of the distal thoracic esophagus just proximal to the gastroesophageal junction. Contrast is seen extending into the defect, but does not involve the para esophageal soft tissues or extend into the upper abdomen. There is no surrounding phlegmon. This is consistent with a contained tear. 2. Thin linear tracking of contrast along the left anterolateral aspect of the proximal thoracic esophagus, which may reflect a contained tear in this region as well. No extension of contrast into the paraesophageal soft tissues or posterior  mediastinum. No surrounding inflammatory changes. 3. 13 mm suspicious right part-solid pulmonary nodule within the upper lobe. Per Fleischner Society Guidelines, given the increase in size of the solid component of this part solid nodule, recommend PET/CT, biopsy or resection. These guidelines do not apply to immunocompromised patients and patients with cancer. Follow up in patients with significant comorbidities as clinically warranted. For lung cancer screening, adhere to Lung-RADS guidelines. Reference: Radiology. 2017; 284(1):228-43. 4. Aortic Atherosclerosis (ICD10-I70.0) and Emphysema (ICD10-J43.9). 5. Cholelithiasis. Critical Value/emergent results were called by telephone at the time of interpretation on 11/10/2022 at 5:15 pm to provider [facility], who verbally acknowledged these results. Electronically Signed   By: Randa Ngo M.D.   On: 11/10/2022 17:20   DG Abd Portable 2V  Result Date: 11/10/2022 CLINICAL DATA:  Constipation and nausea in a 76 year old male. EXAM: PORTABLE ABDOMEN - 2 VIEW COMPARISON:  CT imaging from November 08, 2022. FINDINGS: Study is limited by patient body habitus. EKG leads project over the chest and abdomen. Global dilation of small bowel loops. These are moderately dilated up to 3 cm in some areas. Scattered gas-filled loops of large bowel. No free air beneath either the RIGHT or LEFT hemidiaphragm. Mild moderate gastric distension with small hiatal hernia. LEFT lower quadrant colostomy noted on prior CT. On limited assessment no acute regional skeletal findings. IMPRESSION: 1. Global dilation of small bowel loops. Findings could reflect developing small bowel obstruction or ileus. Attention on follow-up. 2. No free air. 3. Signs of hiatal hernia. Electronically Signed   By:  Zetta Bills M.D.   On: 11/10/2022 10:52   ECHOCARDIOGRAM COMPLETE  Result Date: 11/09/2022    ECHOCARDIOGRAM REPORT   Patient Name:   Stephen [name] Date of Exam: 11/09/2022 Medical Rec #:   [id number]       Height:       67.0 in Accession #:    [id number]      Weight:       198.0 lb Date of Birth:  03/05/1946       BSA:          2.014 m Patient Age:    55 years        BP:           141/77 mmHg Patient Gender: M               HR:           93 bpm. Exam Location:  Inpatient Procedure: 2D Echo, Color Doppler, Cardiac Doppler and Intracardiac            Opacification Agent STAT ECHO Indications:    Preoperative evaluation  History:        Patient has no prior history of Echocardiogram examinations.                 Risk Factors:Former Smoker, Hypertension and ETOH. Hx of cancer                 s/p chemotherapy.  Sonographer:    Eartha Inch Referring Phys: Margie Billet, A  Sonographer Comments: Technically difficult study due to poor echo windows. Image acquisition challenging due to patient body habitus and Image acquisition challenging due to respiratory motion. IMPRESSIONS  1. Left ventricular ejection fraction, by estimation, is 60 to 65%. The left ventricle has normal function. The left ventricle has no regional wall motion abnormalities. Left ventricular diastolic parameters are consistent with Grade I diastolic dysfunction (impaired relaxation).  2. Right ventricular systolic function is normal. The right ventricular size is normal. Tricuspid regurgitation signal is inadequate for assessing PA pressure.  3. The mitral valve is grossly normal. Trivial mitral valve regurgitation.  4. The aortic valve is tricuspid. Aortic valve regurgitation is not visualized. Aortic valve sclerosis is present, with no evidence of aortic valve stenosis.  5. Aortic dilatation noted. There is borderline dilatation of the aortic root, measuring 38 mm.  6. The inferior vena cava is normal in size with greater than 50% respiratory variability, suggesting right atrial pressure of 3 mmHg. Comparison(s): No prior Echocardiogram. FINDINGS  Left Ventricle: Left ventricular ejection fraction, by estimation, is 60 to 65%. The  left ventricle has normal function. The left ventricle has no regional wall motion abnormalities. Definity contrast agent was given IV to delineate the left ventricular  endocardial borders. The left ventricular internal cavity size was normal in size. There is no left ventricular hypertrophy. Left ventricular diastolic parameters are consistent with Grade I diastolic dysfunction (impaired relaxation). Right Ventricle: The right ventricular size is normal. No increase in right ventricular wall thickness. Right ventricular systolic function is normal. Tricuspid regurgitation signal is inadequate for assessing PA pressure. Left Atrium: Left atrial size was normal in size. Right Atrium: Right atrial size was normal in size. Pericardium: There is no evidence of pericardial effusion. Mitral Valve: The mitral valve is grossly normal. Trivial mitral valve regurgitation. MV peak gradient, 3.2 mmHg. The mean mitral valve gradient is 1.0 mmHg. Tricuspid Valve: The tricuspid valve is normal in structure. Tricuspid valve regurgitation is not  demonstrated. Aortic Valve: The aortic valve is tricuspid. Aortic valve regurgitation is not visualized. Aortic valve sclerosis is present, with no evidence of aortic valve stenosis. Pulmonic Valve: The pulmonic valve was normal in structure. Pulmonic valve regurgitation is trivial. Aorta: Aortic dilatation noted. There is borderline dilatation of the aortic root, measuring 38 mm. Venous: The inferior vena cava is normal in size with greater than 50% respiratory variability, suggesting right atrial pressure of 3 mmHg. IAS/Shunts: The atrial septum is grossly normal.  LEFT VENTRICLE PLAX 2D LVIDd:         3.90 cm   Diastology LVIDs:         3.00 cm   LV e' medial:    5.98 cm/s LV PW:         0.90 cm   LV E/e' medial:  9.3 LV IVS:        0.80 cm   LV e' lateral:   10.20 cm/s LVOT diam:     2.10 cm   LV E/e' lateral: 5.4 LV SV:         58 LV SV Index:   29 LVOT Area:     3.46 cm  RIGHT  VENTRICLE             IVC RV S prime:     14.50 cm/s  IVC diam: 0.70 cm TAPSE (M-mode): 1.7 cm LEFT ATRIUM             Index        RIGHT ATRIUM           Index LA diam:        3.10 cm 1.54 cm/m   RA Area:     15.90 cm LA Vol (A2C):   38.0 ml 18.87 ml/m  RA Volume:   44.40 ml  22.05 ml/m LA Vol (A4C):   41.4 ml 20.56 ml/m LA Biplane Vol: 41.7 ml 20.71 ml/m  AORTIC VALVE LVOT Vmax:   89.40 cm/s LVOT Vmean:  64.800 cm/s LVOT VTI:    0.168 m  AORTA Ao Root diam: 3.80 cm MITRAL VALVE MV Area (PHT): 2.45 cm    SHUNTS MV Area VTI:   2.69 cm    Systemic VTI:  0.17 m MV Peak grad:  3.2 mmHg    Systemic Diam: 2.10 cm MV Mean grad:  1.0 mmHg MV Vmax:       0.89 m/s MV Vmean:      56.0 cm/s MV Decel Time: 310 msec MV E velocity: 55.50 cm/s MV A velocity: 78.60 cm/s MV E/A ratio:  0.71 Gwyndolyn Kaufman MD Electronically signed by Gwyndolyn Kaufman MD Signature Date/Time: 11/09/2022/2:19:04 PM    Final     Assessment / Plan:  53) 76 year old male admitted to the hospital 11/08/2022 with abdominal pain, elevated LFTs secondary to choledocholithiasis with ascending cholangitis.  Admission WBC count 19.  Total bili 3.7.  AST 167.  ALT 55.  CTAP with contrast showed biliary obstruction secondary to choledocholithiasis without intrahepatic biliary ductal dilatation.  On Zosyn IV every 8 hours. ERCP 11/10/2022 identified a high-grade peptic stricture and hiatal hernia but this procedure was subsequently aborted due to cap induced esophageal laceration.  Stat chest CT 12/28 identified a focal rent within the left posterolateral aspect of the distal thoracic esophagus proximal to the gastroesophageal junction, consistent with a contained tear without perforation. WBC 12.5 -> 13.2. T. Bili 8.5 -> 6.4. Alk phos 90. AST 90 -> 74. ALT 76 -> 61.  Blood cultures with  no growth 3 days. He complains of RUQ pain which is slightly worse than yesterday.  Afebrile.  Hemodynamically stable. -Tentatively planning for repeat ERCP this  weekend -Continue Zosyn IV -Continue PPI IV twice daily -Pain management per the hospitalist -Ondansetron 4 mg p.o. or IV every 6 hours. -Start clear liquid diet, patient did wish to start a soft diet.  If clear liquid diet tolerated, may advance as tolerated to a soft diet. -CBC and CMP in a.m. -Await further recommendations per Dr. Lorenso Courier  2) AKI, creatinine 1.22 -> 1.43.  On LR at 100 cc/hr. -Management per the medical service  3) Thrombocytopenia  4) Anasarca  5) History of rectal cancer with pulmonary mets. Left midlung nodular density per x-ray.  Right pulmonary nodule measuring 13 mm per chest CT.  Active Problems:   HTN (hypertension)   Rectal cancer ([place])   Choledocholithiasis with acute cholecystitis with obstruction   Lung nodule, solitary   Chronic diastolic CHF (congestive heart failure) ([place])   Alcohol abuse   DNR (do not resuscitate)   Choledocholithiasis     LOS: 2 days   Noralyn Pick  11/11/2022, 10:34 AM

## 2022-11-11 NOTE — Progress Notes (Signed)
   Inpatient Rehab Admissions Coordinator :  Per therapy recommendations patient was screened for CIR candidacy by Danne Baxter RN MSN. Patient is not yet at a level to tolerate the intensity required to pursue a CIR admit . The CIR admissions team will follow and monitor for progress and place a Rehab Consult order if felt to be appropriate. Please contact me with any questions.  Danne Baxter RN MSN Admissions Coordinator [phone]

## 2022-11-11 NOTE — Evaluation (Signed)
Physical Therapy Evaluation Patient Details Name: Stephen [name] MRN: [id number] DOB: 1946-10-24 Today's Date: 11/11/2022  History of Present Illness  76 yo male  found to have choledocholithiasis and is currently on antibiotics procedure for ERCP was planned on 12/28 but was cancelled. 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  Clinical Impression  Pt has had a significant decline in function over the years. Pt is capable of movement and was able to initiate sitting EOB at Mod A. He has previously had HHPT but with little progress and therapists were only able to come 2x/week for 30 min with limited improvement. Extensive time educating pt and spouse on different venues of skilled rehabilitation services including AIR vs SNF vs HH vs OP. Pt requires significant encouragement to participate in mobility and spouse requires extensive education on letting pt do as much as possible in order to improve mobility. Due to pt current functional status recommend skilled physical therapy services in AIR on discharge from acute care hospital setting secondary to pt has a lot of assistance at home and to improve functional mobility to decrease level of physical assist spouse provides to decrease risk for injury, falls and re-hospitalization.        Recommendations for follow up therapy are one component of a multi-disciplinary discharge planning process, led by the attending physician.  Recommendations may be updated based on patient status, additional functional criteria and insurance authorization.  Follow Up Recommendations Acute inpatient rehab (3hours/day)      Assistance Recommended at Discharge Frequent or constant Supervision/Assistance  Patient can return home with the following  Two people to help with walking and/or transfers;Assist for transportation;Help with stairs or ramp for  entrance;Assistance with cooking/housework    Equipment Recommendations Other (comment) (defer to post acute)  Recommendations for Other Services  OT consult;Rehab consult    Functional Status Assessment Patient has had a recent decline in their functional status and/or demonstrates limited ability to make significant improvements in function in a reasonable and predictable amount of time     Precautions / Restrictions Precautions Precautions: Fall Restrictions Weight Bearing Restrictions: No      Mobility  Bed Mobility Overal bed mobility: Needs Assistance Bed Mobility: Supine to Sit, Sit to Supine     Supine to sit: Mod assist Sit to supine: Mod assist   General bed mobility comments: Needs extra time and verbal cues for sequencing. Pt also requires alot of encouragement and wants spouse to do movement for him. Spouse wants to do movement for pt requires intervention/education for pt to perform movement without significant assistance from spouse.    Transfers       General transfer comment: Unable to progress due to pain in the R side with sitting EOB          Balance Overall balance assessment: Needs assistance Sitting-balance support: Bilateral upper extremity supported Sitting balance-Leahy Scale: Fair Sitting balance - Comments: leaning to the L due to pain in the R side.               Pertinent Vitals/Pain Pain Assessment Pain Assessment: 0-10 Pain Score: 3  Pain Location: pt states that pain is 7-8 in the R abdomen when he coughs Pain Descriptors / Indicators: Aching Pain Intervention(s): Limited activity within patient's tolerance, Monitored during session    Home Living Family/patient expects to be discharged to:: Private residence Living Arrangements: Spouse/significant other Available  Help at Discharge: Family;Available 24 hours/day Type of Home: House Home Access: Level entry       Home Layout: One level Home Equipment: Conservation officer, nature  (2 wheels);Hospital bed;Wheelchair - manual;Other (comment) Designer, fashion/clothing)      Prior Function Prior Level of Function : Needs assist             Mobility Comments: pt states that he is in bed all the time at home. They have a stander but they do not use it. He states that his leg rests do not elevate on his W/C       Hand Dominance   Dominant Hand: Right    Extremity/Trunk Assessment   Upper Extremity Assessment Upper Extremity Assessment: Defer to OT evaluation    Lower Extremity Assessment Lower Extremity Assessment: Generalized weakness    Cervical / Trunk Assessment Cervical / Trunk Assessment: Other exceptions Cervical / Trunk Exceptions: pain in R trunk  Communication   Communication: No difficulties  Cognition Arousal/Alertness: Awake/alert Behavior During Therapy: WFL for tasks assessed/performed Overall Cognitive Status: Within Functional Limits for tasks assessed            General Comments General comments (skin integrity, edema, etc.): Pt is very reliant on spouse and spouse wants to intervene for all movements/activities with patient. Requires extensive education and intervention in order to prevent spouse from performing movements for pt that pt can perform independently.    Exercises     Assessment/Plan    PT Assessment Patient needs continued PT services  PT Problem List Decreased strength;Decreased balance;Pain;Decreased mobility;Decreased knowledge of use of DME;Decreased activity tolerance       PT Treatment Interventions DME instruction;Functional mobility training;Balance training;Patient/family education;Gait training;Therapeutic activities;Neuromuscular re-education;Wheelchair mobility training;Therapeutic exercise;Manual techniques    PT Goals (Current goals can be found in the Care Plan section)  Acute Rehab PT Goals Patient Stated Goal: Pt and spouse were unsure of their final goal PT Goal Formulation: With patient/family Time For Goal  Achievement: 11/25/22 Potential to Achieve Goals: Fair    Frequency Min 2X/week        AM-PAC PT "6 Clicks" Mobility  Outcome Measure Help needed turning from your back to your side while in a flat bed without using bedrails?: A Lot Help needed moving from lying on your back to sitting on the side of a flat bed without using bedrails?: A Lot Help needed moving to and from a bed to a chair (including a wheelchair)?: Total Help needed standing up from a chair using your arms (e.g., wheelchair or bedside chair)?: Total Help needed to walk in hospital room?: Total Help needed climbing 3-5 steps with a railing? : Total 6 Click Score: 8    End of Session   Activity Tolerance: Patient limited by pain Patient left: in bed;with bed alarm set;with call bell/phone within reach;with family/visitor present Nurse Communication: Mobility status PT Visit Diagnosis: Other abnormalities of gait and mobility (R26.89);Muscle weakness (generalized) (M62.81)    Time: 9381-0175 PT Time Calculation (min) (ACUTE ONLY): 26 min   Charges:   PT Evaluation $PT Eval Low Complexity: 1 Low PT Treatments $Therapeutic Activity: 8-22 mins      Tomma Rakers, DPT, CLT  Acute Rehabilitation Services Office: [phone] (Secure chat preferred)   Ander Purpura 11/11/2022, 2:54 PM

## 2022-11-11 NOTE — Progress Notes (Signed)
PROGRESS NOTE    Stephen [name]  JSE:[id number] DOB: [birth year] DOA: 11/09/2022 PCP: [name], MD   Brief Narrative: 76 year old with past medical history significant for hypertension and metastatic rectal cancer 2014 with Foley and colostomy who presented to [facility] with abdominal pain, sepsis.  He developed acute abdominal pain and vomiting.  Started 2 or 3 days prior to admission.  Of note he has distal finger > toe pulp erythema with interosseous muscle atrophy.  He also has had progressive weakness over time and is now predominantly nonambulatory.  He presented with lactic acid 3.5>>> 5.6, leukocytosis 19 K, sepsis source of infection choledocholithiasis cholangitis, AST 167 ALT 55 bili 3.7.  Underwent ERCP 12/28, but procedure was aborted due to concern for CAP induce esophageal laceration. . CT chest showed: a contained tear in the esophagus. Ok to start diet per GI.   Assessment & Plan:   Active Problems:   HTN (hypertension)   Rectal cancer (HCC)   Choledocholithiasis with acute cholecystitis with obstruction   Lung nodule, solitary   Chronic diastolic CHF (congestive heart failure) (HCC)   Alcohol abuse   DNR (do not resuscitate)   Choledocholithiasis   1-Choledocholithiasis with cholangitis: -Presents with transaminases and abdominal pain.  -CT abdomen pelvis: positive for obstructive choledocholithiasis with common bile duct measuring up to 1 cm in diameter. No intrahepatic biliary ductal dilatation is noted at this time.  Cholelithiasis without evidence of acute cholecystitis. -Underwent ERCP 12/28, but procedure was aborted due to concern for CAP induce tear.  -Continue with IV Zosyn.  -Surgery following for consideration cholecystectomy.  -Pre op Clearance: See Dr [name] Note.  -PRN Zofran for nausea, IV Protonix.   Esophageal contained tear post ERCP; CT showed two contained esophageal tear.  Ok to start diet per GI.   Lung nodule: Needs Out patient follow  up/   Chronic Diastolic heart failure: Compensated.  Monitor on IV fluids.   Hypertension: PRN Hydralazine.   Remote history of metastatic rectal cancer Colostomy with poor out put.  KUB; Global dilation of small bowel loops. Findings could reflect developing small bowel obstruction or ileus. Attention on follow-up. Colostomy out put increased.   Alcohol abuse: Drinks 2-3 drinks per day.  Monitor for withdrawal.   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    Pressure Injury 03/05/21 Coccyx Medial Stage 2 -  Partial thickness loss of dermis presenting as a shallow open injury with a red, pink wound bed without slough. stage 2 pressure injury surrounded by DTI on coxxyx/sacrum (Active)  03/05/21 1639  Location: Coccyx  Location Orientation: Medial  Staging: Stage 2 -  Partial thickness loss of dermis presenting as a shallow open injury with a red, pink wound bed without slough.  Wound Description (Comments): stage 2 pressure injury surrounded by DTI on coxxyx/sacrum  Present on Admission: Yes      Estimated body mass index is 31.01 kg/m as calculated from the following:   Height as of this encounter: '5\' 7"'$  (1.702 m).   Weight as of 11/08/22: 89.8 kg.   DVT prophylaxis: SCD Code Status: DNR Family Communication: wife at bedside 12/28.  Disposition Plan:  Status is: Inpatient Remains inpatient appropriate because: management of cholangitis.     Consultants:  GI General Sx  Procedures:  ERCP  Antimicrobials:    Subjective: He is alert, nausea improved. Report right upper quadrant pain. His wife empty his colostomy bag, having more out put.  Objective: Vitals:   11/11/22 0021 11/11/22 0423 11/11/22 0833 11/11/22 1152  BP: 126/62 119/66 108/71 115/69  Pulse: 86 73 73 (!) 49  Resp: '16  16 17  '$ Temp: 97.6 F (36.4 C) 97.6 F (36.4 C) 98.1 F (36.7 C) 98.1 F (36.7 C)  TempSrc: Oral Oral Oral Oral   SpO2: 90% 90% 92% 93%  Height:        Intake/Output Summary (Last 24 hours) at 11/11/2022 1419 Last data filed at 11/11/2022 7371 Gross per 24 hour  Intake 1304 ml  Output 0 ml  Net 1304 ml    There were no vitals filed for this visit.  Examination:  General exam: NAD Respiratory system: CTA. Cardiovascular system: S 1, S 2 RRR Gastrointestinal system: BS present, soft, colostomy bag.  Central nervous system: alert, follows command Extremities: no edema   Data Reviewed: I have personally reviewed following labs and imaging studies  CBC: Recent Labs  Lab 11/08/22 0447 11/09/22 0423 11/10/22 0209 11/11/22 0718  WBC 19.0* 19.0* 12.5* 13.2*  NEUTROABS 17.3* 17.4*  --   --   HGB 17.6* 15.7 15.6 14.2  HCT 51.7 45.7 43.7 40.1  MCV 101.4* 100.4* 99.5 100.5*  PLT 168 144* 99* 101*    Basic Metabolic Panel: Recent Labs  Lab 11/08/22 0447 11/09/22 0423 11/10/22 0209 11/11/22 0718  NA 139 138 133* 135  K 3.8 4.1 4.1 3.8  CL 102 108 102 105  CO2 22 21* 23 21*  GLUCOSE 232* 142* 119* 121*  BUN '15 14 11 21  '$ CREATININE 1.30* 0.71 1.22 1.43*  CALCIUM 9.3 8.6* 9.0 8.6*    GFR: Estimated Creatinine Clearance: 47.7 mL/min (A) (by C-G formula based on SCr of 1.43 mg/dL (H)). Liver Function Tests: Recent Labs  Lab 11/08/22 0447 11/09/22 0423 11/10/22 0209 11/11/22 0718  AST 167* 116* 90* 74*  ALT 55* 69* 76* 61*  ALKPHOS 101 80 86 90  BILITOT 3.7* 8.0* 8.5* 6.4*  PROT 8.8* 7.5 7.2 6.8  ALBUMIN 4.0 3.1* 2.9* 2.5*    Recent Labs  Lab 11/08/22 0447 11/09/22 0423  LIPASE 34 30    No results for input(s): "AMMONIA" in the last 168 hours. Coagulation Profile: Recent Labs  Lab 11/09/22 1620  INR 1.5*    Cardiac Enzymes: No results for input(s): "CKTOTAL", "CKMB", "CKMBINDEX", "TROPONINI" in the last 168 hours. BNP (last 3 results) No results for input(s): "PROBNP" in the last 8760 hours. HbA1C: No results for input(s): "HGBA1C" in the last 72  hours. CBG: No results for input(s): "GLUCAP" in the last 168 hours. Lipid Profile: No results for input(s): "CHOL", "HDL", "LDLCALC", "TRIG", "CHOLHDL", "LDLDIRECT" in the last 72 hours. Thyroid Function Tests: No results for input(s): "TSH", "T4TOTAL", "FREET4", "T3FREE", "THYROIDAB" in the last 72 hours. Anemia Panel: No results for input(s): "VITAMINB12", "FOLATE", "FERRITIN", "TIBC", "IRON", "RETICCTPCT" in the last 72 hours. Sepsis Labs: Recent Labs  Lab 11/08/22 0447 11/08/22 0623 11/08/22 0954 11/08/22 1200 11/08/22 1811  PROCALCITON <0.10  --   --   --   --   LATICACIDVEN 3.5* 5.6* 3.6* 3.6* 1.4     Recent Results (from the past 240 hour(s))  Culture, blood (routine x 2)     Status: None (Preliminary result)   Collection Time: 11/08/22  4:47 AM   Specimen: BLOOD  Result Value Ref Range Status   Specimen Description BLOOD RIGHT ANTECUBITAL  Final   Special Requests   Final    BOTTLES DRAWN AEROBIC AND ANAEROBIC Blood  Culture adequate volume   Culture   Final    NO GROWTH 3 DAYS Performed at [facility], [address] [place], [place] [address]    Report Status PENDING  Incomplete  Resp panel by RT-PCR (RSV, Flu A&B, Covid) Anterior Nasal Swab     Status: None   Collection Time: 11/08/22  4:47 AM   Specimen: Anterior Nasal Swab  Result Value Ref Range Status   SARS Coronavirus 2 by RT PCR NEGATIVE NEGATIVE Final    Comment: (NOTE) SARS-CoV-2 target nucleic acids are NOT DETECTED.  The SARS-CoV-2 RNA is generally detectable in upper respiratory specimens during the acute phase of infection. The lowest concentration of SARS-CoV-2 viral copies this assay can detect is 138 copies/mL. A negative result does not preclude SARS-Cov-2 infection and should not be used as the sole basis for treatment or other patient management decisions. A negative result may occur with  improper specimen collection/handling, submission of specimen other than nasopharyngeal  swab, presence of viral mutation(s) within the areas targeted by this assay, and inadequate number of viral copies(<138 copies/mL). A negative result must be combined with clinical observations, patient history, and epidemiological information. The expected result is Negative.  Fact Sheet for Patients:  EntrepreneurPulse.com.au  Fact Sheet for Healthcare Providers:  IncredibleEmployment.be  This test is no t yet approved or cleared by the [place] FDA and  has been authorized for detection and/or diagnosis of SARS-CoV-2 by FDA under an Emergency Use Authorization (EUA). This EUA will remain  in effect (meaning this test can be used) for the duration of the COVID-19 declaration under Section 564(b)(1) of the Act, 21 U.S.C.section 360bbb-3(b)(1), unless the authorization is terminated  or revoked sooner.       Influenza A by PCR NEGATIVE NEGATIVE Final   Influenza B by PCR NEGATIVE NEGATIVE Final    Comment: (NOTE) The Xpert Xpress SARS-CoV-2/FLU/RSV plus assay is intended as an aid in the diagnosis of influenza from Nasopharyngeal swab specimens and should not be used as a sole basis for treatment. Nasal washings and aspirates are unacceptable for Xpert Xpress SARS-CoV-2/FLU/RSV testing.  Fact Sheet for Patients: EntrepreneurPulse.com.au  Fact Sheet for Healthcare Providers: IncredibleEmployment.be  This test is not yet approved or cleared by the [place] FDA and has been authorized for detection and/or diagnosis of SARS-CoV-2 by FDA under an Emergency Use Authorization (EUA). This EUA will remain in effect (meaning this test can be used) for the duration of the COVID-19 declaration under Section 564(b)(1) of the Act, 21 U.S.C. section 360bbb-3(b)(1), unless the authorization is terminated or revoked.     Resp Syncytial Virus by PCR NEGATIVE NEGATIVE Final    Comment: (NOTE) Fact Sheet for  Patients: EntrepreneurPulse.com.au  Fact Sheet for Healthcare Providers: IncredibleEmployment.be  This test is not yet approved or cleared by the [place] FDA and has been authorized for detection and/or diagnosis of SARS-CoV-2 by FDA under an Emergency Use Authorization (EUA). This EUA will remain in effect (meaning this test can be used) for the duration of the COVID-19 declaration under Section 564(b)(1) of the Act, 21 U.S.C. section 360bbb-3(b)(1), unless the authorization is terminated or revoked.  Performed at [facility], [address], [place], [place] [address]   Urine Culture     Status: Abnormal (Preliminary result)   Collection Time: 11/08/22  4:47 AM   Specimen: Urine, Catheterized  Result Value Ref Range Status   Specimen Description   Final    URINE, CATHETERIZED Performed at  [facility], [address] [place], [place] [address]    Special Requests   Final    NONE Performed at [facility], [address] [place], [place] [address]    Culture (A)  Final    >=100,000 COLONIES/mL PROTEUS MIRABILIS >=100,000 COLONIES/mL ESCHERICHIA COLI    Report Status PENDING  Incomplete   Organism ID, Bacteria PROTEUS MIRABILIS (A)  Final   Organism ID, Bacteria ESCHERICHIA COLI (A)  Final      Susceptibility   Escherichia coli - MIC*    AMPICILLIN >=32 RESISTANT Resistant     CEFAZOLIN <=4 SENSITIVE Sensitive     CEFEPIME <=0.12 SENSITIVE Sensitive     CEFTRIAXONE <=0.25 SENSITIVE Sensitive     CIPROFLOXACIN <=0.25 SENSITIVE Sensitive     GENTAMICIN <=1 SENSITIVE Sensitive     IMIPENEM <=0.25 SENSITIVE Sensitive     NITROFURANTOIN <=16 SENSITIVE Sensitive     TRIMETH/SULFA >=320 RESISTANT Resistant     AMPICILLIN/SULBACTAM 16 INTERMEDIATE Intermediate     PIP/TAZO <=4 SENSITIVE Sensitive     * >=100,000 COLONIES/mL ESCHERICHIA COLI   Proteus mirabilis - MIC*    AMPICILLIN <=2 SENSITIVE  Sensitive     CEFAZOLIN 8 SENSITIVE Sensitive     CEFEPIME <=0.12 SENSITIVE Sensitive     CEFTRIAXONE <=0.25 SENSITIVE Sensitive     CIPROFLOXACIN <=0.25 SENSITIVE Sensitive     GENTAMICIN <=1 SENSITIVE Sensitive     IMIPENEM 2 SENSITIVE Sensitive     NITROFURANTOIN 128 RESISTANT Resistant     TRIMETH/SULFA <=20 SENSITIVE Sensitive     AMPICILLIN/SULBACTAM <=2 SENSITIVE Sensitive     PIP/TAZO <=4 SENSITIVE Sensitive     * >=100,000 COLONIES/mL PROTEUS MIRABILIS         Radiology Studies: CT CHEST WO CONTRAST  Result Date: 11/10/2022 CLINICAL DATA:  Esophageal perforation EXAM: CT CHEST WITHOUT CONTRAST TECHNIQUE: Multidetector CT imaging of the chest was performed following the standard protocol without IV contrast. 50 cc of oral Omnipaque was administered prior to evaluation to assess for esophageal perforation. RADIATION DOSE REDUCTION: This exam was performed according to the departmental dose-optimization program which includes automated exposure control, adjustment of the mA and/or kV according to patient size and/or use of iterative reconstruction technique. COMPARISON:  11/08/2022, 11/10/2022, 03/05/2021 FINDINGS: Cardiovascular: Enhanced imaging of the heart and great vessels demonstrates no pericardial effusion. Normal caliber of the thoracic aorta. Atherosclerosis of the aorta and coronary vasculature. Mediastinum/Nodes: Streak artifact from the oral contrast does somewhat limit the evaluation. There appears to be a 9 mm defect within the left posterolateral aspect of the distal esophagus just proximal to the GE junction,, reference image 161/3. Contrast does not extend into the in paraesophageal soft tissues or into the upper abdomen, and this likely reflects a focal contained rent. There is also a thin linear area of contrast along the left anterolateral aspect of the upper thoracic esophagus, reference image 63-80 of series 3, which could reflect a small upper esophageal tear as  well. There is no extension of contrast into the paraesophageal soft tissues or posterior mediastinum. The thyroid and trachea are unremarkable.  No pathologic adenopathy. Lungs/Pleura: There is a spiculated part solid right apical pulmonary nodule measuring 14 x 11 x 12 mm, reference image 71/4. The solid component of this mass measures approximately 10 mm. A similar area was seen on prior chest CT 03/05/2021, obscured by surrounding multifocal pneumonia. This is highly concerning for bronchogenic neoplasm. Dependent consolidation within the right lower lobe consistent with atelectasis  or scarring. Prior partial left lower lobe resection. No airspace disease, effusion, or pneumothorax. Upper lobe predominant emphysema. The central airways are patent. Upper Abdomen: Calcified gallstones are again identified. No evidence of acute cholecystitis. The downstream common bile duct demonstrating choledocholithiasis is not included on this exam due to slice selection. Musculoskeletal: No acute or destructive bony lesions. Severe C6-7 spondylosis. Reconstructed images demonstrate no additional findings. IMPRESSION: 1. Focal rent within the left posterolateral aspect of the distal thoracic esophagus just proximal to the gastroesophageal junction. Contrast is seen extending into the defect, but does not involve the para esophageal soft tissues or extend into the upper abdomen. There is no surrounding phlegmon. This is consistent with a contained tear. 2. Thin linear tracking of contrast along the left anterolateral aspect of the proximal thoracic esophagus, which may reflect a contained tear in this region as well. No extension of contrast into the paraesophageal soft tissues or posterior mediastinum. No surrounding inflammatory changes. 3. 13 mm suspicious right part-solid pulmonary nodule within the upper lobe. Per Fleischner Society Guidelines, given the increase in size of the solid component of this part solid nodule,  recommend PET/CT, biopsy or resection. These guidelines do not apply to immunocompromised patients and patients with cancer. Follow up in patients with significant comorbidities as clinically warranted. For lung cancer screening, adhere to Lung-RADS guidelines. Reference: Radiology. 2017; 284(1):228-43. 4. Aortic Atherosclerosis (ICD10-I70.0) and Emphysema (ICD10-J43.9). 5. Cholelithiasis. Critical Value/emergent results were called by telephone at the time of interpretation on 11/10/2022 at 5:15 pm to provider [facility], who verbally acknowledged these results. Electronically Signed   By: Randa Ngo M.D.   On: 11/10/2022 17:20   DG Abd Portable 2V  Result Date: 11/10/2022 CLINICAL DATA:  Constipation and nausea in a 76 year old male. EXAM: PORTABLE ABDOMEN - 2 VIEW COMPARISON:  CT imaging from November 08, 2022. FINDINGS: Study is limited by patient body habitus. EKG leads project over the chest and abdomen. Global dilation of small bowel loops. These are moderately dilated up to 3 cm in some areas. Scattered gas-filled loops of large bowel. No free air beneath either the RIGHT or LEFT hemidiaphragm. Mild moderate gastric distension with small hiatal hernia. LEFT lower quadrant colostomy noted on prior CT. On limited assessment no acute regional skeletal findings. IMPRESSION: 1. Global dilation of small bowel loops. Findings could reflect developing small bowel obstruction or ileus. Attention on follow-up. 2. No free air. 3. Signs of hiatal hernia. Electronically Signed   By: Zetta Bills M.D.   On: 11/10/2022 10:52        Scheduled Meds:  Chlorhexidine Gluconate Cloth  6 each Topical Daily   diclofenac  100 mg Rectal Once   pantoprazole (PROTONIX) IV  40 mg Intravenous Q12H   Continuous Infusions:  lactated ringers 100 mL/hr at 11/10/22 2100   piperacillin-tazobactam (ZOSYN)  IV 3.375 g (11/11/22 0953)     LOS: 2 days    Time spent: 35 minutes.     Elmarie Shiley, MD [facility]   If 7PM-7AM, please contact night-coverage [email or web address]  11/11/2022, 2:19 PM

## 2022-11-12 ENCOUNTER — Inpatient Hospital Stay (HOSPITAL_COMMUNITY): Payer: Medicare HMO

## 2022-11-12 ENCOUNTER — Encounter (HOSPITAL_COMMUNITY): Payer: Self-pay | Admitting: Internal Medicine

## 2022-11-12 DIAGNOSIS — K810 Acute cholecystitis: Secondary | ICD-10-CM | POA: Diagnosis not present

## 2022-11-12 LAB — URINE CULTURE: Culture: >=100000 — AB

## 2022-11-12 LAB — COMPREHENSIVE METABOLIC PANEL
ALT: 49 U/L — ABNORMAL HIGH (ref 0–44)
AST: 63 U/L — ABNORMAL HIGH (ref 15–41)
Albumin: 2.2 g/dL — ABNORMAL LOW (ref 3.5–5.0)
Alkaline Phosphatase: 88 U/L (ref 38–126)
Anion gap: 10 (ref 5–15)
BUN: 16 mg/dL (ref 8–23)
CO2: 23 mmol/L (ref 22–32)
Calcium: 8.3 mg/dL — ABNORMAL LOW (ref 8.9–10.3)
Chloride: 104 mmol/L (ref 98–111)
Creatinine, Ser: 1.18 mg/dL (ref 0.61–1.24)
GFR, Estimated: >60 mL/min (ref >60)
Glucose, Bld: 113 mg/dL — ABNORMAL HIGH (ref 70–99)
Potassium: 3.4 mmol/L — ABNORMAL LOW (ref 3.5–5.1)
Sodium: 137 mmol/L (ref 135–145)
Total Bilirubin: 4.5 mg/dL — ABNORMAL HIGH (ref 0.3–1.2)
Total Protein: 5.9 g/dL — ABNORMAL LOW (ref 6.5–8.1)

## 2022-11-12 LAB — CBC
HCT: 36.6 % — ABNORMAL LOW (ref 39.0–52.0)
Hemoglobin: 13 g/dL (ref 13.0–17.0)
MCH: 35.5 pg — ABNORMAL HIGH (ref 26.0–34.0)
MCHC: 35.5 g/dL (ref 30.0–36.0)
MCV: 100 fL (ref 80.0–100.0)
Platelets: 97 10*3/uL — ABNORMAL LOW (ref 150–400)
RBC: 3.66 MIL/uL — ABNORMAL LOW (ref 4.22–5.81)
RDW: 15.1 % (ref 11.5–15.5)
WBC: 17.8 10*3/uL — ABNORMAL HIGH (ref 4.0–10.5)
nRBC: 0 % (ref 0.0–0.2)

## 2022-11-12 MED ORDER — POTASSIUM CHLORIDE CRYS ER 20 MEQ PO TBCR
40.0000 meq | EXTENDED_RELEASE_TABLET | Freq: Once | ORAL | Status: AC
  Administered 2022-11-12: 40 meq via ORAL
  Filled 2022-11-12: qty 2

## 2022-11-12 MED ORDER — GUAIFENESIN ER 600 MG PO TB12
600.0000 mg | ORAL_TABLET | Freq: Two times a day (BID) | ORAL | Status: DC
  Administered 2022-11-12 – 2022-11-17 (×11): 600 mg via ORAL
  Filled 2022-11-12 (×11): qty 1

## 2022-11-12 MED ORDER — SODIUM CHLORIDE 0.9 % IV SOLN
1.0000 g | Freq: Three times a day (TID) | INTRAVENOUS | Status: DC
  Administered 2022-11-12 – 2022-11-17 (×14): 1 g via INTRAVENOUS
  Filled 2022-11-12 (×17): qty 20

## 2022-11-12 MED ORDER — POTASSIUM CHLORIDE CRYS ER 20 MEQ PO TBCR
20.0000 meq | EXTENDED_RELEASE_TABLET | Freq: Once | ORAL | Status: AC
  Administered 2022-11-12: 20 meq via ORAL
  Filled 2022-11-12: qty 1

## 2022-11-12 MED ORDER — BENZONATATE 100 MG PO CAPS
100.0000 mg | ORAL_CAPSULE | Freq: Two times a day (BID) | ORAL | Status: DC
  Administered 2022-11-12 – 2022-11-17 (×11): 100 mg via ORAL
  Filled 2022-11-12 (×11): qty 1

## 2022-11-12 NOTE — Progress Notes (Addendum)
PROGRESS NOTE    Stephen [name]  XNA:[id number] DOB: 1946-09-09 DOA: 11/09/2022 PCP: [name], MD   Brief Narrative: 76 year old with past medical history significant for hypertension and metastatic rectal cancer 2014 with Foley and colostomy who presented to [facility] with abdominal pain, sepsis.  He developed acute abdominal pain and vomiting.  Started 2 or 3 days prior to admission.  Of note he has distal finger > toe pulp erythema with interosseous muscle atrophy.  He also has had progressive weakness over time and is now predominantly nonambulatory.  He presented with lactic acid 3.5>>> 5.6, leukocytosis 19 K, sepsis source of infection choledocholithiasis cholangitis, AST 167 ALT 55 bili 3.7.  Underwent ERCP 12/28, but procedure was aborted due to concern for CAP induce esophageal laceration. . CT chest showed: a contained tear in the esophagus. Ok to start diet per GI.   Assessment & Plan:   Active Problems:   HTN (hypertension)   Rectal cancer (HCC)   Choledocholithiasis with acute cholecystitis with obstruction   Lung nodule, solitary   Chronic diastolic CHF (congestive heart failure) (HCC)   Alcohol abuse   DNR (do not resuscitate)   Choledocholithiasis   1-Choledocholithiasis with cholangitis: -Presents with transaminases and abdominal pain.  -CT abdomen pelvis: positive for obstructive choledocholithiasis with common bile duct measuring up to 1 cm in diameter. No intrahepatic biliary ductal dilatation is noted at this time.  Cholelithiasis without evidence of acute cholecystitis. -Underwent ERCP 12/28, but procedure was aborted due to concern for CAP induce tear.  -Treated with  IV Zosyn. ---change IV antibiotics to Meropenem.  -Surgery following for consideration cholecystectomy.  -Pre op Clearance: See Dr [name] Note.  -PRN Zofran for nausea, IV Protonix.  Plan for [facility] tomorrow.  WBC increased today, afebrile, plan to change antibiotics to meropenem.    Esophageal contained tear post ERCP; CT showed two contained esophageal tear.  Started on diet.   Cough;  Repeated chest x ray: continued interstitial changes in the lung.  Start guaifenesin.   Lung nodule: Needs Out patient follow up/   Chronic Diastolic heart failure: Compensated.  Monitor on IV fluids.   Hypertension: PRN Hydralazine.   Remote history of metastatic rectal cancer Colostomy with poor out put.  KUB; Global dilation of small bowel loops. Findings could reflect developing small bowel obstruction or ileus. Attention on follow-up. Colostomy out put increased.   Alcohol abuse: Drinks 2-3 drinks per day.  Monitor for withdrawal.   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  UTI; urine grew E coli and proteus. Sensitive to zosyn and meropenem.   Pressure Injury 03/05/21 Coccyx Medial Stage 2 -  Partial thickness loss of dermis presenting as a shallow open injury with a red, pink wound bed without slough. stage 2 pressure injury surrounded by DTI on coxxyx/sacrum (Active)  03/05/21 1639  Location: Coccyx  Location Orientation: Medial  Staging: Stage 2 -  Partial thickness loss of dermis presenting as a shallow open injury with a red, pink wound bed without slough.  Wound Description (Comments): stage 2 pressure injury surrounded by DTI on coxxyx/sacrum  Present on Admission: Yes      Estimated body mass index is 31.01 kg/m as calculated from the following:   Height as of this encounter: '5\' 7"'$  (1.702 m).   Weight as of 11/08/22: 89.8 kg.   DVT prophylaxis: SCD Code Status: DNR Family Communication: wife at bedside 12/30.  Disposition Plan:  Status  is: Inpatient Remains inpatient appropriate because: management of cholangitis.     Consultants:  GI General Sx  Procedures:  ERCP  Antimicrobials:    Subjective: He report tolerating clears . No vomiting. Report abdominal right side pain.  He report persistent cough.    Objective: Vitals:   11/11/22 1629 11/11/22 2019 11/12/22 0304 11/12/22 0836  BP: 98/63 119/68 103/71 111/70  Pulse: 84 79 80 82  Resp: '17 18 18 17  '$ Temp: 98.5 F (36.9 C) 97.6 F (36.4 C) 97.7 F (36.5 C) 98.3 F (36.8 C)  TempSrc: Oral Oral Oral Oral  SpO2: 92% 92% 93% 93%  Height:        Intake/Output Summary (Last 24 hours) at 11/12/2022 1454 Last data filed at 11/12/2022 1401 Gross per 24 hour  Intake 2497.17 ml  Output 810 ml  Net 1687.17 ml    There were no vitals filed for this visit.  Examination:  General exam: NAD Respiratory system: CTA Cardiovascular system: S 1, S 2 RRR Gastrointestinal system: BS present, soft, nt colostomy bag.  Central nervous system: Alert, follows command Extremities: No edema   Data Reviewed: I have personally reviewed following labs and imaging studies  CBC: Recent Labs  Lab 11/08/22 0447 11/09/22 0423 11/10/22 0209 11/11/22 0718 11/12/22 0140  WBC 19.0* 19.0* 12.5* 13.2* 17.8*  NEUTROABS 17.3* 17.4*  --   --   --   HGB 17.6* 15.7 15.6 14.2 13.0  HCT 51.7 45.7 43.7 40.1 36.6*  MCV 101.4* 100.4* 99.5 100.5* 100.0  PLT 168 144* 99* 101* 97*    Basic Metabolic Panel: Recent Labs  Lab 11/08/22 0447 11/09/22 0423 11/10/22 0209 11/11/22 0718 11/12/22 0140  NA 139 138 133* 135 137  K 3.8 4.1 4.1 3.8 3.4*  CL 102 108 102 105 104  CO2 22 21* 23 21* 23  GLUCOSE 232* 142* 119* 121* 113*  BUN '15 14 11 21 16  '$ CREATININE 1.30* 0.71 1.22 1.43* 1.18  CALCIUM 9.3 8.6* 9.0 8.6* 8.3*    GFR: Estimated Creatinine Clearance: 57.8 mL/min (by C-G formula based on SCr of 1.18 mg/dL). Liver Function Tests: Recent Labs  Lab 11/08/22 0447 11/09/22 0423 11/10/22 0209 11/11/22 0718 11/12/22 0140  AST 167* 116* 90* 74* 63*  ALT 55* 69* 76* 61* 49*  ALKPHOS 101 80 86 90 88  BILITOT 3.7* 8.0* 8.5* 6.4* 4.5*  PROT 8.8* 7.5 7.2 6.8 5.9*  ALBUMIN 4.0 3.1* 2.9* 2.5* 2.2*    Recent Labs  Lab  11/08/22 0447 11/09/22 0423  LIPASE 34 30    No results for input(s): "AMMONIA" in the last 168 hours. Coagulation Profile: Recent Labs  Lab 11/09/22 1620  INR 1.5*    Cardiac Enzymes: No results for input(s): "CKTOTAL", "CKMB", "CKMBINDEX", "TROPONINI" in the last 168 hours. BNP (last 3 results) No results for input(s): "PROBNP" in the last 8760 hours. HbA1C: No results for input(s): "HGBA1C" in the last 72 hours. CBG: No results for input(s): "GLUCAP" in the last 168 hours. Lipid Profile: No results for input(s): "CHOL", "HDL", "LDLCALC", "TRIG", "CHOLHDL", "LDLDIRECT" in the last 72 hours. Thyroid Function Tests: No results for input(s): "TSH", "T4TOTAL", "FREET4", "T3FREE", "THYROIDAB" in the last 72 hours. Anemia Panel: No results for input(s): "VITAMINB12", "FOLATE", "FERRITIN", "TIBC", "IRON", "RETICCTPCT" in the last 72 hours. Sepsis Labs: Recent Labs  Lab 11/08/22 0447 11/08/22 0623 11/08/22 0954 11/08/22 1200 11/08/22 1811  PROCALCITON <0.10  --   --   --   --   LATICACIDVEN  3.5* 5.6* 3.6* 3.6* 1.4     Recent Results (from the past 240 hour(s))  Culture, blood (routine x 2)     Status: None (Preliminary result)   Collection Time: 11/08/22  4:47 AM   Specimen: BLOOD  Result Value Ref Range Status   Specimen Description BLOOD RIGHT ANTECUBITAL  Final   Special Requests   Final    BOTTLES DRAWN AEROBIC AND ANAEROBIC Blood Culture adequate volume   Culture   Final    NO GROWTH 4 DAYS Performed at [facility], [address], [place], [place] [address]    Report Status PENDING  Incomplete  Resp panel by RT-PCR (RSV, Flu A&B, Covid) Anterior Nasal Swab     Status: None   Collection Time: 11/08/22  4:47 AM   Specimen: Anterior Nasal Swab  Result Value Ref Range Status   SARS Coronavirus 2 by RT PCR NEGATIVE NEGATIVE Final    Comment: (NOTE) SARS-CoV-2 target nucleic acids are NOT DETECTED.  The SARS-CoV-2 RNA is generally detectable in upper  respiratory specimens during the acute phase of infection. The lowest concentration of SARS-CoV-2 viral copies this assay can detect is 138 copies/mL. A negative result does not preclude SARS-Cov-2 infection and should not be used as the sole basis for treatment or other patient management decisions. A negative result may occur with  improper specimen collection/handling, submission of specimen other than nasopharyngeal swab, presence of viral mutation(s) within the areas targeted by this assay, and inadequate number of viral copies(<138 copies/mL). A negative result must be combined with clinical observations, patient history, and epidemiological information. The expected result is Negative.  Fact Sheet for Patients:  EntrepreneurPulse.com.au  Fact Sheet for Healthcare Providers:  IncredibleEmployment.be  This test is no t yet approved or cleared by the [place] FDA and  has been authorized for detection and/or diagnosis of SARS-CoV-2 by FDA under an Emergency Use Authorization (EUA). This EUA will remain  in effect (meaning this test can be used) for the duration of the COVID-19 declaration under Section 564(b)(1) of the Act, 21 U.S.C.section 360bbb-3(b)(1), unless the authorization is terminated  or revoked sooner.       Influenza A by PCR NEGATIVE NEGATIVE Final   Influenza B by PCR NEGATIVE NEGATIVE Final    Comment: (NOTE) The Xpert Xpress SARS-CoV-2/FLU/RSV plus assay is intended as an aid in the diagnosis of influenza from Nasopharyngeal swab specimens and should not be used as a sole basis for treatment. Nasal washings and aspirates are unacceptable for Xpert Xpress SARS-CoV-2/FLU/RSV testing.  Fact Sheet for Patients: EntrepreneurPulse.com.au  Fact Sheet for Healthcare Providers: IncredibleEmployment.be  This test is not yet approved or cleared by the [place] FDA and has been  authorized for detection and/or diagnosis of SARS-CoV-2 by FDA under an Emergency Use Authorization (EUA). This EUA will remain in effect (meaning this test can be used) for the duration of the COVID-19 declaration under Section 564(b)(1) of the Act, 21 U.S.C. section 360bbb-3(b)(1), unless the authorization is terminated or revoked.     Resp Syncytial Virus by PCR NEGATIVE NEGATIVE Final    Comment: (NOTE) Fact Sheet for Patients: EntrepreneurPulse.com.au  Fact Sheet for Healthcare Providers: IncredibleEmployment.be  This test is not yet approved or cleared by the [place] FDA and has been authorized for detection and/or diagnosis of SARS-CoV-2 by FDA under an Emergency Use Authorization (EUA). This EUA will remain in effect (meaning this test can be used) for the duration of the COVID-19 declaration under Section  564(b)(1) of the Act, 21 U.S.C. section 360bbb-3(b)(1), unless the authorization is terminated or revoked.  Performed at [facility], [address] [place], [place] [address]   Urine Culture     Status: Abnormal   Collection Time: 11/08/22  4:47 AM   Specimen: Urine, Catheterized  Result Value Ref Range Status   Specimen Description   Final    URINE, CATHETERIZED Performed at [facility], [address] [place], [place] [address]    Special Requests   Final    NONE Performed at [facility], [place], [place] [address]    Culture (A)  Final    >=100,000 COLONIES/mL PROTEUS MIRABILIS >=100,000 COLONIES/mL ESCHERICHIA COLI    Report Status 11/12/2022 FINAL  Final   Organism ID, Bacteria PROTEUS MIRABILIS (A)  Final   Organism ID, Bacteria ESCHERICHIA COLI (A)  Final      Susceptibility   Escherichia coli - MIC*    AMPICILLIN >=32 RESISTANT Resistant     CEFAZOLIN <=4 SENSITIVE Sensitive     CEFEPIME <=0.12 SENSITIVE Sensitive     CEFTRIAXONE <=0.25 SENSITIVE Sensitive      CIPROFLOXACIN <=0.25 SENSITIVE Sensitive     GENTAMICIN <=1 SENSITIVE Sensitive     IMIPENEM <=0.25 SENSITIVE Sensitive     NITROFURANTOIN <=16 SENSITIVE Sensitive     TRIMETH/SULFA >=320 RESISTANT Resistant     AMPICILLIN/SULBACTAM 16 INTERMEDIATE Intermediate     PIP/TAZO <=4 SENSITIVE Sensitive     * >=100,000 COLONIES/mL ESCHERICHIA COLI   Proteus mirabilis - MIC*    AMPICILLIN <=2 SENSITIVE Sensitive     CEFAZOLIN 8 SENSITIVE Sensitive     CEFEPIME <=0.12 SENSITIVE Sensitive     CEFTRIAXONE <=0.25 SENSITIVE Sensitive     CIPROFLOXACIN <=0.25 SENSITIVE Sensitive     GENTAMICIN <=1 SENSITIVE Sensitive     IMIPENEM 2 SENSITIVE Sensitive     NITROFURANTOIN 128 RESISTANT Resistant     TRIMETH/SULFA <=20 SENSITIVE Sensitive     AMPICILLIN/SULBACTAM <=2 SENSITIVE Sensitive     PIP/TAZO <=4 SENSITIVE Sensitive     * >=100,000 COLONIES/mL PROTEUS MIRABILIS         Radiology Studies: DG CHEST PORT 1 VIEW  Result Date: 11/12/2022 CLINICAL DATA:  Leukocytosis EXAM: PORTABLE CHEST 1 VIEW COMPARISON:  CT of the chest November 10, 2022. Chest x-ray November 05, 2022. FINDINGS: Cardiomediastinal silhouette is stable. No pneumothorax. No mediastinal air identified on today's study. Continued interstitial changes in the lungs. The known suspicious nodule in the right upper lobe is not well appreciated on chest x-ray imaging. No other interval changes. IMPRESSION: 1. The known suspicious nodule in the right upper lobe is not well appreciated on chest x-ray imaging. See the recent chest CT report for evaluation recommendations. 2. Continued interstitial changes in the lungs. 3. No other acute abnormalities. Electronically Signed   By: Dorise Bullion III M.D.   On: 11/12/2022 12:06   CT CHEST WO CONTRAST  Result Date: 11/10/2022 CLINICAL DATA:  Esophageal perforation EXAM: CT CHEST WITHOUT CONTRAST TECHNIQUE: Multidetector CT imaging of the chest was performed following the standard protocol  without IV contrast. 50 cc of oral Omnipaque was administered prior to evaluation to assess for esophageal perforation. RADIATION DOSE REDUCTION: This exam was performed according to the departmental dose-optimization program which includes automated exposure control, adjustment of the mA and/or kV according to patient size and/or use of iterative reconstruction technique. COMPARISON:  11/08/2022, 11/10/2022, 03/05/2021 FINDINGS: Cardiovascular: Enhanced imaging of the heart and  great vessels demonstrates no pericardial effusion. Normal caliber of the thoracic aorta. Atherosclerosis of the aorta and coronary vasculature. Mediastinum/Nodes: Streak artifact from the oral contrast does somewhat limit the evaluation. There appears to be a 9 mm defect within the left posterolateral aspect of the distal esophagus just proximal to the GE junction,, reference image 161/3. Contrast does not extend into the in paraesophageal soft tissues or into the upper abdomen, and this likely reflects a focal contained rent. There is also a thin linear area of contrast along the left anterolateral aspect of the upper thoracic esophagus, reference image 63-80 of series 3, which could reflect a small upper esophageal tear as well. There is no extension of contrast into the paraesophageal soft tissues or posterior mediastinum. The thyroid and trachea are unremarkable.  No pathologic adenopathy. Lungs/Pleura: There is a spiculated part solid right apical pulmonary nodule measuring 14 x 11 x 12 mm, reference image 71/4. The solid component of this mass measures approximately 10 mm. A similar area was seen on prior chest CT 03/05/2021, obscured by surrounding multifocal pneumonia. This is highly concerning for bronchogenic neoplasm. Dependent consolidation within the right lower lobe consistent with atelectasis or scarring. Prior partial left lower lobe resection. No airspace disease, effusion, or pneumothorax. Upper lobe predominant emphysema.  The central airways are patent. Upper Abdomen: Calcified gallstones are again identified. No evidence of acute cholecystitis. The downstream common bile duct demonstrating choledocholithiasis is not included on this exam due to slice selection. Musculoskeletal: No acute or destructive bony lesions. Severe C6-7 spondylosis. Reconstructed images demonstrate no additional findings. IMPRESSION: 1. Focal rent within the left posterolateral aspect of the distal thoracic esophagus just proximal to the gastroesophageal junction. Contrast is seen extending into the defect, but does not involve the para esophageal soft tissues or extend into the upper abdomen. There is no surrounding phlegmon. This is consistent with a contained tear. 2. Thin linear tracking of contrast along the left anterolateral aspect of the proximal thoracic esophagus, which may reflect a contained tear in this region as well. No extension of contrast into the paraesophageal soft tissues or posterior mediastinum. No surrounding inflammatory changes. 3. 13 mm suspicious right part-solid pulmonary nodule within the upper lobe. Per Fleischner Society Guidelines, given the increase in size of the solid component of this part solid nodule, recommend PET/CT, biopsy or resection. These guidelines do not apply to immunocompromised patients and patients with cancer. Follow up in patients with significant comorbidities as clinically warranted. For lung cancer screening, adhere to Lung-RADS guidelines. Reference: Radiology. 2017; 284(1):228-43. 4. Aortic Atherosclerosis (ICD10-I70.0) and Emphysema (ICD10-J43.9). 5. Cholelithiasis. Critical Value/emergent results were called by telephone at the time of interpretation on 11/10/2022 at 5:15 pm to provider [facility], who verbally acknowledged these results. Electronically Signed   By: Randa Ngo M.D.   On: 11/10/2022 17:20        Scheduled Meds:  benzonatate  100 mg Oral BID   guaiFENesin  600 mg Oral BID    pantoprazole (PROTONIX) IV  40 mg Intravenous Q12H   potassium chloride  20 mEq Oral Once   Continuous Infusions:  lactated ringers 100 mL/hr at 11/12/22 1219   meropenem (MERREM) IV       LOS: 3 days    Time spent: 35 minutes.     Elmarie Shiley, MD [facility]   If 7PM-7AM, please contact night-coverage [email or web address]  11/12/2022, 2:54 PM

## 2022-11-12 NOTE — Progress Notes (Signed)
2 Days Post-Op   Subjective/Chief Complaint: Pt without complaint  CT reviewed  GI following and plan ERCP Sunday    Objective: Vital signs in last 24 hours: Temp:  [97.6 F (36.4 C)-98.5 F (36.9 C)] 98.3 F (36.8 C) (12/30 0836) Pulse Rate:  [49-84] 82 (12/30 0836) Resp:  [17-18] 17 (12/30 0836) BP: (98-119)/(63-71) 111/70 (12/30 0836) SpO2:  [92 %-93 %] 93 % (12/30 0836) Last BM Date : 11/11/22  Intake/Output from previous day: 12/29 0701 - 12/30 0700 In: 2495.9 [P.O.:360; I.V.:2068.1; IV Piggyback:67.8] Out: 810 [Urine:450; Stool:360] Intake/Output this shift: Total I/O In: 217.2 [I.V.:217.2] Out: -   Abd: soft mild RUQ tenderness   Lab Results:  Recent Labs    11/11/22 0718 11/12/22 0140  WBC 13.2* 17.8*  HGB 14.2 13.0  HCT 40.1 36.6*  PLT 101* 97*   BMET Recent Labs    11/11/22 0718 11/12/22 0140  NA 135 137  K 3.8 3.4*  CL 105 104  CO2 21* 23  GLUCOSE 121* 113*  BUN 21 16  CREATININE 1.43* 1.18  CALCIUM 8.6* 8.3*   PT/INR Recent Labs    11/09/22 1620  LABPROT 18.0*  INR 1.5*   ABG No results for input(s): "PHART", "HCO3" in the last 72 hours.  Invalid input(s): "PCO2", "PO2"  Studies/Results: CT CHEST WO CONTRAST  Result Date: 11/10/2022 CLINICAL DATA:  Esophageal perforation EXAM: CT CHEST WITHOUT CONTRAST TECHNIQUE: Multidetector CT imaging of the chest was performed following the standard protocol without IV contrast. 50 cc of oral Omnipaque was administered prior to evaluation to assess for esophageal perforation. RADIATION DOSE REDUCTION: This exam was performed according to the departmental dose-optimization program which includes automated exposure control, adjustment of the mA and/or kV according to patient size and/or use of iterative reconstruction technique. COMPARISON:  11/08/2022, 11/10/2022, 03/05/2021 FINDINGS: Cardiovascular: Enhanced imaging of the heart and great vessels demonstrates no pericardial effusion. Normal  caliber of the thoracic aorta. Atherosclerosis of the aorta and coronary vasculature. Mediastinum/Nodes: Streak artifact from the oral contrast does somewhat limit the evaluation. There appears to be a 9 mm defect within the left posterolateral aspect of the distal esophagus just proximal to the GE junction,, reference image 161/3. Contrast does not extend into the in paraesophageal soft tissues or into the upper abdomen, and this likely reflects a focal contained rent. There is also a thin linear area of contrast along the left anterolateral aspect of the upper thoracic esophagus, reference image 63-80 of series 3, which could reflect a small upper esophageal tear as well. There is no extension of contrast into the paraesophageal soft tissues or posterior mediastinum. The thyroid and trachea are unremarkable.  No pathologic adenopathy. Lungs/Pleura: There is a spiculated part solid right apical pulmonary nodule measuring 14 x 11 x 12 mm, reference image 71/4. The solid component of this mass measures approximately 10 mm. A similar area was seen on prior chest CT 03/05/2021, obscured by surrounding multifocal pneumonia. This is highly concerning for bronchogenic neoplasm. Dependent consolidation within the right lower lobe consistent with atelectasis or scarring. Prior partial left lower lobe resection. No airspace disease, effusion, or pneumothorax. Upper lobe predominant emphysema. The central airways are patent. Upper Abdomen: Calcified gallstones are again identified. No evidence of acute cholecystitis. The downstream common bile duct demonstrating choledocholithiasis is not included on this exam due to slice selection. Musculoskeletal: No acute or destructive bony lesions. Severe C6-7 spondylosis. Reconstructed images demonstrate no additional findings. IMPRESSION: 1. Focal rent within the left posterolateral  aspect of the distal thoracic esophagus just proximal to the gastroesophageal junction. Contrast is  seen extending into the defect, but does not involve the para esophageal soft tissues or extend into the upper abdomen. There is no surrounding phlegmon. This is consistent with a contained tear. 2. Thin linear tracking of contrast along the left anterolateral aspect of the proximal thoracic esophagus, which may reflect a contained tear in this region as well. No extension of contrast into the paraesophageal soft tissues or posterior mediastinum. No surrounding inflammatory changes. 3. 13 mm suspicious right part-solid pulmonary nodule within the upper lobe. Per Fleischner Society Guidelines, given the increase in size of the solid component of this part solid nodule, recommend PET/CT, biopsy or resection. These guidelines do not apply to immunocompromised patients and patients with cancer. Follow up in patients with significant comorbidities as clinically warranted. For lung cancer screening, adhere to Lung-RADS guidelines. Reference: Radiology. 2017; 284(1):228-43. 4. Aortic Atherosclerosis (ICD10-I70.0) and Emphysema (ICD10-J43.9). 5. Cholelithiasis. Critical Value/emergent results were called by telephone at the time of interpretation on 11/10/2022 at 5:15 pm to provider [facility], who verbally acknowledged these results. Electronically Signed   By: Randa Ngo M.D.   On: 11/10/2022 17:20   DG Abd Portable 2V  Result Date: 11/10/2022 CLINICAL DATA:  Constipation and nausea in a 76 year old male. EXAM: PORTABLE ABDOMEN - 2 VIEW COMPARISON:  CT imaging from November 08, 2022. FINDINGS: Study is limited by patient body habitus. EKG leads project over the chest and abdomen. Global dilation of small bowel loops. These are moderately dilated up to 3 cm in some areas. Scattered gas-filled loops of large bowel. No free air beneath either the RIGHT or LEFT hemidiaphragm. Mild moderate gastric distension with small hiatal hernia. LEFT lower quadrant colostomy noted on prior CT. On limited assessment no acute  regional skeletal findings. IMPRESSION: 1. Global dilation of small bowel loops. Findings could reflect developing small bowel obstruction or ileus. Attention on follow-up. 2. No free air. 3. Signs of hiatal hernia. Electronically Signed   By: Zetta Bills M.D.   On: 11/10/2022 10:52    Anti-infectives: Anti-infectives (From admission, onward)    Start     Dose/Rate Route Frequency Ordered Stop   11/09/22 1200  piperacillin-tazobactam (ZOSYN) IVPB 3.375 g        3.375 g 12.5 mL/hr over 240 Minutes Intravenous Every 8 hours 11/09/22 0951 11/16/22 1759       Assessment/Plan: Cholelithiasis with probable choledocholithiasis without signs of cholecystitis   Esophageal injury with contained injury - per GI   ERCP tomorrow    Ok to allow clears from surgery standpoint   If he develops signs of sepsis or chest pain, may want to call CVTS    LOS: 3 days    Turner Daniels MD  11/12/2022 TOTAL TIME 20 MIN

## 2022-11-12 NOTE — Progress Notes (Addendum)
Pharmacy Antibiotic Note  Stephen [name] is a 76 y.o. male admitted on 11/08/2022 with abdominal pain, elevated LFTs secondary to choledocholithiasis with ascending cholangitis. ERCP 11/10/2022 identified a high-grade peptic stricture and hiatal hernia, but subsequently aborted due to concern for CAP induced esophageal laceration. Previously on Zosyn, now with rising WBC 12.5 >> 17.8 - pharmacy consulted to switch to Meropenem for intra-abdominal infection. GI following and plans noted for possible ERCP on Sunday, 12/31.   Plan: Stop Zosyn.  Start Meropenem 1g IV q8h.  Monitor clinical course, fevers, renal function.  Follow-up cultures, LOT, and de-escalate as able.   Height: '5\' 7"'$  (170.2 cm) IBW/kg (Calculated) : 66.1  Temp (24hrs), Avg:98 F (36.7 C), Min:97.6 F (36.4 C), Max:98.5 F (36.9 C)  Recent Labs  Lab 11/08/22 0447 11/08/22 0623 11/08/22 0954 11/08/22 1200 11/08/22 1811 11/09/22 0423 11/10/22 0209 11/11/22 0718 11/12/22 0140  WBC 19.0*  --   --   --   --  19.0* 12.5* 13.2* 17.8*  CREATININE 1.30*  --   --   --   --  0.71 1.22 1.43* 1.18  LATICACIDVEN 3.5* 5.6* 3.6* 3.6* 1.4  --   --   --   --     Estimated Creatinine Clearance: 57.8 mL/min (by C-G formula based on SCr of 1.18 mg/dL).    No Known Allergies  Antimicrobials this admission: Metronidazole 12/26 x 1 Vancomycin 12/26 x 1 Cefepime 12/26 >> 12/27 Zosyn 12/27 >> 12/30 Meropenem 12/30 >>   Microbiology results: 12/26 BCx: ngtd x 4d 12/26 UCx: >=100K P.mirabilis, >=100K E.coli     Thank you for allowing pharmacy to be a part of this patient's care.  Vance Peper, PharmD PGY-2 Pharmacy Resident Phone [phone] 11/12/2022 11:19 AM   Please check AMION for all [facility] phone numbers After 10:00 PM, call [facility] [phone]

## 2022-11-12 NOTE — Progress Notes (Signed)
[facility] Progress Note  CC:  Choledocholithiasis   Subjective: He is tolerating a clear liquid diet.  He intends to try a soft diet this morning.  No nausea or vomiting.  He continues to have right and left upper abdominal pain.  No lower abdominal pain.  He stated having black liquid from his colostomy bag.  I personally emptied  contents from his colostomy bag which consisted of watery brown-greenish (not black) stool.  No chest pain or shortness of breath.   Objective:  ERCP 11/10/2022: 1. The ERCP was aborted due to cap induced esophageal laceration. NOTE: Since moving away from traditional duodenoscopes and implementing a "disposable cap" (meant to reduce the risk of duodenoscope associated ERCP infections), there have been a number of reports (and our group has personally seen cases as well) of Cap Induced lacerations of the upper GI mucosa including the esophagus and stomach. This is another such example. 2. High-grade peptic stricture and hiatal hernia. Otherwise unremarkable EGD  Vital signs in last 24 hours: Temp:  [97.6 F (36.4 C)-98.5 F (36.9 C)] 97.7 F (36.5 C) (12/30 0304) Pulse Rate:  [49-84] 80 (12/30 0304) Resp:  [16-18] 18 (12/30 0304) BP: (98-119)/(63-71) 103/71 (12/30 0304) SpO2:  [92 %-93 %] 93 % (12/30 0304) Last BM Date : 11/11/22 General: Ill-appearing 76 year old male in no acute distress. Heart: Regular rate and rhythm, no murmurs. Pulm: Breath sounds clear, decreased in the bases. Abdomen:  Soft with RUQ and LUQ tenderness without rebound or guarding.  Positive bowel sounds all 4 quadrants. Left abdominal colostomy bag draining greenish brown watery stool. Lower abdominal wall with anasarca.  No palpable mass.  Extremities: + Anasarca. Neurologic:  Alert and  oriented x 4.  Speech is clear.  Moves all extremities weakly. Psych:  Alert and cooperative. Normal mood and affect. Skin: Jaundice present.  Intake/Output from previous  day: 12/29 0701 - 12/30 0700 In: 2097.7 [P.O.:360; I.V.:1669.9; IV Piggyback:67.8] Out: 810 [Urine:450; Stool:360] Intake/Output this shift: No intake/output data recorded.  Lab Results: Recent Labs    11/10/22 0209 11/11/22 0718 11/12/22 0140  WBC 12.5* 13.2* 17.8*  HGB 15.6 14.2 13.0  HCT 43.7 40.1 36.6*  PLT 99* 101* 97*   BMET Recent Labs    11/10/22 0209 11/11/22 0718 11/12/22 0140  NA 133* 135 137  K 4.1 3.8 3.4*  CL 102 105 104  CO2 23 21* 23  GLUCOSE 119* 121* 113*  BUN '11 21 16  '$ CREATININE 1.22 1.43* 1.18  CALCIUM 9.0 8.6* 8.3*   LFT Recent Labs    11/12/22 0140  PROT 5.9*  ALBUMIN 2.2*  AST 63*  ALT 49*  ALKPHOS 88  BILITOT 4.5*   PT/INR Recent Labs    11/09/22 1620  LABPROT 18.0*  INR 1.5*   Hepatitis Panel No results for input(s): "HEPBSAG", "HCVAB", "HEPAIGM", "HEPBIGM" in the last 72 hours.  CT CHEST WO CONTRAST  Result Date: 11/10/2022 CLINICAL DATA:  Esophageal perforation EXAM: CT CHEST WITHOUT CONTRAST TECHNIQUE: Multidetector CT imaging of the chest was performed following the standard protocol without IV contrast. 50 cc of oral Omnipaque was administered prior to evaluation to assess for esophageal perforation. RADIATION DOSE REDUCTION: This exam was performed according to the departmental dose-optimization program which includes automated exposure control, adjustment of the mA and/or kV according to patient size and/or use of iterative reconstruction technique. COMPARISON:  11/08/2022, 11/10/2022, 03/05/2021 FINDINGS: Cardiovascular: Enhanced imaging of the heart and great vessels demonstrates no pericardial effusion. Normal  caliber of the thoracic aorta. Atherosclerosis of the aorta and coronary vasculature. Mediastinum/Nodes: Streak artifact from the oral contrast does somewhat limit the evaluation. There appears to be a 9 mm defect within the left posterolateral aspect of the distal esophagus just proximal to the GE junction,, reference  image 161/3. Contrast does not extend into the in paraesophageal soft tissues or into the upper abdomen, and this likely reflects a focal contained rent. There is also a thin linear area of contrast along the left anterolateral aspect of the upper thoracic esophagus, reference image 63-80 of series 3, which could reflect a small upper esophageal tear as well. There is no extension of contrast into the paraesophageal soft tissues or posterior mediastinum. The thyroid and trachea are unremarkable.  No pathologic adenopathy. Lungs/Pleura: There is a spiculated part solid right apical pulmonary nodule measuring 14 x 11 x 12 mm, reference image 71/4. The solid component of this mass measures approximately 10 mm. A similar area was seen on prior chest CT 03/05/2021, obscured by surrounding multifocal pneumonia. This is highly concerning for bronchogenic neoplasm. Dependent consolidation within the right lower lobe consistent with atelectasis or scarring. Prior partial left lower lobe resection. No airspace disease, effusion, or pneumothorax. Upper lobe predominant emphysema. The central airways are patent. Upper Abdomen: Calcified gallstones are again identified. No evidence of acute cholecystitis. The downstream common bile duct demonstrating choledocholithiasis is not included on this exam due to slice selection. Musculoskeletal: No acute or destructive bony lesions. Severe C6-7 spondylosis. Reconstructed images demonstrate no additional findings. IMPRESSION: 1. Focal rent within the left posterolateral aspect of the distal thoracic esophagus just proximal to the gastroesophageal junction. Contrast is seen extending into the defect, but does not involve the para esophageal soft tissues or extend into the upper abdomen. There is no surrounding phlegmon. This is consistent with a contained tear. 2. Thin linear tracking of contrast along the left anterolateral aspect of the proximal thoracic esophagus, which may reflect a  contained tear in this region as well. No extension of contrast into the paraesophageal soft tissues or posterior mediastinum. No surrounding inflammatory changes. 3. 13 mm suspicious right part-solid pulmonary nodule within the upper lobe. Per Fleischner Society Guidelines, given the increase in size of the solid component of this part solid nodule, recommend PET/CT, biopsy or resection. These guidelines do not apply to immunocompromised patients and patients with cancer. Follow up in patients with significant comorbidities as clinically warranted. For lung cancer screening, adhere to Lung-RADS guidelines. Reference: Radiology. 2017; 284(1):228-43. 4. Aortic Atherosclerosis (ICD10-I70.0) and Emphysema (ICD10-J43.9). 5. Cholelithiasis. Critical Value/emergent results were called by telephone at the time of interpretation on 11/10/2022 at 5:15 pm to provider [facility], who verbally acknowledged these results. Electronically Signed   By: Randa Ngo M.D.   On: 11/10/2022 17:20   DG Abd Portable 2V  Result Date: 11/10/2022 CLINICAL DATA:  Constipation and nausea in a 76 year old male. EXAM: PORTABLE ABDOMEN - 2 VIEW COMPARISON:  CT imaging from November 08, 2022. FINDINGS: Study is limited by patient body habitus. EKG leads project over the chest and abdomen. Global dilation of small bowel loops. These are moderately dilated up to 3 cm in some areas. Scattered gas-filled loops of large bowel. No free air beneath either the RIGHT or LEFT hemidiaphragm. Mild moderate gastric distension with small hiatal hernia. LEFT lower quadrant colostomy noted on prior CT. On limited assessment no acute regional skeletal findings. IMPRESSION: 1. Global dilation of small bowel loops. Findings could reflect developing small  bowel obstruction or ileus. Attention on follow-up. 2. No free air. 3. Signs of hiatal hernia. Electronically Signed   By: Zetta Bills M.D.   On: 11/10/2022 10:52    Assessment / Plan:  87) 76 year old  male admitted to the hospital 11/08/2022 with abdominal pain, elevated LFTs secondary to choledocholithiasis with ascending cholangitis.  Admission WBC count 19. Total bili 3.7.  AST 167.  ALT 55.  CTAP with contrast showed biliary obstruction secondary to choledocholithiasis without intrahepatic biliary ductal dilatation.  On Zosyn IV every 8 hours. ERCP 11/10/2022 identified a high-grade peptic stricture and hiatal hernia but this procedure was subsequently aborted due to cap induced esophageal laceration. Stat chest CT 12/28 findings consistent with a contained esophageal tear without perforation. Possible ERCP tomorrow.  Evaluated by general surgery, eventual cholecystectomy, timing to be determined. WBC 12.5 -> 13.2 -> today WBC 17.8. Blood cultures no grown day 4. LFTs drifting downward. Patient reported  black liquid stool from colostomy bag. Unlikely post ERCP attempt GI bleeding as I personally emptied contents from his colostomy bag which consisted of watery brown-greenish (not black) stool.  Hg 14.2 -> 13.  -Tentatively planning for repeat ERCP Sunday, timing to be determined by Dr. Henrene Pastor -On Zosyn with rising WBC count, ? swtich to Meropenem -Continue PPI IV twice daily -Pain management per the hospitalist -Ondansetron 4 mg p.o. or IV every 6 hours. -CBC and CMP in a.m. -Soft diet -Continue to monitory colostomy output  -Await further recommendations per Dr. Lorenso Courier   2) AKI, improving. Creatinine 1.22 -> 1.43 -> 1.18. On LR at 100 cc/hr. -Management per the medical service   3) Thrombocytopenia. PLT 97.    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  Left midlung nodular density per x-ray.  Right pulmonary nodule measuring 13 mm per chest CT.  -Monitor colostomy output as noted above  Active Problems:   HTN (hypertension)   Rectal cancer (HCC)   Choledocholithiasis with acute cholecystitis with obstruction   Lung nodule, solitary    Chronic diastolic CHF (congestive heart failure) (HCC)   Alcohol abuse   DNR (do not resuscitate)   Choledocholithiasis     LOS: 3 days   Noralyn Pick  11/12/2022, 9:44AM

## 2022-11-13 ENCOUNTER — Encounter (HOSPITAL_COMMUNITY): Payer: Self-pay | Admitting: Internal Medicine

## 2022-11-13 ENCOUNTER — Inpatient Hospital Stay (HOSPITAL_COMMUNITY): Payer: Medicare HMO

## 2022-11-13 ENCOUNTER — Encounter (HOSPITAL_COMMUNITY)
Admission: RE | Disposition: A | Discharge status: Home Health | Payer: Self-pay | Source: Ambulatory Visit | Attending: Internal Medicine

## 2022-11-13 ENCOUNTER — Inpatient Hospital Stay (HOSPITAL_COMMUNITY): Payer: Medicare HMO | Admitting: Certified Registered Nurse Anesthetist

## 2022-11-13 DIAGNOSIS — K8050 Calculus of bile duct without cholangitis or cholecystitis without obstruction: Secondary | ICD-10-CM | POA: Diagnosis not present

## 2022-11-13 DIAGNOSIS — K449 Diaphragmatic hernia without obstruction or gangrene: Secondary | ICD-10-CM | POA: Diagnosis not present

## 2022-11-13 DIAGNOSIS — K222 Esophageal obstruction: Secondary | ICD-10-CM

## 2022-11-13 DIAGNOSIS — I509 Heart failure, unspecified: Secondary | ICD-10-CM | POA: Diagnosis not present

## 2022-11-13 DIAGNOSIS — Z87891 Personal history of nicotine dependence: Secondary | ICD-10-CM

## 2022-11-13 DIAGNOSIS — I110 Hypertensive heart disease with heart failure: Secondary | ICD-10-CM | POA: Diagnosis not present

## 2022-11-13 DIAGNOSIS — K810 Acute cholecystitis: Secondary | ICD-10-CM | POA: Diagnosis not present

## 2022-11-13 HISTORY — PX: REMOVAL OF STONES: SHX5545

## 2022-11-13 HISTORY — PX: ERCP: SHX5425

## 2022-11-13 HISTORY — PX: ESOPHAGOGASTRODUODENOSCOPY: SHX5428

## 2022-11-13 HISTORY — PX: SPHINCTEROTOMY: SHX5544

## 2022-11-13 LAB — CBC WITH DIFFERENTIAL/PLATELET
Abs Immature Granulocytes: 0.3 10*3/uL — ABNORMAL HIGH (ref 0.00–0.07)
Basophils Absolute: 0.1 10*3/uL (ref 0.0–0.1)
Basophils Relative: 1 %
Eosinophils Absolute: 0.2 10*3/uL (ref 0.0–0.5)
Eosinophils Relative: 1 %
HCT: 39.2 % (ref 39.0–52.0)
Hemoglobin: 13.3 g/dL (ref 13.0–17.0)
Immature Granulocytes: 2 %
Lymphocytes Relative: 6 %
Lymphs Abs: 1.1 10*3/uL (ref 0.7–4.0)
MCH: 35 pg — ABNORMAL HIGH (ref 26.0–34.0)
MCHC: 33.9 g/dL (ref 30.0–36.0)
MCV: 103.2 fL — ABNORMAL HIGH (ref 80.0–100.0)
Monocytes Absolute: 1.4 10*3/uL — ABNORMAL HIGH (ref 0.1–1.0)
Monocytes Relative: 8 %
Neutro Abs: 14.5 10*3/uL — ABNORMAL HIGH (ref 1.7–7.7)
Neutrophils Relative %: 82 %
Platelets: 96 10*3/uL — ABNORMAL LOW (ref 150–400)
RBC: 3.8 MIL/uL — ABNORMAL LOW (ref 4.22–5.81)
RDW: 15.5 % (ref 11.5–15.5)
WBC: 17.6 10*3/uL — ABNORMAL HIGH (ref 4.0–10.5)
nRBC: 0 % (ref 0.0–0.2)

## 2022-11-13 LAB — COMPREHENSIVE METABOLIC PANEL
ALT: 44 U/L (ref 0–44)
AST: 46 U/L — ABNORMAL HIGH (ref 15–41)
Albumin: 2.3 g/dL — ABNORMAL LOW (ref 3.5–5.0)
Alkaline Phosphatase: 116 U/L (ref 38–126)
Anion gap: 7 (ref 5–15)
BUN: 11 mg/dL (ref 8–23)
CO2: 28 mmol/L (ref 22–32)
Calcium: 8.4 mg/dL — ABNORMAL LOW (ref 8.9–10.3)
Chloride: 101 mmol/L (ref 98–111)
Creatinine, Ser: 1.24 mg/dL (ref 0.61–1.24)
GFR, Estimated: >60 mL/min (ref >60)
Glucose, Bld: 117 mg/dL — ABNORMAL HIGH (ref 70–99)
Potassium: 4.6 mmol/L (ref 3.5–5.1)
Sodium: 136 mmol/L (ref 135–145)
Total Bilirubin: 4 mg/dL — ABNORMAL HIGH (ref 0.3–1.2)
Total Protein: 6.2 g/dL — ABNORMAL LOW (ref 6.5–8.1)

## 2022-11-13 LAB — PROTIME-INR
INR: 1.3 — ABNORMAL HIGH (ref 0.8–1.2)
Prothrombin Time: 16.3 seconds — ABNORMAL HIGH (ref 11.4–15.2)

## 2022-11-13 LAB — CULTURE, BLOOD (ROUTINE X 2)
Culture: NO GROWTH
Special Requests: ADEQUATE

## 2022-11-13 LAB — GLUCOSE, CAPILLARY: Glucose-Capillary: 116 mg/dL — ABNORMAL HIGH (ref 70–99)

## 2022-11-13 LAB — SURGICAL PCR SCREEN
MRSA, PCR: NEGATIVE
Staphylococcus aureus: NEGATIVE

## 2022-11-13 LAB — VITAMIN B12: Vitamin B-12: 2733 pg/mL — ABNORMAL HIGH (ref 180–914)

## 2022-11-13 LAB — LIPASE, BLOOD: Lipase: 51 U/L (ref 11–51)

## 2022-11-13 LAB — AMYLASE: Amylase: 46 U/L (ref 28–100)

## 2022-11-13 LAB — FOLATE: Folate: 8.6 ng/mL (ref >5.9)

## 2022-11-13 SURGERY — ERCP, WITH INTERVENTION IF INDICATED
Anesthesia: General

## 2022-11-13 MED ORDER — ALBUTEROL SULFATE (2.5 MG/3ML) 0.083% IN NEBU
2.5000 mg | INHALATION_SOLUTION | Freq: Once | RESPIRATORY_TRACT | Status: AC
  Administered 2022-11-13: 2.5 mg via RESPIRATORY_TRACT
  Filled 2022-11-13 (×2): qty 3

## 2022-11-13 MED ORDER — DEXAMETHASONE SODIUM PHOSPHATE 10 MG/ML IJ SOLN
INTRAMUSCULAR | Status: DC | PRN
  Administered 2022-11-13: 4 mg via INTRAVENOUS

## 2022-11-13 MED ORDER — IPRATROPIUM-ALBUTEROL 0.5-2.5 (3) MG/3ML IN SOLN
3.0000 mL | RESPIRATORY_TRACT | Status: DC
  Administered 2022-11-13: 3 mL via RESPIRATORY_TRACT

## 2022-11-13 MED ORDER — MAGNESIUM SULFATE 2 GM/50ML IV SOLN
2.0000 g | Freq: Once | INTRAVENOUS | Status: AC
  Administered 2022-11-13: 2 g via INTRAVENOUS
  Filled 2022-11-13: qty 50

## 2022-11-13 MED ORDER — IOHEXOL 9 MG/ML PO SOLN
500.0000 mL | ORAL | Status: AC
  Administered 2022-11-13 (×2): 500 mL via ORAL

## 2022-11-13 MED ORDER — PHENYLEPHRINE HCL-NACL 20-0.9 MG/250ML-% IV SOLN
INTRAVENOUS | Status: DC | PRN
  Administered 2022-11-13: 25 ug/min via INTRAVENOUS

## 2022-11-13 MED ORDER — IPRATROPIUM-ALBUTEROL 0.5-2.5 (3) MG/3ML IN SOLN
RESPIRATORY_TRACT | Status: AC
  Filled 2022-11-13: qty 3

## 2022-11-13 MED ORDER — ACETAMINOPHEN 10 MG/ML IV SOLN: 1000.0000 mg | Freq: Once | INTRAVENOUS | Status: DC | PRN

## 2022-11-13 MED ORDER — ROCURONIUM BROMIDE 10 MG/ML (PF) SYRINGE
PREFILLED_SYRINGE | INTRAVENOUS | Status: DC | PRN
  Administered 2022-11-13: 60 mg via INTRAVENOUS

## 2022-11-13 MED ORDER — GLUCAGON HCL RDNA (DIAGNOSTIC) 1 MG IJ SOLR
INTRAMUSCULAR | Status: AC
  Filled 2022-11-13: qty 1

## 2022-11-13 MED ORDER — FENTANYL CITRATE (PF) 100 MCG/2ML IJ SOLN
INTRAMUSCULAR | Status: AC
  Filled 2022-11-13: qty 2

## 2022-11-13 MED ORDER — IOHEXOL 350 MG/ML SOLN
75.0000 mL | Freq: Once | INTRAVENOUS | Status: AC | PRN
  Administered 2022-11-13: 75 mL via INTRAVENOUS

## 2022-11-13 MED ORDER — DICLOFENAC SUPPOSITORY 100 MG
RECTAL | Status: AC
  Filled 2022-11-13: qty 1

## 2022-11-13 MED ORDER — FENTANYL CITRATE (PF) 100 MCG/2ML IJ SOLN: 25.0000 ug | INTRAMUSCULAR | Status: DC | PRN

## 2022-11-13 MED ORDER — LACTATED RINGERS IV SOLN
INTRAVENOUS | Status: AC | PRN
  Administered 2022-11-13: 10 mL/h via INTRAVENOUS

## 2022-11-13 MED ORDER — INDOMETHACIN 50 MG RE SUPP
RECTAL | Status: AC
  Filled 2022-11-13: qty 2

## 2022-11-13 MED ORDER — ALBUTEROL SULFATE (2.5 MG/3ML) 0.083% IN NEBU: 2.5000 mg | INHALATION_SOLUTION | RESPIRATORY_TRACT | Status: DC | PRN

## 2022-11-13 MED ORDER — FENTANYL CITRATE (PF) 100 MCG/2ML IJ SOLN
25.0000 ug | INTRAMUSCULAR | Status: DC | PRN
  Administered 2022-11-13: 100 ug via INTRAVENOUS

## 2022-11-13 MED ORDER — SODIUM CHLORIDE 0.9 % IV SOLN
500.0000 mg | INTRAVENOUS | Status: AC
  Administered 2022-11-13 – 2022-11-17 (×5): 500 mg via INTRAVENOUS
  Filled 2022-11-13 (×5): qty 5

## 2022-11-13 MED ORDER — CIPROFLOXACIN IN D5W 400 MG/200ML IV SOLN
INTRAVENOUS | Status: AC
  Filled 2022-11-13: qty 200

## 2022-11-13 MED ORDER — SODIUM CHLORIDE 0.9 % IV SOLN: INTRAVENOUS | Status: DC

## 2022-11-13 MED ORDER — SUGAMMADEX SODIUM 200 MG/2ML IV SOLN
INTRAVENOUS | Status: DC | PRN
  Administered 2022-11-13: 200 mg via INTRAVENOUS

## 2022-11-13 MED ORDER — INDOMETHACIN 50 MG RE SUPP: 100.0000 mg | Freq: Once | RECTAL | Status: DC

## 2022-11-13 MED ORDER — LIDOCAINE 2% (20 MG/ML) 5 ML SYRINGE
INTRAMUSCULAR | Status: DC | PRN
  Administered 2022-11-13: 60 mg via INTRAVENOUS

## 2022-11-13 MED ORDER — PROPOFOL 10 MG/ML IV BOLUS
INTRAVENOUS | Status: DC | PRN
  Administered 2022-11-13: 120 mg via INTRAVENOUS

## 2022-11-13 MED ORDER — ONDANSETRON HCL 4 MG/2ML IJ SOLN
INTRAMUSCULAR | Status: DC | PRN
  Administered 2022-11-13: 4 mg via INTRAVENOUS

## 2022-11-13 MED ORDER — SODIUM CHLORIDE 0.9 % IV SOLN
INTRAVENOUS | Status: DC | PRN
  Administered 2022-11-13: 75 mL

## 2022-11-13 NOTE — Anesthesia Preprocedure Evaluation (Addendum)
Anesthesia Evaluation  Patient identified by MRN, date of birth, ID band Patient awake    Reviewed: Allergy & Precautions, NPO status , Patient's Chart, lab work & pertinent test results  Airway Mallampati: II  TM Distance: >3 FB Neck ROM: Full    Dental  (+) Upper Dentures, Lower Dentures   Pulmonary Patient abstained from smoking., former smoker   Pulmonary exam normal        Cardiovascular hypertension, +CHF   Rhythm:Regular Rate:Normal     Neuro/Psych    Depression    negative neurological ROS     GI/Hepatic Neg liver ROS,GERD  Medicated,,Choledocholithiasis    Endo/Other  negative endocrine ROS    Renal/GU negative Renal ROS  negative genitourinary   Musculoskeletal negative musculoskeletal ROS (+)    Abdominal Normal abdominal exam  (+)   Peds  Hematology negative hematology ROS (+)   Anesthesia Other Findings   Reproductive/Obstetrics                             Anesthesia Physical Anesthesia Plan  ASA: 3  Anesthesia Plan: General   Post-op Pain Management:    Induction: Intravenous  PONV Risk Score and Plan: 2 and Ondansetron, Dexamethasone and Treatment may vary due to age or medical condition  Airway Management Planned: Mask and Oral ETT  Additional Equipment: None  Intra-op Plan:   Post-operative Plan: Extubation in OR  Informed Consent: I have reviewed the patients History and Physical, chart, labs and discussed the procedure including the risks, benefits and alternatives for the proposed anesthesia with the patient or authorized representative who has indicated his/her understanding and acceptance.     Dental advisory given  Plan Discussed with: CRNA  Anesthesia Plan Comments: (Lab Results      Component                Value               Date                      WBC                      17.6 (H)            11/13/2022                HGB                       13.3                11/13/2022                HCT                      39.2                11/13/2022                MCV                      103.2 (H)           11/13/2022                PLT                      96 (L)  11/13/2022             Lab Results      Component                Value               Date                      NA                       136                 11/13/2022                K                        4.6                 11/13/2022                CO2                      28                  11/13/2022                GLUCOSE                  117 (H)             11/13/2022                BUN                      11                  11/13/2022                CREATININE               1.24                11/13/2022                CALCIUM                  8.4 (L)             11/13/2022                GFRNONAA                 >60                 11/13/2022           )       Anesthesia Quick Evaluation

## 2022-11-13 NOTE — Anesthesia Procedure Notes (Signed)
Procedure Name: Intubation Date/Time: 11/13/2022 11:20 AM  Performed by: Lowella Dell, CRNAPre-anesthesia Checklist: Patient identified, Emergency Drugs available, Suction available and Patient being monitored Patient Re-evaluated:Patient Re-evaluated prior to induction Oxygen Delivery Method: Circle System Utilized Preoxygenation: Pre-oxygenation with 100% oxygen Induction Type: IV induction Ventilation: Mask ventilation without difficulty and Oral airway inserted - appropriate to patient size Laryngoscope Size: Mac and 4 Tube type: Oral Tube size: 7.5 mm Number of attempts: 1 Airway Equipment and Method: Stylet Placement Confirmation: ETT inserted through vocal cords under direct vision, positive ETCO2 and breath sounds checked- equal and bilateral Secured at: 22 cm Tube secured with: Tape Dental Injury: Teeth and Oropharynx as per pre-operative assessment

## 2022-11-13 NOTE — Op Note (Signed)
[facility] Patient Name: Stephen [name] Procedure Date : 11/13/2022 MRN: [id number] Attending MD: Docia Chuck. Henrene Pastor , MD, [id number] Date of Birth: [birth year] CSN: [id number] Age: 76 Admit Type: Inpatient Procedure:                ERCP with sphincterotomy and common duct stone                            extraction. EGD/dilation prior Indications:              Abdominal pain in the right upper quadrant, Biliary                            dilation on Computed Tomogram Scan, Bile duct stone                            on Computed Tomogram Scan, Abnormal liver function                            test Providers:                Docia Chuck. Henrene Pastor, MD, Dulcy Fanny, [facility]                            Technician, Technician, Jeanella Cara, RN Referring MD:             Hospitalist Medicines:                General Anesthesia Complications:            No immediate complications. Estimated Blood Loss:     Estimated blood loss: none. Procedure:                Pre-Anesthesia Assessment:                           - Prior to the procedure, a History and Physical                            was performed, and patient medications and                            allergies were reviewed. The patient is competent.                            The risks and benefits of the procedure and the                            sedation options and risks were discussed with the                            patient. All questions were answered and informed                            consent was obtained. Patient identification and  proposed procedure were verified by the physician.                            Mental Status Examination: alert and oriented.                            Airway Examination: normal oropharyngeal airway and                            neck mobility. Respiratory Examination: clear to                            auscultation. CV Examination: normal.  Prophylactic                            Antibiotics: The patient was on antibiotics. Prior                            Anticoagulants: The patient has taken no                            anticoagulant or antiplatelet agents. ASA Grade                            Assessment: III - A patient with severe systemic                            disease. After reviewing the risks and benefits,                            the patient was deemed in satisfactory condition to                            undergo the procedure. The anesthesia plan was to                            use moderate sedation / analgesia (conscious                            sedation). Immediately prior to administration of                            medications, the patient was re-assessed for                            adequacy to receive sedatives. The heart rate,                            respiratory rate, oxygen saturations, blood                            pressure, adequacy of pulmonary ventilation, and  response to care were monitored throughout the                            procedure. The physical status of the patient was                            re-assessed after the procedure.                           After obtaining informed consent, the scope was                            passed under direct vision. Throughout the                            procedure, the patient's blood pressure, pulse, and                            oxygen saturations were monitored continuously. The                            [employer] D single use                            duodenoscope was introduced through the mouth, and                            used to inject contrast into and used to inject                            contrast into the bile duct and ventral pancreatic                            duct. The [id number]-H190 ([id number] Olympus endoscope was                            introduced through  the mouth, and used to inject                            contrast into. The ERCP was accomplished without                            difficulty. The patient tolerated the procedure                            well. Scope In: Scope Out: Findings:      1. EGD: Prior to ERCP, the standard upper endoscope was passed into the       esophagus. There was a rent in the proximal esophageal mucosa from prior       scope induced trauma. 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 This was balloon dilated to 15  mm. As well mid esophageal strictures were also dilated with good ring       disruption. The stomach revealed a moderate hiatal hernia but was       otherwise normal. The duodenum and postbulbar duodenum were normal. The       papilla was normal with a tiny periampullary diverticulum superior to       the orifice.      2. ERCP:      A. A scout radiograph of the abdomen with the endoscope and position was       unremarkable      B. Initial injection of contrast yielded unremarkable pancreatogram      C. The common bile duct was cannulated with the assist of a hydrophilic       guidewire. Injection of contrast demonstrated a mildly dilated bile duct       approximately 11 mm. The cystic duct and gallbladder filled. No dominant       choledocholithiasis was observed.      D. A moderate biliary sphincterotomy was made via the ERBE system. The       standard cannula was exchanged for sequential balloon. This was pulled       through the common bile duct multiple times at 12 and 15 mm diameter.       Minor sludge extracted, but no dominant stone.      E. Excellent drainage post sphincterotomy and balloon extraction. Impression:               1. EGD with esophageal dilation. See above                           2. ERCP with sphincterotomy and biliary sludge                             extraction. See above. Recommendation:           1. Observe patient's clinical course.                           2. Standard post ERCP care. The patient does not                            have a rectum due to prior surgery. Thus, cannot                            administer indomethacin rectal suppository or post                            ERCP pancreatitis prophylaxis                           3. General surgery to decide timing of                            cholecystectomy                           4. Inpatient GI team will follow-up with this  patient                           5. Pantoprazole daily for esophagitis                           I discussed the procedures and findings with the                            patient's wife Sydell Axon [phone] Procedure Code(s):        --- Professional ---                           [phone], Endoscopic retrograde                            cholangiopancreatography (ERCP); with removal of                            calculi/debris from biliary/pancreatic duct(s)                           43262, Endoscopic retrograde                            cholangiopancreatography (ERCP); with                            sphincterotomy/papillotomy Diagnosis Code(s):        --- Professional ---                           K83.8, Other specified diseases of biliary tract                           K80.50, Calculus of bile duct without cholangitis                            or cholecystitis without obstruction                           R10.11, Right upper quadrant pain                           R79.89, Other specified abnormal findings of blood                            chemistry CPT copyright 2022 [employer]. All rights reserved. The codes documented in this report are preliminary and upon coder review may  be revised to meet current compliance requirements. Docia Chuck. Henrene Pastor, MD 11/13/2022 12:55:53 PM This  report has been signed electronically. Number of Addenda: 0

## 2022-11-13 NOTE — Anesthesia Postprocedure Evaluation (Signed)
Anesthesia Post Note  Patient: Stephen [name]  Procedure(s) Performed: ENDOSCOPIC RETROGRADE CHOLANGIOPANCREATOGRAPHY (ERCP) ESOPHAGOGASTRODUODENOSCOPY (EGD) Balloon dilation wire-guided SPHINCTEROTOMY REMOVAL OF STONES     Patient location during evaluation: PACU Anesthesia Type: General Level of consciousness: awake and alert Pain management: pain level controlled Vital Signs Assessment: post-procedure vital signs reviewed and stable Respiratory status: spontaneous breathing, nonlabored ventilation, respiratory function stable and patient connected to nasal cannula oxygen Cardiovascular status: blood pressure returned to baseline and stable Postop Assessment: no apparent nausea or vomiting Anesthetic complications: no   No notable events documented.  Last Vitals:  Vitals:   11/13/22 1345 11/13/22 1422  BP: 93/75 103/72  Pulse: (!) 111 (!) 108  Resp: (!) 23 20  Temp: (!) 36.3 C 37.2 C  SpO2: 93% 92%    Last Pain:  Vitals:   11/13/22 1605  TempSrc:   PainSc: Asleep                 Belenda Cruise P Djuana Littleton

## 2022-11-13 NOTE — Transfer of Care (Signed)
Immediate Anesthesia Transfer of Care Note  Patient: Stephen [name]  Procedure(s) Performed: ENDOSCOPIC RETROGRADE CHOLANGIOPANCREATOGRAPHY (ERCP) ESOPHAGOGASTRODUODENOSCOPY (EGD) Balloon dilation wire-guided SPHINCTEROTOMY REMOVAL OF STONES  Patient Location: PACU  Anesthesia Type:General  Level of Consciousness: awake and patient cooperative  Airway & Oxygen Therapy: Patient Spontanous Breathing and Patient connected to face mask oxygen  Post-op Assessment: Report given to RN -- pt with wheezing/labored breathing upon arrival to PACU. Nebulizer administered with great improvement. (See MAR)  Post vital signs: Reviewed and stable  Last Vitals:  Vitals Value Taken Time  BP 132/71 11/13/22 1304  Temp    Pulse 110 11/13/22 1305  Resp 26 11/13/22 1305  SpO2 95 % on 4L [place] 11/13/22 1305  Vitals shown include unvalidated device data.  Last Pain:  Vitals:   11/13/22 1036  TempSrc: Temporal  PainSc: 8       Patients Stated Pain Goal: 0 (16/10/96 0454)  Complications: No notable events documented.

## 2022-11-13 NOTE — Progress Notes (Signed)
3 Days Post-Op   Subjective/Chief Complaint: No complaints today.   Objective: Vital signs in last 24 hours: Temp:  [97.3 F (36.3 C)-98.8 F (37.1 C)] 98.3 F (36.8 C) (12/31 0754) Pulse Rate:  [75-92] 86 (12/31 0754) Resp:  [16-20] 16 (12/31 0754) BP: (108-136)/(66-81) 123/81 (12/31 0754) SpO2:  [88 %-95 %] 95 % (12/31 0754) Last BM Date : 11/12/22  Intake/Output from previous day: 12/30 0701 - 12/31 0700 In: 1777.3 [P.O.:660; I.V.:967.3; IV Piggyback:150] Out: 950 [Urine:900; Stool:50] Intake/Output this shift: No intake/output data recorded.  General appearance: alert and cooperative GI: Left lower quadrant ostomy in place.  Soft nontender.  Midline scar noted.  Lab Results:  Recent Labs    11/12/22 0140 11/13/22 0152  WBC 17.8* 17.6*  HGB 13.0 13.3  HCT 36.6* 39.2  PLT 97* 96*   BMET Recent Labs    11/12/22 0140 11/13/22 0152  NA 137 136  K 3.4* 4.6  CL 104 101  CO2 23 28  GLUCOSE 113* 117*  BUN 16 11  CREATININE 1.18 1.24  CALCIUM 8.3* 8.4*   PT/INR Recent Labs    11/13/22 0152  LABPROT 16.3*  INR 1.3*   ABG No results for input(s): "PHART", "HCO3" in the last 72 hours.  Invalid input(s): "PCO2", "PO2"  Studies/Results: DG CHEST PORT 1 VIEW  Result Date: 11/12/2022 CLINICAL DATA:  Leukocytosis EXAM: PORTABLE CHEST 1 VIEW COMPARISON:  CT of the chest November 10, 2022. Chest x-ray November 05, 2022. FINDINGS: Cardiomediastinal silhouette is stable. No pneumothorax. No mediastinal air identified on today's study. Continued interstitial changes in the lungs. The known suspicious nodule in the right upper lobe is not well appreciated on chest x-ray imaging. No other interval changes. IMPRESSION: 1. The known suspicious nodule in the right upper lobe is not well appreciated on chest x-ray imaging. See the recent chest CT report for evaluation recommendations. 2. Continued interstitial changes in the lungs. 3. No other acute abnormalities.  Electronically Signed   By: Dorise Bullion III M.D.   On: 11/12/2022 12:06    Anti-infectives: Anti-infectives (From admission, onward)    Start     Dose/Rate Route Frequency Ordered Stop   11/12/22 1400  meropenem (MERREM) 1 g in sodium chloride 0.9 % 100 mL IVPB        1 g 200 mL/hr over 30 Minutes Intravenous Every 8 hours 11/12/22 1128     11/09/22 1200  piperacillin-tazobactam (ZOSYN) IVPB 3.375 g  Status:  Discontinued        3.375 g 12.5 mL/hr over 240 Minutes Intravenous Every 8 hours 11/09/22 0951 11/12/22 1058       Assessment/Plan: Cholelithiasis with probable choledocholithiasis without signs of cholecystitis    Esophageal injury with contained injury - per GI -no apparent sequelae from this.   ERCP today    Discussed timing of lap chole once ERCP done.  Tentatively scheduled for Tuesday depending on findings.  LOS: 4 days    Turner Daniels MD 11/13/2022 Total time 15 minutes

## 2022-11-13 NOTE — Progress Notes (Signed)
GI ATTENDING  I saw the patient, routinely, in the PACU after his ERCP.  He was complaining of significant worsening of his same right upper quadrant pain.  On physical exam he was quite tender in the right upper quadrant.  ERCP did not show significant stone burden and common duct.  I am concerned about cholecystitis.  I did order a stat CT scan to rule out any post ERCP complications or other causes for his significantly worsening preprocedure right upper quadrant pain.  Inpatient GI team at [facility] aware.  Docia Chuck. Geri Seminole., M.D. [facility] Division of Gastroenterology

## 2022-11-13 NOTE — Progress Notes (Addendum)
PROGRESS NOTE    Stephen [name]  GHW:[id number] DOB: May 30, 1946 DOA: 11/09/2022 PCP: [name], MD   Brief Narrative: 76 year old with past medical history significant for hypertension and metastatic rectal cancer 2014 with Foley and colostomy who presented to [facility] with abdominal pain, sepsis.  He developed acute abdominal pain and vomiting.  Started 2 or 3 days prior to admission.  Of note he has distal finger > toe pulp erythema with interosseous muscle atrophy.  He also has had progressive weakness over time and is now predominantly nonambulatory.  He presented with lactic acid 3.5>>> 5.6, leukocytosis 19 K, sepsis source of infection choledocholithiasis cholangitis, AST 167 ALT 55 bili 3.7.  Underwent ERCP 12/28, but procedure was aborted due to concern for CAP induce esophageal laceration. . CT chest showed: a contained tear in the esophagus. Ok to start diet per GI.   Assessment & Plan:   Active Problems:   HTN (hypertension)   Rectal cancer (HCC)   Choledocholithiasis with acute cholecystitis with obstruction   Lung nodule, solitary   Chronic diastolic CHF (congestive heart failure) (HCC)   Alcohol abuse   DNR (do not resuscitate)   Choledocholithiasis   Esophageal stricture   1-Choledocholithiasis with cholangitis: -Presents with transaminases and abdominal pain.  -CT abdomen pelvis: positive for obstructive choledocholithiasis with common bile duct measuring up to 1 cm in diameter. No intrahepatic biliary ductal dilatation is noted at this time.  Cholelithiasis without evidence of acute cholecystitis. -Underwent ERCP 12/28, but procedure was aborted due to concern for CAP induce tear.  -Treated with  IV Zosyn. ---change IV antibiotics to Meropenem.  -Surgery following for consideration cholecystectomy.  -Pre op Clearance: See Dr [name] Note.  -PRN Zofran for nausea, IV Protonix.  WBC increased , afebrile, antibiotics change to meropenem.  Underwent /Endoscopy ERCP  12/31:  Esophageal contained tear post ERCP; endoscopy with esophageal dilation, ERCP sphincterotomy and biliary sludge  extraction Post ERCP in PCAU complained of worsening abdominal pain. GI order STAT CT abdomen pelvis.    CT showed two contained esophageal tear.  Started on diet.   Cough; BL wheezing;  Repeated chest x ray: continued interstitial changes in the lung.  Started guaifenesin.  Incentive spirometry.  Mild Tachypnea- BL wheezing. Will give extra dose albuterol. Repeat chest x ray.  Cam back to check on patient, after breathing treatment he is not as tachypnea  as before, breathing better.   Lung nodule: Needs Out patient follow up/   Chronic Diastolic heart failure: Compensated.  Monitor on IV fluids.   Hypertension: PRN Hydralazine.   Remote history of metastatic rectal cancer Colostomy with poor out put.  KUB; Global dilation of small bowel loops. Findings could reflect developing small bowel obstruction or ileus. Attention on follow-up. Colostomy out put increased.   Alcohol abuse: Drinks 2-3 drinks per day.  Monitor for withdrawal.   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  UTI; urine grew E coli and proteus. Sensitive to zosyn and meropenem.   Pressure Injury 03/05/21 Coccyx Medial Stage 2 -  Partial thickness loss of dermis presenting as a shallow open injury with a red, pink wound bed without slough. stage 2 pressure injury surrounded by DTI on coxxyx/sacrum (Active)  03/05/21 1639  Location: Coccyx  Location Orientation: Medial  Staging: Stage 2 -  Partial thickness loss of dermis presenting as a shallow open injury with a red, pink wound bed without slough.  Wound Description (Comments):  stage 2 pressure injury surrounded by DTI on coxxyx/sacrum  Present on Admission: Yes      Estimated body mass index is 32.11 kg/m as calculated from the following:   Height as of this encounter: '5\' 7"'$   (1.702 m).   Weight as of this encounter: 93 kg.   DVT prophylaxis: SCD Code Status: DNR Family Communication: wife at bedside 12/31.  Disposition Plan:  Status is: Inpatient Remains inpatient appropriate because: management of cholangitis.     Consultants:  GI General Sx  Procedures:  ERCP  Antimicrobials:    Subjective: He is sleepy post ERCP, BL wheezing. He relates RUQ pain some improvement since procedure.    Objective: Vitals:   11/13/22 1036 11/13/22 1247 11/13/22 1300 11/13/22 1315  BP: 119/77 (!) 131/102 132/71 123/71  Pulse: 93 (!) 102 (!) 105 (!) 112  Resp: (!) 21 20 (!) 25 (!) 28  Temp: (!) 97.2 F (36.2 C) 97.6 F (36.4 C)    TempSrc: Temporal     SpO2: 92% 94% 96% 93%  Weight: 93 kg     Height: '5\' 7"'$  (1.702 m)       Intake/Output Summary (Last 24 hours) at 11/13/2022 1424 Last data filed at 11/13/2022 1225 Gross per 24 hour  Intake 1450.07 ml  Output 950 ml  Net 500.07 ml    Filed Weights   11/13/22 1036  Weight: 93 kg    Examination:  General exam: Sleepy  Respiratory system: Mild Tachypnea. BL wheezing Cardiovascular system: S 1, S 2 RRR Gastrointestinal system: BS present, soft, colostomy bag.  Central nervous system: sleepy Extremities: No edema   Data Reviewed: I have personally reviewed following labs and imaging studies  CBC: Recent Labs  Lab 11/08/22 0447 11/09/22 0423 11/10/22 0209 11/11/22 0718 11/12/22 0140 11/13/22 0152  WBC 19.0* 19.0* 12.5* 13.2* 17.8* 17.6*  NEUTROABS 17.3* 17.4*  --   --   --  14.5*  HGB 17.6* 15.7 15.6 14.2 13.0 13.3  HCT 51.7 45.7 43.7 40.1 36.6* 39.2  MCV 101.4* 100.4* 99.5 100.5* 100.0 103.2*  PLT 168 144* 99* 101* 97* 96*    Basic Metabolic Panel: Recent Labs  Lab 11/09/22 0423 11/10/22 0209 11/11/22 0718 11/12/22 0140 11/13/22 0152  NA 138 133* 135 137 136  K 4.1 4.1 3.8 3.4* 4.6  CL 108 102 105 104 101  CO2 21* 23 21* 23 28  GLUCOSE 142* 119* 121* 113* 117*  BUN '14 11  21 16 11  '$ CREATININE 0.71 1.22 1.43* 1.18 1.24  CALCIUM 8.6* 9.0 8.6* 8.3* 8.4*    GFR: Estimated Creatinine Clearance: 56 mL/min (by C-G formula based on SCr of 1.24 mg/dL). Liver Function Tests: Recent Labs  Lab 11/09/22 0423 11/10/22 0209 11/11/22 0718 11/12/22 0140 11/13/22 0152  AST 116* 90* 74* 63* 46*  ALT 69* 76* 61* 49* 44  ALKPHOS 80 86 90 88 116  BILITOT 8.0* 8.5* 6.4* 4.5* 4.0*  PROT 7.5 7.2 6.8 5.9* 6.2*  ALBUMIN 3.1* 2.9* 2.5* 2.2* 2.3*    Recent Labs  Lab 11/08/22 0447 11/09/22 0423  LIPASE 34 30    No results for input(s): "AMMONIA" in the last 168 hours. Coagulation Profile: Recent Labs  Lab 11/09/22 1620 11/13/22 0152  INR 1.5* 1.3*    Cardiac Enzymes: No results for input(s): "CKTOTAL", "CKMB", "CKMBINDEX", "TROPONINI" in the last 168 hours. BNP (last 3 results) No results for input(s): "PROBNP" in the last 8760 hours. HbA1C: No results for input(s): "HGBA1C" in the last  72 hours. CBG: Recent Labs  Lab 11/13/22 0101  GLUCAP 116*   Lipid Profile: No results for input(s): "CHOL", "HDL", "LDLCALC", "TRIG", "CHOLHDL", "LDLDIRECT" in the last 72 hours. Thyroid Function Tests: No results for input(s): "TSH", "T4TOTAL", "FREET4", "T3FREE", "THYROIDAB" in the last 72 hours. Anemia Panel: Recent Labs    11/13/22 0152 11/13/22 0830  VITAMINB12  --  2,733*  FOLATE 8.6  --    Sepsis Labs: Recent Labs  Lab 11/08/22 0447 11/08/22 0623 11/08/22 0954 11/08/22 1200 11/08/22 1811  PROCALCITON <0.10  --   --   --   --   LATICACIDVEN 3.5* 5.6* 3.6* 3.6* 1.4     Recent Results (from the past 240 hour(s))  Culture, blood (routine x 2)     Status: None   Collection Time: 11/08/22  4:47 AM   Specimen: BLOOD  Result Value Ref Range Status   Specimen Description BLOOD RIGHT ANTECUBITAL  Final   Special Requests   Final    BOTTLES DRAWN AEROBIC AND ANAEROBIC Blood Culture adequate volume   Culture   Final    NO GROWTH 5 DAYS Performed at  [facility], [address] [place], [place] [address]    Report Status 11/13/2022 FINAL  Final  Resp panel by RT-PCR (RSV, Flu A&B, Covid) Anterior Nasal Swab     Status: None   Collection Time: 11/08/22  4:47 AM   Specimen: Anterior Nasal Swab  Result Value Ref Range Status   SARS Coronavirus 2 by RT PCR NEGATIVE NEGATIVE Final    Comment: (NOTE) SARS-CoV-2 target nucleic acids are NOT DETECTED.  The SARS-CoV-2 RNA is generally detectable in upper respiratory specimens during the acute phase of infection. The lowest concentration of SARS-CoV-2 viral copies this assay can detect is 138 copies/mL. A negative result does not preclude SARS-Cov-2 infection and should not be used as the sole basis for treatment or other patient management decisions. A negative result may occur with  improper specimen collection/handling, submission of specimen other than nasopharyngeal swab, presence of viral mutation(s) within the areas targeted by this assay, and inadequate number of viral copies(<138 copies/mL). A negative result must be combined with clinical observations, patient history, and epidemiological information. The expected result is Negative.  Fact Sheet for Patients:  EntrepreneurPulse.com.au  Fact Sheet for Healthcare Providers:  IncredibleEmployment.be  This test is no t yet approved or cleared by the [place] FDA and  has been authorized for detection and/or diagnosis of SARS-CoV-2 by FDA under an Emergency Use Authorization (EUA). This EUA will remain  in effect (meaning this test can be used) for the duration of the COVID-19 declaration under Section 564(b)(1) of the Act, 21 U.S.C.section 360bbb-3(b)(1), unless the authorization is terminated  or revoked sooner.       Influenza A by PCR NEGATIVE NEGATIVE Final   Influenza B by PCR NEGATIVE NEGATIVE Final    Comment: (NOTE) The Xpert Xpress SARS-CoV-2/FLU/RSV plus assay is  intended as an aid in the diagnosis of influenza from Nasopharyngeal swab specimens and should not be used as a sole basis for treatment. Nasal washings and aspirates are unacceptable for Xpert Xpress SARS-CoV-2/FLU/RSV testing.  Fact Sheet for Patients: EntrepreneurPulse.com.au  Fact Sheet for Healthcare Providers: IncredibleEmployment.be  This test is not yet approved or cleared by the [place] FDA and has been authorized for detection and/or diagnosis of SARS-CoV-2 by FDA under an Emergency Use Authorization (EUA). This EUA will remain in effect (meaning this test can be used) for  the duration of the COVID-19 declaration under Section 564(b)(1) of the Act, 21 U.S.C. section 360bbb-3(b)(1), unless the authorization is terminated or revoked.     Resp Syncytial Virus by PCR NEGATIVE NEGATIVE Final    Comment: (NOTE) Fact Sheet for Patients: EntrepreneurPulse.com.au  Fact Sheet for Healthcare Providers: IncredibleEmployment.be  This test is not yet approved or cleared by the [place] FDA and has been authorized for detection and/or diagnosis of SARS-CoV-2 by FDA under an Emergency Use Authorization (EUA). This EUA will remain in effect (meaning this test can be used) for the duration of the COVID-19 declaration under Section 564(b)(1) of the Act, 21 U.S.C. section 360bbb-3(b)(1), unless the authorization is terminated or revoked.  Performed at [facility], [address] [place], [place] [address]   Urine Culture     Status: Abnormal   Collection Time: 11/08/22  4:47 AM   Specimen: Urine, Catheterized  Result Value Ref Range Status   Specimen Description   Final    URINE, CATHETERIZED Performed at [facility], [address] [place], [place] [address]    Special Requests   Final    NONE Performed at [facility], [address] [place], [place]  [address]    Culture (A)  Final    >=100,000 COLONIES/mL PROTEUS MIRABILIS >=100,000 COLONIES/mL ESCHERICHIA COLI    Report Status 11/12/2022 FINAL  Final   Organism ID, Bacteria PROTEUS MIRABILIS (A)  Final   Organism ID, Bacteria ESCHERICHIA COLI (A)  Final      Susceptibility   Escherichia coli - MIC*    AMPICILLIN >=32 RESISTANT Resistant     CEFAZOLIN <=4 SENSITIVE Sensitive     CEFEPIME <=0.12 SENSITIVE Sensitive     CEFTRIAXONE <=0.25 SENSITIVE Sensitive     CIPROFLOXACIN <=0.25 SENSITIVE Sensitive     GENTAMICIN <=1 SENSITIVE Sensitive     IMIPENEM <=0.25 SENSITIVE Sensitive     NITROFURANTOIN <=16 SENSITIVE Sensitive     TRIMETH/SULFA >=320 RESISTANT Resistant     AMPICILLIN/SULBACTAM 16 INTERMEDIATE Intermediate     PIP/TAZO <=4 SENSITIVE Sensitive     * >=100,000 COLONIES/mL ESCHERICHIA COLI   Proteus mirabilis - MIC*    AMPICILLIN <=2 SENSITIVE Sensitive     CEFAZOLIN 8 SENSITIVE Sensitive     CEFEPIME <=0.12 SENSITIVE Sensitive     CEFTRIAXONE <=0.25 SENSITIVE Sensitive     CIPROFLOXACIN <=0.25 SENSITIVE Sensitive     GENTAMICIN <=1 SENSITIVE Sensitive     IMIPENEM 2 SENSITIVE Sensitive     NITROFURANTOIN 128 RESISTANT Resistant     TRIMETH/SULFA <=20 SENSITIVE Sensitive     AMPICILLIN/SULBACTAM <=2 SENSITIVE Sensitive     PIP/TAZO <=4 SENSITIVE Sensitive     * >=100,000 COLONIES/mL PROTEUS MIRABILIS  Surgical pcr screen     Status: None   Collection Time: 11/13/22  2:44 AM   Specimen: Nasal Mucosa; Nasal Swab  Result Value Ref Range Status   MRSA, PCR NEGATIVE NEGATIVE Final   Staphylococcus aureus NEGATIVE NEGATIVE Final    Comment: (NOTE) The Xpert SA Assay (FDA approved for NASAL specimens in patients 29 years of age and older), is one component of a comprehensive surveillance program. It is not intended to diagnose infection nor to guide or monitor treatment. Performed at [facility], [place] [address], [place], [place] [address]           Radiology Studies: DG CHEST PORT 1 VIEW  Result Date: 11/12/2022 CLINICAL DATA:  Leukocytosis EXAM: PORTABLE CHEST 1 VIEW COMPARISON:  CT of the chest November 10, 2022. Chest x-ray November 05, 2022. FINDINGS: Cardiomediastinal silhouette is stable. No pneumothorax. No mediastinal air identified on today's study. Continued interstitial changes in the lungs. The known suspicious nodule in the right upper lobe is not well appreciated on chest x-ray imaging. No other interval changes. IMPRESSION: 1. The known suspicious nodule in the right upper lobe is not well appreciated on chest x-ray imaging. See the recent chest CT report for evaluation recommendations. 2. Continued interstitial changes in the lungs. 3. No other acute abnormalities. Electronically Signed   By: Dorise Bullion III M.D.   On: 11/12/2022 12:06        Scheduled Meds:  [MAR Hold] benzonatate  100 mg Oral BID   [MAR Hold] guaiFENesin  600 mg Oral BID   indomethacin  100 mg Rectal Once   ipratropium-albuterol  3 mL Nebulization Q4H   ipratropium-albuterol       [MAR Hold] pantoprazole (PROTONIX) IV  40 mg Intravenous Q12H   Continuous Infusions:  sodium chloride 20 mL/hr at 11/13/22 0110   acetaminophen     lactated ringers 100 mL/hr at 11/12/22 2251   [MAR Hold] meropenem (MERREM) IV 1 g (11/13/22 0514)     LOS: 4 days    Time spent: 35 minutes.     Elmarie Shiley, MD [facility]   If 7PM-7AM, please contact night-coverage [email or web address]  11/13/2022, 2:24 PM

## 2022-11-13 NOTE — Progress Notes (Addendum)
[facility] Progress Note  CC:  Choledocholithiasis    Subjective: He stated upper abdominal pain persists but is not any worse when compared to his pain level yesterday.  No nausea or vomiting.  He remains NPO for ERCP today.  He denies having any chest pain or shortness of breath.  Brown stool was emptied from his colostomy bag earlier today.  No black stools.  No family at the bedside.  Objective:   ERCP 11/10/2022: 1. The ERCP was aborted due to cap induced esophageal laceration. NOTE: Since moving away from traditional duodenoscopes and implementing a "disposable cap" (meant to reduce the risk of duodenoscope associated ERCP infections), there have been a number of reports (and our group has personally seen cases as well) of Cap Induced lacerations of the upper GI mucosa including the esophagus and stomach. This is another such example. 2. High-grade peptic stricture and hiatal hernia. Otherwise unremarkable EGD  Vital signs in last 24 hours: Temp:  [97.3 F (36.3 C)-98.8 F (37.1 C)] 98.8 F (37.1 C) (12/31 0546) Pulse Rate:  [75-92] 88 (12/31 0546) Resp:  [17-20] 18 (12/31 0546) BP: (108-136)/(66-81) 120/71 (12/31 0546) SpO2:  [88 %-93 %] 93 % (12/31 0546) Last BM Date : 11/12/22 General: Ill appearing 76 year old male in no acute distress. Heart: Regular rate and rhythm, no murmurs. Pulm: Decreased breath sounds throughout. Abdomen: Upper abdominal tenderness > RUQ without rebound or guarding.  Left abdominal colostomy stoma beefy red intact, no stool in the collection bag.  Hypoactive bowel sounds all 4 quadrants.  Diffuse abdominal anasarca. Extremities: Upper and lower extremities with anasarca. Neurologic:  Alert and oriented x 4.  Speech is clear.  Moves all extremities. Psych:  Alert and cooperative. Normal mood and affect.  Intake/Output from previous day: 12/30 0701 - 12/31 0700 In: 1777.3 [P.O.:660; I.V.:967.3; IV Piggyback:150] Out: 950  [Urine:900; Stool:50] Intake/Output this shift: No intake/output data recorded.  Lab Results: Recent Labs    11/11/22 0718 11/12/22 0140 11/13/22 0152  WBC 13.2* 17.8* 17.6*  HGB 14.2 13.0 13.3  HCT 40.1 36.6* 39.2  PLT 101* 97* 96*   BMET Recent Labs    11/11/22 0718 11/12/22 0140 11/13/22 0152  NA 135 137 136  K 3.8 3.4* 4.6  CL 105 104 101  CO2 21* 23 28  GLUCOSE 121* 113* 117*  BUN '21 16 11  '$ CREATININE 1.43* 1.18 1.24  CALCIUM 8.6* 8.3* 8.4*   LFT Recent Labs    11/13/22 0152  PROT 6.2*  ALBUMIN 2.3*  AST 46*  ALT 44  ALKPHOS 116  BILITOT 4.0*   PT/INR Recent Labs    11/13/22 0152  LABPROT 16.3*  INR 1.3*   Hepatitis Panel No results for input(s): "HEPBSAG", "HCVAB", "HEPAIGM", "HEPBIGM" in the last 72 hours.  DG CHEST PORT 1 VIEW  Result Date: 11/12/2022 CLINICAL DATA:  Leukocytosis EXAM: PORTABLE CHEST 1 VIEW COMPARISON:  CT of the chest November 10, 2022. Chest x-ray November 05, 2022. FINDINGS: Cardiomediastinal silhouette is stable. No pneumothorax. No mediastinal air identified on today's study. Continued interstitial changes in the lungs. The known suspicious nodule in the right upper lobe is not well appreciated on chest x-ray imaging. No other interval changes. IMPRESSION: 1. The known suspicious nodule in the right upper lobe is not well appreciated on chest x-ray imaging. See the recent chest CT report for evaluation recommendations. 2. Continued interstitial changes in the lungs. 3. No other acute abnormalities. Electronically Signed   By: Shanon Brow  Jimmye Norman III M.D.   On: 11/12/2022 12:06    Assessment / Plan:  32) 76 year old male admitted to the hospital 11/08/2022 with abdominal pain, elevated LFTs secondary to choledocholithiasis with ascending cholangitis.  Admission WBC count 19. Total bili 3.7.  AST 167.  ALT 55.  CTAP with contrast showed biliary obstruction secondary to choledocholithiasis without intrahepatic biliary ductal dilatation.  ERCP 11/10/2022 identified a high-grade peptic stricture and hiatal hernia but this procedure was subsequently aborted due to cap induced esophageal laceration. Stat chest CT 12/28 findings consistent with a contained esophageal tear without perforation. Evaluated by general surgery, eventual cholecystectomy, timing to be determined after ERCP completed. WBC 12.5 -> 13.2 -> 17.8 -> today WBC17.6. Blood cultures no grown day 4. Zosyn switched to Meropenem IV 12/30.  LFTs drifting downward. Hg 14.2 -> 13. INR 1.3. Stable hemoglobin 13.3.  Afebrile.  Hemodynamically stable. -Patient to proceed with ERCP with Dr. Henrene Pastor as scheduled -NPO -Continue Meropenem IV -Continue PPI IV twice daily -IV fluids and pain management per the hospitalist -Ondansetron 4 mg p.o. or IV every 6 hours. -CBC and CMP in a.m.   2) AKI. Creatinine 1.22 -> 1.43 -> 1.18 -> 1.24. On LR at 100 cc/hr. -Management per the medical service   3) Thrombocytopenia. PLT 96.    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  Left midlung nodular density per x-ray.  Right pulmonary nodule measuring 13 mm per chest CT.       Active Problems:   HTN (hypertension)   Rectal cancer ([place])   Choledocholithiasis with acute cholecystitis with obstruction   Lung nodule, solitary   Chronic diastolic CHF (congestive heart failure) ([place])   Alcohol abuse   DNR (do not resuscitate)   Choledocholithiasis     LOS: 4 days   Patrecia Pour Kennedy-Smith  11/13/2022, 08:24 AM  GI ATTENDING  Agree with above. Plan to dilate distal stricture the proceed with re attempt ERCP. The nature of the procedure, as well as the risks, benefits, and alternatives were carefully and thoroughly reviewed with the patient. Ample time for discussion and questions allowed. The patient understood, was satisfied, and agreed to proceed.  Docia Chuck. Geri Seminole., M.D. [facility] Division of Gastroenterology

## 2022-11-14 ENCOUNTER — Inpatient Hospital Stay (HOSPITAL_COMMUNITY): Payer: Medicare HMO

## 2022-11-14 DIAGNOSIS — K810 Acute cholecystitis: Secondary | ICD-10-CM | POA: Diagnosis not present

## 2022-11-14 DIAGNOSIS — R1011 Right upper quadrant pain: Secondary | ICD-10-CM

## 2022-11-14 DIAGNOSIS — R935 Abnormal findings on diagnostic imaging of other abdominal regions, including retroperitoneum: Secondary | ICD-10-CM

## 2022-11-14 DIAGNOSIS — K2100 Gastro-esophageal reflux disease with esophagitis, without bleeding: Secondary | ICD-10-CM

## 2022-11-14 DIAGNOSIS — R7989 Other specified abnormal findings of blood chemistry: Secondary | ICD-10-CM

## 2022-11-14 DIAGNOSIS — K222 Esophageal obstruction: Secondary | ICD-10-CM

## 2022-11-14 DIAGNOSIS — R1319 Other dysphagia: Secondary | ICD-10-CM

## 2022-11-14 LAB — COMPREHENSIVE METABOLIC PANEL
ALT: 36 U/L (ref 0–44)
AST: 36 U/L (ref 15–41)
Albumin: 2.3 g/dL — ABNORMAL LOW (ref 3.5–5.0)
Alkaline Phosphatase: 93 U/L (ref 38–126)
Anion gap: 11 (ref 5–15)
BUN: 10 mg/dL (ref 8–23)
CO2: 25 mmol/L (ref 22–32)
Calcium: 8.1 mg/dL — ABNORMAL LOW (ref 8.9–10.3)
Chloride: 97 mmol/L — ABNORMAL LOW (ref 98–111)
Creatinine, Ser: 1.13 mg/dL (ref 0.61–1.24)
GFR, Estimated: >60 mL/min (ref >60)
Glucose, Bld: 132 mg/dL — ABNORMAL HIGH (ref 70–99)
Potassium: 4 mmol/L (ref 3.5–5.1)
Sodium: 133 mmol/L — ABNORMAL LOW (ref 135–145)
Total Bilirubin: 2.8 mg/dL — ABNORMAL HIGH (ref 0.3–1.2)
Total Protein: 5.9 g/dL — ABNORMAL LOW (ref 6.5–8.1)

## 2022-11-14 LAB — CBC
HCT: 31.7 % — ABNORMAL LOW (ref 39.0–52.0)
Hemoglobin: 11 g/dL — ABNORMAL LOW (ref 13.0–17.0)
MCH: 35.1 pg — ABNORMAL HIGH (ref 26.0–34.0)
MCHC: 34.7 g/dL (ref 30.0–36.0)
MCV: 101.3 fL — ABNORMAL HIGH (ref 80.0–100.0)
Platelets: 131 10*3/uL — ABNORMAL LOW (ref 150–400)
RBC: 3.13 MIL/uL — ABNORMAL LOW (ref 4.22–5.81)
RDW: 15.7 % — ABNORMAL HIGH (ref 11.5–15.5)
WBC: 16.4 10*3/uL — ABNORMAL HIGH (ref 4.0–10.5)
nRBC: 0 % (ref 0.0–0.2)

## 2022-11-14 NOTE — Progress Notes (Signed)
1 Day Post-Op   Subjective/Chief Complaint: Complains of severe right upper quadrant pain.   Objective: Vital signs in last 24 hours: Temp:  [97.2 F (36.2 C)-99 F (37.2 C)] 97.8 F (36.6 C) (01/01 0642) Pulse Rate:  [91-112] 96 (01/01 0642) Resp:  [16-28] 16 (12/31 2111) BP: (93-132)/(67-102) 119/67 (01/01 0642) SpO2:  [89 %-96 %] 94 % (01/01 0642) Weight:  [93 kg] 93 kg (12/31 1036) Last BM Date : 11/12/22  Intake/Output from previous day: 12/31 0701 - 01/01 0700 In: 540 [P.O.:240; I.V.:300] Out: 350 [Urine:350] Intake/Output this shift: No intake/output data recorded.  GI: Significant right upper quadrant tenderness to palpation.  Positive Murphy sign this morning.  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  Midline scar noted.  Lab Results:  Recent Labs    11/13/22 0152 11/14/22 0606  WBC 17.6* 16.4*  HGB 13.3 11.0*  HCT 39.2 31.7*  PLT 96* 131*   BMET Recent Labs    11/13/22 0152 11/14/22 0606  NA 136 133*  K 4.6 4.0  CL 101 97*  CO2 28 25  GLUCOSE 117* 132*  BUN 11 10  CREATININE 1.24 1.13  CALCIUM 8.4* 8.1*   PT/INR Recent Labs    11/13/22 0152  LABPROT 16.3*  INR 1.3*   ABG No results for input(s): "PHART", "HCO3" in the last 72 hours.  Invalid input(s): "PCO2", "PO2"  Studies/Results: CT ABDOMEN PELVIS W CONTRAST  Result Date: 11/13/2022 CLINICAL DATA:  Acute abdominal pain following ERCP, initial encounter EXAM: CT ABDOMEN AND PELVIS WITH CONTRAST TECHNIQUE: Multidetector CT imaging of the abdomen and pelvis was performed using the standard protocol following bolus administration of intravenous contrast. RADIATION DOSE REDUCTION: This exam was performed according to the departmental dose-optimization program which includes automated exposure control, adjustment of the mA and/or kV according to patient size and/or use of iterative reconstruction technique. CONTRAST:  75m OMNIPAQUE IOHEXOL 350 MG/ML SOLN COMPARISON:  ERCP from earlier in the  same day, CT from 11/08/2022 FINDINGS: Lower chest: Bibasilar atelectasis is noted with small effusions new from the prior exam. Scattered calcified granulomas are seen. Hepatobiliary: Gallbladder is well distended with multiple dependent gallstones. A small focus of air is noted related to the recent ERCP. No biliary ductal dilatation is seen. The previously seen dilated common bile duct has resolved. No definitive filling defects are identified. The previously seen calcification in the head of the pancreas has resolved as well. Pancreas: Pancreas is within normal limits. Spleen: Normal in size without focal abnormality. Adrenals/Urinary Tract: Adrenal glands are within normal limits. Kidneys are well visualize within normal enhancement pattern. No renal calculi or obstructive changes are seen. Stable cysts are noted within the right kidney. No follow-up is recommended. The bladder is decompressed. Stomach/Bowel: Left-sided colostomy is noted. No Hartmann's pouch is noted consistent with prior low anterior resection. The appendix is within normal limits. Small bowel and stomach are unremarkable. Vascular/Lymphatic: Aortic atherosclerosis. No enlarged abdominal or pelvic lymph nodes. Reproductive: Prostate is unremarkable. Other: No abdominal wall hernia or abnormality. No abdominopelvic ascites. Musculoskeletal: Chronic compression deformities of L4 and L5 are noted. IMPRESSION: New bilateral pleural effusions with bibasilar consolidation/atelectasis. No findings to suggest acute complication from the recent ERCP and sphincterotomy. Previously seen common bile duct densities have resolved. Cholelithiasis without complicating factors. No new focal abnormality is noted. Electronically Signed   By: MInez CatalinaM.D.   On: 11/13/2022 21:27   DG ERCP  Result Date: 11/13/2022 CLINICAL DATA:  77year old male with history of choledocholithiasis  and undergoing ERCP EXAM: ERCP TECHNIQUE: Multiple spot images obtained  with the fluoroscopic device and submitted for interpretation post-procedure. FLUOROSCOPY TIME:  190.99 mGy COMPARISON:  CT abdomen pelvis from 11/08/2022 FINDINGS: There is retrograde cannulation of the common bile duct. Retrograde cholangiogram demonstrates moderate extrahepatic biliary ductal dilation with filling defects in the distal common bile duct compatible with choledocholithiasis. Balloon sweep was performed. IMPRESSION: Obstructive distal choledocholithiasis. Balloon sweep was performed with apparent clearance of choledocholithiasis. These images were submitted for radiologic interpretation only. Please see the procedural report for the full procedural details, amount of contrast, and the fluoroscopy time utilized. Ruthann Cancer, MD [facility] Electronically Signed   By: Ruthann Cancer M.D.   On: 11/13/2022 19:57   DG CHEST PORT 1 VIEW  Result Date: 11/13/2022 CLINICAL DATA:  Dyspnea EXAM: PORTABLE CHEST 1 VIEW COMPARISON:  11/12/2022 FINDINGS: Stable cardiomediastinal contours. Progressive perihilar and bibasilar interstitial opacities. Possible small bilateral pleural effusions. No pneumothorax. IMPRESSION: Progressive perihilar and bibasilar interstitial opacities. Electronically Signed   By: Davina Poke D.O.   On: 11/13/2022 16:16   DG CHEST PORT 1 VIEW  Result Date: 11/12/2022 CLINICAL DATA:  Leukocytosis EXAM: PORTABLE CHEST 1 VIEW COMPARISON:  CT of the chest November 10, 2022. Chest x-ray November 05, 2022. FINDINGS: Cardiomediastinal silhouette is stable. No pneumothorax. No mediastinal air identified on today's study. Continued interstitial changes in the lungs. The known suspicious nodule in the right upper lobe is not well appreciated on chest x-ray imaging. No other interval changes. IMPRESSION: 1. The known suspicious nodule in the right upper lobe is not well appreciated on chest x-ray imaging. See the recent chest CT  report for evaluation recommendations. 2. Continued interstitial changes in the lungs. 3. No other acute abnormalities. Electronically Signed   By: Dorise Bullion III M.D.   On: 11/12/2022 12:06    Anti-infectives: Anti-infectives (From admission, onward)    Start     Dose/Rate Route Frequency Ordered Stop   11/13/22 1730  azithromycin (ZITHROMAX) 500 mg in sodium chloride 0.9 % 250 mL IVPB        500 mg 250 mL/hr over 60 Minutes Intravenous Every 24 hours 11/13/22 1638 11/18/22 1729   11/12/22 1400  meropenem (MERREM) 1 g in sodium chloride 0.9 % 100 mL IVPB        1 g 200 mL/hr over 30 Minutes Intravenous Every 8 hours 11/12/22 1128     11/09/22 1200  piperacillin-tazobactam (ZOSYN) IVPB 3.375 g  Status:  Discontinued        3.375 g 12.5 mL/hr over 240 Minutes Intravenous Every 8 hours 11/09/22 0951 11/12/22 1058       Assessment/Plan: s/p Procedure(s): ENDOSCOPIC RETROGRADE CHOLANGIOPANCREATOGRAPHY (ERCP) (N/A) ESOPHAGOGASTRODUODENOSCOPY (EGD) (N/A) Balloon dilation wire-guided SPHINCTEROTOMY REMOVAL OF STONES  History of choledocholithiasis status post ERCP x 1 with attempted ERCP x 1 with injury to the esophagus-he now has signs of acute cholecystitis and multiple gallstones on CT scan from his admission.  He is significantly more tender today and appears more ill.  Patient with signs of acute cholecystitis now.  Given multiple complications from previous procedures and overall poor health, recommend IR cholecystostomy tube placement given significant leukocytosis, positive Murphy sign and overall poor health.  I have concerns about a general anesthetic for him in the overall trauma given the multiple issues she has had over the weekend with ERCP in overall poor health at baseline.  I discussed this with the patient and wife  at bedside.  He is currently NPO.  Consultation placed with interventional radiology.  LOS: 5 days    Turner Daniels MD 11/14/2022 Total time 45  minutes

## 2022-11-14 NOTE — Plan of Care (Signed)

## 2022-11-14 NOTE — Progress Notes (Signed)
PROGRESS NOTE    Stephen [name]  JSH:[id number] DOB: 1946-06-11 DOA: 11/09/2022 PCP: [name], MD   Brief Narrative: 77 year old with past medical history significant for hypertension and metastatic rectal cancer 2014 with Foley and colostomy who presented to [facility] with abdominal pain, sepsis.  He developed acute abdominal pain and vomiting.  Started 2 or 3 days prior to admission.  Of note he has distal finger > toe pulp erythema with interosseous muscle atrophy.  He also has had progressive weakness over time and is now predominantly nonambulatory.  He presented with lactic acid 3.5>>> 5.6, leukocytosis 19 K, sepsis source of infection choledocholithiasis cholangitis, AST 167 ALT 55 bili 3.7.  Underwent ERCP 12/28, but procedure was aborted due to concern for CAP induce esophageal laceration. . CT chest showed: a contained tear in the esophagus.  12/31: Repeated ERCP with  endoscopy with esophageal dilation, ERCP sphincterotomy and biliary sludge  extraction  Assessment & Plan:   Active Problems:   HTN (hypertension)   Rectal cancer (HCC)   Choledocholithiasis with acute cholecystitis with obstruction   Lung nodule, solitary   Chronic diastolic CHF (congestive heart failure) (HCC)   Alcohol abuse   DNR (do not resuscitate)   Choledocholithiasis   Esophageal stricture   1-Choledocholithiasis with cholangitis: -Presents with transaminases and abdominal pain.  -CT abdomen pelvis: positive for obstructive choledocholithiasis with common bile duct measuring up to 1 cm in diameter. No intrahepatic biliary ductal dilatation is noted at this time.  Cholelithiasis without evidence of acute cholecystitis. -Underwent ERCP 12/28, but procedure was aborted due to concern for CAP induce tear.  -PRN Zofran for nausea, IV Protonix.  -WBC increased , afebrile, antibiotics change to meropenem.  -Underwent /Endoscopy ERCP 12/31: endoscopy with esophageal dilation, ERCP sphincterotomy and  biliary sludge  extraction -Post ERCP in PCAU complained of worsening abdominal pain. GI order STAT CT abdomen pelvis. Which was negative for complications.  -Surgery recommend cholecystostomy tube placement by IR. IR consulted.   2-PNA, BL pleural effusion:  Acute Hypoxic Respiratory Failure:  Cough; BL wheezing;  Repeated chest x ray: continued interstitial changes in the lung.  Started guaifenesin.  Incentive spirometry.  CT abdomen; showed Bilateral effusion, consolidation.   CT showed two contained esophageal tear.  No further recommendation per GI.   Lung nodule: Needs Out patient follow up/   Chronic Diastolic heart failure: Compensated.  Monitor on IV fluids.   Hypertension: PRN Hydralazine.   Remote history of metastatic rectal cancer Colostomy with poor out put.  KUB; Global dilation of small bowel loops. Findings could reflect developing small bowel obstruction or ileus. Attention on follow-up. Colostomy out put increased.   Alcohol abuse: Drinks 2-3 drinks per day.  Monitor for withdrawal.   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   Mild Hyponatremia; on IV fluids.  UTI; urine grew E coli and proteus. Sensitive to zosyn and meropenem.   Pressure Injury 03/05/21 Coccyx Medial Stage 2 -  Partial thickness loss of dermis presenting as a shallow open injury with a red, pink wound bed without slough. stage 2 pressure injury surrounded by DTI on coxxyx/sacrum (Active)  03/05/21 1639  Location: Coccyx  Location Orientation: Medial  Staging: Stage 2 -  Partial thickness loss of dermis presenting as a shallow open injury with a red, pink wound bed without slough.  Wound Description (Comments): stage 2 pressure injury surrounded by DTI on coxxyx/sacrum  Present on Admission: Yes  Estimated body mass index is 32.11 kg/m as calculated from the following:   Height as of this encounter: '5\' 7"'$   (1.702 m).   Weight as of this encounter: 93 kg.   DVT prophylaxis: SCD Code Status: DNR Family Communication: wife at bedside 12/31.  Disposition Plan:  Status is: Inpatient Remains inpatient appropriate because: management of cholangitis.     Consultants:  GI General Sx  Procedures:  ERCP  Antimicrobials:    Subjective: He appears breathing better. He is 4 L oxygen. He report abdominal pain.     Objective: Vitals:   11/13/22 2111 11/13/22 2112 11/14/22 0642 11/14/22 1117  BP: 123/73  119/67 108/60  Pulse: 91  96 78  Resp: 16   17  Temp: 97.6 F (36.4 C)  97.8 F (36.6 C) 98.6 F (37 C)  TempSrc: Oral  Oral Oral  SpO2: (!) 89% 94% 94% 96%  Weight:      Height:        Intake/Output Summary (Last 24 hours) at 11/14/2022 1310 Last data filed at 11/14/2022 1113 Gross per 24 hour  Intake 1440 ml  Output 350 ml  Net 1090 ml    Filed Weights   11/13/22 1036  Weight: 93 kg    Examination:  General exam: Alert, NAD Respiratory system: Normal resp effort, No wheezing.  Cardiovascular system: S 1, S 2 RRR Gastrointestinal system: BS present, soft, tender, colostomy bag.  Central nervous system: alert, follows command Extremities: no edema   Data Reviewed: I have personally reviewed following labs and imaging studies  CBC: Recent Labs  Lab 11/08/22 0447 11/09/22 0423 11/10/22 0209 11/11/22 0718 11/12/22 0140 11/13/22 0152 11/14/22 0606  WBC 19.0* 19.0* 12.5* 13.2* 17.8* 17.6* 16.4*  NEUTROABS 17.3* 17.4*  --   --   --  14.5*  --   HGB 17.6* 15.7 15.6 14.2 13.0 13.3 11.0*  HCT 51.7 45.7 43.7 40.1 36.6* 39.2 31.7*  MCV 101.4* 100.4* 99.5 100.5* 100.0 103.2* 101.3*  PLT 168 144* 99* 101* 97* 96* 131*    Basic Metabolic Panel: Recent Labs  Lab 11/10/22 0209 11/11/22 0718 11/12/22 0140 11/13/22 0152 11/14/22 0606  NA 133* 135 137 136 133*  K 4.1 3.8 3.4* 4.6 4.0  CL 102 105 104 101 97*  CO2 23 21* '23 28 25  '$ GLUCOSE 119* 121* 113* 117*  132*  BUN '11 21 16 11 10  '$ CREATININE 1.22 1.43* 1.18 1.24 1.13  CALCIUM 9.0 8.6* 8.3* 8.4* 8.1*    GFR: Estimated Creatinine Clearance: 61.4 mL/min (by C-G formula based on SCr of 1.13 mg/dL). Liver Function Tests: Recent Labs  Lab 11/10/22 0209 11/11/22 0718 11/12/22 0140 11/13/22 0152 11/14/22 0606  AST 90* 74* 63* 46* 36  ALT 76* 61* 49* 44 36  ALKPHOS 86 90 88 116 93  BILITOT 8.5* 6.4* 4.5* 4.0* 2.8*  PROT 7.2 6.8 5.9* 6.2* 5.9*  ALBUMIN 2.9* 2.5* 2.2* 2.3* 2.3*    Recent Labs  Lab 11/08/22 0447 11/09/22 0423 11/13/22 1330  LIPASE 34 30 51  AMYLASE  --   --  46    No results for input(s): "AMMONIA" in the last 168 hours. Coagulation Profile: Recent Labs  Lab 11/09/22 1620 11/13/22 0152  INR 1.5* 1.3*    Cardiac Enzymes: No results for input(s): "CKTOTAL", "CKMB", "CKMBINDEX", "TROPONINI" in the last 168 hours. BNP (last 3 results) No results for input(s): "PROBNP" in the last 8760 hours. HbA1C: No results for input(s): "HGBA1C" in the last 72  hours. CBG: Recent Labs  Lab 11/13/22 0101  GLUCAP 116*    Lipid Profile: No results for input(s): "CHOL", "HDL", "LDLCALC", "TRIG", "CHOLHDL", "LDLDIRECT" in the last 72 hours. Thyroid Function Tests: No results for input(s): "TSH", "T4TOTAL", "FREET4", "T3FREE", "THYROIDAB" in the last 72 hours. Anemia Panel: Recent Labs    11/13/22 0152 11/13/22 0830  VITAMINB12  --  2,733*  FOLATE 8.6  --     Sepsis Labs: Recent Labs  Lab 11/08/22 0447 11/08/22 0623 11/08/22 0954 11/08/22 1200 11/08/22 1811  PROCALCITON <0.10  --   --   --   --   LATICACIDVEN 3.5* 5.6* 3.6* 3.6* 1.4     Recent Results (from the past 240 hour(s))  Culture, blood (routine x 2)     Status: None   Collection Time: 11/08/22  4:47 AM   Specimen: BLOOD  Result Value Ref Range Status   Specimen Description BLOOD RIGHT ANTECUBITAL  Final   Special Requests   Final    BOTTLES DRAWN AEROBIC AND ANAEROBIC Blood Culture adequate  volume   Culture   Final    NO GROWTH 5 DAYS Performed at [facility], [address] [place], [place] [address]    Report Status 11/13/2022 FINAL  Final  Resp panel by RT-PCR (RSV, Flu A&B, Covid) Anterior Nasal Swab     Status: None   Collection Time: 11/08/22  4:47 AM   Specimen: Anterior Nasal Swab  Result Value Ref Range Status   SARS Coronavirus 2 by RT PCR NEGATIVE NEGATIVE Final    Comment: (NOTE) SARS-CoV-2 target nucleic acids are NOT DETECTED.  The SARS-CoV-2 RNA is generally detectable in upper respiratory specimens during the acute phase of infection. The lowest concentration of SARS-CoV-2 viral copies this assay can detect is 138 copies/mL. A negative result does not preclude SARS-Cov-2 infection and should not be used as the sole basis for treatment or other patient management decisions. A negative result may occur with  improper specimen collection/handling, submission of specimen other than nasopharyngeal swab, presence of viral mutation(s) within the areas targeted by this assay, and inadequate number of viral copies(<138 copies/mL). A negative result must be combined with clinical observations, patient history, and epidemiological information. The expected result is Negative.  Fact Sheet for Patients:  EntrepreneurPulse.com.au  Fact Sheet for Healthcare Providers:  IncredibleEmployment.be  This test is no t yet approved or cleared by the [place] FDA and  has been authorized for detection and/or diagnosis of SARS-CoV-2 by FDA under an Emergency Use Authorization (EUA). This EUA will remain  in effect (meaning this test can be used) for the duration of the COVID-19 declaration under Section 564(b)(1) of the Act, 21 U.S.C.section 360bbb-3(b)(1), unless the authorization is terminated  or revoked sooner.       Influenza A by PCR NEGATIVE NEGATIVE Final   Influenza B by PCR NEGATIVE NEGATIVE Final     Comment: (NOTE) The Xpert Xpress SARS-CoV-2/FLU/RSV plus assay is intended as an aid in the diagnosis of influenza from Nasopharyngeal swab specimens and should not be used as a sole basis for treatment. Nasal washings and aspirates are unacceptable for Xpert Xpress SARS-CoV-2/FLU/RSV testing.  Fact Sheet for Patients: EntrepreneurPulse.com.au  Fact Sheet for Healthcare Providers: IncredibleEmployment.be  This test is not yet approved or cleared by the [place] FDA and has been authorized for detection and/or diagnosis of SARS-CoV-2 by FDA under an Emergency Use Authorization (EUA). This EUA will remain in effect (meaning this test can be used)  for the duration of the COVID-19 declaration under Section 564(b)(1) of the Act, 21 U.S.C. section 360bbb-3(b)(1), unless the authorization is terminated or revoked.     Resp Syncytial Virus by PCR NEGATIVE NEGATIVE Final    Comment: (NOTE) Fact Sheet for Patients: EntrepreneurPulse.com.au  Fact Sheet for Healthcare Providers: IncredibleEmployment.be  This test is not yet approved or cleared by the [place] FDA and has been authorized for detection and/or diagnosis of SARS-CoV-2 by FDA under an Emergency Use Authorization (EUA). This EUA will remain in effect (meaning this test can be used) for the duration of the COVID-19 declaration under Section 564(b)(1) of the Act, 21 U.S.C. section 360bbb-3(b)(1), unless the authorization is terminated or revoked.  Performed at [facility], [address] [place], [place] [address]   Urine Culture     Status: Abnormal   Collection Time: 11/08/22  4:47 AM   Specimen: Urine, Catheterized  Result Value Ref Range Status   Specimen Description   Final    URINE, CATHETERIZED Performed at [facility], [address] [place], [place] [address]    Special Requests   Final    NONE Performed at  [facility], [address] [place], [place] [address]    Culture (A)  Final    >=100,000 COLONIES/mL PROTEUS MIRABILIS >=100,000 COLONIES/mL ESCHERICHIA COLI    Report Status 11/12/2022 FINAL  Final   Organism ID, Bacteria PROTEUS MIRABILIS (A)  Final   Organism ID, Bacteria ESCHERICHIA COLI (A)  Final      Susceptibility   Escherichia coli - MIC*    AMPICILLIN >=32 RESISTANT Resistant     CEFAZOLIN <=4 SENSITIVE Sensitive     CEFEPIME <=0.12 SENSITIVE Sensitive     CEFTRIAXONE <=0.25 SENSITIVE Sensitive     CIPROFLOXACIN <=0.25 SENSITIVE Sensitive     GENTAMICIN <=1 SENSITIVE Sensitive     IMIPENEM <=0.25 SENSITIVE Sensitive     NITROFURANTOIN <=16 SENSITIVE Sensitive     TRIMETH/SULFA >=320 RESISTANT Resistant     AMPICILLIN/SULBACTAM 16 INTERMEDIATE Intermediate     PIP/TAZO <=4 SENSITIVE Sensitive     * >=100,000 COLONIES/mL ESCHERICHIA COLI   Proteus mirabilis - MIC*    AMPICILLIN <=2 SENSITIVE Sensitive     CEFAZOLIN 8 SENSITIVE Sensitive     CEFEPIME <=0.12 SENSITIVE Sensitive     CEFTRIAXONE <=0.25 SENSITIVE Sensitive     CIPROFLOXACIN <=0.25 SENSITIVE Sensitive     GENTAMICIN <=1 SENSITIVE Sensitive     IMIPENEM 2 SENSITIVE Sensitive     NITROFURANTOIN 128 RESISTANT Resistant     TRIMETH/SULFA <=20 SENSITIVE Sensitive     AMPICILLIN/SULBACTAM <=2 SENSITIVE Sensitive     PIP/TAZO <=4 SENSITIVE Sensitive     * >=100,000 COLONIES/mL PROTEUS MIRABILIS  Surgical pcr screen     Status: None   Collection Time: 11/13/22  2:44 AM   Specimen: Nasal Mucosa; Nasal Swab  Result Value Ref Range Status   MRSA, PCR NEGATIVE NEGATIVE Final   Staphylococcus aureus NEGATIVE NEGATIVE Final    Comment: (NOTE) The Xpert SA Assay (FDA approved for NASAL specimens in patients 71 years of age and older), is one component of a comprehensive surveillance program. It is not intended to diagnose infection nor to guide or monitor treatment. Performed at [facility],  [place] [address], [place], [place] [address]          Radiology Studies: CT ABDOMEN PELVIS W CONTRAST  Result Date: 11/13/2022 CLINICAL DATA:  Acute abdominal pain following ERCP, initial  encounter EXAM: CT ABDOMEN AND PELVIS WITH CONTRAST TECHNIQUE: Multidetector CT imaging of the abdomen and pelvis was performed using the standard protocol following bolus administration of intravenous contrast. RADIATION DOSE REDUCTION: This exam was performed according to the departmental dose-optimization program which includes automated exposure control, adjustment of the mA and/or kV according to patient size and/or use of iterative reconstruction technique. CONTRAST:  53m OMNIPAQUE IOHEXOL 350 MG/ML SOLN COMPARISON:  ERCP from earlier in the same day, CT from 11/08/2022 FINDINGS: Lower chest: Bibasilar atelectasis is noted with small effusions new from the prior exam. Scattered calcified granulomas are seen. Hepatobiliary: Gallbladder is well distended with multiple dependent gallstones. A small focus of air is noted related to the recent ERCP. No biliary ductal dilatation is seen. The previously seen dilated common bile duct has resolved. No definitive filling defects are identified. The previously seen calcification in the head of the pancreas has resolved as well. Pancreas: Pancreas is within normal limits. Spleen: Normal in size without focal abnormality. Adrenals/Urinary Tract: Adrenal glands are within normal limits. Kidneys are well visualize within normal enhancement pattern. No renal calculi or obstructive changes are seen. Stable cysts are noted within the right kidney. No follow-up is recommended. The bladder is decompressed. Stomach/Bowel: Left-sided colostomy is noted. No Hartmann's pouch is noted consistent with prior low anterior resection. The appendix is within normal limits. Small bowel and stomach are unremarkable. Vascular/Lymphatic: Aortic atherosclerosis. No enlarged abdominal or pelvic lymph  nodes. Reproductive: Prostate is unremarkable. Other: No abdominal wall hernia or abnormality. No abdominopelvic ascites. Musculoskeletal: Chronic compression deformities of L4 and L5 are noted. IMPRESSION: New bilateral pleural effusions with bibasilar consolidation/atelectasis. No findings to suggest acute complication from the recent ERCP and sphincterotomy. Previously seen common bile duct densities have resolved. Cholelithiasis without complicating factors. No new focal abnormality is noted. Electronically Signed   By: MInez CatalinaM.D.   On: 11/13/2022 21:27   DG ERCP  Result Date: 11/13/2022 CLINICAL DATA:  77year old male with history of choledocholithiasis and undergoing ERCP EXAM: ERCP TECHNIQUE: Multiple spot images obtained with the fluoroscopic device and submitted for interpretation post-procedure. FLUOROSCOPY TIME:  190.99 mGy COMPARISON:  CT abdomen pelvis from 11/08/2022 FINDINGS: There is retrograde cannulation of the common bile duct. Retrograde cholangiogram demonstrates moderate extrahepatic biliary ductal dilation with filling defects in the distal common bile duct compatible with choledocholithiasis. Balloon sweep was performed. IMPRESSION: Obstructive distal choledocholithiasis. Balloon sweep was performed with apparent clearance of choledocholithiasis. These images were submitted for radiologic interpretation only. Please see the procedural report for the full procedural details, amount of contrast, and the fluoroscopy time utilized. DRuthann Cancer MD [facility] Electronically Signed   By: DRuthann CancerM.D.   On: 11/13/2022 19:57   DG CHEST PORT 1 VIEW  Result Date: 11/13/2022 CLINICAL DATA:  Dyspnea EXAM: PORTABLE CHEST 1 VIEW COMPARISON:  11/12/2022 FINDINGS: Stable cardiomediastinal contours. Progressive perihilar and bibasilar interstitial opacities. Possible small bilateral pleural effusions. No pneumothorax.  IMPRESSION: Progressive perihilar and bibasilar interstitial opacities. Electronically Signed   By: NDavina PokeD.O.   On: 11/13/2022 16:16        Scheduled Meds:  benzonatate  100 mg Oral BID   guaiFENesin  600 mg Oral BID   indomethacin  100 mg Rectal Once   pantoprazole (PROTONIX) IV  40 mg Intravenous Q12H   Continuous Infusions:  azithromycin 500 mg (11/13/22 1742)   lactated ringers 50 mL/hr at 11/14/22 1150   meropenem (MERREM) IV  1 g (11/14/22 0641)     LOS: 5 days    Time spent: 35 minutes.     Elmarie Shiley, MD [facility]   If 7PM-7AM, please contact night-coverage [email or web address]  11/14/2022, 1:10 PM

## 2022-11-14 NOTE — Progress Notes (Signed)
HISTORY OF PRESENT ILLNESS:  Stephen [name] is a 77 y.o. male with multiple medical problems who was admitted to the hospital with abdominal pain and jaundice.  Found to have gallstones, dilated bile duct, and common duct stones on imaging.  Underwent ERCP yesterday.  Sphincterotomy.  Balloon extraction of minimal amounts of sludge only.  Post procedure complained of worsening right upper quadrant pain that he had preprocedure.  Stat amylase and lipase post procedure were normal.  CT scan of the abdomen pelvis revealed no evidence for complications of ERCP.  The bile duct was decompressed.  No common duct stones.  Gallbladder was dilated. He has continued fluid.  Quadrant pain.  He continues on antibiotics.  Transaminases are normal at 36 as is alkaline phosphatase of 93.  Total bili of 2.8.  White blood cell count 16.4.  Surgical evaluation from earlier today noted.  Rectal quadrant pain consistent with cholecystitis.  Plans for urgent percutaneous cholecystostomy tube for acute cholecystitis.  Patient with surgical risk.  REVIEW OF SYSTEMS:  All non-GI ROS otherwise negative.  Past Medical History:  Diagnosis Date   Allergic rhinitis    Blood clot in vein 2015   left leg    History of anemia    History of chicken pox    HTN (hypertension)    Neuropathy    arms and legs - S/P Cancer tx   Rectal cancer metastasized to lung [facility]) 09-2013   surgery and chemo   Smoker     Past Surgical History:  Procedure Laterality Date   COLON RESECTION  2015   COLONOSCOPY  2015   ENDOSCOPIC RETROGRADE CHOLANGIOPANCREATOGRAPHY (ERCP) WITH PROPOFOL N/A 11/10/2022   Procedure: ENDOSCOPIC RETROGRADE CHOLANGIOPANCREATOGRAPHY (ERCP) WITH PROPOFOL;  Surgeon: Irene Shipper, MD;  Location: [place];  Service: Gastroenterology;  Laterality: N/A;   EUS N/A 11/15/2013   Procedure: LOWER ENDOSCOPIC ULTRASOUND (EUS);  Surgeon: Milus Banister, MD;  Location: [facility];  Service: Endoscopy;  Laterality:  N/A;   LUNG BIOPSY     LUNG LOBECTOMY Left    lung removed  2015   left lower    PLANTAR FASCIA RELEASE Right 09/20/2017   Procedure: PLANTAR FASCIA [id number], excision plantar fibroma right foot;  Surgeon: Samara Deist, DPM;  Location: [place];  Service: Podiatry;  Laterality: Right;   TONSILLECTOMY AND ADENOIDECTOMY  Childhood   VARICOSE VEIN SURGERY  2010    Social History Stephen [name]  reports that he quit smoking about 2 years ago. His smoking use included cigarettes. He has a 7.50 pack-year smoking history. He has never used smokeless tobacco. He reports current alcohol use of about 18.0 standard drinks of alcohol per week. He reports that he does not use drugs.  family history includes COPD in his brother; Cancer in his mother and paternal grandmother; Cancer (age of onset: 56) in his father; Coronary artery disease in his mother; Hypertension in his mother.  No Known Allergies     PHYSICAL EXAMINATION: Vital signs: BP 108/60 (BP Location: Left Arm)   Pulse 78   Temp 98.6 F (37 C) (Oral)   Resp 17   Ht '5\' 7"'$  (1.702 m)   Wt 93 kg   SpO2 96%   BMI 32.11 kg/m   Constitutional: Somewhat uncomfortable appearing, no acute distress however Psychiatric: alert and oriented x3, cooperative Eyes: extraocular movements intact, anicteric, conjunctiva pink Mouth: oral pharynx moist, no lesions Neck: supple no lymphadenopathy Cardiovascular: heart regular rate and rhythm, no murmur Lungs: clear to  auscultation bilaterally Abdomen: soft, fullness and tenderness in the right upper quadrant with guarding, no obvious ascites, unremarkable ostomy, normal bowel sounds, no organomegaly Rectal: Omitted Extremities: no clubbing or cyanosis lower extremity edema bilaterally. Skin: no lesions on visible extremities save ecchymoses Neuro: No focal deficits.   ASSESSMENT:  1.  Choledocholithiasis.  Status post successful ERCP with sphincterotomy and common duct stone  extraction 2.  Esophageal stricturing status post dilation 3.  Acute cholecystitis.  Being followed by surgery 4.  General medical problems    PLAN:  1.  Continue antibiotics 2.  Agree with surgical recommendation for percutaneous cholecystostomy tube placement 3.  Recommend pantoprazole 40 mg daily for erosive esophagitis and esophageal stricturing 4.  No further recommendations from GI standpoint.  Available if needed.  Will sign off.  Discussed with patient.  Thank you

## 2022-11-14 NOTE — Progress Notes (Signed)
Patient ID: Stephen [name], male   DOB: August 17, 1946, 77 y.o.   MRN: [id number]   Imaging, labs, and notes reviewed for possible cholecystostomy placement.   ERCP from 12/31 shows reflux of contrast into the GB confirming cystic duct is patent.  CBD also patent after balloon sweep.  CT A/P from 12/31 shows the GB is LESS distended than CT of 12/26 and no CT signs of acute cholecystitis. Air in the [employer] is from recent ERCP.    RUQ [place] 11/14/22(today) shows gallstones without GB distention, wall 60m, but no sonographic murphy's sign or pericholecystic fld.  Labs: LFTs all improving.  Upon review, Imaging and labs are not consistent with acute cholecystitis.  Rec holding on cholecystostomy.  If symptoms dont improve, then proceed with HIDA scan next before committing him to long term cholecystostomy management.

## 2022-11-15 ENCOUNTER — Encounter (HOSPITAL_COMMUNITY): Payer: Self-pay | Admitting: Internal Medicine

## 2022-11-15 DIAGNOSIS — K810 Acute cholecystitis: Secondary | ICD-10-CM | POA: Diagnosis not present

## 2022-11-15 LAB — CBC
HCT: 32.7 % — ABNORMAL LOW (ref 39.0–52.0)
Hemoglobin: 11.1 g/dL — ABNORMAL LOW (ref 13.0–17.0)
MCH: 34.6 pg — ABNORMAL HIGH (ref 26.0–34.0)
MCHC: 33.9 g/dL (ref 30.0–36.0)
MCV: 101.9 fL — ABNORMAL HIGH (ref 80.0–100.0)
Platelets: 143 10*3/uL — ABNORMAL LOW (ref 150–400)
RBC: 3.21 MIL/uL — ABNORMAL LOW (ref 4.22–5.81)
RDW: 15.6 % — ABNORMAL HIGH (ref 11.5–15.5)
WBC: 11.3 10*3/uL — ABNORMAL HIGH (ref 4.0–10.5)
nRBC: 0 % (ref 0.0–0.2)

## 2022-11-15 LAB — COMPREHENSIVE METABOLIC PANEL
ALT: 35 U/L (ref 0–44)
AST: 41 U/L (ref 15–41)
Albumin: 2.3 g/dL — ABNORMAL LOW (ref 3.5–5.0)
Alkaline Phosphatase: 90 U/L (ref 38–126)
Anion gap: 11 (ref 5–15)
BUN: 7 mg/dL — ABNORMAL LOW (ref 8–23)
CO2: 27 mmol/L (ref 22–32)
Calcium: 8.2 mg/dL — ABNORMAL LOW (ref 8.9–10.3)
Chloride: 98 mmol/L (ref 98–111)
Creatinine, Ser: 0.91 mg/dL (ref 0.61–1.24)
GFR, Estimated: >60 mL/min (ref >60)
Glucose, Bld: 82 mg/dL (ref 70–99)
Potassium: 3.5 mmol/L (ref 3.5–5.1)
Sodium: 136 mmol/L (ref 135–145)
Total Bilirubin: 2.4 mg/dL — ABNORMAL HIGH (ref 0.3–1.2)
Total Protein: 5.8 g/dL — ABNORMAL LOW (ref 6.5–8.1)

## 2022-11-15 LAB — LIPASE, BLOOD: Lipase: 39 U/L (ref 11–51)

## 2022-11-15 NOTE — Social Work (Signed)
TOC continues to follow for any needs or recommendations. TOC is available to assist with discharge planning. Will continue to follow.

## 2022-11-15 NOTE — Progress Notes (Addendum)
PROGRESS NOTE    Stephen [name]  QAS:[id number] DOB: 08/21/1946 DOA: 11/09/2022 PCP: [name], MD   Brief Narrative: 77 year old with past medical history significant for hypertension and metastatic rectal cancer 2014 with Foley and colostomy who presented to [facility] with abdominal pain, sepsis.  He developed acute abdominal pain and vomiting.  Started 2 or 3 days prior to admission.  Of note he has distal finger > toe pulp erythema with interosseous muscle atrophy.  He also has had progressive weakness over time and is now predominantly nonambulatory.  He presented with lactic acid 3.5>>> 5.6, leukocytosis 19 K, sepsis source of infection choledocholithiasis cholangitis, AST 167 ALT 55 bili 3.7.  Underwent ERCP 12/28, but procedure was aborted due to concern for CAP induce esophageal laceration. . CT chest showed: a contained tear in the esophagus.  12/31: Repeated ERCP with  endoscopy with esophageal dilation, ERCP sphincterotomy and biliary sludge  extraction.  Plan for HIDA scan.   Assessment & Plan:   Active Problems:   HTN (hypertension)   Rectal cancer (HCC)   Choledocholithiasis with acute cholecystitis with obstruction   Lung nodule, solitary   Chronic diastolic CHF (congestive heart failure) (HCC)   Alcohol abuse   DNR (do not resuscitate)   Choledocholithiasis   Esophageal stricture   1-Choledocholithiasis with cholangitis: -Presents with transaminases and abdominal pain.  -CT abdomen pelvis: positive for obstructive choledocholithiasis with common bile duct measuring up to 1 cm in diameter. No intrahepatic biliary ductal dilatation is noted at this time.  Cholelithiasis without evidence of acute cholecystitis. -Underwent ERCP 12/28, but procedure was aborted due to concern for CAP induce tear.  -PRN Zofran for nausea, IV Protonix.  -WBC increased , afebrile, antibiotics change to meropenem.  -Underwent /Endoscopy ERCP 12/31: endoscopy with esophageal dilation,  ERCP sphincterotomy and biliary sludge  extraction. -Post ERCP in PCAU complained of worsening abdominal pain. GI order STAT CT abdomen pelvis. Which was negative for complications.  -Surgery recommend cholecystostomy tube placement by IR. IR consulted. Dr Annamaria Boots  with IR, recommend to hold on cholecystostomy tube for now because  [place] reassuring, ERCP patent cystic duct, gallbladder less distended on CT scan from 12/31. Recommend HIDA scan to really evaluate cholecystitis.    2-PNA, BL pleural effusion:  Acute Hypoxic Respiratory Failure:  Cough; BL wheezing;  Repeated chest x ray: Continued interstitial changes in the lung.  Started guaifenesin.  Incentive spirometry.  CT abdomen; showed Bilateral effusion, consolidation.  WBC trending down. Stable on 4 L oxygen, wean as tolerated.   CT showed two contained esophageal tear.  No further recommendation per GI.   Lung nodule: Needs Out patient follow up/   Chronic Diastolic heart failure: Compensated.  Monitor on IV fluids.   Hypertension: PRN Hydralazine.   Remote history of metastatic rectal cancer Colostomy with poor out put.  KUB; Global dilation of small bowel loops. Findings could reflect developing small bowel obstruction or ileus. Attention on follow-up. Colostomy out put increased.   Alcohol abuse: Drinks 2-3 drinks per day.  Monitor for withdrawal.   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   Mild Hyponatremia; on IV fluids.  UTI; urine grew E coli and proteus. Sensitive to zosyn and meropenem.   Pressure Injury 03/05/21 Coccyx Medial Stage 2 -  Partial thickness loss of dermis presenting as a shallow open injury with a red, pink wound bed without slough. stage 2 pressure injury surrounded by DTI on coxxyx/sacrum (Active)  03/05/21 1639  Location: Coccyx  Location Orientation: Medial  Staging: Stage 2 -  Partial thickness loss of dermis presenting  as a shallow open injury with a red, pink wound bed without slough.  Wound Description (Comments): stage 2 pressure injury surrounded by DTI on coxxyx/sacrum  Present on Admission: Yes      Estimated body mass index is 32.11 kg/m as calculated from the following:   Height as of this encounter: '5\' 7"'$  (1.702 m).   Weight as of this encounter: 93 kg.   DVT prophylaxis: SCD Code Status: DNR Family Communication: wife at bedside 1/02 Disposition Plan:  Status is: Inpatient Remains inpatient appropriate because: management of cholangitis.     Consultants:  GI General Sx  Procedures:  ERCP  Antimicrobials:    Subjective: He is breathing better. Cough improved.  Still having RUQ abdominal pain   Objective: Vitals:   11/14/22 1725 11/14/22 2020 11/15/22 0545 11/15/22 0839  BP: 105/66 114/69 122/74 133/80  Pulse: 75 72 77 78  Resp: '17 18 18 18  '$ Temp: 97.8 F (36.6 C) 98 F (36.7 C) 97.7 F (36.5 C) 98.4 F (36.9 C)  TempSrc: Oral Oral Oral Oral  SpO2: 97% 98% 96% 94%  Weight:      Height:        Intake/Output Summary (Last 24 hours) at 11/15/2022 1401 Last data filed at 11/15/2022 0245 Gross per 24 hour  Intake 1389.54 ml  Output 2300 ml  Net -910.46 ml    Filed Weights   11/13/22 1036  Weight: 93 kg    Examination:  General exam: NAD Respiratory system: BL air movement.  Cardiovascular system: S 1, S 2 RRR Gastrointestinal system: BS present, soft, mild tender. colostomy bag.  Central nervous system: Alert.  Extremities: no edema   Data Reviewed: I have personally reviewed following labs and imaging studies  CBC: Recent Labs  Lab 11/09/22 0423 11/10/22 0209 11/11/22 0718 11/12/22 0140 11/13/22 0152 11/14/22 0606 11/15/22 0317  WBC 19.0*   < > 13.2* 17.8* 17.6* 16.4* 11.3*  NEUTROABS 17.4*  --   --   --  14.5*  --   --   HGB 15.7   < > 14.2 13.0 13.3 11.0* 11.1*  HCT 45.7   < > 40.1 36.6* 39.2 31.7* 32.7*  MCV 100.4*   < > 100.5* 100.0  103.2* 101.3* 101.9*  PLT 144*   < > 101* 97* 96* 131* 143*   < > = values in this interval not displayed.    Basic Metabolic Panel: Recent Labs  Lab 11/11/22 0718 11/12/22 0140 11/13/22 0152 11/14/22 0606 11/15/22 0317  NA 135 137 136 133* 136  K 3.8 3.4* 4.6 4.0 3.5  CL 105 104 101 97* 98  CO2 21* '23 28 25 27  '$ GLUCOSE 121* 113* 117* 132* 82  BUN '21 16 11 10 '$ 7*  CREATININE 1.43* 1.18 1.24 1.13 0.91  CALCIUM 8.6* 8.3* 8.4* 8.1* 8.2*    GFR: Estimated Creatinine Clearance: 76.3 mL/min (by C-G formula based on SCr of 0.91 mg/dL). Liver Function Tests: Recent Labs  Lab 11/11/22 0718 11/12/22 0140 11/13/22 0152 11/14/22 0606 11/15/22 0317  AST 74* 63* 46* 36 41  ALT 61* 49* 44 36 35  ALKPHOS 90 88 116 93 90  BILITOT 6.4* 4.5* 4.0* 2.8* 2.4*  PROT 6.8 5.9* 6.2* 5.9* 5.8*  ALBUMIN 2.5* 2.2* 2.3* 2.3* 2.3*    Recent Labs  Lab 11/09/22 0423 11/13/22 1330 11/15/22 0317  LIPASE 30  51 39  AMYLASE  --  46  --     No results for input(s): "AMMONIA" in the last 168 hours. Coagulation Profile: Recent Labs  Lab 11/09/22 1620 11/13/22 0152  INR 1.5* 1.3*    Cardiac Enzymes: No results for input(s): "CKTOTAL", "CKMB", "CKMBINDEX", "TROPONINI" in the last 168 hours. BNP (last 3 results) No results for input(s): "PROBNP" in the last 8760 hours. HbA1C: No results for input(s): "HGBA1C" in the last 72 hours. CBG: Recent Labs  Lab 11/13/22 0101  GLUCAP 116*    Lipid Profile: No results for input(s): "CHOL", "HDL", "LDLCALC", "TRIG", "CHOLHDL", "LDLDIRECT" in the last 72 hours. Thyroid Function Tests: No results for input(s): "TSH", "T4TOTAL", "FREET4", "T3FREE", "THYROIDAB" in the last 72 hours. Anemia Panel: Recent Labs    11/13/22 0152 11/13/22 0830  VITAMINB12  --  2,733*  FOLATE 8.6  --     Sepsis Labs: Recent Labs  Lab 11/08/22 1811  LATICACIDVEN 1.4     Recent Results (from the past 240 hour(s))  Culture, blood (routine x 2)     Status:  None   Collection Time: 11/08/22  4:47 AM   Specimen: BLOOD  Result Value Ref Range Status   Specimen Description BLOOD RIGHT ANTECUBITAL  Final   Special Requests   Final    BOTTLES DRAWN AEROBIC AND ANAEROBIC Blood Culture adequate volume   Culture   Final    NO GROWTH 5 DAYS Performed at [facility], [address], [place], [place] [address]    Report Status 11/13/2022 FINAL  Final  Resp panel by RT-PCR (RSV, Flu A&B, Covid) Anterior Nasal Swab     Status: None   Collection Time: 11/08/22  4:47 AM   Specimen: Anterior Nasal Swab  Result Value Ref Range Status   SARS Coronavirus 2 by RT PCR NEGATIVE NEGATIVE Final    Comment: (NOTE) SARS-CoV-2 target nucleic acids are NOT DETECTED.  The SARS-CoV-2 RNA is generally detectable in upper respiratory specimens during the acute phase of infection. The lowest concentration of SARS-CoV-2 viral copies this assay can detect is 138 copies/mL. A negative result does not preclude SARS-Cov-2 infection and should not be used as the sole basis for treatment or other patient management decisions. A negative result may occur with  improper specimen collection/handling, submission of specimen other than nasopharyngeal swab, presence of viral mutation(s) within the areas targeted by this assay, and inadequate number of viral copies(<138 copies/mL). A negative result must be combined with clinical observations, patient history, and epidemiological information. The expected result is Negative.  Fact Sheet for Patients:  EntrepreneurPulse.com.au  Fact Sheet for Healthcare Providers:  IncredibleEmployment.be  This test is no t yet approved or cleared by the [place] FDA and  has been authorized for detection and/or diagnosis of SARS-CoV-2 by FDA under an Emergency Use Authorization (EUA). This EUA will remain  in effect (meaning this test can be used) for the duration of the COVID-19  declaration under Section 564(b)(1) of the Act, 21 U.S.C.section 360bbb-3(b)(1), unless the authorization is terminated  or revoked sooner.       Influenza A by PCR NEGATIVE NEGATIVE Final   Influenza B by PCR NEGATIVE NEGATIVE Final    Comment: (NOTE) The Xpert Xpress SARS-CoV-2/FLU/RSV plus assay is intended as an aid in the diagnosis of influenza from Nasopharyngeal swab specimens and should not be used as a sole basis for treatment. Nasal washings and aspirates are unacceptable for Xpert Xpress SARS-CoV-2/FLU/RSV testing.  Fact Sheet for  Patients: EntrepreneurPulse.com.au  Fact Sheet for Healthcare Providers: IncredibleEmployment.be  This test is not yet approved or cleared by the [place] FDA and has been authorized for detection and/or diagnosis of SARS-CoV-2 by FDA under an Emergency Use Authorization (EUA). This EUA will remain in effect (meaning this test can be used) for the duration of the COVID-19 declaration under Section 564(b)(1) of the Act, 21 U.S.C. section 360bbb-3(b)(1), unless the authorization is terminated or revoked.     Resp Syncytial Virus by PCR NEGATIVE NEGATIVE Final    Comment: (NOTE) Fact Sheet for Patients: EntrepreneurPulse.com.au  Fact Sheet for Healthcare Providers: IncredibleEmployment.be  This test is not yet approved or cleared by the [place] FDA and has been authorized for detection and/or diagnosis of SARS-CoV-2 by FDA under an Emergency Use Authorization (EUA). This EUA will remain in effect (meaning this test can be used) for the duration of the COVID-19 declaration under Section 564(b)(1) of the Act, 21 U.S.C. section 360bbb-3(b)(1), unless the authorization is terminated or revoked.  Performed at [facility], [address] [place], [place] [address]   Urine Culture     Status: Abnormal   Collection Time: 11/08/22  4:47 AM    Specimen: Urine, Catheterized  Result Value Ref Range Status   Specimen Description   Final    URINE, CATHETERIZED Performed at [facility], [address] [place], [place] [address]    Special Requests   Final    NONE Performed at [facility], [address] [place], [place] [address]    Culture (A)  Final    >=100,000 COLONIES/mL PROTEUS MIRABILIS >=100,000 COLONIES/mL ESCHERICHIA COLI    Report Status 11/12/2022 FINAL  Final   Organism ID, Bacteria PROTEUS MIRABILIS (A)  Final   Organism ID, Bacteria ESCHERICHIA COLI (A)  Final      Susceptibility   Escherichia coli - MIC*    AMPICILLIN >=32 RESISTANT Resistant     CEFAZOLIN <=4 SENSITIVE Sensitive     CEFEPIME <=0.12 SENSITIVE Sensitive     CEFTRIAXONE <=0.25 SENSITIVE Sensitive     CIPROFLOXACIN <=0.25 SENSITIVE Sensitive     GENTAMICIN <=1 SENSITIVE Sensitive     IMIPENEM <=0.25 SENSITIVE Sensitive     NITROFURANTOIN <=16 SENSITIVE Sensitive     TRIMETH/SULFA >=320 RESISTANT Resistant     AMPICILLIN/SULBACTAM 16 INTERMEDIATE Intermediate     PIP/TAZO <=4 SENSITIVE Sensitive     * >=100,000 COLONIES/mL ESCHERICHIA COLI   Proteus mirabilis - MIC*    AMPICILLIN <=2 SENSITIVE Sensitive     CEFAZOLIN 8 SENSITIVE Sensitive     CEFEPIME <=0.12 SENSITIVE Sensitive     CEFTRIAXONE <=0.25 SENSITIVE Sensitive     CIPROFLOXACIN <=0.25 SENSITIVE Sensitive     GENTAMICIN <=1 SENSITIVE Sensitive     IMIPENEM 2 SENSITIVE Sensitive     NITROFURANTOIN 128 RESISTANT Resistant     TRIMETH/SULFA <=20 SENSITIVE Sensitive     AMPICILLIN/SULBACTAM <=2 SENSITIVE Sensitive     PIP/TAZO <=4 SENSITIVE Sensitive     * >=100,000 COLONIES/mL PROTEUS MIRABILIS  Surgical pcr screen     Status: None   Collection Time: 11/13/22  2:44 AM   Specimen: Nasal Mucosa; Nasal Swab  Result Value Ref Range Status   MRSA, PCR NEGATIVE NEGATIVE Final   Staphylococcus aureus NEGATIVE NEGATIVE Final    Comment: (NOTE) The Xpert SA  Assay (FDA approved for NASAL specimens in patients 14 years of age and older), is one component of a comprehensive surveillance program. It is  not intended to diagnose infection nor to guide or monitor treatment. Performed at [facility], [place] [address], [place], [place] [address]          Radiology Studies: US Abdomen Limited RUQ (LIVER/GB)  Result Date: 11/14/2022 CLINICAL DATA:  Abdominal pain. EXAM: ULTRASOUND ABDOMEN LIMITED RIGHT UPPER QUADRANT COMPARISON:  CT scan of the abdomen and pelvis November 13, 2022 FINDINGS: Gallbladder: Persistent cholelithiasis. The gallbladder wall measures 5 mm in thickness. No Murphy's sign or pericholecystic fluid. Common bile duct: Diameter: 3.3 mm Liver: Diffuse increased echogenicity throughout the liver. No mass. Portal vein is patent on color Doppler imaging with normal direction of blood flow towards the liver. Other: None. IMPRESSION: 1. Persistent cholelithiasis. The gallbladder wall is thickened to 5 mm. No Murphy's sign or pericholecystic fluid. If there is clinical concern for acute cholecystitis, recommend a HIDA scan. 2. Diffuse increased echogenicity throughout the liver consistent with hepatic steatosis identified on the comparison CT of the abdomen and pelvis. Electronically Signed   By: Dorise Bullion III M.D.   On: 11/14/2022 13:18   CT ABDOMEN PELVIS W CONTRAST  Result Date: 11/13/2022 CLINICAL DATA:  Acute abdominal pain following ERCP, initial encounter EXAM: CT ABDOMEN AND PELVIS WITH CONTRAST TECHNIQUE: Multidetector CT imaging of the abdomen and pelvis was performed using the standard protocol following bolus administration of intravenous contrast. RADIATION DOSE REDUCTION: This exam was performed according to the departmental dose-optimization program which includes automated exposure control, adjustment of the mA and/or kV according to patient size and/or use of iterative reconstruction technique. CONTRAST:  53m OMNIPAQUE  IOHEXOL 350 MG/ML SOLN COMPARISON:  ERCP from earlier in the same day, CT from 11/08/2022 FINDINGS: Lower chest: Bibasilar atelectasis is noted with small effusions new from the prior exam. Scattered calcified granulomas are seen. Hepatobiliary: Gallbladder is well distended with multiple dependent gallstones. A small focus of air is noted related to the recent ERCP. No biliary ductal dilatation is seen. The previously seen dilated common bile duct has resolved. No definitive filling defects are identified. The previously seen calcification in the head of the pancreas has resolved as well. Pancreas: Pancreas is within normal limits. Spleen: Normal in size without focal abnormality. Adrenals/Urinary Tract: Adrenal glands are within normal limits. Kidneys are well visualize within normal enhancement pattern. No renal calculi or obstructive changes are seen. Stable cysts are noted within the right kidney. No follow-up is recommended. The bladder is decompressed. Stomach/Bowel: Left-sided colostomy is noted. No Hartmann's pouch is noted consistent with prior low anterior resection. The appendix is within normal limits. Small bowel and stomach are unremarkable. Vascular/Lymphatic: Aortic atherosclerosis. No enlarged abdominal or pelvic lymph nodes. Reproductive: Prostate is unremarkable. Other: No abdominal wall hernia or abnormality. No abdominopelvic ascites. Musculoskeletal: Chronic compression deformities of L4 and L5 are noted. IMPRESSION: New bilateral pleural effusions with bibasilar consolidation/atelectasis. No findings to suggest acute complication from the recent ERCP and sphincterotomy. Previously seen common bile duct densities have resolved. Cholelithiasis without complicating factors. No new focal abnormality is noted. Electronically Signed   By: MInez CatalinaM.D.   On: 11/13/2022 21:27   DG CHEST PORT 1 VIEW  Result Date: 11/13/2022 CLINICAL DATA:  Dyspnea EXAM: PORTABLE CHEST 1 VIEW COMPARISON:   11/12/2022 FINDINGS: Stable cardiomediastinal contours. Progressive perihilar and bibasilar interstitial opacities. Possible small bilateral pleural effusions. No pneumothorax. IMPRESSION: Progressive perihilar and bibasilar interstitial opacities. Electronically Signed   By: NDavina PokeD.O.   On: 11/13/2022 16:16  Scheduled Meds:  benzonatate  100 mg Oral BID   guaiFENesin  600 mg Oral BID   indomethacin  100 mg Rectal Once   pantoprazole (PROTONIX) IV  40 mg Intravenous Q12H   Continuous Infusions:  azithromycin 500 mg (11/14/22 1802)   lactated ringers 50 mL/hr at 11/14/22 1150   meropenem (MERREM) IV 1 g (11/15/22 1049)     LOS: 6 days    Time spent: 35 minutes.     Elmarie Shiley, MD [facility]   If 7PM-7AM, please contact night-coverage [email or web address]  11/15/2022, 2:01 PM

## 2022-11-15 NOTE — Progress Notes (Signed)
Pharmacy Antibiotic Note  Stephen [name] is a 77 y.o. male admitted on 11/08/2022 with abdominal pain, elevated LFTs secondary to choledocholithiasis with ascending cholangitis. ERCP 12/28 identified a high-grade peptic stricture and hiatal hernia, but subsequently aborted due to concern for CAP induced esophageal laceration. Previously on Zosyn, transitioned to Meropenem due to rising WBC. Pharmacy consulted to switch to Meropenem for intra-abdominal infection.   GI following, recommended to continue antibiotics for now. IR consulted for possible cholecystostomy placement but deferred to med management. Possible HIDA scan if symptoms do not improve over next 1-2 days.  Plan: Continue Meropenem 1g IV q8h.  Trend WBC, fever, renal function F/u cultures, clinical progress, levels as indicated De-escalate when able  Height: '5\' 7"'$  (170.2 cm) Weight: 93 kg (205 lb) IBW/kg (Calculated) : 66.1  Temp (24hrs), Avg:98.1 F (36.7 C), Min:97.7 F (36.5 C), Max:98.6 F (37 C)  Recent Labs  Lab 11/08/22 1200 11/08/22 1811 11/09/22 0423 11/11/22 0718 11/12/22 0140 11/13/22 0152 11/14/22 0606 11/15/22 0317  WBC  --   --    < > 13.2* 17.8* 17.6* 16.4* 11.3*  CREATININE  --   --    < > 1.43* 1.18 1.24 1.13 0.91  LATICACIDVEN 3.6* 1.4  --   --   --   --   --   --    < > = values in this interval not displayed.    Estimated Creatinine Clearance: 76.3 mL/min (by C-G formula based on SCr of 0.91 mg/dL).    No Known Allergies  Antimicrobials this admission: Metronidazole 12/26 x 1 Vancomycin 12/26 x 1 Cefepime 12/26 >> 12/27 Zosyn 12/27 >> 12/30 Meropenem 12/30 >>   Microbiology results: 12/26 BCx: NGTD 12/26 UCx: >100K P.mirabilis, >100K E.coli     Thank you for allowing pharmacy to be a part of this patient's care.  Ardyth Harps, PharmD Clinical Pharmacist

## 2022-11-15 NOTE — Progress Notes (Addendum)
2 Days Post-Op   Subjective/Chief Complaint: Pt tells me he feels a little better this morning, but then later says his pain is about the same compared to when he got here, worse in RUQ. Denies flatus or BM. Denies nausea. Tolerating CLD. States he lives in [place] with his wife who is in good health. Expresses frustration with still being in the hospital.    Objective: Vital signs in last 24 hours: Temp:  [97.7 F (36.5 C)-98.6 F (37 C)] 97.7 F (36.5 C) (01/02 0545) Pulse Rate:  [72-78] 77 (01/02 0545) Resp:  [17-18] 18 (01/02 0545) BP: (105-122)/(60-74) 122/74 (01/02 0545) SpO2:  [96 %-98 %] 96 % (01/02 0545) Last BM Date : 11/14/22  Intake/Output from previous day: 01/01 0701 - 01/02 0700 In: 1389.5 [I.V.:639.5; IV Piggyback:750] Out: 2300 [Urine:2300] Intake/Output this shift: No intake/output data recorded.  Gen: alert, cooperative NAD Abd: soft, some distention, significantly TTP RUQ with some guarding, LLQ ostomy in place.   Lab Results:  Recent Labs    11/14/22 0606 11/15/22 0317  WBC 16.4* 11.3*  HGB 11.0* 11.1*  HCT 31.7* 32.7*  PLT 131* 143*   BMET Recent Labs    11/14/22 0606 11/15/22 0317  NA 133* 136  K 4.0 3.5  CL 97* 98  CO2 25 27  GLUCOSE 132* 82  BUN 10 7*  CREATININE 1.13 0.91  CALCIUM 8.1* 8.2*   PT/INR Recent Labs    11/13/22 0152  LABPROT 16.3*  INR 1.3*   ABG No results for input(s): "PHART", "HCO3" in the last 72 hours.  Invalid input(s): "PCO2", "PO2"  Studies/Results: US Abdomen Limited RUQ (LIVER/GB)  Result Date: 11/14/2022 CLINICAL DATA:  Abdominal pain. EXAM: ULTRASOUND ABDOMEN LIMITED RIGHT UPPER QUADRANT COMPARISON:  CT scan of the abdomen and pelvis November 13, 2022 FINDINGS: Gallbladder: Persistent cholelithiasis. The gallbladder wall measures 5 mm in thickness. No Murphy's sign or pericholecystic fluid. Common bile duct: Diameter: 3.3 mm Liver: Diffuse increased echogenicity throughout the liver. No mass. Portal  vein is patent on color Doppler imaging with normal direction of blood flow towards the liver. Other: None. IMPRESSION: 1. Persistent cholelithiasis. The gallbladder wall is thickened to 5 mm. No Murphy's sign or pericholecystic fluid. If there is clinical concern for acute cholecystitis, recommend a HIDA scan. 2. Diffuse increased echogenicity throughout the liver consistent with hepatic steatosis identified on the comparison CT of the abdomen and pelvis. Electronically Signed   By: Dorise Bullion III M.D.   On: 11/14/2022 13:18   CT ABDOMEN PELVIS W CONTRAST  Result Date: 11/13/2022 CLINICAL DATA:  Acute abdominal pain following ERCP, initial encounter EXAM: CT ABDOMEN AND PELVIS WITH CONTRAST TECHNIQUE: Multidetector CT imaging of the abdomen and pelvis was performed using the standard protocol following bolus administration of intravenous contrast. RADIATION DOSE REDUCTION: This exam was performed according to the departmental dose-optimization program which includes automated exposure control, adjustment of the mA and/or kV according to patient size and/or use of iterative reconstruction technique. CONTRAST:  55m OMNIPAQUE IOHEXOL 350 MG/ML SOLN COMPARISON:  ERCP from earlier in the same day, CT from 11/08/2022 FINDINGS: Lower chest: Bibasilar atelectasis is noted with small effusions new from the prior exam. Scattered calcified granulomas are seen. Hepatobiliary: Gallbladder is well distended with multiple dependent gallstones. A small focus of air is noted related to the recent ERCP. No biliary ductal dilatation is seen. The previously seen dilated common bile duct has resolved. No definitive filling defects are identified. The previously seen calcification in the  head of the pancreas has resolved as well. Pancreas: Pancreas is within normal limits. Spleen: Normal in size without focal abnormality. Adrenals/Urinary Tract: Adrenal glands are within normal limits. Kidneys are well visualize within normal  enhancement pattern. No renal calculi or obstructive changes are seen. Stable cysts are noted within the right kidney. No follow-up is recommended. The bladder is decompressed. Stomach/Bowel: Left-sided colostomy is noted. No Hartmann's pouch is noted consistent with prior low anterior resection. The appendix is within normal limits. Small bowel and stomach are unremarkable. Vascular/Lymphatic: Aortic atherosclerosis. No enlarged abdominal or pelvic lymph nodes. Reproductive: Prostate is unremarkable. Other: No abdominal wall hernia or abnormality. No abdominopelvic ascites. Musculoskeletal: Chronic compression deformities of L4 and L5 are noted. IMPRESSION: New bilateral pleural effusions with bibasilar consolidation/atelectasis. No findings to suggest acute complication from the recent ERCP and sphincterotomy. Previously seen common bile duct densities have resolved. Cholelithiasis without complicating factors. No new focal abnormality is noted. Electronically Signed   By: Inez Catalina M.D.   On: 11/13/2022 21:27   DG ERCP  Result Date: 11/13/2022 CLINICAL DATA:  77 year old male with history of choledocholithiasis and undergoing ERCP EXAM: ERCP TECHNIQUE: Multiple spot images obtained with the fluoroscopic device and submitted for interpretation post-procedure. FLUOROSCOPY TIME:  190.99 mGy COMPARISON:  CT abdomen pelvis from 11/08/2022 FINDINGS: There is retrograde cannulation of the common bile duct. Retrograde cholangiogram demonstrates moderate extrahepatic biliary ductal dilation with filling defects in the distal common bile duct compatible with choledocholithiasis. Balloon sweep was performed. IMPRESSION: Obstructive distal choledocholithiasis. Balloon sweep was performed with apparent clearance of choledocholithiasis. These images were submitted for radiologic interpretation only. Please see the procedural report for the full procedural details, amount of contrast, and the fluoroscopy time utilized.  Ruthann Cancer, MD [facility] Radiology Electronically Signed   By: Ruthann Cancer M.D.   On: 11/13/2022 19:57   DG CHEST PORT 1 VIEW  Result Date: 11/13/2022 CLINICAL DATA:  Dyspnea EXAM: PORTABLE CHEST 1 VIEW COMPARISON:  11/12/2022 FINDINGS: Stable cardiomediastinal contours. Progressive perihilar and bibasilar interstitial opacities. Possible small bilateral pleural effusions. No pneumothorax. IMPRESSION: Progressive perihilar and bibasilar interstitial opacities. Electronically Signed   By: Davina Poke D.O.   On: 11/13/2022 16:16    Anti-infectives: Anti-infectives (From admission, onward)    Start     Dose/Rate Route Frequency Ordered Stop   11/13/22 1730  azithromycin (ZITHROMAX) 500 mg in sodium chloride 0.9 % 250 mL IVPB        500 mg 250 mL/hr over 60 Minutes Intravenous Every 24 hours 11/13/22 1638 11/18/22 1729   11/12/22 1400  meropenem (MERREM) 1 g in sodium chloride 0.9 % 100 mL IVPB        1 g 200 mL/hr over 30 Minutes Intravenous Every 8 hours 11/12/22 1128     11/09/22 1200  piperacillin-tazobactam (ZOSYN) IVPB 3.375 g  Status:  Discontinued        3.375 g 12.5 mL/hr over 240 Minutes Intravenous Every 8 hours 11/09/22 0951 11/12/22 1058       Assessment/Plan: Choledocholithiasis  s/p Procedure(s): ENDOSCOPIC RETROGRADE CHOLANGIOPANCREATOGRAPHY (ERCP) (N/A) ESOPHAGOGASTRODUODENOSCOPY (EGD) (N/A) Balloon dilation wire-guided SPHINCTEROTOMY REMOVAL OF STONES  History of choledocholithiasis status post ERCP x 1 with attempted ERCP x 1 with injury to the esophagus.   According to surgeon who rounded yesterday, the patient had increased RUQ pain concerning for cholecystitis - Dr. Annamaria Boots reviewed RUQ U/S, CT A/P, as well as ERCP imaging and cystic duct appears patent, not consistent  with cholecystitis   This morning the patient has ongoing RUQ pain without nausea or vomiting. Afebrile. WBC down-trending, LFTs  stable/improving. Lipase pending. Advance to FLD and monitor. If his pain is not better then will plan for HIDA tomorrow AM to re-evaluate possible cholecystitis.  Make NPO after 0300 so that if he needs a HIDA It can hopefully happen quickly in AM.    LOS: 6 days    Jill Alexanders PA-C 11/15/2022 Total time 45 minutes

## 2022-11-15 NOTE — Plan of Care (Signed)

## 2022-11-15 NOTE — Progress Notes (Signed)
PT Cancellation Note  Patient Details Name: Stephen [name] MRN: [id number] DOB: 1946-09-01   Cancelled Treatment:    Reason Eval/Treat Not Completed: Pain limiting ability to participate;Other (comment) (Pt resting, just received pain medication, resting in bed states pain is awful and he will not participate in physical therapy stating that he is "a long way off". Pt encouraged to try to mobilize but declined. Pt needs alot of encouragement and has previously had physical therapy reporting he did LE exercises in the bed. Pt is scheduled for procedure tomorrow 1/3 to hopefully improve pain. If pt continues to decline mobilization consider discharge from skilled physical therapy services. Pt is total assist at home with spouse at this time though demonstrates physically the potential to improve significantly. Bed level exercises can be done by pt and family at home without skilled services.)   Ander Purpura 11/15/2022, 12:55 PM

## 2022-11-16 ENCOUNTER — Inpatient Hospital Stay (HOSPITAL_COMMUNITY): Payer: Medicare HMO

## 2022-11-16 DIAGNOSIS — K810 Acute cholecystitis: Secondary | ICD-10-CM | POA: Diagnosis not present

## 2022-11-16 LAB — COMPREHENSIVE METABOLIC PANEL
ALT: 37 U/L (ref 0–44)
AST: 44 U/L — ABNORMAL HIGH (ref 15–41)
Albumin: 2.1 g/dL — ABNORMAL LOW (ref 3.5–5.0)
Alkaline Phosphatase: 100 U/L (ref 38–126)
Anion gap: 9 (ref 5–15)
BUN: 5 mg/dL — ABNORMAL LOW (ref 8–23)
CO2: 29 mmol/L (ref 22–32)
Calcium: 8 mg/dL — ABNORMAL LOW (ref 8.9–10.3)
Chloride: 97 mmol/L — ABNORMAL LOW (ref 98–111)
Creatinine, Ser: 0.8 mg/dL (ref 0.61–1.24)
GFR, Estimated: >60 mL/min (ref >60)
Glucose, Bld: 108 mg/dL — ABNORMAL HIGH (ref 70–99)
Potassium: 3.5 mmol/L (ref 3.5–5.1)
Sodium: 135 mmol/L (ref 135–145)
Total Bilirubin: 2.1 mg/dL — ABNORMAL HIGH (ref 0.3–1.2)
Total Protein: 5.6 g/dL — ABNORMAL LOW (ref 6.5–8.1)

## 2022-11-16 LAB — CBC
HCT: 33 % — ABNORMAL LOW (ref 39.0–52.0)
Hemoglobin: 11.4 g/dL — ABNORMAL LOW (ref 13.0–17.0)
MCH: 35.2 pg — ABNORMAL HIGH (ref 26.0–34.0)
MCHC: 34.5 g/dL (ref 30.0–36.0)
MCV: 101.9 fL — ABNORMAL HIGH (ref 80.0–100.0)
Platelets: 151 10*3/uL (ref 150–400)
RBC: 3.24 MIL/uL — ABNORMAL LOW (ref 4.22–5.81)
RDW: 15.4 % (ref 11.5–15.5)
WBC: 10.8 10*3/uL — ABNORMAL HIGH (ref 4.0–10.5)
nRBC: 0 % (ref 0.0–0.2)

## 2022-11-16 MED ORDER — TECHNETIUM TC 99M MEBROFENIN IV KIT
7.6000 | PACK | Freq: Once | INTRAVENOUS | Status: AC | PRN
  Administered 2022-11-16: 7.6 via INTRAVENOUS

## 2022-11-16 NOTE — Progress Notes (Addendum)
3 Days Post-Op   Subjective/Chief Complaint: Still endorsing some right-sided abdominal pain.   Objective: Vital signs in last 24 hours: Temp:  [97.7 F (36.5 C)-98.4 F (36.9 C)] 98.4 F (36.9 C) (01/03 0828) Pulse Rate:  [77-96] 77 (01/03 0828) Resp:  [17] 17 (01/03 0828) BP: (105-141)/(62-95) 139/95 (01/03 0828) SpO2:  [95 %-97 %] 96 % (01/03 0828) Last BM Date : 11/14/22  Intake/Output from previous day: 01/02 0701 - 01/03 0700 In: -  Out: 1550 [Urine:1550] Intake/Output this shift: No intake/output data recorded.  Gen: alert, cooperative NAD Abd: soft, some distention, right upper quadrant tender to palpation now with evolving subcutaneous hematoma and ecchymosis spanning to the right flank-this was not present yesterday, LLQ ostomy in place.  CT from 12/31 does show an intramuscular hematoma in area of pain:  Lab Results:  Recent Labs    11/15/22 0317 11/16/22 0232  WBC 11.3* 10.8*  HGB 11.1* 11.4*  HCT 32.7* 33.0*  PLT 143* 151    BMET Recent Labs    11/15/22 0317 11/16/22 0232  NA 136 135  K 3.5 3.5  CL 98 97*  CO2 27 29  GLUCOSE 82 108*  BUN 7* 5*  CREATININE 0.91 0.80  CALCIUM 8.2* 8.0*    PT/INR No results for input(s): "LABPROT", "INR" in the last 72 hours.  ABG No results for input(s): "PHART", "HCO3" in the last 72 hours.  Invalid input(s): "PCO2", "PO2"  Studies/Results: US Abdomen Limited RUQ (LIVER/GB)  Result Date: 11/14/2022 CLINICAL DATA:  Abdominal pain. EXAM: ULTRASOUND ABDOMEN LIMITED RIGHT UPPER QUADRANT COMPARISON:  CT scan of the abdomen and pelvis November 13, 2022 FINDINGS: Gallbladder: Persistent cholelithiasis. The gallbladder wall measures 5 mm in thickness. No Murphy's sign or pericholecystic fluid. Common bile duct: Diameter: 3.3 mm Liver: Diffuse increased echogenicity throughout the liver. No mass. Portal vein is patent on color Doppler imaging with normal direction of blood flow towards the liver. Other: None.  IMPRESSION: 1. Persistent cholelithiasis. The gallbladder wall is thickened to 5 mm. No Murphy's sign or pericholecystic fluid. If there is clinical concern for acute cholecystitis, recommend a HIDA scan. 2. Diffuse increased echogenicity throughout the liver consistent with hepatic steatosis identified on the comparison CT of the abdomen and pelvis. Electronically Signed   By: Dorise Bullion III M.D.   On: 11/14/2022 13:18    Anti-infectives: Anti-infectives (From admission, onward)    Start     Dose/Rate Route Frequency Ordered Stop   11/13/22 1730  azithromycin (ZITHROMAX) 500 mg in sodium chloride 0.9 % 250 mL IVPB        500 mg 250 mL/hr over 60 Minutes Intravenous Every 24 hours 11/13/22 1638 11/18/22 1729   11/12/22 1400  meropenem (MERREM) 1 g in sodium chloride 0.9 % 100 mL IVPB        1 g 200 mL/hr over 30 Minutes Intravenous Every 8 hours 11/12/22 1128     11/09/22 1200  piperacillin-tazobactam (ZOSYN) IVPB 3.375 g  Status:  Discontinued        3.375 g 12.5 mL/hr over 240 Minutes Intravenous Every 8 hours 11/09/22 0951 11/12/22 1058       Assessment/Plan: Choledocholithiasis  s/p Procedure(s): ENDOSCOPIC RETROGRADE CHOLANGIOPANCREATOGRAPHY (ERCP) (N/A) ESOPHAGOGASTRODUODENOSCOPY (EGD) (N/A) Balloon dilation wire-guided SPHINCTEROTOMY REMOVAL OF STONES  History of choledocholithiasis status post ERCP x 1 with attempted ERCP x 1 with injury to the esophagus.   11/14/22: increased RUQ pain concerning for cholecystitis - Dr. Annamaria Boots reviewed RUQ U/S, CT A/P, as well  as ERCP imaging and cystic duct appears patent, not consistent with cholecystitis   This morning the patient has ongoing RUQ pain without nausea or vomiting. Afebrile. WBC down-trending, LFTs stable/improving. Lipase normal.  His RUQ pain may be related to the large flank hematoma and ecchymosis that has developed, unclear etiology and although it was not described in the report it was present on his CT scan 3 days  ago.    Although standard of care given history of choledocholithiasis would be to proceed with laparoscopic cholecystectomy, at least 2 of my senior partners have expressed reservations about this patient's safety for undergoing general anesthesia given his comorbidities and overall deconditioned status, as well as active pneumonia with bilateral pleural effusions, healing contained esophageal tear after initial ERCP attempt and I agree that for the time being it may be the safest approach to proceed with percutaneous cholecystostomy tube if cholecystitis is present. Echo fairly reassuring.  Will proceed with HIDA to evaluate for cholecystitis.    LOS: 7 days    Clovis Riley MD 11/16/2022 Total time 45 minutes

## 2022-11-16 NOTE — Progress Notes (Signed)
PT Cancellation Note  Patient Details Name: Stephen [name] MRN: [id number] DOB: November 26, 1945   Cancelled Treatment:    Reason Eval/Treat Not Completed: (P) Patient at procedure or test/unavailable (pt at [facility] for procedure.) Will continue efforts per PT plan of care as schedule permits.   Stephen [name] M [name] 11/16/2022, 11:10 AM

## 2022-11-16 NOTE — Plan of Care (Signed)

## 2022-11-16 NOTE — Progress Notes (Signed)
Interventional Radiology Brief Note:  IR consulted for possible percutaneous cholecystostomy tube placement in patient with ongoing complaint of RUQ pain.   Patient admitted 11/08/22 with possible cholecystitis.  As summarized by Dr. Annamaria Boots 11/15/21 ERCP from 12/31 showed cystic duct and common bile duct patent after balloon sweep. CT Abd Pelv from 12/31 showed improvement in gall bladder distention compared to prior imaging 12/26 with no additional CT findings to suggest acute cholecystitis. RUQ [place] positive for gall stones without GB distention or sonographic murphy sign.  HIDA was recommended as ongoing diagnostic work-up for RUQ continues.   HIDA completed today and reviewed by Dr. Pascal Lux.  It is also negative for obstructive cholecystitis. Although patient continues to complain of RUQ pain, there is no evidence to suggest acute cholecystitis that would warrant cholecystotomy tube placement at this time. Agree with Dr. Ron Parker suggestion that the inexplicable subcutaneous hematoma/bruising in the RUQ/flank may be contributing to the complaint of pain.  Otherwise he remains afebrile with stable vital signs and his WBC continues to improve (now 10.8).  No procedure planned in IR at this time.   Please re-consult if needed.    Brynda Greathouse, MS RD PA-C

## 2022-11-16 NOTE — Plan of Care (Signed)

## 2022-11-16 NOTE — Progress Notes (Signed)
PROGRESS NOTE  Stephen [name] BWG:[id number] DOB: Jun 15, 1946 DOA: 11/09/2022 PCP: [name], MD   LOS: 7 days   Brief Narrative / Interim history: 77 year old with past medical history significant for hypertension and metastatic rectal cancer 2014 with Foley and colostomy who presented to [facility] with abdominal pain, sepsis. He developed acute abdominal pain and vomiting. Started 2 or 3 days prior to admission.  He presented with lactic acidosis, significant leukocytosis with white count of 19 K, felt to have cholecystitis / choledocholithiasis.  GI and general surgery was consulted.  Subjective / 24h Interval events: Overall doing well this morning, still has some right upper quadrant pain but no nausea, no vomiting.  Assesement and Plan: Active Problems:   HTN (hypertension)   Rectal cancer (HCC)   Choledocholithiasis with acute cholecystitis with obstruction   Lung nodule, solitary   Chronic diastolic CHF (congestive heart failure) (HCC)   Alcohol abuse   DNR (do not resuscitate)   Choledocholithiasis   Esophageal stricture  Principal problem Choledocholithiasis, concern for cholangitis -patient presented with transaminitis, abdominal pain, as well as leukocytosis.  General surgery as well as gastroenterology were consulted.  CT abdomen and pelvis on admission showed obstructive choledocholithiasis with CBD up to 1 cm, no intrahepatic biliary ductal dilatation at this time.  He underwent an ERCP initially 12/28 but aborted due to concern for esophageal tear, and repeat ERCP on 12/31 was successful and underwent cingulotomy biliary sludge extraction.  General surgery following, he is not a candidate for cholecystectomy and may need a percutaneous cholecystostomy based on a HIDA scan which is pending today.  Active problems Concern for community acquired pneumonia, acute hypoxic respiratory failure, cough -based on the chest x-ray.  CT scan of the abdomen pelvis showed bilateral  effusions as well as concern for consolidation.  Has been placed on azithromycin in addition to his broad-spectrum antibiotics for #1.  Continue to monitor  Esophageal tear-based on the CT scan-no further recommendations per GI  Remote history of metastatic rectal cancer -has a colostomy in place  Alcohol use-2-3 drinks a day, no evidence of withdrawals  Lung nodule-outpatient follow-up  Mild hyponatremia-stable, now corrected  Thrombocytopenia-in the setting of acute infection, platelets have now normalized  Scheduled Meds:  benzonatate  100 mg Oral BID   guaiFENesin  600 mg Oral BID   pantoprazole (PROTONIX) IV  40 mg Intravenous Q12H   Continuous Infusions:  azithromycin 500 mg (11/15/22 1748)   lactated ringers 50 mL/hr at 11/15/22 2356   meropenem (MERREM) IV 1 g (11/16/22 1354)   PRN Meds:.acetaminophen **OR** acetaminophen, albuterol, hydrALAZINE, morphine injection, ondansetron **OR** ondansetron (ZOFRAN) IV, oxyCODONE  Current Outpatient Medications  Medication Instructions   esomeprazole (NEXIUM) 20 mg, Oral, Daily before breakfast   hydrOXYzine (ATARAX) 10 mg, Oral, 3 times daily PRN   MAGNESIUM PO 1 tablet, Oral, Daily   Multiple Vitamin (MULTIVITAMIN WITH MINERALS) TABS tablet 1 tablet, Oral, Daily   Omega-3 Fatty Acids (OMEGA 3 PO) 1 tablet, Oral, Daily   polyethylene glycol (MIRALAX / GLYCOLAX) 17 g, Oral, Daily PRN   vitamin B-12 (CYANOCOBALAMIN) 100 mcg, Oral, Daily   vitamin C (ASCORBIC ACID) 250 mg, Oral, Daily   Vitamin D (Cholecalciferol) 1,000 Units, Oral, Daily    Diet Orders (From admission, onward)     Start     Ordered   11/16/22 0300  Diet NPO time specified Except for: Sips with Meds, Ice Chips  Diet effective midnight       Question Answer  Comment  Except for Sips with Meds   Except for Ice Chips      11/15/22 0813            DVT prophylaxis: SCDs Start: 11/09/22 3536   Lab Results  Component Value Date   PLT 151 11/16/2022       Code Status: DNR  Family Communication: wife at bedside   Status is: Inpatient  Remains inpatient appropriate because: severity of illness  Level of care: Telemetry Medical  Consultants:  General surgery  GI  Objective: Vitals:   11/15/22 2100 11/16/22 0512 11/16/22 0828 11/16/22 1231  BP: 139/83 (!) 141/84 (!) 139/95 (!) 144/88  Pulse: 81 79 77 83  Resp:   17 16  Temp: 98 F (36.7 C) 97.7 F (36.5 C) 98.4 F (36.9 C) (!) 97.5 F (36.4 C)  TempSrc: Oral Oral Oral Oral  SpO2: 95% 96% 96% 94%  Weight:      Height:        Intake/Output Summary (Last 24 hours) at 11/16/2022 1400 Last data filed at 11/16/2022 0900 Gross per 24 hour  Intake 0 ml  Output 1550 ml  Net -1550 ml   Wt Readings from Last 3 Encounters:  11/13/22 93 kg  11/08/22 89.8 kg  12/03/21 84.8 kg    Examination:  Constitutional: NAD Eyes: no scleral icterus ENMT: Mucous membranes are moist.  Neck: normal, supple Respiratory: clear to auscultation bilaterally, no wheezing, no crackles. Normal respiratory effort. No accessory muscle use.  Cardiovascular: Regular rate and rhythm, no murmurs / rubs / gallops. No LE edema.  Abdomen: Right upper quadrant tenderness present, no guarding or rebound Musculoskeletal: no clubbing / cyanosis.    Data Reviewed: I have independently reviewed following labs and imaging studies   CBC Recent Labs  Lab 11/12/22 0140 11/13/22 0152 11/14/22 0606 11/15/22 0317 11/16/22 0232  WBC 17.8* 17.6* 16.4* 11.3* 10.8*  HGB 13.0 13.3 11.0* 11.1* 11.4*  HCT 36.6* 39.2 31.7* 32.7* 33.0*  PLT 97* 96* 131* 143* 151  MCV 100.0 103.2* 101.3* 101.9* 101.9*  MCH 35.5* 35.0* 35.1* 34.6* 35.2*  MCHC 35.5 33.9 34.7 33.9 34.5  RDW 15.1 15.5 15.7* 15.6* 15.4  LYMPHSABS  --  1.1  --   --   --   MONOABS  --  1.4*  --   --   --   EOSABS  --  0.2  --   --   --   BASOSABS  --  0.1  --   --   --     Recent Labs  Lab 11/09/22 1620 11/10/22 0209 11/12/22 0140  11/13/22 0152 11/14/22 0606 11/15/22 0317 11/16/22 0232  NA  --    < > 137 136 133* 136 135  K  --    < > 3.4* 4.6 4.0 3.5 3.5  CL  --    < > 104 101 97* 98 97*  CO2  --    < > '23 28 25 27 29  '$ GLUCOSE  --    < > 113* 117* 132* 82 108*  BUN  --    < > '16 11 10 '$ 7* 5*  CREATININE  --    < > 1.18 1.24 1.13 0.91 0.80  CALCIUM  --    < > 8.3* 8.4* 8.1* 8.2* 8.0*  AST  --    < > 63* 46* 36 41 44*  ALT  --    < > 49* 44 36 35 37  ALKPHOS  --    < >  88 116 93 90 100  BILITOT  --    < > 4.5* 4.0* 2.8* 2.4* 2.1*  ALBUMIN  --    < > 2.2* 2.3* 2.3* 2.3* 2.1*  INR 1.5*  --   --  1.3*  --   --   --    < > = values in this interval not displayed.    ------------------------------------------------------------------------------------------------------------------ No results for input(s): "CHOL", "HDL", "LDLCALC", "TRIG", "CHOLHDL", "LDLDIRECT" in the last 72 hours.  No results found for: "HGBA1C" ------------------------------------------------------------------------------------------------------------------ No results for input(s): "TSH", "T4TOTAL", "T3FREE", "THYROIDAB" in the last 72 hours.  Invalid input(s): "FREET3"  Cardiac Enzymes No results for input(s): "CKMB", "TROPONINI", "MYOGLOBIN" in the last 168 hours.  Invalid input(s): "CK" ------------------------------------------------------------------------------------------------------------------    Component Value Date/Time   BNP 92.8 03/05/2021 0810    CBG: Recent Labs  Lab 11/13/22 0101  GLUCAP 116*    Recent Results (from the past 240 hour(s))  Culture, blood (routine x 2)     Status: None   Collection Time: 11/08/22  4:47 AM   Specimen: BLOOD  Result Value Ref Range Status   Specimen Description BLOOD RIGHT ANTECUBITAL  Final   Special Requests   Final    BOTTLES DRAWN AEROBIC AND ANAEROBIC Blood Culture adequate volume   Culture   Final    NO GROWTH 5 DAYS Performed at [facility], [address], [place], [place] [address]    Report Status 11/13/2022 FINAL  Final  Resp panel by RT-PCR (RSV, Flu A&B, Covid) Anterior Nasal Swab     Status: None   Collection Time: 11/08/22  4:47 AM   Specimen: Anterior Nasal Swab  Result Value Ref Range Status   SARS Coronavirus 2 by RT PCR NEGATIVE NEGATIVE Final    Comment: (NOTE) SARS-CoV-2 target nucleic acids are NOT DETECTED.  The SARS-CoV-2 RNA is generally detectable in upper respiratory specimens during the acute phase of infection. The lowest concentration of SARS-CoV-2 viral copies this assay can detect is 138 copies/mL. A negative result does not preclude SARS-Cov-2 infection and should not be used as the sole basis for treatment or other patient management decisions. A negative result may occur with  improper specimen collection/handling, submission of specimen other than nasopharyngeal swab, presence of viral mutation(s) within the areas targeted by this assay, and inadequate number of viral copies(<138 copies/mL). A negative result must be combined with clinical observations, patient history, and epidemiological information. The expected result is Negative.  Fact Sheet for Patients:  EntrepreneurPulse.com.au  Fact Sheet for Healthcare Providers:  IncredibleEmployment.be  This test is no t yet approved or cleared by the [place] FDA and  has been authorized for detection and/or diagnosis of SARS-CoV-2 by FDA under an Emergency Use Authorization (EUA). This EUA will remain  in effect (meaning this test can be used) for the duration of the COVID-19 declaration under Section 564(b)(1) of the Act, 21 U.S.C.section 360bbb-3(b)(1), unless the authorization is terminated  or revoked sooner.       Influenza A by PCR NEGATIVE NEGATIVE Final   Influenza B by PCR NEGATIVE NEGATIVE Final    Comment: (NOTE) The Xpert Xpress SARS-CoV-2/FLU/RSV plus assay is intended as an aid in the diagnosis of  influenza from Nasopharyngeal swab specimens and should not be used as a sole basis for treatment. Nasal washings and aspirates are unacceptable for Xpert Xpress SARS-CoV-2/FLU/RSV testing.  Fact Sheet for Patients: EntrepreneurPulse.com.au  Fact Sheet for Healthcare Providers: IncredibleEmployment.be  This test is not yet  approved or cleared by the [place] and has been authorized for detection and/or diagnosis of SARS-CoV-2 by FDA under an Emergency Use Authorization (EUA). This EUA will remain in effect (meaning this test can be used) for the duration of the COVID-19 declaration under Section 564(b)(1) of the Act, 21 U.S.C. section 360bbb-3(b)(1), unless the authorization is terminated or revoked.     Resp Syncytial Virus by PCR NEGATIVE NEGATIVE Final    Comment: (NOTE) Fact Sheet for Patients: EntrepreneurPulse.com.au  Fact Sheet for Healthcare Providers: IncredibleEmployment.be  This test is not yet approved or cleared by the [place] FDA and has been authorized for detection and/or diagnosis of SARS-CoV-2 by FDA under an Emergency Use Authorization (EUA). This EUA will remain in effect (meaning this test can be used) for the duration of the COVID-19 declaration under Section 564(b)(1) of the Act, 21 U.S.C. section 360bbb-3(b)(1), unless the authorization is terminated or revoked.  Performed at [facility], [address] [place], [place] [address]   Urine Culture     Status: Abnormal   Collection Time: 11/08/22  4:47 AM   Specimen: Urine, Catheterized  Result Value Ref Range Status   Specimen Description   Final    URINE, CATHETERIZED Performed at [facility], [address] [place], [place] [address]    Special Requests   Final    NONE Performed at [facility], [address] [place], [place] [address]    Culture (A)  Final    >=100,000  COLONIES/mL PROTEUS MIRABILIS >=100,000 COLONIES/mL ESCHERICHIA COLI    Report Status 11/12/2022 FINAL  Final   Organism ID, Bacteria PROTEUS MIRABILIS (A)  Final   Organism ID, Bacteria ESCHERICHIA COLI (A)  Final      Susceptibility   Escherichia coli - MIC*    AMPICILLIN >=32 RESISTANT Resistant     CEFAZOLIN <=4 SENSITIVE Sensitive     CEFEPIME <=0.12 SENSITIVE Sensitive     CEFTRIAXONE <=0.25 SENSITIVE Sensitive     CIPROFLOXACIN <=0.25 SENSITIVE Sensitive     GENTAMICIN <=1 SENSITIVE Sensitive     IMIPENEM <=0.25 SENSITIVE Sensitive     NITROFURANTOIN <=16 SENSITIVE Sensitive     TRIMETH/SULFA >=320 RESISTANT Resistant     AMPICILLIN/SULBACTAM 16 INTERMEDIATE Intermediate     PIP/TAZO <=4 SENSITIVE Sensitive     * >=100,000 COLONIES/mL ESCHERICHIA COLI   Proteus mirabilis - MIC*    AMPICILLIN <=2 SENSITIVE Sensitive     CEFAZOLIN 8 SENSITIVE Sensitive     CEFEPIME <=0.12 SENSITIVE Sensitive     CEFTRIAXONE <=0.25 SENSITIVE Sensitive     CIPROFLOXACIN <=0.25 SENSITIVE Sensitive     GENTAMICIN <=1 SENSITIVE Sensitive     IMIPENEM 2 SENSITIVE Sensitive     NITROFURANTOIN 128 RESISTANT Resistant     TRIMETH/SULFA <=20 SENSITIVE Sensitive     AMPICILLIN/SULBACTAM <=2 SENSITIVE Sensitive     PIP/TAZO <=4 SENSITIVE Sensitive     * >=100,000 COLONIES/mL PROTEUS MIRABILIS  Surgical pcr screen     Status: None   Collection Time: 11/13/22  2:44 AM   Specimen: Nasal Mucosa; Nasal Swab  Result Value Ref Range Status   MRSA, PCR NEGATIVE NEGATIVE Final   Staphylococcus aureus NEGATIVE NEGATIVE Final    Comment: (NOTE) The Xpert SA Assay (FDA approved for NASAL specimens in patients 41 years of age and older), is one component of a comprehensive surveillance program. It is not intended to diagnose infection nor to guide or monitor treatment. Performed at [facility], [address] [place], [place] [address]      Radiology Studies: No results found.   Marzetta Board, MD, PhD [facility]  Between 7 am - 7 pm I am available, please contact me via Amion (for emergencies) or Securechat (non urgent messages)  Between 7 pm - 7 am I am not available, please contact night coverage MD/APP via Amion

## 2022-11-17 DIAGNOSIS — K810 Acute cholecystitis: Secondary | ICD-10-CM | POA: Diagnosis not present

## 2022-11-17 LAB — CBC
HCT: 34.2 % — ABNORMAL LOW (ref 39.0–52.0)
Hemoglobin: 11.8 g/dL — ABNORMAL LOW (ref 13.0–17.0)
MCH: 34.7 pg — ABNORMAL HIGH (ref 26.0–34.0)
MCHC: 34.5 g/dL (ref 30.0–36.0)
MCV: 100.6 fL — ABNORMAL HIGH (ref 80.0–100.0)
Platelets: 168 10*3/uL (ref 150–400)
RBC: 3.4 MIL/uL — ABNORMAL LOW (ref 4.22–5.81)
RDW: 15 % (ref 11.5–15.5)
WBC: 9.1 10*3/uL (ref 4.0–10.5)
nRBC: 0 % (ref 0.0–0.2)

## 2022-11-17 LAB — COMPREHENSIVE METABOLIC PANEL
ALT: 33 U/L (ref 0–44)
AST: 43 U/L — ABNORMAL HIGH (ref 15–41)
Albumin: 2.1 g/dL — ABNORMAL LOW (ref 3.5–5.0)
Alkaline Phosphatase: 99 U/L (ref 38–126)
Anion gap: 9 (ref 5–15)
BUN: <5 mg/dL — ABNORMAL LOW (ref 8–23)
CO2: 28 mmol/L (ref 22–32)
Calcium: 7.9 mg/dL — ABNORMAL LOW (ref 8.9–10.3)
Chloride: 97 mmol/L — ABNORMAL LOW (ref 98–111)
Creatinine, Ser: 0.79 mg/dL (ref 0.61–1.24)
GFR, Estimated: >60 mL/min (ref >60)
Glucose, Bld: 92 mg/dL (ref 70–99)
Potassium: 3.6 mmol/L (ref 3.5–5.1)
Sodium: 134 mmol/L — ABNORMAL LOW (ref 135–145)
Total Bilirubin: 2 mg/dL — ABNORMAL HIGH (ref 0.3–1.2)
Total Protein: 5.8 g/dL — ABNORMAL LOW (ref 6.5–8.1)

## 2022-11-17 LAB — MAGNESIUM: Magnesium: 2 mg/dL (ref 1.7–2.4)

## 2022-11-17 NOTE — Progress Notes (Signed)
Order to discharge pt home.  Discharge instructions/AVS given to patient and reviewed - education provided as needed.  Pt advised to call PCP and/or come back to the hospital if there are any problems. Pt verbalized understanding.    Pt discharged home with oxygen.  Spoke with spouse, Sydell Axon, and let her know that the oxygen will need to be delivered home prior to [facility] picking up the pt.  Dora verbalized understanding.  Sydell Axon will call and notify staff of oxygen delivery.  Will continue to monitor pt until oxygen delivery home and PT[facility] arrival.

## 2022-11-17 NOTE — Progress Notes (Signed)
1/4 I spoke to wife Sydell Axon) via telephone and IMM Letter was explained and verbally acknowledged. Letter will be mailed to address on file.

## 2022-11-17 NOTE — Plan of Care (Signed)

## 2022-11-17 NOTE — Progress Notes (Signed)
Oxygen delivered to pts home - [facility] called for transport.

## 2022-11-17 NOTE — TOC Initial Note (Addendum)
Transition of Care (TOC) - Initial/Assessment Note  Spoke to patient and wife Stephen [name] at bedside.   Last PT note states bed level exercises can be done by patient and family.  Patient and Stephen [name] in agreement. They would like to continue services with [place] with same people. Spoke with Claiborne Billings with [place], patient active with HHPT/OT will need orders to continue care .   Patient will need [facility] transport home at discharge.   [facility] paperwork on chart    1317 Patient needing home oxygen. Spoke to patient at bedside and wife Stephen [name] by phone. Both aware. Oxygen will need to be delivered before [facility] will transport patient home. Both voiced understanding. Ordered home oxygen with Erasmo Downer with [place], she is aware discharge today by [facility]   [place] home oxygen has been delivered. [facility] called paperwork on chart. Secure chatted nurse   Patient Details  Name: Stephen [name] MRN: [id number] Date of Birth: 06/15/1946  Transition of Care [facility]) CM/SW Contact:    Marilu Favre, RN Phone Number: 11/17/2022, 10:13 AM  Clinical Narrative:                   Expected Discharge Plan: [place] Barriers to Discharge: Continued Medical Work up   Patient Goals and CMS Choice Patient states their goals for this hospitalization and ongoing recovery are:: to return to home CMS Medicare.gov Compare Post Acute Care list provided to:: Patient Choice offered to / list presented to : Patient      Expected Discharge Plan and Services   Discharge Planning Services: CM Consult Post Acute Care Choice: [place] arrangements for the past 2 months: Single Family Home                 DME Arranged: N/A DME Agency: NA       HH Arranged: PT, OT HH Agency: [place] Date [place]: 11/17/22 Time [place]: 1012 Representative spoke with at [place]: Claiborne Billings  Prior Living Arrangements/Services Living arrangements for the past 2  months: [place] with:: Spouse Patient language and need for interpreter reviewed:: Yes Do you feel safe going back to the place where you live?: Yes      Need for Family Participation in Patient Care: Yes (Comment) Care giver support system in place?: Yes (comment) Current home services: DME, Home PT, Home OT Criminal Activity/Legal Involvement Pertinent to Current Situation/Hospitalization: No - Comment as needed  Activities of Daily Living      Permission Sought/Granted   Permission granted to share information with : Yes, Verbal Permission Granted  Share Information with NAME: wife Stephen [name]  Permission granted to share info w AGENCY: [facility]        Emotional Assessment Appearance:: Appears stated age Attitude/Demeanor/Rapport: Engaged Affect (typically observed): Accepting Orientation: : Oriented to Self, Oriented to Place, Oriented to  Time, Oriented to Situation Alcohol / Substance Use: Not Applicable Psych Involvement: No (comment)  Admission diagnosis:  Acute cholecystitis [K81.0] Patient Active Problem List   Diagnosis Date Noted   Esophageal stricture 11/13/2022   Choledocholithiasis 11/10/2022   Choledocholithiasis with acute cholecystitis with obstruction 11/09/2022   Lung nodule, solitary 11/09/2022   Chronic diastolic CHF (congestive heart failure) ([place]) 11/09/2022   Alcohol abuse 11/09/2022   DNR (do not resuscitate) 11/09/2022   Sepsis ([place]) 11/08/2022   Pressure injury of skin 03/06/2021   Acute on chronic respiratory failure with hypoxia ([place]) 03/05/2021  Malnutrition of moderate degree 02/20/2021   Alcohol withdrawal syndrome ([place]) 02/20/2021   Aspiration into lower respiratory tract, sequela 02/19/2021   UTI (urinary tract infection) 02/19/2021   Acute respiratory failure with hypoxia ([place]) 02/19/2021   Aspiration pneumonia ([place]) 02/19/2021   Partial small bowel obstruction ([place]) 02/19/2021   Severe sepsis ([place])  02/19/2021   GERD (gastroesophageal reflux disease) 02/19/2021   Depression 02/19/2021   Alcohol use 02/19/2021   Rectal cancer ([place]) 11/15/2013   History of anemia    HTN (hypertension)    Smoker    PCP:  [name], MD Pharmacy:   [id number] [place] [address]Phone: [phone]: [phone]    Social Determinants of Health (SDOH) Social History: SDOH Screenings   Food Insecurity: No Food Insecurity (11/09/2022)  Housing: Low Risk  (11/09/2022)  Transportation Needs: No Transportation Needs (11/09/2022)  Utilities: Not At Risk (11/09/2022)  Tobacco Use: Medium Risk (11/15/2022)   SDOH Interventions:     Readmission Risk Interventions     No data to display

## 2022-11-17 NOTE — Discharge Summary (Signed)
Physician Discharge Summary  Stephen [name] MVE:[id number] DOB: Feb 25, 1946 DOA: 11/09/2022  PCP: [name], MD  Admit date: 11/09/2022 Discharge date: 11/17/2022  Admitted From: home Disposition:  home  Recommendations for Outpatient Follow-up:  Follow up with PCP in 1-2 weeks  Home Health: none Equipment/Devices: none  Discharge Condition: stable CODE STATUS: DNR Diet Orders (From admission, onward)     Start     Ordered   11/16/22 1533  Diet regular Room service appropriate? Yes; Fluid consistency: Thin  Diet effective now       Question Answer Comment  Room service appropriate? Yes   Fluid consistency: Thin      11/16/22 1532            HPI: Per admitting MD, Stephen [name] is a 77 y.o. male with medical history significant of h/o HTN and metastatic rectal cancer (2014) with foley and colostomy who presented yesterday to [facility] with abdominal pain, sepsis alert.   He developed acute abdominal pain, vomiting.  Pain started 2-3 days ago, although he has had some pain in the last 6-7 months with cramping in his ribs and abdomen.  Pain as periumbilixcal and lower abdomen and in the R flank.  Pain is mild now, would like some more fentanyl.  Last emesis was yesterday about 0600.  No fever.  He is better now than upon arrival.   Hospital Course / Discharge diagnoses: Active Problems:   HTN (hypertension)   Rectal cancer (HCC)   Choledocholithiasis with acute cholecystitis with obstruction   Lung nodule, solitary   Chronic diastolic CHF (congestive heart failure) (HCC)   Alcohol abuse   DNR (do not resuscitate)   Choledocholithiasis   Esophageal stricture   Principal problem Choledocholithiasis, concern for cholangitis -patient presented with transaminitis, abdominal pain, as well as leukocytosis.  General surgery as well as gastroenterology were consulted.  CT abdomen and pelvis on admission showed obstructive choledocholithiasis with CBD up to 1 cm, no  intrahepatic biliary ductal dilatation at this time.  He underwent an ERCP initially 12/28 but aborted due to concern for esophageal tear, and repeat ERCP on 12/31 was successful and underwent sphincterotomy and biliary sludge extraction.  General surgery following, underwent a HIDA scan which was negative.  On further review of his abdominal CT scan, it appears that he has a subcutaneous spontaneous hematoma in the right upper quadrant which may be the cause of his pain.  He is able to tolerate a regular diet, with negative HIDA scan surgery did not recommend any interventions and will be discharged home in stable condition.  He is tolerating a regular diet   Active problems Concern for community acquired pneumonia, acute hypoxic respiratory failure, cough -based on the chest x-ray.  CT scan of the abdomen pelvis showed bilateral effusions as well as concern for consolidation.  Completed antibiotics, now afebrile Esophageal tear-based on the CT scan-no further recommendations per GI Remote history of metastatic rectal cancer -has a colostomy in place Alcohol use-2-3 drinks a day, no evidence of withdrawals Lung nodule-outpatient follow-up Mild hyponatremia-stable, now corrected Thrombocytopenia-in the setting of acute infection, platelets have now normalized  Sepsis ruled out   Discharge Instructions   Allergies as of 11/17/2022   No Known Allergies      Medication List     TAKE these medications    esomeprazole 20 MG packet Commonly known as: NEXIUM Take 20 mg by mouth daily before breakfast.   hydrOXYzine 10 MG tablet Commonly known as: ATARAX  Take 10 mg by mouth 3 (three) times daily as needed for itching (allergies).   MAGNESIUM PO Take 1 tablet by mouth daily.   multivitamin with minerals Tabs tablet Take 1 tablet by mouth daily.   OMEGA 3 PO Take 1 tablet by mouth daily.   polyethylene glycol 17 g packet Commonly known as: MIRALAX / GLYCOLAX Take 17 g by mouth daily  as needed for moderate constipation.   vitamin B-12 100 MCG tablet Commonly known as: CYANOCOBALAMIN Take 100 mcg by mouth daily.   vitamin C 250 MG tablet Commonly known as: ASCORBIC ACID Take 250 mg by mouth daily.   Vitamin D (Cholecalciferol) 25 MCG (1000 UT) Tabs Take 1,000 Units by mouth daily.               Durable Medical Equipment  (From admission, onward)           Start     Ordered   11/17/22 1256  For home use only DME oxygen  Once       Question Answer Comment  Length of Need 6 Months   Mode or (Route) Nasal cannula   Liters per Minute 2   Frequency Continuous (stationary and portable oxygen unit needed)   Oxygen conserving device Yes   Oxygen delivery system Gas      11/17/22 1255            Follow-up Information     [facility] [place] Follow up.   Specialty: Home Health Services Contact information: [address] [place] [address] [phone]                 Consultations: GI Surgery  Procedures/Studies:  NM Hepatobiliary Liver Func  Result Date: 11/16/2022 CLINICAL DATA:  + RUQ ABD PAIN EXAM: NUCLEAR MEDICINE HEPATOBILIARY IMAGING TECHNIQUE: Sequential images of the abdomen were obtained out to 60 minutes following intravenous administration of radiopharmaceutical. RADIOPHARMACEUTICALS:  mCi Tc-30m Choletec IV COMPARISON:  None Available. FINDINGS: Prompt uptake and biliary excretion of activity by the liver is seen. Gallbladder activity is visualized, consistent with patency of cystic duct. Biliary activity passes into small bowel, consistent with patent common bile duct. IMPRESSION: Patent cystic and common bile ducts. Electronically Signed   By: JDahlia BailiffM.D.   On: 11/16/2022 12:37   UKoreaAbdomen Limited RUQ (LIVER/GB)  Result Date: 11/14/2022 CLINICAL DATA:  Abdominal pain. EXAM: ULTRASOUND ABDOMEN LIMITED RIGHT UPPER QUADRANT COMPARISON:  CT scan of the abdomen and pelvis November 13, 2022 FINDINGS:  Gallbladder: Persistent cholelithiasis. The gallbladder wall measures 5 mm in thickness. No Murphy's sign or pericholecystic fluid. Common bile duct: Diameter: 3.3 mm Liver: Diffuse increased echogenicity throughout the liver. No mass. Portal vein is patent on color Doppler imaging with normal direction of blood flow towards the liver. Other: None. IMPRESSION: 1. Persistent cholelithiasis. The gallbladder wall is thickened to 5 mm. No Murphy's sign or pericholecystic fluid. If there is clinical concern for acute cholecystitis, recommend a HIDA scan. 2. Diffuse increased echogenicity throughout the liver consistent with hepatic steatosis identified on the comparison CT of the abdomen and pelvis. Electronically Signed   By: DDorise BullionIII M.D.   On: 11/14/2022 13:18   CT ABDOMEN PELVIS W CONTRAST  Result Date: 11/13/2022 CLINICAL DATA:  Acute abdominal pain following ERCP, initial encounter EXAM: CT ABDOMEN AND PELVIS WITH CONTRAST TECHNIQUE: Multidetector CT imaging of the abdomen and pelvis was performed using the standard protocol following bolus administration of intravenous contrast.  RADIATION DOSE REDUCTION: This exam was performed according to the departmental dose-optimization program which includes automated exposure control, adjustment of the mA and/or kV according to patient size and/or use of iterative reconstruction technique. CONTRAST:  14m OMNIPAQUE IOHEXOL 350 MG/ML SOLN COMPARISON:  ERCP from earlier in the same day, CT from 11/08/2022 FINDINGS: Lower chest: Bibasilar atelectasis is noted with small effusions new from the prior exam. Scattered calcified granulomas are seen. Hepatobiliary: Gallbladder is well distended with multiple dependent gallstones. A small focus of air is noted related to the recent ERCP. No biliary ductal dilatation is seen. The previously seen dilated common bile duct has resolved. No definitive filling defects are identified. The previously seen calcification in the  head of the pancreas has resolved as well. Pancreas: Pancreas is within normal limits. Spleen: Normal in size without focal abnormality. Adrenals/Urinary Tract: Adrenal glands are within normal limits. Kidneys are well visualize within normal enhancement pattern. No renal calculi or obstructive changes are seen. Stable cysts are noted within the right kidney. No follow-up is recommended. The bladder is decompressed. Stomach/Bowel: Left-sided colostomy is noted. No Hartmann's pouch is noted consistent with prior low anterior resection. The appendix is within normal limits. Small bowel and stomach are unremarkable. Vascular/Lymphatic: Aortic atherosclerosis. No enlarged abdominal or pelvic lymph nodes. Reproductive: Prostate is unremarkable. Other: No abdominal wall hernia or abnormality. No abdominopelvic ascites. Musculoskeletal: Chronic compression deformities of L4 and L5 are noted. IMPRESSION: New bilateral pleural effusions with bibasilar consolidation/atelectasis. No findings to suggest acute complication from the recent ERCP and sphincterotomy. Previously seen common bile duct densities have resolved. Cholelithiasis without complicating factors. No new focal abnormality is noted. Electronically Signed   By: MInez CatalinaM.D.   On: 11/13/2022 21:27   DG ERCP  Result Date: 11/13/2022 CLINICAL DATA:  77year old male with history of choledocholithiasis and undergoing ERCP EXAM: ERCP TECHNIQUE: Multiple spot images obtained with the fluoroscopic device and submitted for interpretation post-procedure. FLUOROSCOPY TIME:  190.99 mGy COMPARISON:  CT abdomen pelvis from 11/08/2022 FINDINGS: There is retrograde cannulation of the common bile duct. Retrograde cholangiogram demonstrates moderate extrahepatic biliary ductal dilation with filling defects in the distal common bile duct compatible with choledocholithiasis. Balloon sweep was performed. IMPRESSION: Obstructive distal choledocholithiasis. Balloon sweep was  performed with apparent clearance of choledocholithiasis. These images were submitted for radiologic interpretation only. Please see the procedural report for the full procedural details, amount of contrast, and the fluoroscopy time utilized. DRuthann Cancer MD [facility] Electronically Signed   By: DRuthann CancerM.D.   On: 11/13/2022 19:57   DG CHEST PORT 1 VIEW  Result Date: 11/13/2022 CLINICAL DATA:  Dyspnea EXAM: PORTABLE CHEST 1 VIEW COMPARISON:  11/12/2022 FINDINGS: Stable cardiomediastinal contours. Progressive perihilar and bibasilar interstitial opacities. Possible small bilateral pleural effusions. No pneumothorax. IMPRESSION: Progressive perihilar and bibasilar interstitial opacities. Electronically Signed   By: NDavina PokeD.O.   On: 11/13/2022 16:16   DG CHEST PORT 1 VIEW  Result Date: 11/12/2022 CLINICAL DATA:  Leukocytosis EXAM: PORTABLE CHEST 1 VIEW COMPARISON:  CT of the chest November 10, 2022. Chest x-ray November 05, 2022. FINDINGS: Cardiomediastinal silhouette is stable. No pneumothorax. No mediastinal air identified on today's study. Continued interstitial changes in the lungs. The known suspicious nodule in the right upper lobe is not well appreciated on chest x-ray imaging. No other interval changes. IMPRESSION: 1. The known suspicious nodule in the right upper lobe is not well appreciated on chest x-ray imaging. See  the recent chest CT report for evaluation recommendations. 2. Continued interstitial changes in the lungs. 3. No other acute abnormalities. Electronically Signed   By: Dorise Bullion III M.D.   On: 11/12/2022 12:06   CT CHEST WO CONTRAST  Result Date: 11/10/2022 CLINICAL DATA:  Esophageal perforation EXAM: CT CHEST WITHOUT CONTRAST TECHNIQUE: Multidetector CT imaging of the chest was performed following the standard protocol without IV contrast. 50 cc of oral Omnipaque was administered prior to  evaluation to assess for esophageal perforation. RADIATION DOSE REDUCTION: This exam was performed according to the departmental dose-optimization program which includes automated exposure control, adjustment of the mA and/or kV according to patient size and/or use of iterative reconstruction technique. COMPARISON:  11/08/2022, 11/10/2022, 03/05/2021 FINDINGS: Cardiovascular: Enhanced imaging of the heart and great vessels demonstrates no pericardial effusion. Normal caliber of the thoracic aorta. Atherosclerosis of the aorta and coronary vasculature. Mediastinum/Nodes: Streak artifact from the oral contrast does somewhat limit the evaluation. There appears to be a 9 mm defect within the left posterolateral aspect of the distal esophagus just proximal to the GE junction,, reference image 161/3. Contrast does not extend into the in paraesophageal soft tissues or into the upper abdomen, and this likely reflects a focal contained rent. There is also a thin linear area of contrast along the left anterolateral aspect of the upper thoracic esophagus, reference image 63-80 of series 3, which could reflect a small upper esophageal tear as well. There is no extension of contrast into the paraesophageal soft tissues or posterior mediastinum. The thyroid and trachea are unremarkable.  No pathologic adenopathy. Lungs/Pleura: There is a spiculated part solid right apical pulmonary nodule measuring 14 x 11 x 12 mm, reference image 71/4. The solid component of this mass measures approximately 10 mm. A similar area was seen on prior chest CT 03/05/2021, obscured by surrounding multifocal pneumonia. This is highly concerning for bronchogenic neoplasm. Dependent consolidation within the right lower lobe consistent with atelectasis or scarring. Prior partial left lower lobe resection. No airspace disease, effusion, or pneumothorax. Upper lobe predominant emphysema. The central airways are patent. Upper Abdomen: Calcified gallstones  are again identified. No evidence of acute cholecystitis. The downstream common bile duct demonstrating choledocholithiasis is not included on this exam due to slice selection. Musculoskeletal: No acute or destructive bony lesions. Severe C6-7 spondylosis. Reconstructed images demonstrate no additional findings. IMPRESSION: 1. Focal rent within the left posterolateral aspect of the distal thoracic esophagus just proximal to the gastroesophageal junction. Contrast is seen extending into the defect, but does not involve the para esophageal soft tissues or extend into the upper abdomen. There is no surrounding phlegmon. This is consistent with a contained tear. 2. Thin linear tracking of contrast along the left anterolateral aspect of the proximal thoracic esophagus, which may reflect a contained tear in this region as well. No extension of contrast into the paraesophageal soft tissues or posterior mediastinum. No surrounding inflammatory changes. 3. 13 mm suspicious right part-solid pulmonary nodule within the upper lobe. Per Fleischner Society Guidelines, given the increase in size of the solid component of this part solid nodule, recommend PET/CT, biopsy or resection. These guidelines do not apply to immunocompromised patients and patients with cancer. Follow up in patients with significant comorbidities as clinically warranted. For lung cancer screening, adhere to Lung-RADS guidelines. Reference: Radiology. 2017; 284(1):228-43. 4. Aortic Atherosclerosis (ICD10-I70.0) and Emphysema (ICD10-J43.9). 5. Cholelithiasis. Critical Value/emergent results were called by telephone at the time of interpretation on 11/10/2022 at 5:15 pm to provider [facility]  PERRY, who verbally acknowledged these results. Electronically Signed   By: Randa Ngo M.D.   On: 11/10/2022 17:20   DG Abd Portable 2V  Result Date: 11/10/2022 CLINICAL DATA:  Constipation and nausea in a 77 year old male. EXAM: PORTABLE ABDOMEN - 2 VIEW COMPARISON:   CT imaging from November 08, 2022. FINDINGS: Study is limited by patient body habitus. EKG leads project over the chest and abdomen. Global dilation of small bowel loops. These are moderately dilated up to 3 cm in some areas. Scattered gas-filled loops of large bowel. No free air beneath either the RIGHT or LEFT hemidiaphragm. Mild moderate gastric distension with small hiatal hernia. LEFT lower quadrant colostomy noted on prior CT. On limited assessment no acute regional skeletal findings. IMPRESSION: 1. Global dilation of small bowel loops. Findings could reflect developing small bowel obstruction or ileus. Attention on follow-up. 2. No free air. 3. Signs of hiatal hernia. Electronically Signed   By: Zetta Bills M.D.   On: 11/10/2022 10:52   ECHOCARDIOGRAM COMPLETE  Result Date: 11/09/2022    ECHOCARDIOGRAM REPORT   Patient Name:   Kimsey Demaree Date of Exam: 11/09/2022 Medical Rec #:  [id number]       Height:       67.0 in Accession #:    [id number]      Weight:       198.0 lb Date of Birth:  Nov 30, 1945       BSA:          2.014 m Patient Age:    49 years        BP:           141/77 mmHg Patient Gender: M               HR:           93 bpm. Exam Location:  Inpatient Procedure: 2D Echo, Color Doppler, Cardiac Doppler and Intracardiac            Opacification Agent STAT ECHO Indications:    Preoperative evaluation  History:        Patient has no prior history of Echocardiogram examinations.                 Risk Factors:Former Smoker, Hypertension and ETOH. Hx of cancer                 s/p chemotherapy.  Sonographer:    Eartha Inch Referring Phys: Margie Billet, A  Sonographer Comments: Technically difficult study due to poor echo windows. Image acquisition challenging due to patient body habitus and Image acquisition challenging due to respiratory motion. IMPRESSIONS  1. Left ventricular ejection fraction, by estimation, is 60 to 65%. The left ventricle has normal function. The left ventricle has no  regional wall motion abnormalities. Left ventricular diastolic parameters are consistent with Grade I diastolic dysfunction (impaired relaxation).  2. Right ventricular systolic function is normal. The right ventricular size is normal. Tricuspid regurgitation signal is inadequate for assessing PA pressure.  3. The mitral valve is grossly normal. Trivial mitral valve regurgitation.  4. The aortic valve is tricuspid. Aortic valve regurgitation is not visualized. Aortic valve sclerosis is present, with no evidence of aortic valve stenosis.  5. Aortic dilatation noted. There is borderline dilatation of the aortic root, measuring 38 mm.  6. The inferior vena cava is normal in size with greater than 50% respiratory variability, suggesting right atrial pressure of 3 mmHg. Comparison(s): No prior Echocardiogram. FINDINGS  Left Ventricle: Left ventricular  ejection fraction, by estimation, is 60 to 65%. The left ventricle has normal function. The left ventricle has no regional wall motion abnormalities. Definity contrast agent was given IV to delineate the left ventricular  endocardial borders. The left ventricular internal cavity size was normal in size. There is no left ventricular hypertrophy. Left ventricular diastolic parameters are consistent with Grade I diastolic dysfunction (impaired relaxation). Right Ventricle: The right ventricular size is normal. No increase in right ventricular wall thickness. Right ventricular systolic function is normal. Tricuspid regurgitation signal is inadequate for assessing PA pressure. Left Atrium: Left atrial size was normal in size. Right Atrium: Right atrial size was normal in size. Pericardium: There is no evidence of pericardial effusion. Mitral Valve: The mitral valve is grossly normal. Trivial mitral valve regurgitation. MV peak gradient, 3.2 mmHg. The mean mitral valve gradient is 1.0 mmHg. Tricuspid Valve: The tricuspid valve is normal in structure. Tricuspid valve regurgitation  is not demonstrated. Aortic Valve: The aortic valve is tricuspid. Aortic valve regurgitation is not visualized. Aortic valve sclerosis is present, with no evidence of aortic valve stenosis. Pulmonic Valve: The pulmonic valve was normal in structure. Pulmonic valve regurgitation is trivial. Aorta: Aortic dilatation noted. There is borderline dilatation of the aortic root, measuring 38 mm. Venous: The inferior vena cava is normal in size with greater than 50% respiratory variability, suggesting right atrial pressure of 3 mmHg. IAS/Shunts: The atrial septum is grossly normal.  LEFT VENTRICLE PLAX 2D LVIDd:         3.90 cm   Diastology LVIDs:         3.00 cm   LV e' medial:    5.98 cm/s LV PW:         0.90 cm   LV E/e' medial:  9.3 LV IVS:        0.80 cm   LV e' lateral:   10.20 cm/s LVOT diam:     2.10 cm   LV E/e' lateral: 5.4 LV SV:         58 LV SV Index:   29 LVOT Area:     3.46 cm  RIGHT VENTRICLE             IVC RV S prime:     14.50 cm/s  IVC diam: 0.70 cm TAPSE (M-mode): 1.7 cm LEFT ATRIUM             Index        RIGHT ATRIUM           Index LA diam:        3.10 cm 1.54 cm/m   RA Area:     15.90 cm LA Vol (A2C):   38.0 ml 18.87 ml/m  RA Volume:   44.40 ml  22.05 ml/m LA Vol (A4C):   41.4 ml 20.56 ml/m LA Biplane Vol: 41.7 ml 20.71 ml/m  AORTIC VALVE LVOT Vmax:   89.40 cm/s LVOT Vmean:  64.800 cm/s LVOT VTI:    0.168 m  AORTA Ao Root diam: 3.80 cm MITRAL VALVE MV Area (PHT): 2.45 cm    SHUNTS MV Area VTI:   2.69 cm    Systemic VTI:  0.17 m MV Peak grad:  3.2 mmHg    Systemic Diam: 2.10 cm MV Mean grad:  1.0 mmHg MV Vmax:       0.89 m/s MV Vmean:      56.0 cm/s MV Decel Time: 310 msec MV E velocity: 55.50 cm/s MV A velocity: 78.60 cm/s MV E/A  ratio:  0.71 Gwyndolyn Kaufman MD Electronically signed by Gwyndolyn Kaufman MD Signature Date/Time: 11/09/2022/2:19:04 PM    Final    CT Abdomen Pelvis W Contrast  Result Date: 11/08/2022 CLINICAL DATA:  77 year old male with history of acute onset of  nonlocalized abdominal pain radiating into the right side. Emesis. History of rectal cancer. * Tracking Code: BO * EXAM: CT ABDOMEN AND PELVIS WITH CONTRAST TECHNIQUE: Multidetector CT imaging of the abdomen and pelvis was performed using the standard protocol following bolus administration of intravenous contrast. RADIATION DOSE REDUCTION: This exam was performed according to the departmental dose-optimization program which includes automated exposure control, adjustment of the mA and/or kV according to patient size and/or use of iterative reconstruction technique. CONTRAST:  115m OMNIPAQUE IOHEXOL 300 MG/ML  SOLN COMPARISON:  CT of the abdomen and pelvis 03/05/2021. FINDINGS: Lower chest: Linear scarring or atelectasis in the right lower lobe. Small hiatal hernia. Atherosclerotic calcifications in the descending thoracic aorta as well as the right coronary artery. Hepatobiliary: Subcentimeter low-attenuation lesion in segment 4A of the liver, too small to characterize, but similar to the prior study and statistically likely tiny cysts (no imaging follow-up recommended). No other aggressive appearing hepatic lesions are noted. No intrahepatic biliary ductal dilatation. Common bile duct is dilated measuring 1 cm in the porta hepatis. There are multiple high attenuation stones in the distal common bile duct, largest of which measures up to 8 mm (coronal image 52 of series 5). Gallbladder is moderately distended with several small calcified gallstones lying dependently. Gallbladder wall thickness is normal. No pericholecystic fluid or surrounding inflammatory changes. Pancreas: No pancreatic mass. No pancreatic ductal dilatation noted on MRCP images. No pancreatic or peripancreatic fluid collections or inflammatory changes. Spleen: Unremarkable. Adrenals/Urinary Tract: Low-attenuation lesions in the right kidney measuring up to 2.2 cm in the posterior aspect of the upper pole, compatible with simple cysts (Bosniak  class 1, no imaging follow-up recommended). Left kidney and bilateral adrenal glands are otherwise unremarkable in appearance. No hydroureteronephrosis. Urinary bladder is largely decompressed, but otherwise unremarkable in appearance. Stomach/Bowel: The appearance of the stomach is unremarkable. There is no pathologic dilatation of small bowel or colon. Status post low anterior resection with left lower quadrant colostomy. Normal appendix. Vascular/Lymphatic: Atherosclerosis throughout the abdominal aorta and pelvic vasculature, without evidence of aneurysm or dissection. No lymphadenopathy noted in the abdomen or pelvis. Reproductive: Prostate gland and seminal vesicles are unremarkable in appearance. Other: No significant volume of ascites.  No pneumoperitoneum. Musculoskeletal: Chronic compression fractures of L4 and L5 are again noted, most severe at L4 where there is near complete loss of central vertebral body height, similar to the prior study. There are no aggressive appearing lytic or blastic lesions noted in the visualized portions of the skeleton. IMPRESSION: 1. Study is positive for obstructive choledocholithiasis with common bile duct measuring up to 1 cm in diameter. No intrahepatic biliary ductal dilatation is noted at this time. 2. Cholelithiasis without evidence of acute cholecystitis. 3. No definite signs of tumor recurrence or metastatic disease in the abdomen or pelvis. 4. Aortic atherosclerosis, in addition to at least right coronary artery disease. Assessment for potential risk factor modification, dietary therapy or pharmacologic therapy may be warranted, if clinically indicated. 5. Small hiatal hernia. 6. Additional incidental findings, as above. Electronically Signed   By: DVinnie LangtonM.D.   On: 11/08/2022 06:29   CT Head Wo Contrast  Result Date: 11/08/2022 CLINICAL DATA:  Mental status change with unknown cause EXAM: CT HEAD WITHOUT  CONTRAST TECHNIQUE: Contiguous axial images  were obtained from the base of the skull through the vertex without intravenous contrast. RADIATION DOSE REDUCTION: This exam was performed according to the departmental dose-optimization program which includes automated exposure control, adjustment of the mA and/or kV according to patient size and/or use of iterative reconstruction technique. COMPARISON:  03/05/2021 FINDINGS: Brain: No evidence of acute infarction, hemorrhage, hydrocephalus, extra-axial collection or mass lesion/mass effect. Generalized cortical atrophy. Vascular: No hyperdense vessel or unexpected calcification. Skull: Normal. Negative for fracture or focal lesion. Sinuses/Orbits: No acute finding. IMPRESSION: No acute or reversible finding. Generalized brain atrophy. Electronically Signed   By: Jorje Guild M.D.   On: 11/08/2022 06:21   DG Chest Port 1 View  Result Date: 11/08/2022 CLINICAL DATA:  77 year old male with history of sepsis. EXAM: PORTABLE CHEST 1 VIEW COMPARISON:  Chest x-ray 03/05/2021. FINDINGS: Lung volumes are low. Mild elevation of the right hemidiaphragm. Mild diffuse interstitial prominence. No consolidative airspace disease. No pleural effusions. No pneumothorax. Nodular density projecting over the left mid lung estimated measure 2.5 x 1.6 cm. Pulmonary vasculature and the cardiomediastinal silhouette are within normal limits. Atherosclerotic calcifications in the thoracic aorta. IMPRESSION: 1. Diffuse interstitial prominence, similar to prior studies, favored to be chronic. 2. New nodular density in the left mid lung concerning for potential neoplasm. Further evaluation with noncontrast chest CT should be considered. 3. Aortic atherosclerosis. Electronically Signed   By: Vinnie Langton M.D.   On: 11/08/2022 05:20     Subjective: - no chest pain, shortness of breath, no abdominal pain, nausea or vomiting.   Discharge Exam: BP 121/74 (BP Location: Left Arm)   Pulse 80   Temp 98.1 F (36.7 C) (Oral)   Resp  17   Ht '5\' 7"'$  (1.702 m)   Wt 93 kg   SpO2 95%   BMI 32.11 kg/m   General: Pt is alert, awake, not in acute distress Cardiovascular: RRR, S1/S2 +, no rubs, no gallops Respiratory: CTA bilaterally, no wheezing, no rhonchi Abdominal: Soft, NT, ND, bowel sounds + Extremities: no edema, no cyanosis    The results of significant diagnostics from this hospitalization (including imaging, microbiology, ancillary and laboratory) are listed below for reference.     Microbiology: Recent Results (from the past 240 hour(s))  Culture, blood (routine x 2)     Status: None   Collection Time: 11/08/22  4:47 AM   Specimen: BLOOD  Result Value Ref Range Status   Specimen Description BLOOD RIGHT ANTECUBITAL  Final   Special Requests   Final    BOTTLES DRAWN AEROBIC AND ANAEROBIC Blood Culture adequate volume   Culture   Final    NO GROWTH 5 DAYS Performed at [facility], [address], [place], [place] [address]    Report Status 11/13/2022 FINAL  Final  Resp panel by RT-PCR (RSV, Flu A&B, Covid) Anterior Nasal Swab     Status: None   Collection Time: 11/08/22  4:47 AM   Specimen: Anterior Nasal Swab  Result Value Ref Range Status   SARS Coronavirus 2 by RT PCR NEGATIVE NEGATIVE Final    Comment: (NOTE) SARS-CoV-2 target nucleic acids are NOT DETECTED.  The SARS-CoV-2 RNA is generally detectable in upper respiratory specimens during the acute phase of infection. The lowest concentration of SARS-CoV-2 viral copies this assay can detect is 138 copies/mL. A negative result does not preclude SARS-Cov-2 infection and should not be used as the sole basis for treatment or other patient management decisions. A  negative result may occur with  improper specimen collection/handling, submission of specimen other than nasopharyngeal swab, presence of viral mutation(s) within the areas targeted by this assay, and inadequate number of viral copies(<138 copies/mL). A negative result must be  combined with clinical observations, patient history, and epidemiological information. The expected result is Negative.  Fact Sheet for Patients:  EntrepreneurPulse.com.au  Fact Sheet for Healthcare Providers:  IncredibleEmployment.be  This test is no t yet approved or cleared by the [place] FDA and  has been authorized for detection and/or diagnosis of SARS-CoV-2 by FDA under an Emergency Use Authorization (EUA). This EUA will remain  in effect (meaning this test can be used) for the duration of the COVID-19 declaration under Section 564(b)(1) of the Act, 21 U.S.C.section 360bbb-3(b)(1), unless the authorization is terminated  or revoked sooner.       Influenza A by PCR NEGATIVE NEGATIVE Final   Influenza B by PCR NEGATIVE NEGATIVE Final    Comment: (NOTE) The Xpert Xpress SARS-CoV-2/FLU/RSV plus assay is intended as an aid in the diagnosis of influenza from Nasopharyngeal swab specimens and should not be used as a sole basis for treatment. Nasal washings and aspirates are unacceptable for Xpert Xpress SARS-CoV-2/FLU/RSV testing.  Fact Sheet for Patients: EntrepreneurPulse.com.au  Fact Sheet for Healthcare Providers: IncredibleEmployment.be  This test is not yet approved or cleared by the [place] FDA and has been authorized for detection and/or diagnosis of SARS-CoV-2 by FDA under an Emergency Use Authorization (EUA). This EUA will remain in effect (meaning this test can be used) for the duration of the COVID-19 declaration under Section 564(b)(1) of the Act, 21 U.S.C. section 360bbb-3(b)(1), unless the authorization is terminated or revoked.     Resp Syncytial Virus by PCR NEGATIVE NEGATIVE Final    Comment: (NOTE) Fact Sheet for Patients: EntrepreneurPulse.com.au  Fact Sheet for Healthcare Providers: IncredibleEmployment.be  This test is not yet  approved or cleared by the [place] FDA and has been authorized for detection and/or diagnosis of SARS-CoV-2 by FDA under an Emergency Use Authorization (EUA). This EUA will remain in effect (meaning this test can be used) for the duration of the COVID-19 declaration under Section 564(b)(1) of the Act, 21 U.S.C. section 360bbb-3(b)(1), unless the authorization is terminated or revoked.  Performed at [facility], [address] [place], [place] [address]   Urine Culture     Status: Abnormal   Collection Time: 11/08/22  4:47 AM   Specimen: Urine, Catheterized  Result Value Ref Range Status   Specimen Description   Final    URINE, CATHETERIZED Performed at [facility], [address] [place], [place] [address]    Special Requests   Final    NONE Performed at [facility], [address] [place], [place] [address]    Culture (A)  Final    >=100,000 COLONIES/mL PROTEUS MIRABILIS >=100,000 COLONIES/mL ESCHERICHIA COLI    Report Status 11/12/2022 FINAL  Final   Organism ID, Bacteria PROTEUS MIRABILIS (A)  Final   Organism ID, Bacteria ESCHERICHIA COLI (A)  Final      Susceptibility   Escherichia coli - MIC*    AMPICILLIN >=32 RESISTANT Resistant     CEFAZOLIN <=4 SENSITIVE Sensitive     CEFEPIME <=0.12 SENSITIVE Sensitive     CEFTRIAXONE <=0.25 SENSITIVE Sensitive     CIPROFLOXACIN <=0.25 SENSITIVE Sensitive     GENTAMICIN <=1 SENSITIVE Sensitive     IMIPENEM <=0.25 SENSITIVE Sensitive     NITROFURANTOIN <=16 SENSITIVE  Sensitive     TRIMETH/SULFA >=320 RESISTANT Resistant     AMPICILLIN/SULBACTAM 16 INTERMEDIATE Intermediate     PIP/TAZO <=4 SENSITIVE Sensitive     * >=100,000 COLONIES/mL ESCHERICHIA COLI   Proteus mirabilis - MIC*    AMPICILLIN <=2 SENSITIVE Sensitive     CEFAZOLIN 8 SENSITIVE Sensitive     CEFEPIME <=0.12 SENSITIVE Sensitive     CEFTRIAXONE <=0.25 SENSITIVE Sensitive     CIPROFLOXACIN <=0.25 SENSITIVE Sensitive      GENTAMICIN <=1 SENSITIVE Sensitive     IMIPENEM 2 SENSITIVE Sensitive     NITROFURANTOIN 128 RESISTANT Resistant     TRIMETH/SULFA <=20 SENSITIVE Sensitive     AMPICILLIN/SULBACTAM <=2 SENSITIVE Sensitive     PIP/TAZO <=4 SENSITIVE Sensitive     * >=100,000 COLONIES/mL PROTEUS MIRABILIS  Surgical pcr screen     Status: None   Collection Time: 11/13/22  2:44 AM   Specimen: Nasal Mucosa; Nasal Swab  Result Value Ref Range Status   MRSA, PCR NEGATIVE NEGATIVE Final   Staphylococcus aureus NEGATIVE NEGATIVE Final    Comment: (NOTE) The Xpert SA Assay (FDA approved for NASAL specimens in patients 77 years of age and older), is one component of a comprehensive surveillance program. It is not intended to diagnose infection nor to guide or monitor treatment. Performed at [facility], [place] [address] [place], [place] [address]      Labs: Basic Metabolic Panel: Recent Labs  Lab 11/13/22 0152 11/14/22 0606 11/15/22 0317 11/16/22 0232 11/17/22 0313  NA 136 133* 136 135 134*  K 4.6 4.0 3.5 3.5 3.6  CL 101 97* 98 97* 97*  CO2 '28 25 27 29 28  '$ GLUCOSE 117* 132* 82 108* 92  BUN 11 10 7* 5* <5*  CREATININE 1.24 1.13 0.91 0.80 0.79  CALCIUM 8.4* 8.1* 8.2* 8.0* 7.9*  MG  --   --   --   --  2.0   Liver Function Tests: Recent Labs  Lab 11/13/22 0152 11/14/22 0606 11/15/22 0317 11/16/22 0232 11/17/22 0313  AST 46* 36 41 44* 43*  ALT 44 36 35 37 33  ALKPHOS 116 93 90 100 99  BILITOT 4.0* 2.8* 2.4* 2.1* 2.0*  PROT 6.2* 5.9* 5.8* 5.6* 5.8*  ALBUMIN 2.3* 2.3* 2.3* 2.1* 2.1*   CBC: Recent Labs  Lab 11/13/22 0152 11/14/22 0606 11/15/22 0317 11/16/22 0232 11/17/22 0313  WBC 17.6* 16.4* 11.3* 10.8* 9.1  NEUTROABS 14.5*  --   --   --   --   HGB 13.3 11.0* 11.1* 11.4* 11.8*  HCT 39.2 31.7* 32.7* 33.0* 34.2*  MCV 103.2* 101.3* 101.9* 101.9* 100.6*  PLT 96* 131* 143* 151 168   CBG: Recent Labs  Lab 11/13/22 0101  GLUCAP 116*   Hgb A1c No results for input(s):  "HGBA1C" in the last 72 hours. Lipid Profile No results for input(s): "CHOL", "HDL", "LDLCALC", "TRIG", "CHOLHDL", "LDLDIRECT" in the last 72 hours. Thyroid function studies No results for input(s): "TSH", "T4TOTAL", "T3FREE", "THYROIDAB" in the last 72 hours.  Invalid input(s): "FREET3" Urinalysis    Component Value Date/Time   COLORURINE YELLOW (A) 11/08/2022 0447   APPEARANCEUR TURBID (A) 11/08/2022 0447   APPEARANCEUR Cloudy 04/28/2014 0900   LABSPEC 1.015 11/08/2022 0447   LABSPEC 1.023 04/28/2014 0900   PHURINE 8.0 11/08/2022 0447   GLUCOSEU NEGATIVE 11/08/2022 0447   GLUCOSEU Negative 04/28/2014 0900   HGBUR NEGATIVE 11/08/2022 [place] 11/08/2022 0447   BILIRUBINUR Negative 04/28/2014 0900  KETONESUR NEGATIVE 11/08/2022 0447   PROTEINUR >=300 (A) 11/08/2022 0447   NITRITE NEGATIVE 11/08/2022 0447   LEUKOCYTESUR MODERATE (A) 11/08/2022 0447   LEUKOCYTESUR Negative 04/28/2014 0900    FURTHER DISCHARGE INSTRUCTIONS:   Get Medicines reviewed and adjusted: Please take all your medications with you for your next visit with your Primary MD   Laboratory/radiological data: Please request your Primary MD to go over all hospital tests and procedure/radiological results at the follow up, please ask your Primary MD to get all Hospital records sent to his/her office.   In some cases, they will be blood work, cultures and biopsy results pending at the time of your discharge. Please request that your primary care M.D. goes through all the records of your hospital data and follows up on these results.   Also Note the following: If you experience worsening of your admission symptoms, develop shortness of breath, life threatening emergency, suicidal or homicidal thoughts you must seek medical attention immediately by calling 911 or calling your MD immediately  if symptoms less severe.   You must read complete instructions/literature along with all the possible  adverse reactions/side effects for all the Medicines you take and that have been prescribed to you. Take any new Medicines after you have completely understood and accpet all the possible adverse reactions/side effects.    Do not drive when taking Pain medications or sleeping medications (Benzodaizepines)   Do not take more than prescribed Pain, Sleep and Anxiety Medications. It is not advisable to combine anxiety,sleep and pain medications without talking with your primary care practitioner   Special Instructions: If you have smoked or chewed Tobacco  in the last 2 yrs please stop smoking, stop any regular Alcohol  and or any Recreational drug use.   Wear Seat belts while driving.   Please note: You were cared for by a hospitalist during your hospital stay. Once you are discharged, your primary care physician will handle any further medical issues. Please note that NO REFILLS for any discharge medications will be authorized once you are discharged, as it is imperative that you return to your primary care physician (or establish a relationship with a primary care physician if you do not have one) for your post hospital discharge needs so that they can reassess your need for medications and monitor your lab values.  Time coordinating discharge: 35 minutes  SIGNED:  Marzetta Board, MD, PhD 11/17/2022, 1:31 PM

## 2022-11-17 NOTE — Progress Notes (Signed)
SATURATION QUALIFICATIONS:   Patient Saturations on Room Air at Rest = 85%   Patient Saturations on 2 Liters of oxygen at rest = 95%  Please briefly explain why patient needs home oxygen:  Pt desat on room air at rest.  Pt bedridden and needed 2L of oxygen at rest to be >90%.

## 2022-11-17 NOTE — Progress Notes (Addendum)
4 Days Post-Op   Subjective/Chief Complaint: Still endorsing some right-sided abdominal pain with movement only.  Had some pork chop, green beans, and fries last night.  No pain after this or n/v.   Objective: Vital signs in last 24 hours: Temp:  [97.5 F (36.4 C)-98.4 F (36.9 C)] 97.8 F (36.6 C) (01/03 2050) Pulse Rate:  [77-84] 77 (01/04 0543) Resp:  [16-18] 18 (01/03 1536) BP: (115-159)/(71-95) 159/71 (01/04 0543) SpO2:  [94 %-98 %] 97 % (01/04 0543) Last BM Date : 11/14/22  Intake/Output from previous day: 01/03 0701 - 01/04 0700 In: 0  Out: 575 [Urine:575] Intake/Output this shift: No intake/output data recorded.  Gen: alert, cooperative NAD Abd: soft, some distention, right upper quadrant tender to palpation now with evolving subcutaneous hematoma and ecchymosis spanning to the right flank, , LLQ ostomy in place.  Lab Results:  Recent Labs    11/16/22 0232 11/17/22 0313  WBC 10.8* 9.1  HGB 11.4* 11.8*  HCT 33.0* 34.2*  PLT 151 168   BMET Recent Labs    11/16/22 0232 11/17/22 0313  NA 135 134*  K 3.5 3.6  CL 97* 97*  CO2 29 28  GLUCOSE 108* 92  BUN 5* <5*  CREATININE 0.80 0.79  CALCIUM 8.0* 7.9*   PT/INR No results for input(s): "LABPROT", "INR" in the last 72 hours.  ABG No results for input(s): "PHART", "HCO3" in the last 72 hours.  Invalid input(s): "PCO2", "PO2"  Studies/Results: NM Hepatobiliary Liver Func  Result Date: 11/16/2022 CLINICAL DATA:  + RUQ ABD PAIN EXAM: NUCLEAR MEDICINE HEPATOBILIARY IMAGING TECHNIQUE: Sequential images of the abdomen were obtained out to 60 minutes following intravenous administration of radiopharmaceutical. RADIOPHARMACEUTICALS:  mCi Tc-61m Choletec IV COMPARISON:  None Available. FINDINGS: Prompt uptake and biliary excretion of activity by the liver is seen. Gallbladder activity is visualized, consistent with patency of cystic duct. Biliary activity passes into small bowel, consistent with patent common bile  duct. IMPRESSION: Patent cystic and common bile ducts. Electronically Signed   By: JDahlia BailiffM.D.   On: 11/16/2022 12:37    Anti-infectives: Anti-infectives (From admission, onward)    Start     Dose/Rate Route Frequency Ordered Stop   11/13/22 1730  azithromycin (ZITHROMAX) 500 mg in sodium chloride 0.9 % 250 mL IVPB        500 mg 250 mL/hr over 60 Minutes Intravenous Every 24 hours 11/13/22 1638 11/18/22 1729   11/12/22 1400  meropenem (MERREM) 1 g in sodium chloride 0.9 % 100 mL IVPB        1 g 200 mL/hr over 30 Minutes Intravenous Every 8 hours 11/12/22 1128     11/09/22 1200  piperacillin-tazobactam (ZOSYN) IVPB 3.375 g  Status:  Discontinued        3.375 g 12.5 mL/hr over 240 Minutes Intravenous Every 8 hours 11/09/22 0951 11/12/22 1058       Assessment/Plan: Choledocholithiasis  s/p Procedure(s): ENDOSCOPIC RETROGRADE CHOLANGIOPANCREATOGRAPHY (ERCP) (N/A) ESOPHAGOGASTRODUODENOSCOPY (EGD) (N/A) Balloon dilation wire-guided SPHINCTEROTOMY REMOVAL OF STONES  History of choledocholithiasis status post ERCP x 1 with attempted ERCP x 1 with injury to the esophagus.   11/14/22: increased RUQ pain concerning for cholecystitis - Dr. SAnnamaria Bootsreviewed RUQ U/S, CT A/P, as well as ERCP imaging and cystic duct appears patent, not consistent with cholecystitis   -HIDA scan negative for cholecystitis.  Suspect RUQ pain secondary to what appears to maybe be a spontaneous intra-muscular RUQ hematoma. -given multiple comorbidities and no current evidence of cholecystitis,  no recommendation for lap chole at this time.  His choledocholithiasis has been addressed with his ERCP and sphincterotomy. -agree with medical team, if he tolerates his diet today he can go home later today -we will sign off at this time.  No definitive follow up is needed unless he starts having biliary symptoms. -d/w primary service at the bedside.  I have reviewed the last 24 hours of vitals, I/Os, labs, and notes  from the medical team and IR team.  LOS: 8 days    Henreitta Cea PA-C 11/17/2022

## 2022-11-18 NOTE — Progress Notes (Signed)
Patient discharged home via [facility].

## 2024-05-20 ENCOUNTER — Inpatient Hospital Stay
Admission: EM | Admit: 2024-05-20 | Discharge: 2024-06-12 | DRG: 329 | Disposition: A | Discharge status: Home Health | Attending: Hospitalist | Admitting: Hospitalist

## 2024-05-20 ENCOUNTER — Emergency Department

## 2024-05-20 ENCOUNTER — Other Ambulatory Visit: Payer: Self-pay

## 2024-05-20 ENCOUNTER — Inpatient Hospital Stay

## 2024-05-20 ENCOUNTER — Encounter: Payer: Self-pay | Admitting: Emergency Medicine

## 2024-05-20 DIAGNOSIS — K56609 Unspecified intestinal obstruction, unspecified as to partial versus complete obstruction: Principal | ICD-10-CM | POA: Diagnosis present

## 2024-05-20 DIAGNOSIS — N3001 Acute cystitis with hematuria: Secondary | ICD-10-CM | POA: Diagnosis present

## 2024-05-20 DIAGNOSIS — Z825 Family history of asthma and other chronic lower respiratory diseases: Secondary | ICD-10-CM

## 2024-05-20 DIAGNOSIS — I10 Essential (primary) hypertension: Secondary | ICD-10-CM | POA: Diagnosis present

## 2024-05-20 DIAGNOSIS — K567 Ileus, unspecified: Secondary | ICD-10-CM | POA: Diagnosis not present

## 2024-05-20 DIAGNOSIS — E860 Dehydration: Secondary | ICD-10-CM | POA: Diagnosis present

## 2024-05-20 DIAGNOSIS — K5651 Intestinal adhesions [bands], with partial obstruction: Secondary | ICD-10-CM | POA: Diagnosis present

## 2024-05-20 DIAGNOSIS — E872 Acidosis, unspecified: Secondary | ICD-10-CM | POA: Diagnosis present

## 2024-05-20 DIAGNOSIS — E663 Overweight: Secondary | ICD-10-CM | POA: Diagnosis present

## 2024-05-20 DIAGNOSIS — C20 Malignant neoplasm of rectum: Secondary | ICD-10-CM | POA: Diagnosis present

## 2024-05-20 DIAGNOSIS — Z79899 Other long term (current) drug therapy: Secondary | ICD-10-CM | POA: Diagnosis not present

## 2024-05-20 DIAGNOSIS — K769 Liver disease, unspecified: Secondary | ICD-10-CM | POA: Diagnosis not present

## 2024-05-20 DIAGNOSIS — C7802 Secondary malignant neoplasm of left lung: Secondary | ICD-10-CM | POA: Diagnosis present

## 2024-05-20 DIAGNOSIS — Z86718 Personal history of other venous thrombosis and embolism: Secondary | ICD-10-CM

## 2024-05-20 DIAGNOSIS — Z8249 Family history of ischemic heart disease and other diseases of the circulatory system: Secondary | ICD-10-CM

## 2024-05-20 DIAGNOSIS — C228 Malignant neoplasm of liver, primary, unspecified as to type: Secondary | ICD-10-CM | POA: Diagnosis present

## 2024-05-20 DIAGNOSIS — Z7401 Bed confinement status: Secondary | ICD-10-CM | POA: Diagnosis not present

## 2024-05-20 DIAGNOSIS — N179 Acute kidney failure, unspecified: Secondary | ICD-10-CM | POA: Diagnosis present

## 2024-05-20 DIAGNOSIS — Z923 Personal history of irradiation: Secondary | ICD-10-CM

## 2024-05-20 DIAGNOSIS — F1010 Alcohol abuse, uncomplicated: Secondary | ICD-10-CM | POA: Diagnosis present

## 2024-05-20 DIAGNOSIS — E440 Moderate protein-calorie malnutrition: Secondary | ICD-10-CM | POA: Diagnosis present

## 2024-05-20 DIAGNOSIS — N3000 Acute cystitis without hematuria: Secondary | ICD-10-CM | POA: Diagnosis not present

## 2024-05-20 DIAGNOSIS — Z933 Colostomy status: Secondary | ICD-10-CM | POA: Diagnosis not present

## 2024-05-20 DIAGNOSIS — Z85048 Personal history of other malignant neoplasm of rectum, rectosigmoid junction, and anus: Secondary | ICD-10-CM

## 2024-05-20 DIAGNOSIS — K219 Gastro-esophageal reflux disease without esophagitis: Secondary | ICD-10-CM | POA: Diagnosis present

## 2024-05-20 DIAGNOSIS — Z6833 Body mass index (BMI) 33.0-33.9, adult: Secondary | ICD-10-CM

## 2024-05-20 DIAGNOSIS — F1090 Alcohol use, unspecified, uncomplicated: Secondary | ICD-10-CM | POA: Diagnosis present

## 2024-05-20 DIAGNOSIS — K651 Peritoneal abscess: Secondary | ICD-10-CM | POA: Diagnosis not present

## 2024-05-20 DIAGNOSIS — Z902 Acquired absence of lung [part of]: Secondary | ICD-10-CM

## 2024-05-20 DIAGNOSIS — Z9221 Personal history of antineoplastic chemotherapy: Secondary | ICD-10-CM

## 2024-05-20 DIAGNOSIS — K920 Hematemesis: Secondary | ICD-10-CM | POA: Diagnosis present

## 2024-05-20 DIAGNOSIS — F32A Depression, unspecified: Secondary | ICD-10-CM | POA: Diagnosis present

## 2024-05-20 DIAGNOSIS — E871 Hypo-osmolality and hyponatremia: Secondary | ICD-10-CM | POA: Diagnosis present

## 2024-05-20 DIAGNOSIS — N390 Urinary tract infection, site not specified: Secondary | ICD-10-CM | POA: Diagnosis present

## 2024-05-20 DIAGNOSIS — T8131XA Disruption of external operation (surgical) wound, not elsewhere classified, initial encounter: Secondary | ICD-10-CM | POA: Diagnosis not present

## 2024-05-20 DIAGNOSIS — Z87891 Personal history of nicotine dependence: Secondary | ICD-10-CM

## 2024-05-20 DIAGNOSIS — Y838 Other surgical procedures as the cause of abnormal reaction of the patient, or of later complication, without mention of misadventure at the time of the procedure: Secondary | ICD-10-CM | POA: Diagnosis not present

## 2024-05-20 DIAGNOSIS — D7589 Other specified diseases of blood and blood-forming organs: Secondary | ICD-10-CM | POA: Diagnosis present

## 2024-05-20 DIAGNOSIS — D539 Nutritional anemia, unspecified: Secondary | ICD-10-CM | POA: Diagnosis present

## 2024-05-20 DIAGNOSIS — I110 Hypertensive heart disease with heart failure: Secondary | ICD-10-CM | POA: Diagnosis present

## 2024-05-20 DIAGNOSIS — Z66 Do not resuscitate: Secondary | ICD-10-CM | POA: Diagnosis present

## 2024-05-20 DIAGNOSIS — R059 Cough, unspecified: Secondary | ICD-10-CM | POA: Diagnosis present

## 2024-05-20 DIAGNOSIS — I5032 Chronic diastolic (congestive) heart failure: Secondary | ICD-10-CM | POA: Diagnosis present

## 2024-05-20 DIAGNOSIS — I700 Atherosclerosis of aorta: Secondary | ICD-10-CM | POA: Diagnosis present

## 2024-05-20 DIAGNOSIS — A419 Sepsis, unspecified organism: Secondary | ICD-10-CM

## 2024-05-20 LAB — URINALYSIS, ROUTINE W REFLEX MICROSCOPIC
Glucose, UA: NEGATIVE mg/dL
Hgb urine dipstick: NEGATIVE
Ketones, ur: 5 mg/dL — AB
Nitrite: NEGATIVE
Protein, ur: >=300 mg/dL — AB
RBC / HPF: >50 RBC/hpf (ref 0–5)
Specific Gravity, Urine: 1.027 (ref 1.005–1.030)
WBC, UA: >50 WBC/hpf (ref 0–5)
pH: 7 (ref 5.0–8.0)

## 2024-05-20 LAB — COMPREHENSIVE METABOLIC PANEL WITH GFR
ALT: 16 U/L (ref 0–44)
AST: 34 U/L (ref 15–41)
Albumin: 3.5 g/dL (ref 3.5–5.0)
Alkaline Phosphatase: 99 U/L (ref 38–126)
Anion gap: 17 — ABNORMAL HIGH (ref 5–15)
BUN: 27 mg/dL — ABNORMAL HIGH (ref 8–23)
CO2: 25 mmol/L (ref 22–32)
Calcium: 9.9 mg/dL (ref 8.9–10.3)
Chloride: 99 mmol/L (ref 98–111)
Creatinine, Ser: 1.91 mg/dL — ABNORMAL HIGH (ref 0.61–1.24)
GFR, Estimated: 36 mL/min — ABNORMAL LOW (ref >60)
Glucose, Bld: 154 mg/dL — ABNORMAL HIGH (ref 70–99)
Potassium: 3.6 mmol/L (ref 3.5–5.1)
Sodium: 141 mmol/L (ref 135–145)
Total Bilirubin: 2.4 mg/dL — ABNORMAL HIGH (ref 0.0–1.2)
Total Protein: 8.2 g/dL — ABNORMAL HIGH (ref 6.5–8.1)

## 2024-05-20 LAB — CBC
HCT: 52 % (ref 39.0–52.0)
HCT: 49.6 % (ref 39.0–52.0)
Hemoglobin: 17.8 g/dL — ABNORMAL HIGH (ref 13.0–17.0)
Hemoglobin: 17.2 g/dL — ABNORMAL HIGH (ref 13.0–17.0)
MCH: 35 pg — ABNORMAL HIGH (ref 26.0–34.0)
MCH: 35.3 pg — ABNORMAL HIGH (ref 26.0–34.0)
MCHC: 34.2 g/dL (ref 30.0–36.0)
MCHC: 34.7 g/dL (ref 30.0–36.0)
MCV: 102.2 fL — ABNORMAL HIGH (ref 80.0–100.0)
MCV: 101.8 fL — ABNORMAL HIGH (ref 80.0–100.0)
Platelets: 246 K/uL (ref 150–400)
Platelets: 227 K/uL (ref 150–400)
RBC: 5.09 MIL/uL (ref 4.22–5.81)
RBC: 4.87 MIL/uL (ref 4.22–5.81)
RDW: 14.3 % (ref 11.5–15.5)
RDW: 14.4 % (ref 11.5–15.5)
WBC: 13.9 K/uL — ABNORMAL HIGH (ref 4.0–10.5)
WBC: 13.5 K/uL — ABNORMAL HIGH (ref 4.0–10.5)
nRBC: 0 % (ref 0.0–0.2)
nRBC: 0 % (ref 0.0–0.2)

## 2024-05-20 LAB — BRAIN NATRIURETIC PEPTIDE: B Natriuretic Peptide: 161.2 pg/mL — ABNORMAL HIGH (ref 0.0–100.0)

## 2024-05-20 LAB — PROTIME-INR
INR: 1.2 (ref 0.8–1.2)
Prothrombin Time: 15.9 s — ABNORMAL HIGH (ref 11.4–15.2)

## 2024-05-20 LAB — LACTIC ACID, PLASMA
Lactic Acid, Venous: 1.4 mmol/L (ref 0.5–1.9)
Lactic Acid, Venous: 2.9 mmol/L (ref 0.5–1.9)

## 2024-05-20 LAB — APTT: aPTT: 23 s — ABNORMAL LOW (ref 24–36)

## 2024-05-20 LAB — LIPASE, BLOOD: Lipase: 26 U/L (ref 11–51)

## 2024-05-20 MED ORDER — FOLIC ACID 1 MG PO TABS
1.0000 mg | ORAL_TABLET | Freq: Every day | ORAL | Status: DC
  Administered 2024-05-21 – 2024-05-30 (×8): 1 mg via ORAL
  Filled 2024-05-20 (×9): qty 1

## 2024-05-20 MED ORDER — ORAL CARE MOUTH RINSE: 15.0000 mL | OROMUCOSAL | Status: DC | PRN

## 2024-05-20 MED ORDER — SODIUM CHLORIDE 0.9 % IV SOLN
2.0000 g | Freq: Once | INTRAVENOUS | Status: AC
  Administered 2024-05-20: 2 g via INTRAVENOUS
  Filled 2024-05-20: qty 20

## 2024-05-20 MED ORDER — ONDANSETRON HCL 4 MG/2ML IJ SOLN
4.0000 mg | Freq: Three times a day (TID) | INTRAMUSCULAR | Status: DC | PRN
  Administered 2024-05-29 – 2024-06-07 (×5): 4 mg via INTRAVENOUS
  Filled 2024-05-20 (×6): qty 2

## 2024-05-20 MED ORDER — THIAMINE HCL 100 MG/ML IJ SOLN
100.0000 mg | Freq: Every day | INTRAMUSCULAR | Status: DC
  Administered 2024-05-21 – 2024-05-30 (×10): 100 mg via INTRAVENOUS
  Filled 2024-05-20 (×10): qty 2

## 2024-05-20 MED ORDER — ONDANSETRON HCL 4 MG/2ML IJ SOLN
4.0000 mg | Freq: Once | INTRAMUSCULAR | Status: AC
  Administered 2024-05-20: 4 mg via INTRAVENOUS
  Filled 2024-05-20: qty 2

## 2024-05-20 MED ORDER — MORPHINE SULFATE (PF) 4 MG/ML IV SOLN
4.0000 mg | Freq: Once | INTRAVENOUS | Status: AC
  Administered 2024-05-20: 4 mg via INTRAVENOUS
  Filled 2024-05-20: qty 1

## 2024-05-20 MED ORDER — SODIUM CHLORIDE 0.9 % IV BOLUS
500.0000 mL | Freq: Once | INTRAVENOUS | Status: AC
  Administered 2024-05-21: 500 mL via INTRAVENOUS

## 2024-05-20 MED ORDER — SODIUM CHLORIDE 0.9 % IV SOLN: INTRAVENOUS | Status: DC

## 2024-05-20 MED ORDER — SODIUM CHLORIDE 0.9 % IV BOLUS
1000.0000 mL | Freq: Once | INTRAVENOUS | Status: AC
  Administered 2024-05-20: 1000 mL via INTRAVENOUS

## 2024-05-20 MED ORDER — HYDRALAZINE HCL 20 MG/ML IJ SOLN: 5.0000 mg | INTRAMUSCULAR | Status: DC | PRN

## 2024-05-20 MED ORDER — OXYCODONE-ACETAMINOPHEN 5-325 MG PO TABS
1.0000 | ORAL_TABLET | ORAL | Status: DC | PRN
  Administered 2024-05-27 – 2024-06-05 (×6): 1 via ORAL
  Filled 2024-05-20 (×6): qty 1

## 2024-05-20 MED ORDER — MORPHINE SULFATE (PF) 2 MG/ML IV SOLN
1.0000 mg | INTRAVENOUS | Status: DC | PRN
  Administered 2024-05-24: 1 mg via INTRAVENOUS
  Filled 2024-05-20: qty 1

## 2024-05-20 MED ORDER — ACETAMINOPHEN 650 MG RE SUPP: 650.0000 mg | Freq: Four times a day (QID) | RECTAL | Status: DC | PRN

## 2024-05-20 MED ORDER — IOHEXOL 300 MG/ML  SOLN
80.0000 mL | Freq: Once | INTRAMUSCULAR | Status: AC | PRN
  Administered 2024-05-20: 80 mL via INTRAVENOUS

## 2024-05-20 MED ORDER — PANTOPRAZOLE SODIUM 40 MG IV SOLR
40.0000 mg | Freq: Two times a day (BID) | INTRAVENOUS | Status: DC
  Administered 2024-05-21 – 2024-06-12 (×44): 40 mg via INTRAVENOUS
  Filled 2024-05-20 (×44): qty 10

## 2024-05-20 NOTE — Progress Notes (Signed)
 CODE SEPSIS - PHARMACY COMMUNICATION  **Broad Spectrum Antibiotics should be administered within 1 hour of Sepsis diagnosis**  Time Code Sepsis Called/Page Received: 1750  Antibiotics Ordered: ceftriaxone   Time of 1st antibiotic administration: 1815  Additional action taken by pharmacy: None  If necessary, Name of Provider/Nurse Contacted: None    Damien Napoleon ,PharmD Clinical Pharmacist  05/20/2024  6:22 PM

## 2024-05-20 NOTE — ED Provider Notes (Signed)
 [facility] Provider Note    Event Date/Time   First MD Initiated Contact with Patient 05/20/24 1610     (approximate)   History   Emesis   HPI Stephen [name] is a 78 y.o. 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  Patient states 4 days ago he had onset of generalized abdominal pain with vomiting.  Has not been able to tolerate any p.o. intake since then.  Still reports small amount of colostomy output.  Also reports 2 days ago having onset of burning urinary pain for which she wears a condom catheter.  Denies any cough, congestion, shortness of breath, fever, chills.  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     Physical Exam   Triage Vital Signs: ED Triage Vitals  Encounter Vitals Group     BP 05/20/24 1615 (!) 134/97     Girls Systolic BP Percentile --      Girls Diastolic BP Percentile --      Boys Systolic BP Percentile --      Boys Diastolic BP Percentile --      Pulse Rate 05/20/24 1615 (!) 118     Resp 05/20/24 1615 17     Temp 05/20/24 1615 98.2 F (36.8 C)     Temp Source 05/20/24 1615 Oral     SpO2 05/20/24 1615 94 %     Weight 05/20/24 1614 187 lb (84.8 kg)     Height 05/20/24 1614 5' 7 (1.702 m)     Head Circumference --      Peak Flow --      Pain Score 05/20/24 1614 3     Pain Loc --      Pain Education --      Exclude from Growth Chart --     Most recent vital signs: Vitals:   05/20/24 2145 05/20/24 2249  BP:  129/84  Pulse: (!) 106 (!) 125  Resp: 11 20  Temp:  97.9 F (36.6 C)  SpO2: 93% 91%   Physical Exam: I have reviewed the vital signs and nursing notes. General: Awake, alert, no acute distress.  Nontoxic appearing. Head:  Atraumatic, normocephalic.   ENT:  EOM intact, PERRL. Oral mucosa is pink and moist with no lesions. Neck: Neck is supple with full range of motion, No meningeal  signs. Cardiovascular:  tachycardia, RR, No murmurs. Peripheral pulses palpable and equal bilaterally. Respiratory:  Symmetrical chest wall expansion.  No rhonchi, rales, or wheezes.  Good air movement throughout.  No use of accessory muscles.   Musculoskeletal:  No cyanosis or edema. Moving extremities with full ROM Abdomen:  Soft, tenderness to palpation throughout the abdomen.  Colostomy in left lower quadrant Neuro:  GCS 15, moving all four extremities, interacting appropriately. Speech clear. Psych:  Calm, appropriate.   Skin:  Warm, dry, no rash.    ED Results / Procedures / Treatments   Labs (all labs ordered are listed, but only abnormal results are displayed) Labs Reviewed  COMPREHENSIVE METABOLIC PANEL WITH GFR - Abnormal; Notable for the following components:      Result Value   Glucose, Bld 154 (*)    BUN 27 (*)    Creatinine, Ser 1.91 (*)    Total Protein 8.2 (*)    Total Bilirubin 2.4 (*)    GFR, Estimated 36 (*)    Anion gap 17 (*)    All  other components within normal limits  CBC - Abnormal; Notable for the following components:   WBC 13.9 (*)    Hemoglobin 17.8 (*)    MCV 102.2 (*)    MCH 35.0 (*)    All other components within normal limits  URINALYSIS, ROUTINE W REFLEX MICROSCOPIC - Abnormal; Notable for the following components:   Color, Urine YELLOW (*)    APPearance TURBID (*)    Bilirubin Urine SMALL (*)    Ketones, ur 5 (*)    Protein, ur >=300 (*)    Leukocytes,Ua MODERATE (*)    Bacteria, UA MANY (*)    All other components within normal limits  LACTIC ACID, PLASMA - Abnormal; Notable for the following components:   Lactic Acid, Venous 2.9 (*)    All other components within normal limits  PROTIME-INR - Abnormal; Notable for the following components:   Prothrombin Time 15.9 (*)    All other components within normal limits  APTT - Abnormal; Notable for the following components:   aPTT 23 (*)    All other components within normal limits  BRAIN  NATRIURETIC PEPTIDE - Abnormal; Notable for the following components:   B Natriuretic Peptide 161.2 (*)    All other components within normal limits  CBC - Abnormal; Notable for the following components:   WBC 13.5 (*)    Hemoglobin 17.2 (*)    MCV 101.8 (*)    MCH 35.3 (*)    All other components within normal limits  CULTURE, BLOOD (ROUTINE X 2)  CULTURE, BLOOD (ROUTINE X 2)  URINE CULTURE  LIPASE, BLOOD  LACTIC ACID, PLASMA  BASIC METABOLIC PANEL WITH GFR  CBC  CBC  LACTIC ACID, PLASMA  LACTIC ACID, PLASMA  LACTIC ACID, PLASMA  TYPE AND SCREEN     EKG    RADIOLOGY Independently interpreted chest x-ray with no acute pathology.  Independently interpreted CT abdomen/pelvis showing evidence of small bowel obstruction   PROCEDURES:  Critical Care performed: Yes, see critical care procedure note(s)  .Critical Care  Performed by: Malvina Alm DASEN, MD Authorized by: Malvina Alm DASEN, MD   Critical care provider statement:    Critical care time (minutes):  30   Critical care was necessary to treat or prevent imminent or life-threatening deterioration of the following conditions:  Sepsis   Critical care was time spent personally by me on the following activities:  Development of treatment plan with patient or surrogate, discussions with consultants, evaluation of patient's response to treatment, examination of patient, ordering and review of laboratory studies, ordering and review of radiographic studies, ordering and performing treatments and interventions, pulse oximetry, re-evaluation of patient's condition and review of old charts   I assumed direction of critical care for this patient from another provider in my specialty: no     Care discussed with: admitting provider      MEDICATIONS ORDERED IN ED: Medications  ondansetron  (ZOFRAN ) injection 4 mg (has no administration in time range)  hydrALAZINE  (APRESOLINE ) injection 5 mg (has no administration in time range)   acetaminophen  (TYLENOL ) suppository 650 mg (has no administration in time range)  pantoprazole  (PROTONIX ) injection 40 mg (40 mg Intravenous Given 05/21/24 0020)  morphine  (PF) 2 MG/ML injection 1 mg (has no administration in time range)  oxyCODONE -acetaminophen  (PERCOCET/ROXICET) 5-325 MG per tablet 1 tablet (has no administration in time range)  thiamine  (VITAMIN B1) injection 100 mg (100 mg Intravenous Given 05/21/24 0020)  folic acid  (FOLVITE ) tablet 1 mg (has no administration in  time range)  Oral care mouth rinse (has no administration in time range)  0.9 %  sodium chloride  infusion ( Intravenous New Bag/Given 05/21/24 0027)  sodium chloride  0.9 % bolus 1,000 mL (0 mLs Intravenous Stopped 05/20/24 2000)  ondansetron  (ZOFRAN ) injection 4 mg (4 mg Intravenous Given 05/20/24 1649)  morphine  (PF) 4 MG/ML injection 4 mg (4 mg Intravenous Given 05/20/24 1648)  cefTRIAXone  (ROCEPHIN ) 2 g in sodium chloride  0.9 % 100 mL IVPB (0 g Intravenous Stopped 05/20/24 2000)  iohexol  (OMNIPAQUE ) 300 MG/ML solution 80 mL (80 mLs Intravenous Contrast Given 05/20/24 1853)  ondansetron  (ZOFRAN ) injection 4 mg (4 mg Intravenous Given 05/20/24 2224)  sodium chloride  0.9 % bolus 500 mL (500 mLs Intravenous New Bag/Given 05/21/24 0027)     IMPRESSION / MDM / ASSESSMENT AND PLAN / ED COURSE  I reviewed the triage vital signs and the nursing notes.                              Differential diagnosis includes, but is not limited to, SBO, partial SBO, diverticulitis, acute cystitis, sepsis, AKI  Patient's presentation is most consistent with acute presentation with potential threat to life or bodily function.  Patient is a 78 year old male presenting today for sharp generalized abdominal pain associated with nausea and vomiting x 4 days.  There was some concern his vomit may be darker but testing and on Hemoccult shows no blood in it does not appear black to me.  Mild tachycardia on arrival with tenderness throughout the abdomen  with concern for possible bowel obstruction given prior surgeries.  Will give 1 L fluids, morphine , and Zofran .  Collected basic laboratory workup as well as CT abdomen/pelvis and a UA.  Patient has leukocytosis and positive UA for UTI.  In the setting of tachycardia and infection meeting sepsis criteria so lactic was obtained along with blood cultures.  Patient started on ceftriaxone .  CT imaging of the abdomen/pelvis shows evidence of small bowel obstruction possibly due to adhesions.  NG tube placed.  General surgery consulted and agrees with NG tube and will continue to evaluate and monitor.  Patient admitted to hospitalist for management of small bowel obstruction as well as sepsis secondary to UTI.  He was reassessed with mild nausea and given Zofran .  The patient is on the cardiac monitor to evaluate for evidence of arrhythmia and/or significant heart rate changes. Clinical Course as of 05/21/24 0048  Mon May 20, 2024  1749 Urinalysis, Routine w reflex microscopic -Urine, Clean Catch(!) Positive UTI along with white blood cell count elevated meeting sepsis criteria.  Will give ceftriaxone  and collect blood cultures with lactic acid. [DW]  1814 Comprehensive metabolic panel(!) New AKI from baseline [DW]  2109 Dr. Cesar with general surgery recommends inpatient medicine. NG tube. [DW]    Clinical Course User Index [DW] Malvina Alm DASEN, MD     FINAL CLINICAL IMPRESSION(S) / ED DIAGNOSES   Final diagnoses:  Small bowel obstruction (HCC)  Acute cystitis with hematuria  Sepsis, due to unspecified organism, unspecified whether acute organ dysfunction present [facility])     Rx / DC Orders   ED Discharge Orders     None        Note:  This document was prepared using Dragon voice recognition software and may include unintentional dictation errors.   Malvina Alm DASEN, MD 05/21/24 [phone]

## 2024-05-20 NOTE — ED Triage Notes (Signed)
 Patient to ED via [facility] from home for coffee ground emesis and abd pain x4 day. States last BM 4 days ago.   Pt initially 89% RA- placed on 2 L [place].

## 2024-05-20 NOTE — ED Notes (Signed)
 Two attempts to place NG tube (one time in each nares). Due to vomiting pt unable to tolerate. Attempts unsuccessful. Pt requested a break at this time.

## 2024-05-20 NOTE — ED Notes (Addendum)
 Pt reporting to ED d/t coffee ground emesis and abdominal pain x4 days. Pt last BM 4 days ago. Pt was initially placed on 2 lpm via [place] for O2 sat of 89%. Pt now saturating at 96% on 2 lpm. Pt resting comfortably in bed and denies any current nausea. Pt ABCs intact. RR even and unlabored. Pt in NAD. Bed in lowest locked position. Call bell in reach. Denies needs at this time.

## 2024-05-20 NOTE — Sepsis Progress Note (Signed)
 Sepsis protocol is being followed by [facility].

## 2024-05-20 NOTE — H&P (Signed)
 History and Physical    Stephen [name] FMW:[id number] DOB: 1946-07-01 DOA: 05/20/2024  Referring MD/NP/PA:   PCP: Derick Leita POUR, MD   Patient coming from:  The patient is coming from home.     Chief Complaint: abdominal pain, abdominal distention frequent emesis, burning with urination.   HPI: Stephen [name] is a 78 y.o. 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  His abdominal pain is mainly in the upper abdomen, constant, mild to moderate, aching, nonradiating, not aggravated or alleviated by any known factors. He has nausea and multiple episodes of vomiting with coffee-ground emesis.  He still has small amount of output from colostomy.  No fever or chills.  Denies chest pain or SOB.  Patient has mild dry cough.  He uses condom catheter and reports burning on urination, no dysuria or hematuria.  Data reviewed independently and ED Course: pt was found to have WBC 13.9, lactic acid 1.4 --> 2.9, UA (turbid appearance, moderate amount leukocyte, many bacteria, WBC> 50, squamous cell 6/10), hemoglobin 17.8, AKI with creatinine 1.91, BUN 27 and GFR 36 (recent baseline creatinine 0.79 on 11/17/2022), temperature normal, blood pressure 134/97, heart rate 118, RR 17, oxygen saturation 94% on 2 L oxygen.  Chest x-ray negative.  Patient is admitted to telemetry bed as inpatient.  Dr. Cesar of surgery is consulted.  CT abdomen/pelvis:  IMPRESSION: 1. Findings consistent with small-bowel obstruction with 2 transition points within close vicinity of each other within the anterior pelvis,  possibly due to adhesions but given appearance, closed loop physiology cannot be excluded. There is some mesenteric vascular congestion within the pelvis. 2. Status post low anterior resection with left abdominal colostomy. Similar presacral soft tissue thickening. 3. Interim development of 14 mm indeterminate hypodense lesion within the right hepatic lobe. Given history of rectal cancer, recommend further evaluation with nonemergent MRI of the abdomen with and without contrast. 4. Gallstones. Small volume of air within the gallbladder and common bile duct, correlate for interval history of sphincterotomy. 5. 10 mm stone within the posterior bladder. Slightly thick-walled urinary bladder with mild mucosal enhancement, correlate with urinalysis to exclude cystitis. 6. 7 mm subpleural focus of nodularity or nodule at the left lung base. Short-term CT chest follow-up recommended to exclude developing pulmonary nodule. 7. Aortic atherosclerosis.   Aortic Atherosclerosis (ICD10-I70.0).   EKG:   Not done in ED, will get one.      Review of Systems:   General: no fevers, chills, no body weight gain, has poor appetite, has fatigue HEENT: no blurry vision, hearing changes or sore throat Respiratory: no dyspnea, has mild coughing, no wheezing CV: no chest pain, no palpitations GI: has nausea, vomiting, abdominal pain, no diarrhea, constipation GU: no dysuria, burning on urination, increased urinary frequency, hematuria  Ext: has trace leg edema Neuro: no unilateral weakness, numbness, or tingling, no vision change or hearing loss Skin: no rash, no skin tear. MSK: No muscle spasm, no deformity, no limitation of range of movement in spin Heme: No easy bruising.  Travel history: No recent long distant travel.   Allergy: No Known Allergies  Past Medical History:  Diagnosis Date   Allergic rhinitis  Blood clot in vein 2015   left leg    History of anemia    History of chicken pox     HTN (hypertension)    Neuropathy    arms and legs - S/P Cancer tx   Rectal cancer metastasized to lung [facility]) 09-2013   surgery and chemo   Smoker     Past Surgical History:  Procedure Laterality Date   COLON RESECTION  2015   COLONOSCOPY  2015   ENDOSCOPIC RETROGRADE CHOLANGIOPANCREATOGRAPHY (ERCP) WITH PROPOFOL  N/A 11/10/2022   Procedure: ENDOSCOPIC RETROGRADE CHOLANGIOPANCREATOGRAPHY (ERCP) WITH PROPOFOL ;  Surgeon: Abran Norleen SAILOR, MD;  Location: [facility] ENDOSCOPY;  Service: Gastroenterology;  Laterality: N/A;   ERCP N/A 11/13/2022   Procedure: ENDOSCOPIC RETROGRADE CHOLANGIOPANCREATOGRAPHY (ERCP);  Surgeon: Abran Norleen SAILOR, MD;  Location: [facility] ENDOSCOPY;  Service: Gastroenterology;  Laterality: N/A;   ESOPHAGOGASTRODUODENOSCOPY N/A 11/13/2022   Procedure: ESOPHAGOGASTRODUODENOSCOPY (EGD);  Surgeon: Abran Norleen SAILOR, MD;  Location: [facility] ENDOSCOPY;  Service: Gastroenterology;  Laterality: N/A;   EUS N/A 11/15/2013   Procedure: LOWER ENDOSCOPIC ULTRASOUND (EUS);  Surgeon: Toribio SHAUNNA Cedar, MD;  Location: [facility];  Service: Endoscopy;  Laterality: N/A;   LUNG BIOPSY     LUNG LOBECTOMY Left    lung removed  2015   left lower    PLANTAR FASCIA RELEASE Right 09/20/2017   Procedure: PLANTAR FASCIA [id number], excision plantar fibroma right foot;  Surgeon: Ashley Soulier, DPM;  Location: [facility];  Service: Podiatry;  Laterality: Right;   REMOVAL OF STONES  11/13/2022   Procedure: REMOVAL OF STONES;  Surgeon: Abran Norleen SAILOR, MD;  Location: [facility] ENDOSCOPY;  Service: Gastroenterology;;   ANNETT  11/13/2022   Procedure: ANNETT;  Surgeon: Abran Norleen SAILOR, MD;  Location: [facility] ENDOSCOPY;  Service: Gastroenterology;;   TONSILLECTOMY AND ADENOIDECTOMY  Childhood   VARICOSE VEIN SURGERY  2010    Social History:  reports that he quit smoking about 3 years ago. His smoking use included cigarettes. He started smoking about 18 years ago. He has a 7.5 pack-year smoking history. He has never  used smokeless tobacco. He reports current alcohol use of about 18.0 standard drinks of alcohol per week. He reports that he does not use drugs.  Family History:  Family History  Problem Relation Age of Onset   Hypertension Mother    Coronary artery disease Mother        angina   Cancer Mother        breast   Cancer Father 29       esophageal   COPD Brother        emphysema   Cancer Paternal Grandmother        colon   Diabetes Neg Hx      Prior to Admission medications   Medication Sig Start Date End Date Taking? Authorizing Provider  esomeprazole  (NEXIUM ) 20 MG packet Take 20 mg by mouth daily before breakfast.    [provider]  hydrOXYzine (ATARAX) 10 MG tablet Take 10 mg by mouth 3 (three) times daily as needed for itching (allergies). 10/20/22   [provider]  MAGNESIUM  PO Take 1 tablet by mouth daily.    [provider]  Multiple Vitamin (MULTIVITAMIN WITH MINERALS) TABS tablet Take 1 tablet by mouth daily. 02/25/21   Krishnan, Gokul, MD  Omega-3 Fatty Acids (OMEGA 3 PO) Take 1 tablet by mouth daily.    [provider]  polyethylene glycol (MIRALAX  / GLYCOLAX ) 17 g packet Take 17 g by mouth daily  as needed for moderate constipation.    [provider]  vitamin B-12 (CYANOCOBALAMIN ) 100 MCG tablet Take 100 mcg by mouth daily.    [provider]  vitamin C (ASCORBIC ACID ) 250 MG tablet Take 250 mg by mouth daily.    [provider]  Vitamin D , Cholecalciferol , 25 MCG (1000 UT) TABS Take 1,000 Units by mouth daily.    [provider]    Physical Exam: Vitals:   05/20/24 1607 05/20/24 1614 05/20/24 1615 05/20/24 2145  BP: (!) (P) 140/96  (!) 134/97   Pulse: (!) (P) 123  (!) 118 (!) 106  Resp: (P) 18  17 11   Temp: (P) 98.1 F (36.7 C)  98.2 F (36.8 C)   TempSrc: (P) Oral  Oral   SpO2:   94% 93%  Weight:  84.8 kg    Height:  5' 7 (1.702 m)     General: Not in acute distress HEENT:       Eyes:  PERRL, EOMI, no jaundice       ENT: No discharge from the ears and nose, no pharynx injection, no tonsillar enlargement.        Neck: No JVD, no bruit, no mass felt. Heme: No neck lymph node enlargement. Cardiac: S1/S2, RRR, No murmurs, No gallops or rubs. Respiratory: No rales, wheezing, rhonchi or rubs. GI: Moderately distended abdomen, with diffuse tenderness, no organomegaly, BS present. GU: No hematuria Ext: has trace leg edema bilaterally. 1+DP/PT pulse bilaterally. Musculoskeletal: No joint deformities, No joint redness or warmth, no limitation of ROM in spin. Skin: No rashes.  Neuro: Alert, oriented X3, cranial nerves II-XII grossly intact, moves all extremities normally.  Psych: Patient is not psychotic, no suicidal or hemocidal ideation.  Labs on Admission: I have personally reviewed following labs and imaging studies  CBC: Recent Labs  Lab 05/20/24 1715  WBC 13.9*  HGB 17.8*  HCT 52.0  MCV 102.2*  PLT 246   Basic Metabolic Panel: Recent Labs  Lab 05/20/24 1715  NA 141  K 3.6  CL 99  CO2 25  GLUCOSE 154*  BUN 27*  CREATININE 1.91*  CALCIUM 9.9   GFR: Estimated Creatinine Clearance: 33.7 mL/min (A) (by C-G formula based on SCr of 1.91 mg/dL (H)). Liver Function Tests: Recent Labs  Lab 05/20/24 1715  AST 34  ALT 16  ALKPHOS 99  BILITOT 2.4*  PROT 8.2*  ALBUMIN 3.5   Recent Labs  Lab 05/20/24 1715  LIPASE 26   No results for input(s): AMMONIA in the last 168 hours. Coagulation Profile: No results for input(s): INR, PROTIME in the last 168 hours. Cardiac Enzymes: No results for input(s): CKTOTAL, CKMB, CKMBINDEX, TROPONINI in the last 168 hours. BNP (last 3 results) No results for input(s): PROBNP in the last 8760 hours. HbA1C: No results for input(s): HGBA1C in the last 72 hours. CBG: No results for input(s): GLUCAP in the last 168 hours. Lipid Profile: No results for input(s): CHOL, HDL, LDLCALC, TRIG, CHOLHDL,  LDLDIRECT in the last 72 hours. Thyroid Function Tests: No results for input(s): TSH, T4TOTAL, FREET4, T3FREE, THYROIDAB in the last 72 hours. Anemia Panel: No results for input(s): VITAMINB12, FOLATE, FERRITIN, TIBC, IRON, RETICCTPCT in the last 72 hours. Urine analysis:    Component Value Date/Time   COLORURINE YELLOW (A) 05/20/2024 1720   APPEARANCEUR TURBID (A) 05/20/2024 1720   APPEARANCEUR Cloudy 04/28/2014 0900   LABSPEC 1.027 05/20/2024 1720   LABSPEC 1.023 04/28/2014 0900   PHURINE 7.0 05/20/2024 1720  GLUCOSEU NEGATIVE 05/20/2024 1720   GLUCOSEU Negative 04/28/2014 0900   HGBUR NEGATIVE 05/20/2024 1720   BILIRUBINUR SMALL (A) 05/20/2024 1720   BILIRUBINUR Negative 04/28/2014 0900   KETONESUR 5 (A) 05/20/2024 1720   PROTEINUR >=300 (A) 05/20/2024 1720   NITRITE NEGATIVE 05/20/2024 1720   LEUKOCYTESUR MODERATE (A) 05/20/2024 1720   LEUKOCYTESUR Negative 04/28/2014 0900   Sepsis Labs: @LABRCNTIP (procalcitonin:4,lacticidven:4) )No results found for this or any previous visit (from the past 240 hours).   Radiological Exams on Admission:   Assessment/Plan Principal Problem:   SBO (small bowel obstruction) (HCC) Active Problems:   UTI (urinary tract infection)   AKI (acute kidney injury) (HCC)   HTN (hypertension)   Chronic diastolic CHF (congestive heart failure) (HCC)   Rectal cancer (HCC)   Liver lesion   Coffee ground emesis   Alcohol use   Overweight (BMI 25.0-29.9)   Assessment and Plan:  SBO (small bowel obstruction) (HCC): CT findings are consistent with small-bowel obstruction with 2 transition points within close vicinity of each other within the anterior pelvis, possibly due to adhesions but given appearance, closed loop physiology cannot be excluded per radiologist. There is some mesenteric vascular congestion within the pelvis.  EDP consulted Dr. Cesar for surgery.  -Admit to med surg bed -NPO   -NG tube  -morphine  and  percocet prn pain -Prn Zofran  prn nausea   -IVF: 1.5L of NS in ED, then NS 75 cc/h -INR/PTT/type & screen -Follow-up general surgeons recommendation  UTI (urinary tract infection): - Rocephin  - Follow-up of blood culture and urine culture  AKI (acute kidney injury) (HCC): Likely due to dehydration and UTI. -IV fluid as above - Avoid using renal toxic medications.  HTN (hypertension): Blood pressure 134/97, patient is not taking medications currently. - IV hydralazine  as needed  Chronic diastolic CHF (congestive heart failure) (HCC): 2D echo on 11/09/2022 showed EF 60 to 65% with grade 1 diastolic dysfunction.  Patient has trace leg edema, but no pulm edema chest x-ray, does not seem to have CHF exacerbation. 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 Today CT scanning showed interim development of 14 mm indeterminate hypodense lesion within the right hepatic lobe; also showed 7 mm subpleural focus of nodularity or nodule at the left lung base. -these need to be followed up with PCP and oncologist after acute SBO issue is over  Liver lesion -see above  Coffee ground emesis: Hemoglobin stable 17.8. - Start IV pantoprazole  40 mg bid - Zofran  IV for nausea - Avoid NSAIDs and SQ heparin  - Maintain IV access (2 large bore IVs if possible). - Monitor closely and follow q8h cbc, transfuse as necessary, if Hgb<7.0 - LaB: INR, PTT and type screen  Alcohol use: Patient states that he drinks diluted whiskey every day, last drink was 4 days ago, currently no signs of withdrawal. - Watch any signs for withdrawal closely - Give vitamin B1 and Folic acid   Overweight (BMI 25.0-29.9): Body weight 84.8 kg, BMI 29.29 - Encourage losing weight - Exercise healthy diet     DVT ppx: SCd  Code Status: DNR per pt (patient has DNR form with  him)  Family Communication:     not done, no family member is at bed side.      Disposition Plan:  Anticipate discharge back to previous environment  Consults called:  Dr.  Cintron of surgery is consulted.  Admission status and Level of care: Telemetry Medical:  as inpt        Dispo: The patient is from: Home              Anticipated d/c is to: Home              Anticipated d/c date is: 2 days              Patient currently is not medically stable to d/c.    Severity of Illness:  The appropriate patient status for this patient is INPATIENT. Inpatient status is judged to be reasonable and necessary in order to provide the required intensity of service to ensure the patient's safety. The patient's presenting symptoms, physical exam findings, and initial radiographic and laboratory data in the context of their chronic comorbidities is felt to place them at high risk for further clinical deterioration. Furthermore, it is not anticipated that the patient will be medically stable for discharge from the hospital within 2 midnights of admission.   * I certify that at the point of admission it is my clinical judgment that the patient will require inpatient hospital care spanning beyond 2 midnights from the point of admission due to high intensity of service, high risk for further deterioration and high frequency of surveillance required.*       Date of Service 05/20/2024    Caleb Exon [facility]   If 7PM-7AM, please contact night-coverage [email or web address] 05/20/2024, 10:25 PM

## 2024-05-20 NOTE — Sepsis Progress Note (Signed)
 Notified provider via secure chat of need to order repeat lactic acid. Second lactic acid 2.9.

## 2024-05-21 ENCOUNTER — Inpatient Hospital Stay

## 2024-05-21 DIAGNOSIS — K56609 Unspecified intestinal obstruction, unspecified as to partial versus complete obstruction: Secondary | ICD-10-CM

## 2024-05-21 LAB — LACTIC ACID, PLASMA
Lactic Acid, Venous: 2.3 mmol/L (ref 0.5–1.9)
Lactic Acid, Venous: 1.8 mmol/L (ref 0.5–1.9)
Lactic Acid, Venous: 1.6 mmol/L (ref 0.5–1.9)

## 2024-05-21 LAB — CBC
HCT: 45.7 % (ref 39.0–52.0)
Hemoglobin: 15.5 g/dL (ref 13.0–17.0)
MCH: 35.5 pg — ABNORMAL HIGH (ref 26.0–34.0)
MCHC: 33.9 g/dL (ref 30.0–36.0)
MCV: 104.6 fL — ABNORMAL HIGH (ref 80.0–100.0)
Platelets: 194 K/uL (ref 150–400)
RBC: 4.37 MIL/uL (ref 4.22–5.81)
RDW: 14.5 % (ref 11.5–15.5)
WBC: 13.7 K/uL — ABNORMAL HIGH (ref 4.0–10.5)
nRBC: 0 % (ref 0.0–0.2)

## 2024-05-21 LAB — BASIC METABOLIC PANEL WITH GFR
Anion gap: 12 (ref 5–15)
BUN: 27 mg/dL — ABNORMAL HIGH (ref 8–23)
CO2: 29 mmol/L (ref 22–32)
Calcium: 9.2 mg/dL (ref 8.9–10.3)
Chloride: 102 mmol/L (ref 98–111)
Creatinine, Ser: 1.44 mg/dL — ABNORMAL HIGH (ref 0.61–1.24)
GFR, Estimated: 50 mL/min — ABNORMAL LOW (ref >60)
Glucose, Bld: 131 mg/dL — ABNORMAL HIGH (ref 70–99)
Potassium: 4.1 mmol/L (ref 3.5–5.1)
Sodium: 143 mmol/L (ref 135–145)

## 2024-05-21 LAB — TYPE AND SCREEN

## 2024-05-21 LAB — GLUCOSE, CAPILLARY: Glucose-Capillary: 111 mg/dL — ABNORMAL HIGH (ref 70–99)

## 2024-05-21 MED ORDER — DIATRIZOATE MEGLUMINE & SODIUM 66-10 % PO SOLN
90.0000 mL | Freq: Once | ORAL | Status: AC
  Administered 2024-05-21: 90 mL via NASOGASTRIC

## 2024-05-21 MED ORDER — SODIUM CHLORIDE 0.9 % IV SOLN
1.0000 g | Freq: Once | INTRAVENOUS | Status: AC
  Administered 2024-05-21: 1 g via INTRAVENOUS
  Filled 2024-05-21: qty 10

## 2024-05-21 NOTE — Consult Note (Signed)
 [facility]-General Surgery  SURGICAL CONSULTATION NOTE    HISTORY OF PRESENT ILLNESS (HPI):  78 y.o. male presented to [facility] ED today for evaluation of abdominal pain and vomiting. Patient reports symptoms started about four days ago. He states he noted possible blood in vomit but unsure. Has not been able to eat or drink anything since. Has not noticed an output in colostomy bag. Describes the pain as non-radiating. No known alleviating or aggravating factors. 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    At the initial encounter in the ED, patient was hypertensive with BP of 134/97, tachycardiac with HR of 118, RR of 17, and afebrile with a temp of 98.57F (36.8 C). Labs showed leukocytosis with WBC of 13.9, Hbg of 17.8, elevated BUN of 27 and creatinine of 1.91. Normal LFTs and lipase. Total bilirubin elevated to 2.4. Positive UA for UTI. Sepsis protocol was in place given leukocytosis, tachycardiac and infection. Lactic acid was 2.9 but now trending down.  CT of abdomen and pelvis showed 2 transition points within the anterior pelvis consistent with small bowel obstruction, likely due to adhesions but closed loop physiology could not be ruled out.   Surgery is consulted by Dr. Well in this context for evaluation and management of small bowel obstruction.   PAST MEDICAL HISTORY (PMH):  Past Medical History:  Diagnosis Date   Allergic rhinitis    Blood clot in vein 2015   left leg    History of anemia    History of chicken pox    HTN (hypertension)    Neuropathy    arms and legs - S/P Cancer tx   Rectal cancer metastasized to lung [facility]) 09-2013   surgery and chemo   Smoker      PAST SURGICAL HISTORY (PSH):  Past Surgical History:  Procedure Laterality Date   COLON RESECTION  2015   COLONOSCOPY  2015   ENDOSCOPIC RETROGRADE CHOLANGIOPANCREATOGRAPHY (ERCP) WITH PROPOFOL  N/A 11/10/2022   Procedure: ENDOSCOPIC  RETROGRADE CHOLANGIOPANCREATOGRAPHY (ERCP) WITH PROPOFOL ;  Surgeon: Abran Norleen SAILOR, MD;  Location: [facility] ENDOSCOPY;  Service: Gastroenterology;  Laterality: N/A;   ERCP N/A 11/13/2022   Procedure: ENDOSCOPIC RETROGRADE CHOLANGIOPANCREATOGRAPHY (ERCP);  Surgeon: Abran Norleen SAILOR, MD;  Location: [facility] ENDOSCOPY;  Service: Gastroenterology;  Laterality: N/A;   ESOPHAGOGASTRODUODENOSCOPY N/A 11/13/2022   Procedure: ESOPHAGOGASTRODUODENOSCOPY (EGD);  Surgeon: Abran Norleen SAILOR, MD;  Location: [facility] ENDOSCOPY;  Service: Gastroenterology;  Laterality: N/A;   EUS N/A 11/15/2013   Procedure: LOWER ENDOSCOPIC ULTRASOUND (EUS);  Surgeon: Toribio SHAUNNA Cedar, MD;  Location: [facility];  Service: Endoscopy;  Laterality: N/A;   LUNG BIOPSY     LUNG LOBECTOMY Left    lung removed  2015   left lower    PLANTAR FASCIA RELEASE Right 09/20/2017   Procedure: PLANTAR FASCIA [id number], excision plantar fibroma right foot;  Surgeon: Ashley Soulier, DPM;  Location: [facility];  Service: Podiatry;  Laterality: Right;   REMOVAL OF STONES  11/13/2022   Procedure: REMOVAL OF STONES;  Surgeon: Abran Norleen SAILOR, MD;  Location: [facility] ENDOSCOPY;  Service: Gastroenterology;;   ANNETT  11/13/2022   Procedure: ANNETT;  Surgeon: Abran Norleen SAILOR, MD;  Location: [facility] ENDOSCOPY;  Service: Gastroenterology;;   TONSILLECTOMY AND ADENOIDECTOMY  Childhood   VARICOSE VEIN SURGERY  2010     MEDICATIONS:  Prior to Admission medications   Medication Sig Start Date End Date Taking? Authorizing Provider  esomeprazole  (NEXIUM ) 20 MG packet Take  20 mg by mouth daily before breakfast.    [provider]  hydrOXYzine (ATARAX) 10 MG tablet Take 10 mg by mouth 3 (three) times daily as needed for itching (allergies). 10/20/22   [provider]  MAGNESIUM  PO Take 1 tablet by mouth daily.    [provider]  Multiple Vitamin (MULTIVITAMIN WITH MINERALS) TABS tablet Take 1 tablet by mouth daily. 02/25/21   Krishnan, Gokul, MD   Omega-3 Fatty Acids (OMEGA 3 PO) Take 1 tablet by mouth daily.    [provider]  polyethylene glycol (MIRALAX  / GLYCOLAX ) 17 g packet Take 17 g by mouth daily as needed for moderate constipation.    [provider]  vitamin B-12 (CYANOCOBALAMIN ) 100 MCG tablet Take 100 mcg by mouth daily.    [provider]  vitamin C (ASCORBIC ACID ) 250 MG tablet Take 250 mg by mouth daily.    [provider]  Vitamin D , Cholecalciferol , 25 MCG (1000 UT) TABS Take 1,000 Units by mouth daily.    [provider]     ALLERGIES:  No Known Allergies   SOCIAL HISTORY:  Social History   Socioeconomic History   Marital status: Married    Spouse name: Not on file   Number of children: Not on file   Years of education: Not on file   Highest education level: Not on file  Occupational History   Occupation: retired  Tobacco Use   Smoking status: Former    Current packs/day: 0.00    Average packs/day: 0.5 packs/day for 15.0 years (7.5 ttl pk-yrs)    Types: Cigarettes    Start date: 11/14/2005    Quit date: 11/14/2020    Years since quitting: 3.5   Smokeless tobacco: Never  Vaping Use   Vaping status: Never Used  Substance and Sexual Activity   Alcohol use: Yes    Alcohol/week: 18.0 standard drinks of alcohol    Types: 18 Shots of liquor per week    Comment: Regular 2-3 scotch/day   Drug use: No   Sexual activity: Not on file  Other Topics Concern   Not on file  Social History Narrative   Caffeine: 5-6 cups coffee/day   Lives with wife Synthia), son and step son, no pets   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)   Edu: Brewing technologist   Activity: works outside   Diet: good water, daily fruits/vegetables   Social Drivers of Corporate investment banker Strain: Not on file  Food Insecurity: No Food Insecurity (05/21/2024)   Hunger Vital Sign    Worried About Running Out of Food in the Last Year: Never true    Ran Out of  Food in the Last Year: Never true  Transportation Needs: No Transportation Needs (05/21/2024)   PRAPARE - Administrator, Civil Service (Medical): No    Lack of Transportation (Non-Medical): No  Physical Activity: Not on file  Stress: Not on file  Social Connections: Moderately Integrated (05/21/2024)   Social Connection and Isolation Panel    Frequency of Communication with Friends and Family: Three times a week    Frequency of Social Gatherings with Friends and Family: Three times a week    Attends Religious Services: 1 to 4 times per year    Active Member of Clubs or Organizations: No    Attends Banker Meetings: Never    Marital Status: Married  Catering manager Violence: Not At Risk (05/21/2024)   Humiliation, Afraid,  Rape, and Kick questionnaire    Fear of Current or Ex-Partner: No    Emotionally Abused: No    Physically Abused: No    Sexually Abused: No     FAMILY HISTORY:  Family History  Problem Relation Age of Onset   Hypertension Mother    Coronary artery disease Mother        angina   Cancer Mother        breast   Cancer Father 51       esophageal   COPD Brother        emphysema   Cancer Paternal Grandmother        colon   Diabetes Neg Hx       REVIEW OF SYSTEMS:  Review of Systems  Constitutional:  Negative for chills and fever.  Respiratory:  Negative for shortness of breath and wheezing.   Cardiovascular:  Negative for chest pain and palpitations.  Gastrointestinal:  Positive for abdominal pain, nausea and vomiting.    VITAL SIGNS:  Temp:  [97.5 F (36.4 C)-98.2 F (36.8 C)] 98.2 F (36.8 C) (07/08 0737) Pulse Rate:  [84-125] 84 (07/08 0737) Resp:  [11-20] 18 (07/08 0737) BP: (117-134)/(72-97) 117/73 (07/08 0737) SpO2:  [91 %-98 %] 95 % (07/08 0737) Weight:  [83.3 kg-84.8 kg] 83.3 kg (07/08 0500)     Height: 5' 7 (170.2 cm) Weight: 83.3 kg BMI (Calculated): 28.76   INTAKE/OUTPUT:  07/07 0701 - 07/08 0700 In: -  Out: 650  [Emesis/NG output:650]  PHYSICAL EXAM:  Physical Exam Constitutional:      Appearance: Normal appearance.  HENT:     Head: Normocephalic and atraumatic.  Cardiovascular:     Rate and Rhythm: Normal rate and regular rhythm.  Pulmonary:     Effort: Pulmonary effort is normal.     Breath sounds: Normal breath sounds.  Abdominal:     General: There is distension.     Palpations: Abdomen is soft.     Tenderness: There is abdominal tenderness. There is no guarding or rebound.  Neurological:     Mental Status: He is alert.      Labs:     Latest Ref Rng & Units 05/21/2024    4:59 AM 05/20/2024   10:58 PM 05/20/2024    5:15 PM  CBC  WBC 4.0 - 10.5 K/uL 13.7  13.5  13.9   Hemoglobin 13.0 - 17.0 g/dL 84.4  82.7  82.1   Hematocrit 39.0 - 52.0 % 45.7  49.6  52.0   Platelets 150 - 400 K/uL 194  227  246       Latest Ref Rng & Units 05/21/2024    4:59 AM 05/20/2024    5:15 PM 11/17/2022    3:13 AM  CMP  Glucose 70 - 99 mg/dL 868  845  92   BUN 8 - 23 mg/dL 27  27  <5   Creatinine 0.61 - 1.24 mg/dL 8.55  8.08  9.20   Sodium 135 - 145 mmol/L 143  141  134   Potassium 3.5 - 5.1 mmol/L 4.1  3.6  3.6   Chloride 98 - 111 mmol/L 102  99  97   CO2 22 - 32 mmol/L 29  25  28    Calcium 8.9 - 10.3 mg/dL 9.2  9.9  7.9   Total Protein 6.5 - 8.1 g/dL  8.2  5.8   Total Bilirubin 0.0 - 1.2 mg/dL  2.4  2.0   Alkaline Phos 38 - 126  U/L  99  99   AST 15 - 41 U/L  34  43   ALT 0 - 44 U/L  16  33     Imaging studies:   EXAM: 05/20/24 CT ABDOMEN AND PELVIS WITH CONTRAST   TECHNIQUE: Multidetector CT imaging of the abdomen and pelvis was performed using the standard protocol following bolus administration of intravenous contrast.   RADIATION DOSE REDUCTION: This exam was performed according to the departmental dose-optimization program which includes automated exposure control, adjustment of the mA and/or kV according to patient size and/or use of iterative reconstruction technique.   CONTRAST:   80mL OMNIPAQUE  IOHEXOL  300 MG/ML  SOLN   COMPARISON:  CT 11/13/2022, 11/08/2022, 02/19/2021, chest CT 11/10/2022   FINDINGS: Lower chest: Lung bases demonstrate mild subpleural reticulation and dependent atelectasis. 7 mm subpleural focus of nodularity or nodule left lung base, series 4, image 4. Coronary vascular calcification   Hepatobiliary: Possible subtle surface nodularity of liver. Subcentimeter hypodensities too small to further characterize. Interim development of indeterminate hypodense lesion within the right hepatic lobe, 14 mm on series 2, image 12. Gallstones. Small volume of air within the gallbladder and common bile duct, correlate for interval history of sphincterotomy   Pancreas: Unremarkable. No pancreatic ductal dilatation or surrounding inflammatory changes.   Spleen: Normal in size without focal abnormality.   Adrenals/Urinary Tract: Stable adrenal glands. No hydronephrosis. Renal cysts for which no imaging follow-up is recommended. Slightly thick-walled urinary bladder with mild mucosal enhancement. 10 mm stone within the posterior bladder.   Stomach/Bowel: Moderate fluid distension of the stomach. Multiple dilated fluid-filled loops of small bowel measuring up to 3.8 cm consistent with a bowel obstruction. Apparent 2 transition points within close vicinity of each other within the anterior pelvis, series 2, image 53 and series 2, image 60. Some mesenteric congestion in the pelvis. No acute bowel wall thickening. Negative appendix. Status post low anterior resection with left abdominal colostomy.   Vascular/Lymphatic: Advanced aortic atherosclerosis. No aneurysm. No suspicious lymph nodes.   Reproductive: Negative prostate   Other: Negative for free air. Similar presacral soft tissue thickening. No significant ascites.   Musculoskeletal: Chronic compression fractures at L4 and L5 and superior endplate of L1   IMPRESSION: 1. Findings consistent  with small-bowel obstruction with 2 transition points within close vicinity of each other within the anterior pelvis, possibly due to adhesions but given appearance, closed loop physiology cannot be excluded. There is some mesenteric vascular congestion within the pelvis. 2. Status post low anterior resection with left abdominal colostomy. Similar presacral soft tissue thickening. 3. Interim development of 14 mm indeterminate hypodense lesion within the right hepatic lobe. Given history of rectal cancer, recommend further evaluation with nonemergent MRI of the abdomen with and without contrast. 4. Gallstones. Small volume of air within the gallbladder and common bile duct, correlate for interval history of sphincterotomy. 5. 10 mm stone within the posterior bladder. Slightly thick-walled urinary bladder with mild mucosal enhancement, correlate with urinalysis to exclude cystitis. 6. 7 mm subpleural focus of nodularity or nodule at the left lung base. Short-term CT chest follow-up recommended to exclude developing pulmonary nodule. 7. Aortic atherosclerosis.   Assessment/Plan: 78 y.o. male with small bowel obstruction, complicated by pertinent comorbidities including HFpEF, alcohol use, metastatic rectal cancer with Foley and colostomy, HTN, AKI, and UTI .   - No fever, not tachycardiac with WBC of 13.7  - Continue conservative management for SBO with NG tube, NPO and IV fluids   -  Start Gastrografin  protocol , will reevaluate management in 24 hrs pending abdominal x-rays.   - Continue pain management and DVT prophylaxis  Thank you for the opportunity to participate in this patient's care.   -- Gilmer Cea PA-C

## 2024-05-21 NOTE — Plan of Care (Signed)

## 2024-05-21 NOTE — Progress Notes (Addendum)
 PROGRESS NOTE    Stephen [name]  FMW:[id number] DOB: 1945-12-03 DOA: 05/20/2024 PCP: Derick Leita POUR, MD  Outpatient Specialists: oncology    Brief Narrative:   From admission h and p  Stephen [name] is a 78 y.o. 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  His abdominal pain is mainly in the upper abdomen, constant, mild to moderate, aching, nonradiating, not aggravated or alleviated by any known factors. He has nausea and multiple episodes of vomiting with coffee-ground emesis.  He still has small amount of output from colostomy.  No fever or chills.  Denies chest pain or SOB.  Patient has mild dry cough.  He uses condom catheter and reports burning on urination, no dysuria or hematuria.  Assessment & Plan:   Principal Problem:   SBO (small bowel obstruction) (HCC) Active Problems:   UTI (urinary tract infection)   AKI (acute kidney injury) (HCC)   HTN (hypertension)   Chronic diastolic CHF (congestive heart failure) (HCC)   Rectal cancer (HCC)   Liver lesion   Coffee ground emesis   Alcohol use   Overweight (BMI 25.0-29.9)   Malnutrition of moderate degree  # SBO Presumably mechanical from prior low anterior resection. CT scan showed no serious complications - gen surg following, recs appreciated - NG tube - gastrograffin challenge today per gen surg  # History rectal cancer # Colostomy status With isolated met to left lung, s/p resection, adjuvant chemo. Last oncology visit in 2022, lost to f/u, wife says can't transport him to doctor appointments. Here CT of  abdomen/pelvis shows left lung base nodule as well as liver nodule.  - consider obtaining MRI of liver and CT of chest prior to discharge  # AKI # Lactic acidosis Likely prerenal 2/2 above process. Both improving with fluids.  - continue fluids for now  # Acute cystitis Ua suggestive of possible infection and does endorse recent dysuria - continue ceftriaxone  - f/u urine culture  # Bedbound Wife says this way last 5 years  # HTN Bp normal, doesn't appear to be on meds at home  # Alcohol use Few drinks a day, denies withdrawal history - monitor   DVT prophylaxis: scds Code Status: dnr/dni Family Communication: wife dora updated telephonically 7/8  Level of care: Telemetry Medical Status is: Inpatient Remains inpatient appropriate because: severity of illness    Consultants:  Gen surg  Procedures: NG tube placement  Antimicrobials:  none    Subjective: Reports feeling much better  Objective: Vitals:   05/20/24 2249 05/21/24 0403 05/21/24 0500 05/21/24 0737  BP: 129/84 131/72  117/73  Pulse: (!) 125 94  84  Resp: 20 20  18   Temp: 97.9 F (36.6 C) (!) 97.5 F (36.4 C)  98.2 F (36.8 C)  TempSrc: Oral Oral    SpO2: 91% 98%  95%  Weight:   83.3 kg   Height:        Intake/Output Summary (Last 24 hours) at 05/21/2024 1026 Last data filed at 05/21/2024 0915 Gross per 24 hour  Intake 1112.6 ml  Output 650 ml  Net 462.6 ml   Filed Weights   05/20/24 1614 05/21/24 0500  Weight: 84.8 kg 83.3 kg    Examination:  General exam: Appears calm and comfortable  Respiratory system: Clear to auscultation. Respiratory effort normal. Cardiovascular system: S1 & S2 heard, RRR.  Gastrointestinal system: Abdomen is distended, soft and nontender. Decreased bowel sounds. Ostomy bag present, nothing in it Central nervous system: Alert and oriented. No focal neurological deficits. Extremities: warm. Decreased power and muscle mass in LEs Skin: No rashes, lesions or  ulcers Psychiatry: Judgement and insight appear normal. Mood & affect appropriate.     Data Reviewed: I have personally reviewed following labs and imaging studies  CBC: Recent Labs  Lab 05/20/24 1715 05/20/24 2258 05/21/24 0459  WBC 13.9* 13.5* 13.7*  HGB 17.8* 17.2* 15.5  HCT 52.0 49.6 45.7  MCV 102.2* 101.8* 104.6*  PLT 246 227 194   Basic Metabolic Panel: Recent Labs  Lab 05/20/24 1715 05/21/24 0459  NA 141 143  K 3.6 4.1  CL 99 102  CO2 25 29  GLUCOSE 154* 131*  BUN 27* 27*  CREATININE 1.91* 1.44*  CALCIUM 9.9 9.2   GFR: Estimated Creatinine Clearance: 44.4 mL/min (A) (by C-G formula based on SCr of 1.44 mg/dL (H)). Liver Function Tests: Recent Labs  Lab 05/20/24 1715  AST 34  ALT 16  ALKPHOS 99  BILITOT 2.4*  PROT 8.2*  ALBUMIN 3.5   Recent Labs  Lab 05/20/24 1715  LIPASE 26   No results for input(s): AMMONIA in the last 168 hours. Coagulation Profile: Recent Labs  Lab 05/20/24 2258  INR 1.2   Cardiac Enzymes: No results for input(s): CKTOTAL, CKMB, CKMBINDEX, TROPONINI in the last 168 hours. BNP (last 3 results) No results for input(s): PROBNP in the last 8760 hours. HbA1C: No results for input(s): HGBA1C in the last 72 hours. CBG: Recent Labs  Lab 05/21/24 0739  GLUCAP 111*   Lipid Profile: No results for input(s): CHOL, HDL, LDLCALC, TRIG, CHOLHDL, LDLDIRECT in the last 72 hours. Thyroid Function Tests: No results for input(s): TSH, T4TOTAL, FREET4, T3FREE, THYROIDAB in the last 72 hours. Anemia Panel: No results for input(s): VITAMINB12, FOLATE, FERRITIN, TIBC, IRON, RETICCTPCT in the last 72 hours. Urine analysis:    Component Value Date/Time   COLORURINE YELLOW (A) 05/20/2024 1720   APPEARANCEUR TURBID (A) 05/20/2024 1720   APPEARANCEUR Cloudy 04/28/2014 0900   LABSPEC 1.027 05/20/2024 1720   LABSPEC 1.023 04/28/2014 0900   PHURINE 7.0 05/20/2024 1720   GLUCOSEU NEGATIVE  05/20/2024 1720   GLUCOSEU Negative 04/28/2014 0900   HGBUR NEGATIVE 05/20/2024 1720   BILIRUBINUR SMALL (A) 05/20/2024 1720   BILIRUBINUR Negative 04/28/2014 0900   KETONESUR 5 (A) 05/20/2024 1720   PROTEINUR >=300 (A) 05/20/2024 1720   NITRITE NEGATIVE 05/20/2024 1720   LEUKOCYTESUR MODERATE (A) 05/20/2024 1720   LEUKOCYTESUR Negative 04/28/2014 0900   Sepsis Labs: @LABRCNTIP (procalcitonin:4,lacticidven:4)  ) Recent Results (from the past 240 hours)  Blood culture (routine x 2)     Status: None (Preliminary result)   Collection Time: 05/20/24  6:35 PM   Specimen: BLOOD  Result Value Ref Range Status   Specimen Description BLOOD BLOOD LEFT FOREARM  Final   Special Requests   Final    BOTTLES DRAWN AEROBIC AND ANAEROBIC Blood Culture adequate volume   Culture   Final    NO GROWTH < 12 HOURS Performed at [facility], [address] [place], [place] [address]    Report Status PENDING  Incomplete  Blood culture (routine x 2)     Status: None (  Preliminary result)   Collection Time: 05/20/24  6:36 PM   Specimen: BLOOD  Result Value Ref Range Status   Specimen Description BLOOD BLOOD LEFT ARM  Final   Special Requests BLOOD Blood Culture adequate volume  Final   Culture   Final    NO GROWTH < 12 HOURS Performed at [facility], [address], [place], [place] [address]    Report Status PENDING  Incomplete         Radiology Studies: DG Abd Portable 1V Result Date: 05/21/2024 CLINICAL DATA:  NG tube placement EXAM: PORTABLE ABDOMEN - 1 VIEW COMPARISON:  CT abdomen and pelvis 05/20/2024 FINDINGS: Limited field of view for tube placement verification purposes. An enteric tube is present with tip projecting over the left upper quadrant consistent with location in the body of the stomach. Lung bases are clear. Limited visualization of bowel but gaseous distention is suggested. Degenerative changes in the spine. IMPRESSION: Enteric tube tip projects over the  left upper quadrant consistent with location in the body of stomach. Electronically Signed   By: Elsie Gravely M.D.   On: 05/21/2024 00:19   CT ABDOMEN PELVIS W CONTRAST Result Date: 05/20/2024 CLINICAL DATA:  Sharp abdominal pain nausea vomiting history of colorectal cancer coffee-ground emesis, history of rectal cancer EXAM: CT ABDOMEN AND PELVIS WITH CONTRAST TECHNIQUE: Multidetector CT imaging of the abdomen and pelvis was performed using the standard protocol following bolus administration of intravenous contrast. RADIATION DOSE REDUCTION: This exam was performed according to the departmental dose-optimization program which includes automated exposure control, adjustment of the mA and/or kV according to patient size and/or use of iterative reconstruction technique. CONTRAST:  80mL OMNIPAQUE  IOHEXOL  300 MG/ML  SOLN COMPARISON:  CT 11/13/2022, 11/08/2022, 02/19/2021, chest CT 11/10/2022 FINDINGS: Lower chest: Lung bases demonstrate mild subpleural reticulation and dependent atelectasis. 7 mm subpleural focus of nodularity or nodule left lung base, series 4, image 4. Coronary vascular calcification Hepatobiliary: Possible subtle surface nodularity of liver. Subcentimeter hypodensities too small to further characterize. Interim development of indeterminate hypodense lesion within the right hepatic lobe, 14 mm on series 2, image 12. Gallstones. Small volume of air within the gallbladder and common bile duct, correlate for interval history of sphincterotomy Pancreas: Unremarkable. No pancreatic ductal dilatation or surrounding inflammatory changes. Spleen: Normal in size without focal abnormality. Adrenals/Urinary Tract: Stable adrenal glands. No hydronephrosis. Renal cysts for which no imaging follow-up is recommended. Slightly thick-walled urinary bladder with mild mucosal enhancement. 10 mm stone within the posterior bladder. Stomach/Bowel: Moderate fluid distension of the stomach. Multiple dilated  fluid-filled loops of small bowel measuring up to 3.8 cm consistent with a bowel obstruction. Apparent 2 transition points within close vicinity of each other within the anterior pelvis, series 2, image 53 and series 2, image 60. Some mesenteric congestion in the pelvis. No acute bowel wall thickening. Negative appendix. Status post low anterior resection with left abdominal colostomy. Vascular/Lymphatic: Advanced aortic atherosclerosis. No aneurysm. No suspicious lymph nodes. Reproductive: Negative prostate Other: Negative for free air. Similar presacral soft tissue thickening. No significant ascites. Musculoskeletal: Chronic compression fractures at L4 and L5 and superior endplate of L1 IMPRESSION: 1. Findings consistent with small-bowel obstruction with 2 transition points within close vicinity of each other within the anterior pelvis, possibly due to adhesions but given appearance, closed loop physiology cannot be excluded. There is some mesenteric vascular congestion within the pelvis. 2. Status post low anterior resection with left abdominal colostomy. Similar presacral soft tissue thickening. 3. Interim development  of 14 mm indeterminate hypodense lesion within the right hepatic lobe. Given history of rectal cancer, recommend further evaluation with nonemergent MRI of the abdomen with and without contrast. 4. Gallstones. Small volume of air within the gallbladder and common bile duct, correlate for interval history of sphincterotomy. 5. 10 mm stone within the posterior bladder. Slightly thick-walled urinary bladder with mild mucosal enhancement, correlate with urinalysis to exclude cystitis. 6. 7 mm subpleural focus of nodularity or nodule at the left lung base. Short-term CT chest follow-up recommended to exclude developing pulmonary nodule. 7. Aortic atherosclerosis. Aortic Atherosclerosis (ICD10-I70.0). Electronically Signed   By: Luke Bun M.D.   On: 05/20/2024 20:16   DG Chest Portable 1  View Result Date: 05/20/2024 CLINICAL DATA:  Shortness of breath with questionable hypoxia and cough, evaluate for pneumonia in the setting of possible sepsis EXAM: PORTABLE CHEST 1 VIEW COMPARISON:  Chest x-ray 11/13/2022 FINDINGS: The heart and mediastinal contours are unchanged. Atherosclerotic plaque. No focal consolidation. No pulmonary edema. No pleural effusion. No pneumothorax. No acute osseous abnormality. IMPRESSION: 1. No active disease. 2.  Aortic Atherosclerosis (ICD10-I70.0). Electronically Signed   By: Morgane  Naveau M.D.   On: 05/20/2024 17:47        Scheduled Meds:  folic acid   1 mg Oral Daily   pantoprazole  (PROTONIX ) IV  40 mg Intravenous Q12H   thiamine  (VITAMIN B1) injection  100 mg Intravenous Daily   Continuous Infusions:  sodium chloride  75 mL/hr at 05/21/24 0553     LOS: 1 day     Devaughn KATHEE Ban, MD [facility]   If 7PM-7AM, please contact night-coverage [email or web address] Password [email or web address] 05/21/2024, 10:26 AM

## 2024-05-22 ENCOUNTER — Inpatient Hospital Stay

## 2024-05-22 DIAGNOSIS — K56609 Unspecified intestinal obstruction, unspecified as to partial versus complete obstruction: Secondary | ICD-10-CM | POA: Diagnosis not present

## 2024-05-22 LAB — GLUCOSE, CAPILLARY: Glucose-Capillary: 73 mg/dL (ref 70–99)

## 2024-05-22 LAB — BASIC METABOLIC PANEL WITH GFR
Anion gap: 11 (ref 5–15)
BUN: 24 mg/dL — ABNORMAL HIGH (ref 8–23)
CO2: 25 mmol/L (ref 22–32)
Calcium: 8.5 mg/dL — ABNORMAL LOW (ref 8.9–10.3)
Chloride: 108 mmol/L (ref 98–111)
Creatinine, Ser: 1.12 mg/dL (ref 0.61–1.24)
GFR, Estimated: >60 mL/min (ref >60)
Glucose, Bld: 80 mg/dL (ref 70–99)
Potassium: 3.7 mmol/L (ref 3.5–5.1)
Sodium: 144 mmol/L (ref 135–145)

## 2024-05-22 LAB — CBC
HCT: 43.1 % (ref 39.0–52.0)
Hemoglobin: 14.4 g/dL (ref 13.0–17.0)
MCH: 35.4 pg — ABNORMAL HIGH (ref 26.0–34.0)
MCHC: 33.4 g/dL (ref 30.0–36.0)
MCV: 105.9 fL — ABNORMAL HIGH (ref 80.0–100.0)
Platelets: 150 K/uL (ref 150–400)
RBC: 4.07 MIL/uL — ABNORMAL LOW (ref 4.22–5.81)
RDW: 14.4 % (ref 11.5–15.5)
WBC: 8.1 K/uL (ref 4.0–10.5)
nRBC: 0 % (ref 0.0–0.2)

## 2024-05-22 LAB — URINE CULTURE

## 2024-05-22 LAB — VITAMIN B12: Vitamin B-12: 1734 pg/mL — ABNORMAL HIGH (ref 180–914)

## 2024-05-22 MED ORDER — IOHEXOL 300 MG/ML  SOLN
75.0000 mL | Freq: Once | INTRAMUSCULAR | Status: AC | PRN
Start: 2024-05-22 — End: 2024-05-22
  Administered 2024-05-22: 75 mL via INTRAVENOUS

## 2024-05-22 MED ORDER — POLYETHYLENE GLYCOL 3350 17 G PO PACK
17.0000 g | PACK | Freq: Every day | ORAL | Status: DC
  Administered 2024-05-22 – 2024-05-31 (×6): 17 g via ORAL
  Filled 2024-05-22 (×6): qty 1

## 2024-05-22 MED ORDER — GADOBUTROL 1 MMOL/ML IV SOLN
8.0000 mL | Freq: Once | INTRAVENOUS | Status: AC | PRN
  Administered 2024-05-22: 8 mL via INTRAVENOUS

## 2024-05-22 NOTE — Plan of Care (Signed)

## 2024-05-22 NOTE — Progress Notes (Addendum)
 PROGRESS NOTE    Stephen [name]  FMW:[id number] DOB: 12/05/1945 DOA: 05/20/2024 PCP: Stephen Leita POUR, MD  Outpatient Specialists: oncology    Brief Narrative:   From admission h and p  Stephen [name] is a 78 y.o. 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  His abdominal pain is mainly in the upper abdomen, constant, mild to moderate, aching, nonradiating, not aggravated or alleviated by any known factors. He has nausea and multiple episodes of vomiting with coffee-ground emesis.  He still has small amount of output from colostomy.  No fever or chills.  Denies chest pain or SOB.  Patient has mild dry cough.  He uses condom catheter and reports burning on urination, no dysuria or hematuria.  Assessment & Plan:   Principal Problem:   SBO (small bowel obstruction) (HCC) Active Problems:   UTI (urinary tract infection)   AKI (acute kidney injury) (HCC)   HTN (hypertension)   Chronic diastolic CHF (congestive heart failure) (HCC)   Rectal cancer (HCC)   Liver lesion   Coffee ground emesis   Alcohol use   Overweight (BMI 25.0-29.9)   Malnutrition of moderate degree  # SBO Presumably mechanical from prior low anterior resection. CT scan showed no serious complications. S/p gastrograffin. SBO now appears to be resolving. S/p NG tube - clears, adat - possible d/c tomorrow  # History rectal cancer # Colostomy status With isolated met to left lung, s/p resection, adjuvant chemo. Last oncology visit in 2022, lost to f/u, wife says can't transport him to doctor appointments.  Here CT of abdomen/pelvis shows left lung base nodule as well as liver nodule.  - mri abdomen and ct chest  # AKI # Lactic acidosis Likely prerenal 2/2 above process. Resolved   # Acute cystitis Ua suggestive of possible infection and does endorse recent dysuria. Culture growing multiple species - continue ceftriaxone  until reliably taking po - will plan on d/c with keflex  # Bedbound Wife says this way last 5 years  # HTN Bp normal, doesn't appear to be on meds at home  # Alcohol use Few drinks a day, denies withdrawal history, no s/s of such here - monitor  # Macrocytosis - f/u b12, folate   DVT prophylaxis: scds Code Status: dnr/dni Family Communication: wife Stephen [name] updated telephonically 7/9  Level of care: Telemetry Medical Status is: Inpatient Remains inpatient appropriate because: severity of illness    Consultants:  Gen surg  Procedures: NG tube placement  Antimicrobials:  none    Subjective: Reports feeling much better, no abd pain, tolerating clears  Objective: Vitals:   05/21/24 2021 05/22/24 0431 05/22/24 0737 05/22/24 0738  BP: 133/79 132/73 (!) 133/51 (!) 129/54  Pulse: 87 67 61 67  Resp: 18 18 16    Temp: 99.5 F (37.5 C) (!) 97.4 F (36.3 C) 98.3 F (36.8 C)   TempSrc:  Oral    SpO2: 96% 94% 96% 94%  Weight:  85.4 kg    Height:        Intake/Output Summary (Last 24 hours) at 05/22/2024 1233 Last data filed at 05/22/2024 0948 Gross per 24 hour  Intake 291.62 ml  Output  1550 ml  Net -1258.38 ml   Filed Weights   05/20/24 1614 05/21/24 0500 05/22/24 0431  Weight: 84.8 kg 83.3 kg 85.4 kg    Examination:  General exam: Appears calm and comfortable  Respiratory system: Clear to auscultation. Respiratory effort normal. Cardiovascular system: S1 & S2 heard, RRR.  Gastrointestinal system: Abdomen is distended, soft and nontender distention improved. Ostomy bag with brown stool. Central nervous system: Alert and oriented. No focal  neurological deficits. Extremities: warm. Decreased power and muscle mass in LEs Skin: No rashes, lesions or ulcers Psychiatry: Judgement and insight appear normal. Mood & affect appropriate.     Data Reviewed: I have personally reviewed following labs and imaging studies  CBC: Recent Labs  Lab 05/20/24 1715 05/20/24 2258 05/21/24 0459 05/22/24 0454  WBC 13.9* 13.5* 13.7* 8.1  HGB 17.8* 17.2* 15.5 14.4  HCT 52.0 49.6 45.7 43.1  MCV 102.2* 101.8* 104.6* 105.9*  PLT 246 227 194 150   Basic Metabolic Panel: Recent Labs  Lab 05/20/24 1715 05/21/24 0459 05/22/24 0454  NA 141 143 144  K 3.6 4.1 3.7  CL 99 102 108  CO2 25 29 25   GLUCOSE 154* 131* 80  BUN 27* 27* 24*  CREATININE 1.91* 1.44* 1.12  CALCIUM 9.9 9.2 8.5*   GFR: Estimated Creatinine Clearance: 57.7 mL/min (by C-G formula based on SCr of 1.12 mg/dL). Liver Function Tests: Recent Labs  Lab 05/20/24 1715  AST 34  ALT 16  ALKPHOS 99  BILITOT 2.4*  PROT 8.2*  ALBUMIN 3.5   Recent Labs  Lab 05/20/24 1715  LIPASE 26   No results for input(s): AMMONIA in the last 168 hours. Coagulation Profile: Recent Labs  Lab 05/20/24 2258  INR 1.2   Cardiac Enzymes: No results for input(s): CKTOTAL, CKMB, CKMBINDEX, TROPONINI in the last 168 hours. BNP (last 3 results) No results for input(s): PROBNP in the last 8760 hours. HbA1C: No results for input(s): HGBA1C in the last 72 hours. CBG: Recent Labs  Lab 05/21/24 0739 05/22/24 0739  GLUCAP 111* 73   Lipid Profile: No results for input(s): CHOL, HDL, LDLCALC, TRIG, CHOLHDL, LDLDIRECT in the last 72 hours. Thyroid Function Tests: No results for input(s): TSH, T4TOTAL, FREET4, T3FREE, THYROIDAB in the last 72 hours. Anemia Panel: No results for input(s): VITAMINB12, FOLATE, FERRITIN, TIBC, IRON, RETICCTPCT in the last 72 hours. Urine analysis:    Component Value Date/Time   COLORURINE YELLOW (A) 05/20/2024  1720   APPEARANCEUR TURBID (A) 05/20/2024 1720   APPEARANCEUR Cloudy 04/28/2014 0900   LABSPEC 1.027 05/20/2024 1720   LABSPEC 1.023 04/28/2014 0900   PHURINE 7.0 05/20/2024 1720   GLUCOSEU NEGATIVE 05/20/2024 1720   GLUCOSEU Negative 04/28/2014 0900   HGBUR NEGATIVE 05/20/2024 1720   BILIRUBINUR SMALL (A) 05/20/2024 1720   BILIRUBINUR Negative 04/28/2014 0900   KETONESUR 5 (A) 05/20/2024 1720   PROTEINUR >=300 (A) 05/20/2024 1720   NITRITE NEGATIVE 05/20/2024 1720   LEUKOCYTESUR MODERATE (A) 05/20/2024 1720   LEUKOCYTESUR Negative 04/28/2014 0900   Sepsis Labs: @LABRCNTIP (procalcitonin:4,lacticidven:4)  ) Recent Results (from the past 240 hours)  Urine Culture (for pregnant, neutropenic or urologic patients or patients with an indwelling urinary catheter)     Status: Abnormal   Collection Time: 05/20/24  4:47 PM   Specimen: Urine, Clean Catch  Result Value Ref Range Status   Specimen Description   Final    URINE, CLEAN CATCH Performed at [facility], [address] [place] [address] [place], [place] [address]  Special Requests   Final    NONE Performed at [facility], [address] [place], [place] [address]    Culture MULTIPLE SPECIES PRESENT, SUGGEST RECOLLECTION (A)  Final   Report Status 05/22/2024 FINAL  Final  Blood culture (routine x 2)     Status: None (Preliminary result)   Collection Time: 05/20/24  6:35 PM   Specimen: BLOOD  Result Value Ref Range Status   Specimen Description BLOOD BLOOD LEFT FOREARM  Final   Special Requests   Final    BOTTLES DRAWN AEROBIC AND ANAEROBIC Blood Culture adequate volume   Culture   Final    NO GROWTH 2 DAYS Performed at [facility], [address] [place], [place] [address]    Report Status PENDING  Incomplete  Blood culture (routine x 2)     Status: None (Preliminary result)   Collection Time: 05/20/24  6:36 PM   Specimen: BLOOD  Result Value Ref Range Status   Specimen Description BLOOD BLOOD LEFT  ARM  Final   Special Requests BLOOD Blood Culture adequate volume  Final   Culture   Final    NO GROWTH 2 DAYS Performed at [facility], [address], [place], [place] [address]    Report Status PENDING  Incomplete         Radiology Studies: DG Abd Portable 1V-Small Bowel Obstruction Protocol-initial, 8 hr delay Result Date: 05/21/2024 CLINICAL DATA:  SBO- 8 hr picture EXAM: PORTABLE ABDOMEN - 1 VIEW COMPARISON:  CT abdomen pelvis 05/20/2024, x-ray abdomen 05/21/2024 FINDINGS: Enteric tube with tip and side port overlying the expected region the gastric lumen. PO contrast reaches the large bowel. PO contrast again noted within dilated small bowel. Gaseous dilatation of several loops of small bowel. No radio-opaque calculi or other significant radiographic abnormality are seen. IMPRESSION: Findings suggestive of partial small bowel obstruction. Electronically Signed   By: Morgane  Naveau M.D.   On: 05/21/2024 20:01   DG Abd Portable 1V Result Date: 05/21/2024 CLINICAL DATA:  NG tube placement EXAM: PORTABLE ABDOMEN - 1 VIEW COMPARISON:  CT abdomen and pelvis 05/20/2024 FINDINGS: Limited field of view for tube placement verification purposes. An enteric tube is present with tip projecting over the left upper quadrant consistent with location in the body of the stomach. Lung bases are clear. Limited visualization of bowel but gaseous distention is suggested. Degenerative changes in the spine. IMPRESSION: Enteric tube tip projects over the left upper quadrant consistent with location in the body of stomach. Electronically Signed   By: Elsie Gravely M.D.   On: 05/21/2024 00:19   CT ABDOMEN PELVIS W CONTRAST Result Date: 05/20/2024 CLINICAL DATA:  Sharp abdominal pain nausea vomiting history of colorectal cancer coffee-ground emesis, history of rectal cancer EXAM: CT ABDOMEN AND PELVIS WITH CONTRAST TECHNIQUE: Multidetector CT imaging of the abdomen and pelvis was performed using the  standard protocol following bolus administration of intravenous contrast. RADIATION DOSE REDUCTION: This exam was performed according to the departmental dose-optimization program which includes automated exposure control, adjustment of the mA and/or kV according to patient size and/or use of iterative reconstruction technique. CONTRAST:  80mL OMNIPAQUE  IOHEXOL  300 MG/ML  SOLN COMPARISON:  CT 11/13/2022, 11/08/2022, 02/19/2021, chest CT 11/10/2022 FINDINGS: Lower chest: Lung bases demonstrate mild subpleural reticulation and dependent atelectasis. 7 mm subpleural focus of nodularity or nodule left lung base, series 4, image 4. Coronary vascular calcification Hepatobiliary: Possible subtle surface nodularity of liver. Subcentimeter hypodensities too small to further characterize. Interim  development of indeterminate hypodense lesion within the right hepatic lobe, 14 mm on series 2, image 12. Gallstones. Small volume of air within the gallbladder and common bile duct, correlate for interval history of sphincterotomy Pancreas: Unremarkable. No pancreatic ductal dilatation or surrounding inflammatory changes. Spleen: Normal in size without focal abnormality. Adrenals/Urinary Tract: Stable adrenal glands. No hydronephrosis. Renal cysts for which no imaging follow-up is recommended. Slightly thick-walled urinary bladder with mild mucosal enhancement. 10 mm stone within the posterior bladder. Stomach/Bowel: Moderate fluid distension of the stomach. Multiple dilated fluid-filled loops of small bowel measuring up to 3.8 cm consistent with a bowel obstruction. Apparent 2 transition points within close vicinity of each other within the anterior pelvis, series 2, image 53 and series 2, image 60. Some mesenteric congestion in the pelvis. No acute bowel wall thickening. Negative appendix. Status post low anterior resection with left abdominal colostomy. Vascular/Lymphatic: Advanced aortic atherosclerosis. No aneurysm. No  suspicious lymph nodes. Reproductive: Negative prostate Other: Negative for free air. Similar presacral soft tissue thickening. No significant ascites. Musculoskeletal: Chronic compression fractures at L4 and L5 and superior endplate of L1 IMPRESSION: 1. Findings consistent with small-bowel obstruction with 2 transition points within close vicinity of each other within the anterior pelvis, possibly due to adhesions but given appearance, closed loop physiology cannot be excluded. There is some mesenteric vascular congestion within the pelvis. 2. Status post low anterior resection with left abdominal colostomy. Similar presacral soft tissue thickening. 3. Interim development of 14 mm indeterminate hypodense lesion within the right hepatic lobe. Given history of rectal cancer, recommend further evaluation with nonemergent MRI of the abdomen with and without contrast. 4. Gallstones. Small volume of air within the gallbladder and common bile duct, correlate for interval history of sphincterotomy. 5. 10 mm stone within the posterior bladder. Slightly thick-walled urinary bladder with mild mucosal enhancement, correlate with urinalysis to exclude cystitis. 6. 7 mm subpleural focus of nodularity or nodule at the left lung base. Short-term CT chest follow-up recommended to exclude developing pulmonary nodule. 7. Aortic atherosclerosis. Aortic Atherosclerosis (ICD10-I70.0). Electronically Signed   By: Luke Bun M.D.   On: 05/20/2024 20:16   DG Chest Portable 1 View Result Date: 05/20/2024 CLINICAL DATA:  Shortness of breath with questionable hypoxia and cough, evaluate for pneumonia in the setting of possible sepsis EXAM: PORTABLE CHEST 1 VIEW COMPARISON:  Chest x-ray 11/13/2022 FINDINGS: The heart and mediastinal contours are unchanged. Atherosclerotic plaque. No focal consolidation. No pulmonary edema. No pleural effusion. No pneumothorax. No acute osseous abnormality. IMPRESSION: 1. No active disease. 2.  Aortic  Atherosclerosis (ICD10-I70.0). Electronically Signed   By: Morgane  Naveau M.D.   On: 05/20/2024 17:47        Scheduled Meds:  folic acid   1 mg Oral Daily   pantoprazole  (PROTONIX ) IV  40 mg Intravenous Q12H   polyethylene glycol  17 g Oral Daily   thiamine  (VITAMIN B1) injection  100 mg Intravenous Daily   Continuous Infusions:     LOS: 2 days     Devaughn KATHEE Ban, MD [facility]   If 7PM-7AM, please contact night-coverage [email or web address] Password [facility] 05/22/2024, 12:33 PM

## 2024-05-22 NOTE — TOC CM/SW Note (Signed)
 Transition of Care [facility]) - Inpatient Brief Assessment   Patient Details  Name: Stephen [name] MRN: [id number] Date of Birth: 1945/11/22  [facility]) CM/SW Contact:    Lauraine JAYSON Carpen, LCSW Phone Number: 05/22/2024, 1:15 PM   Clinical Narrative: CSW reviewed chart. Patient is currently on acute oxygen. Home oxygen had been ordered in January 2024 through [employer] but they said it was picked back up in April 2024. CSW will follow for this potential discharge need.   Transition of Care Asessment: Insurance and Status: Insurance coverage has been reviewed Patient has primary care physician: Yes Home environment has been reviewed: Single family home Prior level of function:: Not documented Prior/Current Home Services: No current home services Social Drivers of Health Review: SDOH reviewed no interventions necessary Readmission risk has been reviewed: Yes Transition of care needs: transition of care needs identified, TOC will continue to follow

## 2024-05-22 NOTE — Care Management Important Message (Signed)
 Important Message  Patient Details  Name: Barney Russomanno MRN: [id number] Date of Birth: [birth year]   Important Message Given:  Yes - Medicare IM     Rojelio SHAUNNA Rattler 05/22/2024, 11:56 AM

## 2024-05-22 NOTE — Progress Notes (Signed)
 [facility]- General Surgery  SURGICAL PROGRESS NOTE  Hospital Day(s): 2.   Interval History:  Patient seen and examined. No acute events or new complaints overnight. States he is feeling better. Has had several bowel movements in ostomy bag. Denies any nausea or vomiting. Less output from NGT.    Vital signs in last 24 hours: [min-max] current  Temp:  [97.2 F (36.2 C)-99.5 F (37.5 C)] 98.3 F (36.8 C) (07/09 0737) Pulse Rate:  [61-87] 67 (07/09 0738) Resp:  [16-18] 16 (07/09 0737) BP: (117-133)/(51-79) 129/54 (07/09 0738) SpO2:  [94 %-96 %] 94 % (07/09 0738) Weight:  [85.4 kg] 85.4 kg (07/09 0431)     Height: 5' 7 (170.2 cm) Weight: 85.4 kg BMI (Calculated): 29.48   Intake/Output last 2 shifts:  07/08 0701 - 07/09 0700 In: 1404.2 [I.V.:1404.2] Out: 1900 [Urine:450; Emesis/NG output:1450]   Physical Exam:  Constitutional: alert, cooperative and no distress  Respiratory: breathing non-labored at rest  Cardiovascular: regular rate and sinus rhythm  Gastrointestinal: soft, mildly tender, and less distended  Labs:     Latest Ref Rng & Units 05/22/2024    4:54 AM 05/21/2024    4:59 AM 05/20/2024   10:58 PM  CBC  WBC 4.0 - 10.5 K/uL 8.1  13.7  13.5   Hemoglobin 13.0 - 17.0 g/dL 85.5  84.4  82.7   Hematocrit 39.0 - 52.0 % 43.1  45.7  49.6   Platelets 150 - 400 K/uL 150  194  227       Latest Ref Rng & Units 05/22/2024    4:54 AM 05/21/2024    4:59 AM 05/20/2024    5:15 PM  CMP  Glucose 70 - 99 mg/dL 80  868  845   BUN 8 - 23 mg/dL 24  27  27    Creatinine 0.61 - 1.24 mg/dL 8.87  8.55  8.08   Sodium 135 - 145 mmol/L 144  143  141   Potassium 3.5 - 5.1 mmol/L 3.7  4.1  3.6   Chloride 98 - 111 mmol/L 108  102  99   CO2 22 - 32 mmol/L 25  29  25    Calcium 8.9 - 10.3 mg/dL 8.5  9.2  9.9   Total Protein 6.5 - 8.1 g/dL   8.2   Total Bilirubin 0.0 - 1.2 mg/dL   2.4   Alkaline Phos 38 - 126 U/L   99   AST 15 - 41 U/L   34   ALT 0 - 44 U/L   16     Imaging studies:    EXAM: PORTABLE ABDOMEN - 1 VIEW   COMPARISON:  CT abdomen pelvis 05/20/2024, x-ray abdomen 05/21/2024   FINDINGS: Enteric tube with tip and side port overlying the expected region the gastric lumen. PO contrast reaches the large bowel. PO contrast again noted within dilated small bowel. Gaseous dilatation of several loops of small bowel. No radio-opaque calculi or other significant radiographic abnormality are seen.   IMPRESSION: Findings suggestive of partial small bowel obstruction.     Electronically Signed   By: Morgane  Naveau M.D.   On: 05/21/2024 20:01  Assessment/Plan:  78 y.o. male with partial small bowel obstruction, complicated by pertinent comorbidities including HFpEF, alcohol use, metastatic rectal cancer with Foley and colostomy, HTN, AKI, and UTI .   - No fever, not tachycardiac, resolved leukocytosis   - Clinically improving with patient having bowel movements, being less distended,and no N/V reported. CT of abdomen and pelvis showing  contrast in colon is reassuring for possible resolving of SBO. Remove NGT and start clear liquid diet. Patient agrees with plan.   -Continue pain management and DVT prophylaxis     -- Seleny Allbright Barrientos PA-C

## 2024-05-22 NOTE — Plan of Care (Signed)
  Problem: Education: Goal: Knowledge of General Education information will improve Description: Including pain rating scale, medication(s)/side effects and non-pharmacologic comfort measures Outcome: Progressing   Problem: Health Behavior/Discharge Planning: Goal: Ability to manage health-related needs will improve Outcome: Progressing   Problem: Clinical Measurements: Goal: Ability to maintain clinical measurements within normal limits will improve Outcome: Progressing   Problem: Coping: Goal: Level of anxiety will decrease Outcome: Progressing   Problem: Activity: Goal: Risk for activity intolerance will decrease Outcome: Not Progressing   Problem: Nutrition: Goal: Adequate nutrition will be maintained Outcome: Not Progressing

## 2024-05-23 DIAGNOSIS — K56609 Unspecified intestinal obstruction, unspecified as to partial versus complete obstruction: Secondary | ICD-10-CM | POA: Diagnosis not present

## 2024-05-23 LAB — BASIC METABOLIC PANEL WITH GFR
Anion gap: 12 (ref 5–15)
BUN: 18 mg/dL (ref 8–23)
CO2: 26 mmol/L (ref 22–32)
Calcium: 8.7 mg/dL — ABNORMAL LOW (ref 8.9–10.3)
Chloride: 102 mmol/L (ref 98–111)
Creatinine, Ser: 1.2 mg/dL (ref 0.61–1.24)
GFR, Estimated: >60 mL/min (ref >60)
Glucose, Bld: 99 mg/dL (ref 70–99)
Potassium: 3.4 mmol/L — ABNORMAL LOW (ref 3.5–5.1)
Sodium: 140 mmol/L (ref 135–145)

## 2024-05-23 LAB — FOLATE: Folate: 12.1 ng/mL (ref >5.9)

## 2024-05-23 MED ORDER — SODIUM CHLORIDE 0.9 % IV SOLN
1.0000 g | INTRAVENOUS | Status: AC
  Administered 2024-05-23 – 2024-05-29 (×7): 1 g via INTRAVENOUS
  Filled 2024-05-23 (×8): qty 10

## 2024-05-23 NOTE — Plan of Care (Signed)

## 2024-05-23 NOTE — Progress Notes (Signed)
 PROGRESS NOTE    Stephen [name]  FMW:[id number] DOB: [birth year] DOA: 05/20/2024 PCP: Derick Leita POUR, MD  Outpatient Specialists: oncology    Brief Narrative:   From admission h and p  Stephen [name] is a 78 y.o. 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  His abdominal pain is mainly in the upper abdomen, constant, mild to moderate, aching, nonradiating, not aggravated or alleviated by any known factors. He has nausea and multiple episodes of vomiting with coffee-ground emesis.  He still has small amount of output from colostomy.  No fever or chills.  Denies chest pain or SOB.  Patient has mild dry cough.  He uses condom catheter and reports burning on urination, no dysuria or hematuria.  Assessment & Plan:   Principal Problem:   SBO (small bowel obstruction) (HCC) Active Problems:   UTI (urinary tract infection)   AKI (acute kidney injury) (HCC)   HTN (hypertension)   Chronic diastolic CHF (congestive heart failure) (HCC)   Rectal cancer (HCC)   Liver lesion   Coffee ground emesis   Alcohol use   Overweight (BMI 25.0-29.9)   Malnutrition of moderate degree  # SBO Presumably mechanical from prior low anterior resection. CT scan showed no serious complications. S/p gastrograffin. SBO now appears to be resolving. S/p NG tube. Tolerating diet. But ostomy output has dropped off again today and MRI shows persistent obstruction - diet per gen surg - gen surg advises keeping at least one more night  # History rectal cancer # Colostomy status # New liver and lung  masses With isolated met to left lung, s/p resection, adjuvant chemo. Last oncology visit in 2022, lost to f/u, wife says can't transport him to doctor appointments. Here CT of abdomen/pelvis shows left lung base nodule as well as liver nodule. MRI of liver and CT of chest consistent with probable malignancy (metastases vs new primary) - will touch base w/ Dr. Jacobo regarding next steps. Liver mass biopsy?  # AKI # Lactic acidosis Likely prerenal 2/2 above process. Resolved  # Acute cystitis Ua suggestive of possible infection and does endorse recent dysuria. Culture growing multiple species - continue ceftriaxone  until reliably taking po - will plan on d/c with keflex  # Bedbound Wife says this way last 5 years  # HTN Bp normal, doesn't appear to be on meds at home  # Alcohol use Few drinks a day, denies withdrawal history, no s/s of such here - monitor  # Macrocytosis B12 and folate wnl   DVT prophylaxis: scds Code Status: dnr/dni Family Communication: wife dora updated at bedside 7/10  Level of care: Telemetry Medical Status is: Inpatient Remains inpatient appropriate because: severity of illness    Consultants:  Gen surg  Procedures: NG tube placement  Antimicrobials:  none    Subjective: Reports feeling much better, no abd pain, tolerating food, ostomy outpt has dropped of this pm  Objective: Vitals:   05/22/24 2046 05/23/24 0406 05/23/24 0757 05/23/24 1458  BP: 127/64 138/79 121/68 114/88  Pulse: 81 73 75 85  Resp: 18 18 16 16   Temp: 98.2 F (36.8 C) 98.7 F (37.1 C) 98.1 F (36.7  C) 98.3 F (36.8 C)  TempSrc: Oral Oral Oral   SpO2: 93% 92% 94% 92%  Weight:  85.9 kg    Height:        Intake/Output Summary (Last 24 hours) at 05/23/2024 1630 Last data filed at 05/23/2024 1528 Gross per 24 hour  Intake 340 ml  Output --  Net 340 ml   Filed Weights   05/21/24 0500 05/22/24 0431 05/23/24 0406  Weight: 83.3 kg 85.4 kg 85.9 kg     Examination:  General exam: Appears calm and comfortable  Respiratory system: Clear to auscultation. Respiratory effort normal. Cardiovascular system: S1 & S2 heard, RRR.  Gastrointestinal system: Abdomen is distended, soft and nontender. Ostomy bag with small amount brown stool. Central nervous system: Alert and oriented. No focal neurological deficits. Extremities: warm. Decreased power and muscle mass in LEs Skin: No rashes, lesions or ulcers Psychiatry: Judgement and insight appear normal. Mood & affect appropriate.     Data Reviewed: I have personally reviewed following labs and imaging studies  CBC: Recent Labs  Lab 05/20/24 1715 05/20/24 2258 05/21/24 0459 05/22/24 0454  WBC 13.9* 13.5* 13.7* 8.1  HGB 17.8* 17.2* 15.5 14.4  HCT 52.0 49.6 45.7 43.1  MCV 102.2* 101.8* 104.6* 105.9*  PLT 246 227 194 150   Basic Metabolic Panel: Recent Labs  Lab 05/20/24 1715 05/21/24 0459 05/22/24 0454 05/23/24 0201  NA 141 143 144 140  K 3.6 4.1 3.7 3.4*  CL 99 102 108 102  CO2 25 29 25 26   GLUCOSE 154* 131* 80 99  BUN 27* 27* 24* 18  CREATININE 1.91* 1.44* 1.12 1.20  CALCIUM 9.9 9.2 8.5* 8.7*   GFR: Estimated Creatinine Clearance: 54 mL/min (by C-G formula based on SCr of 1.2 mg/dL). Liver Function Tests: Recent Labs  Lab 05/20/24 1715  AST 34  ALT 16  ALKPHOS 99  BILITOT 2.4*  PROT 8.2*  ALBUMIN 3.5   Recent Labs  Lab 05/20/24 1715  LIPASE 26   No results for input(s): AMMONIA in the last 168 hours. Coagulation Profile: Recent Labs  Lab 05/20/24 2258  INR 1.2   Cardiac Enzymes: No results for input(s): CKTOTAL, CKMB, CKMBINDEX, TROPONINI in the last 168 hours. BNP (last 3 results) No results for input(s): PROBNP in the last 8760 hours. HbA1C: No results for input(s): HGBA1C in the last 72 hours. CBG: Recent Labs  Lab 05/21/24 0739 05/22/24 0739  GLUCAP 111* 73   Lipid Profile: No results for input(s): CHOL, HDL,  LDLCALC, TRIG, CHOLHDL, LDLDIRECT in the last 72 hours. Thyroid Function Tests: No results for input(s): TSH, T4TOTAL, FREET4, T3FREE, THYROIDAB in the last 72 hours. Anemia Panel: Recent Labs    05/22/24 1253 05/23/24 0201  VITAMINB12 1,734*  --   FOLATE  --  12.1   Urine analysis:    Component Value Date/Time   COLORURINE YELLOW (A) 05/20/2024 1720   APPEARANCEUR TURBID (A) 05/20/2024 1720   APPEARANCEUR Cloudy 04/28/2014 0900   LABSPEC 1.027 05/20/2024 1720   LABSPEC 1.023 04/28/2014 0900   PHURINE 7.0 05/20/2024 1720   GLUCOSEU NEGATIVE 05/20/2024 1720   GLUCOSEU Negative 04/28/2014 0900   HGBUR NEGATIVE 05/20/2024 1720   BILIRUBINUR SMALL (A) 05/20/2024 1720   BILIRUBINUR Negative 04/28/2014 0900   KETONESUR 5 (A) 05/20/2024 1720   PROTEINUR >=300 (A) 05/20/2024 1720   NITRITE NEGATIVE 05/20/2024 1720   LEUKOCYTESUR MODERATE (A) 05/20/2024 1720   LEUKOCYTESUR Negative 04/28/2014 0900   Sepsis Labs: @LABRCNTIP (procalcitonin:4,lacticidven:4)  ) Recent Results (from  the past 240 hours)  Urine Culture (for pregnant, neutropenic or urologic patients or patients with an indwelling urinary catheter)     Status: Abnormal   Collection Time: 05/20/24  4:47 PM   Specimen: Urine, Clean Catch  Result Value Ref Range Status   Specimen Description   Final    URINE, CLEAN CATCH Performed at [facility], [address] [place], [place] [address]    Special Requests   Final    NONE Performed at [facility], [address] [place], [place] [address]    Culture MULTIPLE SPECIES PRESENT, SUGGEST RECOLLECTION (A)  Final   Report Status 05/22/2024 FINAL  Final  Blood culture (routine x 2)     Status: None (Preliminary result)   Collection Time: 05/20/24  6:35 PM   Specimen: BLOOD  Result Value Ref Range Status   Specimen Description BLOOD BLOOD LEFT FOREARM  Final   Special Requests   Final    BOTTLES DRAWN AEROBIC AND ANAEROBIC Blood  Culture adequate volume   Culture   Final    NO GROWTH 2 DAYS Performed at [facility], [address], [place], [place] [address]    Report Status PENDING  Incomplete  Blood culture (routine x 2)     Status: None (Preliminary result)   Collection Time: 05/20/24  6:36 PM   Specimen: BLOOD  Result Value Ref Range Status   Specimen Description BLOOD BLOOD LEFT ARM  Final   Special Requests BLOOD Blood Culture adequate volume  Final   Culture   Final    NO GROWTH 2 DAYS Performed at [facility], [address], [place], [place] [address]    Report Status PENDING  Incomplete         Radiology Studies: MR ABDOMEN W WO CONTRAST Result Date: 05/23/2024 CLINICAL DATA:  History of rectal cancer with lung metastases. New right hepatic lobe lesion. EXAM: MRI ABDOMEN WITHOUT AND WITH CONTRAST TECHNIQUE: Multiplanar multisequence MR imaging of the abdomen was performed both before and after the administration of intravenous contrast. CONTRAST:  8mL GADAVIST  GADOBUTROL  1 MMOL/ML IV SOLN COMPARISON:  CT scan 05/20/2024 FINDINGS: Lower chest: Minimal atelectasis at the right base. Hepatobiliary: Ill-defined T1 hypointense, T2 hyperintense lesion in the right hepatic lobe shows peripheral rim enhancement measuring approximately 1.9 x 1.9 cm. 4 mm T2 hyperintensity is identified in segment IV and a 2-3 mm T2 hyperintensity is identified in segment II. Neither lesion shows restricted diffusion or evidence of enhancement after IV contrast administration. Both were present on CT scan of 11/13/2022 and are compatible with tiny hepatic cysts. Multiple gallstones identified measuring up to at least 16 mm in size. Gas is identified in the lumen of the gallbladder, similar to prior CT in suggesting prior sphincterotomy. No intrahepatic or extrahepatic biliary dilation. Pancreas: No focal mass lesion. No dilatation of the main duct. No intraparenchymal cyst. No peripancreatic edema. Spleen:  No  splenomegaly. No suspicious focal mass lesion. Adrenals/Urinary Tract: No adrenal nodule or mass. Simple cyst noted right kidney. Left kidney unremarkable. Stomach/Bowel: Stomach is unremarkable. No gastric wall thickening. No evidence of outlet obstruction. Duodenum is normally positioned as is the ligament of Treitz. Mild gaseous small bowel dilatation noted within the visualized upper abdomen Vascular/Lymphatic: Atherosclerotic disease noted in the abdominal aorta without aneurysm. There is no gastrohepatic or hepatoduodenal ligament lymphadenopathy. No retroperitoneal or mesenteric lymphadenopathy. Other:  No intraperitoneal free fluid. Musculoskeletal: No focal suspicious marrow enhancement within the visualized bony anatomy. IMPRESSION:  1. 1.9 x 1.9 cm ill-defined T1 hypointense, T2 hyperintense lesion in the right hepatic lobe shows peripheral rim enhancement. Given findings in the chest, imaging features are felt to be most compatible with metastatic disease. Hepatic abscess is not entirely excluded. 2. Cholelithiasis. Gas in the lumen of the gallbladder, similar to prior CT in suggesting prior sphincterotomy. In the absence of prior sphincterotomy, infection would be a concern. 3. Mild gaseous small bowel dilatation within the visualized upper abdomen consistent with small-bowel obstruction documented on prior imaging. 4.  Aortic Atherosclerois (ICD10-170.0) Electronically Signed   By: Camellia Candle M.D.   On: 05/23/2024 05:21   CT CHEST W CONTRAST Result Date: 05/22/2024 CLINICAL DATA:  Pulmonary nodule history of rectal cancer * Tracking Code: BO * EXAM: CT CHEST WITH CONTRAST TECHNIQUE: Multidetector CT imaging of the chest was performed during intravenous contrast administration. RADIATION DOSE REDUCTION: This exam was performed according to the departmental dose-optimization program which includes automated exposure control, adjustment of the mA and/or kV according to patient size and/or use of  iterative reconstruction technique. CONTRAST:  75mL OMNIPAQUE  IOHEXOL  300 MG/ML  SOLN COMPARISON:  CT abdomen pelvis, 05/20/2024, CT chest, 11/10/2022 FINDINGS: Cardiovascular: Aortic atherosclerosis. Normal heart size. Three-vessel coronary artery calcifications. No pericardial effusion. Mediastinum/Nodes: No enlarged mediastinal, hilar, or axillary lymph nodes. Thyroid gland, trachea, and esophagus demonstrate no significant findings. Lungs/Pleura: Moderate centrilobular and paraseptal emphysema. Spiculated, cavitary nodule in the anterior right upper lobe measuring 2.3 x 2.1 cm (series 4, image 45). Spiculated nodule in the right pulmonary apex measuring 2.1 x 1.7 cm, increased in size compared to prior examination dated 11/02/2022, at which time it measured 1.4 x 1.2 cm (series 4, image 26). Dependent bibasilar atelectasis or consolidation. No pleural effusion or pneumothorax. Upper Abdomen: No acute abnormality. Coarse, nodular cirrhotic morphology of the liver. Hypodense liver lesion in the hepatic dome as seen by prior CT of the abdomen pelvis (series 2, image 124). Hepatic steatosis. Musculoskeletal: No chest wall abnormality. No acute osseous findings. IMPRESSION: 1. Spiculated, cavitary nodule in the anterior right upper lobe measuring 2.3 x 2.1 cm, which may reflect primary lung malignancy or metastasis. 2. Spiculated nodule in the right pulmonary apex measuring 2.1 x 1.7 cm, increased in size compared to prior examination dated 11/02/2022, at which time it measured 1.4 x 1.2 cm. This is most consistent with a slowly enlarging primary lung malignancy although metastasis would remain a differential consideration in the setting of known rectal malignancy. 3. Emphysema. 4. Coronary artery disease. 5. Cirrhosis and hepatic steatosis. Indeterminate liver lesion as noted by prior CT of the abdomen and pelvis, highly suspicious for metastasis. Aortic Atherosclerosis (ICD10-I70.0) and Emphysema (ICD10-J43.9).  Electronically Signed   By: Marolyn JONETTA Jaksch M.D.   On: 05/22/2024 21:45   DG Abd Portable 1V-Small Bowel Obstruction Protocol-initial, 8 hr delay Result Date: 05/21/2024 CLINICAL DATA:  SBO- 8 hr picture EXAM: PORTABLE ABDOMEN - 1 VIEW COMPARISON:  CT abdomen pelvis 05/20/2024, x-ray abdomen 05/21/2024 FINDINGS: Enteric tube with tip and side port overlying the expected region the gastric lumen. PO contrast reaches the large bowel. PO contrast again noted within dilated small bowel. Gaseous dilatation of several loops of small bowel. No radio-opaque calculi or other significant radiographic abnormality are seen. IMPRESSION: Findings suggestive of partial small bowel obstruction. Electronically Signed   By: Morgane  Naveau M.D.   On: 05/21/2024 20:01        Scheduled Meds:  folic acid   1 mg Oral Daily   pantoprazole  (  PROTONIX ) IV  40 mg Intravenous Q12H   polyethylene glycol  17 g Oral Daily   thiamine  (VITAMIN B1) injection  100 mg Intravenous Daily   Continuous Infusions:  cefTRIAXone  (ROCEPHIN )  IV 1 g (05/23/24 1349)      LOS: 3 days     Devaughn KATHEE Ban, MD [facility]   If 7PM-7AM, please contact night-coverage [email or web address] Password [email or web address] 05/23/2024, 4:30 PM

## 2024-05-23 NOTE — Progress Notes (Signed)
 [facility]- General Surgery  SURGICAL PROGRESS NOTE  Hospital Day(s): 3.   Interval History:  Patient seen and examined No acute events or new complaints overnight.  Patient reports feeling well. Continues to have bowel movements in colostomy bag. Started full liquid diet in the morning and was advanced to soft for lunch. Denies any nausea or vomiting.    Vital signs in last 24 hours: [min-max] current  Temp:  [98.1 F (36.7 C)-98.7 F (37.1 C)] 98.3 F (36.8 C) (07/10 1458) Pulse Rate:  [73-85] 85 (07/10 1458) Resp:  [16-18] 16 (07/10 1458) BP: (114-138)/(64-88) 114/88 (07/10 1458) SpO2:  [92 %-94 %] 92 % (07/10 1458) Weight:  [85.9 kg] 85.9 kg (07/10 0406)     Height: 5' 7 (170.2 cm) Weight: 85.9 kg BMI (Calculated): 29.65   Intake/Output last 2 shifts:  07/09 0701 - 07/10 0700 In: 240 [P.O.:240] Out: 400 [Emesis/NG output:400]   Physical Exam:  Constitutional: alert, cooperative and no distress  Respiratory: breathing non-labored at rest  Cardiovascular: regular rate and sinus rhythm  Gastrointestinal: soft, mildly tender, distended and tympanic with percussion   Labs:     Latest Ref Rng & Units 05/22/2024    4:54 AM 05/21/2024    4:59 AM 05/20/2024   10:58 PM  CBC  WBC 4.0 - 10.5 K/uL 8.1  13.7  13.5   Hemoglobin 13.0 - 17.0 g/dL 85.5  84.4  82.7   Hematocrit 39.0 - 52.0 % 43.1  45.7  49.6   Platelets 150 - 400 K/uL 150  194  227       Latest Ref Rng & Units 05/23/2024    2:01 AM 05/22/2024    4:54 AM 05/21/2024    4:59 AM  CMP  Glucose 70 - 99 mg/dL 99  80  868   BUN 8 - 23 mg/dL 18  24  27    Creatinine 0.61 - 1.24 mg/dL 8.79  8.87  8.55   Sodium 135 - 145 mmol/L 140  144  143   Potassium 3.5 - 5.1 mmol/L 3.4  3.7  4.1   Chloride 98 - 111 mmol/L 102  108  102   CO2 22 - 32 mmol/L 26  25  29    Calcium 8.9 - 10.3 mg/dL 8.7  8.5  9.2     Imaging studies:  CLINICAL DATA:  History of rectal cancer with lung metastases. New right hepatic lobe lesion.    EXAM: MRI ABDOMEN WITHOUT AND WITH CONTRAST   TECHNIQUE: Multiplanar multisequence MR imaging of the abdomen was performed both before and after the administration of intravenous contrast.   CONTRAST:  8mL GADAVIST  GADOBUTROL  1 MMOL/ML IV SOLN   COMPARISON:  CT scan 05/20/2024   FINDINGS: Lower chest: Minimal atelectasis at the right base.   Hepatobiliary: Ill-defined T1 hypointense, T2 hyperintense lesion in the right hepatic lobe shows peripheral rim enhancement measuring approximately 1.9 x 1.9 cm.   4 mm T2 hyperintensity is identified in segment IV and a 2-3 mm T2 hyperintensity is identified in segment II. Neither lesion shows restricted diffusion or evidence of enhancement after IV contrast administration. Both were present on CT scan of 11/13/2022 and are compatible with tiny hepatic cysts. Multiple gallstones identified measuring up to at least 16 mm in size. Gas is identified in the lumen of the gallbladder, similar to prior CT in suggesting prior sphincterotomy. No intrahepatic or extrahepatic biliary dilation.   Pancreas: No focal mass lesion. No dilatation of the main duct. No intraparenchymal  cyst. No peripancreatic edema.   Spleen:  No splenomegaly. No suspicious focal mass lesion.   Adrenals/Urinary Tract: No adrenal nodule or mass. Simple cyst noted right kidney. Left kidney unremarkable.   Stomach/Bowel: Stomach is unremarkable. No gastric wall thickening. No evidence of outlet obstruction. Duodenum is normally positioned as is the ligament of Treitz. Mild gaseous small bowel dilatation noted within the visualized upper abdomen   Vascular/Lymphatic: Atherosclerotic disease noted in the abdominal aorta without aneurysm. There is no gastrohepatic or hepatoduodenal ligament lymphadenopathy. No retroperitoneal or mesenteric lymphadenopathy.   Other:  No intraperitoneal free fluid.   Musculoskeletal: No focal suspicious marrow enhancement within  the visualized bony anatomy.   IMPRESSION: 1. 1.9 x 1.9 cm ill-defined T1 hypointense, T2 hyperintense lesion in the right hepatic lobe shows peripheral rim enhancement. Given findings in the chest, imaging features are felt to be most compatible with metastatic disease. Hepatic abscess is not entirely excluded. 2. Cholelithiasis. Gas in the lumen of the gallbladder, similar to prior CT in suggesting prior sphincterotomy. In the absence of prior sphincterotomy, infection would be a concern. 3. Mild gaseous small bowel dilatation within the visualized upper abdomen consistent with small-bowel obstruction documented on prior imaging. 4.  Aortic Atherosclerois (ICD10-170.0)     Electronically Signed   By: Camellia Candle M.D.   On: 05/23/2024 05:21  Assessment/Plan:  78 y.o. male with partial small bowel obstruction, complicated by pertinent comorbidities including HFpEF, alcohol use, metastatic rectal cancer with Foley and colostomy, HTN, AKI, and UTI.    - Stable vital signs, no fever and not tachycardiac    - Although seems to be tolerating soft diet and having bowel movements, MRI of abdomen from yesterday still showed some small bowel dilatation and today he was more distended and tympanic. Will monitor for another night. If he continues to have bowel movements and feeling well, plan for discharge tomorrow. Patient and wife agree with plan.   -  Continue pain management and DVT prophylaxis   -- Sabriel Borromeo Barrientos PA-C

## 2024-05-24 ENCOUNTER — Inpatient Hospital Stay

## 2024-05-24 ENCOUNTER — Other Ambulatory Visit: Payer: Self-pay

## 2024-05-24 ENCOUNTER — Encounter: Payer: Self-pay | Admitting: Internal Medicine

## 2024-05-24 ENCOUNTER — Encounter
Admission: EM | Disposition: A | Discharge status: Home Health | Payer: Self-pay | Source: Home / Self Care | Attending: Internal Medicine

## 2024-05-24 DIAGNOSIS — K56609 Unspecified intestinal obstruction, unspecified as to partial versus complete obstruction: Secondary | ICD-10-CM | POA: Diagnosis not present

## 2024-05-24 HISTORY — PX: APPENDECTOMY: SHX54

## 2024-05-24 HISTORY — PX: BOWEL RESECTION: SHX1257

## 2024-05-24 HISTORY — PX: LYSIS OF ADHESION: SHX5961

## 2024-05-24 HISTORY — PX: LAPAROTOMY: SHX154

## 2024-05-24 LAB — BASIC METABOLIC PANEL WITH GFR
Anion gap: 10 (ref 5–15)
BUN: 13 mg/dL (ref 8–23)
CO2: 26 mmol/L (ref 22–32)
Calcium: 8.4 mg/dL — ABNORMAL LOW (ref 8.9–10.3)
Chloride: 102 mmol/L (ref 98–111)
Creatinine, Ser: 0.84 mg/dL (ref 0.61–1.24)
GFR, Estimated: >60 mL/min (ref >60)
Glucose, Bld: 97 mg/dL (ref 70–99)
Potassium: 3 mmol/L — ABNORMAL LOW (ref 3.5–5.1)
Sodium: 138 mmol/L (ref 135–145)

## 2024-05-24 LAB — GLUCOSE, CAPILLARY: Glucose-Capillary: 88 mg/dL (ref 70–99)

## 2024-05-24 SURGERY — LAPAROTOMY, EXPLORATORY
Anesthesia: General | Site: Abdomen

## 2024-05-24 MED ORDER — DEXAMETHASONE SODIUM PHOSPHATE 10 MG/ML IJ SOLN
INTRAMUSCULAR | Status: AC
  Filled 2024-05-24: qty 1

## 2024-05-24 MED ORDER — LIDOCAINE HCL (PF) 2 % IJ SOLN
INTRAMUSCULAR | Status: AC
  Filled 2024-05-24: qty 5

## 2024-05-24 MED ORDER — CHLORHEXIDINE GLUCONATE CLOTH 2 % EX PADS
6.0000 | MEDICATED_PAD | Freq: Every day | CUTANEOUS | Status: DC
  Administered 2024-05-25 – 2024-06-12 (×18): 6 via TOPICAL

## 2024-05-24 MED ORDER — SODIUM CHLORIDE 0.9 % IV SOLN
INTRAVENOUS | Status: DC | PRN
Start: 2024-05-24 — End: 2024-05-24

## 2024-05-24 MED ORDER — ROCURONIUM BROMIDE 100 MG/10ML IV SOLN
INTRAVENOUS | Status: DC | PRN
  Administered 2024-05-24: 50 mg via INTRAVENOUS
  Administered 2024-05-24: 20 mg via INTRAVENOUS

## 2024-05-24 MED ORDER — CHLORHEXIDINE GLUCONATE 0.12 % MT SOLN
OROMUCOSAL | Status: AC
  Filled 2024-05-24: qty 15

## 2024-05-24 MED ORDER — CEFAZOLIN SODIUM-DEXTROSE 2-3 GM-%(50ML) IV SOLR
INTRAVENOUS | Status: DC | PRN
  Administered 2024-05-24: 2 g via INTRAVENOUS

## 2024-05-24 MED ORDER — SUGAMMADEX SODIUM 200 MG/2ML IV SOLN
INTRAVENOUS | Status: DC | PRN
  Administered 2024-05-24: 180 mg via INTRAVENOUS

## 2024-05-24 MED ORDER — SODIUM CHLORIDE (PF) 0.9 % IJ SOLN
INTRAMUSCULAR | Status: AC
  Filled 2024-05-24: qty 20

## 2024-05-24 MED ORDER — FENTANYL CITRATE (PF) 100 MCG/2ML IJ SOLN
INTRAMUSCULAR | Status: AC
  Filled 2024-05-24: qty 2

## 2024-05-24 MED ORDER — POTASSIUM CHLORIDE 10 MEQ/100ML IV SOLN
10.0000 meq | INTRAVENOUS | Status: AC
  Administered 2024-05-24: 10 meq via INTRAVENOUS
  Filled 2024-05-24: qty 100

## 2024-05-24 MED ORDER — ROCURONIUM BROMIDE 10 MG/ML (PF) SYRINGE
PREFILLED_SYRINGE | INTRAVENOUS | Status: AC
  Filled 2024-05-24: qty 10

## 2024-05-24 MED ORDER — ONDANSETRON HCL 4 MG/2ML IJ SOLN
INTRAMUSCULAR | Status: AC
  Filled 2024-05-24: qty 2

## 2024-05-24 MED ORDER — HYDROMORPHONE HCL 1 MG/ML IJ SOLN
INTRAMUSCULAR | Status: DC | PRN
  Administered 2024-05-24: .25 mg via INTRAVENOUS
  Administered 2024-05-24: .5 mg via INTRAVENOUS
  Administered 2024-05-24: .25 mg via INTRAVENOUS

## 2024-05-24 MED ORDER — SODIUM CHLORIDE (PF) 0.9 % IJ SOLN
INTRAMUSCULAR | Status: AC
  Filled 2024-05-24: qty 10

## 2024-05-24 MED ORDER — ONDANSETRON HCL 4 MG/2ML IJ SOLN
INTRAMUSCULAR | Status: DC | PRN
  Administered 2024-05-24: 4 mg via INTRAVENOUS

## 2024-05-24 MED ORDER — ESMOLOL HCL 100 MG/10ML IV SOLN
INTRAVENOUS | Status: AC
  Filled 2024-05-24: qty 10

## 2024-05-24 MED ORDER — ACETAMINOPHEN 10 MG/ML IV SOLN
INTRAVENOUS | Status: AC
  Filled 2024-05-24: qty 100

## 2024-05-24 MED ORDER — 0.9 % SODIUM CHLORIDE (POUR BTL) OPTIME
TOPICAL | Status: DC | PRN
  Administered 2024-05-24 (×2): 1000 mL
  Administered 2024-05-24: 500 mL
  Administered 2024-05-24 (×2): 1000 mL

## 2024-05-24 MED ORDER — PROPOFOL 10 MG/ML IV BOLUS
INTRAVENOUS | Status: DC | PRN
  Administered 2024-05-24: 110 mg via INTRAVENOUS

## 2024-05-24 MED ORDER — OXYCODONE HCL 5 MG PO TABS: 5.0000 mg | ORAL_TABLET | Freq: Once | ORAL | Status: DC | PRN

## 2024-05-24 MED ORDER — PHENYLEPHRINE HCL-NACL 20-0.9 MG/250ML-% IV SOLN
INTRAVENOUS | Status: AC
  Filled 2024-05-24: qty 250

## 2024-05-24 MED ORDER — PHENYLEPHRINE 80 MCG/ML (10ML) SYRINGE FOR IV PUSH (FOR BLOOD PRESSURE SUPPORT)
PREFILLED_SYRINGE | INTRAVENOUS | Status: DC | PRN
  Administered 2024-05-24 (×2): 80 ug via INTRAVENOUS
  Administered 2024-05-24: 120 ug via INTRAVENOUS
  Administered 2024-05-24: 40 ug via INTRAVENOUS
  Administered 2024-05-24: 80 ug via INTRAVENOUS

## 2024-05-24 MED ORDER — BUPIVACAINE LIPOSOME 1.3 % IJ SUSP
INTRAMUSCULAR | Status: AC
Start: 2024-05-24 — End: 2024-05-24
  Filled 2024-05-24: qty 20

## 2024-05-24 MED ORDER — CEFAZOLIN SODIUM-DEXTROSE 2-4 GM/100ML-% IV SOLN
2.0000 g | Freq: Once | INTRAVENOUS | Status: DC
  Filled 2024-05-24: qty 100

## 2024-05-24 MED ORDER — SODIUM CHLORIDE (PF) 0.9 % IJ SOLN
INTRAMUSCULAR | Status: AC
Start: 2024-05-24 — End: 2024-05-24
  Filled 2024-05-24: qty 50

## 2024-05-24 MED ORDER — ESMOLOL HCL 100 MG/10ML IV SOLN
INTRAVENOUS | Status: DC | PRN
  Administered 2024-05-24: 2.5 mg via INTRAVENOUS
  Administered 2024-05-24 (×3): 5 mg via INTRAVENOUS
  Administered 2024-05-24: 2.5 mg via INTRAVENOUS
  Administered 2024-05-24: 5 mg via INTRAVENOUS

## 2024-05-24 MED ORDER — PROPOFOL 500 MG/50ML IV EMUL
INTRAVENOUS | Status: DC | PRN
  Administered 2024-05-24: 25 ug/kg/min via INTRAVENOUS

## 2024-05-24 MED ORDER — KETOROLAC TROMETHAMINE 0.5 % OP SOLN
1.0000 [drp] | Freq: Four times a day (QID) | OPHTHALMIC | Status: DC
  Administered 2024-05-24 – 2024-06-12 (×72): 1 [drp] via OPHTHALMIC
  Filled 2024-05-24: qty 3

## 2024-05-24 MED ORDER — CEFAZOLIN SODIUM-DEXTROSE 2-4 GM/100ML-% IV SOLN
INTRAVENOUS | Status: AC
  Filled 2024-05-24: qty 100

## 2024-05-24 MED ORDER — CHLORHEXIDINE GLUCONATE 0.12 % MT SOLN
15.0000 mL | Freq: Once | OROMUCOSAL | Status: AC
  Administered 2024-05-24: 15 mL via OROMUCOSAL

## 2024-05-24 MED ORDER — MORPHINE SULFATE (PF) 4 MG/ML IV SOLN
4.0000 mg | INTRAVENOUS | Status: DC | PRN
  Administered 2024-05-25 – 2024-06-03 (×23): 4 mg via INTRAVENOUS
  Filled 2024-05-24 (×24): qty 1

## 2024-05-24 MED ORDER — FENTANYL CITRATE (PF) 100 MCG/2ML IJ SOLN
25.0000 ug | INTRAMUSCULAR | Status: DC | PRN
  Administered 2024-05-24: 25 ug via INTRAVENOUS
  Administered 2024-05-24: 50 ug via INTRAVENOUS
  Administered 2024-05-24: 25 ug via INTRAVENOUS

## 2024-05-24 MED ORDER — PROPOFOL 1000 MG/100ML IV EMUL
INTRAVENOUS | Status: AC
  Filled 2024-05-24: qty 100

## 2024-05-24 MED ORDER — HYDROMORPHONE HCL 1 MG/ML IJ SOLN
INTRAMUSCULAR | Status: AC
  Filled 2024-05-24: qty 1

## 2024-05-24 MED ORDER — MORPHINE SULFATE (PF) 2 MG/ML IV SOLN
2.0000 mg | Freq: Once | INTRAVENOUS | Status: AC
  Administered 2024-05-24: 2 mg via INTRAVENOUS
  Filled 2024-05-24: qty 1

## 2024-05-24 MED ORDER — ACETAMINOPHEN 10 MG/ML IV SOLN
INTRAVENOUS | Status: DC | PRN
  Administered 2024-05-24: 1000 mg via INTRAVENOUS

## 2024-05-24 MED ORDER — SODIUM CHLORIDE (PF) 0.9 % IJ SOLN
INTRAMUSCULAR | Status: DC | PRN
  Administered 2024-05-24: 80 mL

## 2024-05-24 MED ORDER — OXYCODONE HCL 5 MG/5ML PO SOLN: 5.0000 mg | Freq: Once | ORAL | Status: DC | PRN

## 2024-05-24 MED ORDER — LIDOCAINE HCL (CARDIAC) PF 100 MG/5ML IV SOSY
PREFILLED_SYRINGE | INTRAVENOUS | Status: DC | PRN
  Administered 2024-05-24: 80 mg via INTRAVENOUS

## 2024-05-24 MED ORDER — BUPIVACAINE HCL (PF) 0.5 % IJ SOLN
INTRAMUSCULAR | Status: AC
Start: 2024-05-24 — End: 2024-05-24
  Filled 2024-05-24: qty 30

## 2024-05-24 MED ORDER — PHENYLEPHRINE HCL-NACL 20-0.9 MG/250ML-% IV SOLN
INTRAVENOUS | Status: DC | PRN
  Administered 2024-05-24: 50 ug/min via INTRAVENOUS

## 2024-05-24 MED ORDER — POTASSIUM CHLORIDE 10 MEQ/100ML IV SOLN
10.0000 meq | INTRAVENOUS | Status: AC
  Administered 2024-05-24 – 2024-05-25 (×3): 10 meq via INTRAVENOUS
  Filled 2024-05-24 (×3): qty 100

## 2024-05-24 MED ORDER — MIDAZOLAM HCL 2 MG/2ML IJ SOLN
INTRAMUSCULAR | Status: AC
  Filled 2024-05-24: qty 2

## 2024-05-24 MED ORDER — EPINEPHRINE PF 1 MG/ML IJ SOLN
INTRAMUSCULAR | Status: AC
  Filled 2024-05-24: qty 1

## 2024-05-24 MED ORDER — FENTANYL CITRATE (PF) 100 MCG/2ML IJ SOLN
INTRAMUSCULAR | Status: DC | PRN
  Administered 2024-05-24 (×4): 25 ug via INTRAVENOUS

## 2024-05-24 MED ORDER — DEXAMETHASONE SODIUM PHOSPHATE 10 MG/ML IJ SOLN
INTRAMUSCULAR | Status: DC | PRN
  Administered 2024-05-24: 4 mg via INTRAVENOUS

## 2024-05-24 SURGICAL SUPPLY — 43 items
CHLORAPREP W/TINT 26 (MISCELLANEOUS) IMPLANT
DRAPE LAPAROTOMY 100X77 ABD (DRAPES) ×1 IMPLANT
DRSG OPSITE POSTOP 4X10 (GAUZE/BANDAGES/DRESSINGS) IMPLANT
DRSG OPSITE POSTOP 4X8 (GAUZE/BANDAGES/DRESSINGS) IMPLANT
ELECT BLADE 6.5 EXT (BLADE) ×1 IMPLANT
ELECTRODE REM PT RTRN 9FT ADLT (ELECTROSURGICAL) ×1 IMPLANT
GLOVE BIO SURGEON STRL SZ 6.5 (GLOVE) ×1 IMPLANT
GLOVE BIOGEL PI IND STRL 6.5 (GLOVE) ×1 IMPLANT
GLOVE SURG SYN 6.5 PF PI (GLOVE) ×2 IMPLANT
GOWN STRL REUS W/ TWL LRG LVL3 (GOWN DISPOSABLE) ×3 IMPLANT
KIT OSTOMY 2 PC DRNBL 2.25 STR (WOUND CARE) IMPLANT
KIT PREVENA INCISION MGT20CM45 (CANNISTER) IMPLANT
KIT TURNOVER KIT A (KITS) ×1 IMPLANT
LABEL OR SOLS (LABEL) ×1 IMPLANT
LIGASURE IMPACT 36 18CM CVD LR (INSTRUMENTS) IMPLANT
MANIFOLD NEPTUNE II (INSTRUMENTS) ×1 IMPLANT
NDL HYPO 22X1.5 SAFETY MO (MISCELLANEOUS) ×1 IMPLANT
NEEDLE HYPO 22X1.5 SAFETY MO (MISCELLANEOUS) ×1 IMPLANT
NS IRRIG 1000ML POUR BTL (IV SOLUTION) ×1 IMPLANT
PACK BASIN MAJOR ARMC (MISCELLANEOUS) ×1 IMPLANT
PACK COLON CLEAN CLOSURE (MISCELLANEOUS) ×1 IMPLANT
RELOAD BL CONTOUR (ENDOMECHANICALS) IMPLANT
RELOAD PROXIMATE 75MM BLUE (ENDOMECHANICALS) ×2 IMPLANT
RELOAD STAPLE 40 BLU REG (ENDOMECHANICALS) IMPLANT
RELOAD STAPLE 75 3.8 BLU REG (ENDOMECHANICALS) IMPLANT
RETRACTOR WND ALEXIS-O 25 LRG (MISCELLANEOUS) IMPLANT
SPONGE T-LAP 18X18 ~~LOC~~+RFID (SPONGE) IMPLANT
STAPLER CVD CUT BL 40 RELOAD (ENDOMECHANICALS) IMPLANT
STAPLER CVD CUT BLU 40 RELOAD (ENDOMECHANICALS) IMPLANT
STAPLER GUN LINEAR PROX 60 (STAPLE) IMPLANT
STAPLER PROXIMATE 75MM BLUE (STAPLE) IMPLANT
STAPLER SKIN PROX 35W (STAPLE) ×1 IMPLANT
SUT PDS AB 0 CT1 27 (SUTURE) IMPLANT
SUT SILK 2-0 18XBRD TIE 12 (SUTURE) IMPLANT
SUT SILK 3 0 SH CR/8 (SUTURE) IMPLANT
SUT STRATA 2-0 23CM CT-2 (SUTURE) IMPLANT
SUT STRATAFIX PDS 30 CT-1 (SUTURE) IMPLANT
SUT VIC AB 2-0 SH 27XBRD (SUTURE) IMPLANT
SUT VIC AB 3-0 SH 27X BRD (SUTURE) ×1 IMPLANT
SYR 20ML LL LF (SYRINGE) ×2 IMPLANT
TRAP FLUID SMOKE EVACUATOR (MISCELLANEOUS) ×1 IMPLANT
TRAY FOLEY MTR SLVR 16FR STAT (SET/KITS/TRAYS/PACK) ×1 IMPLANT
WATER STERILE IRR 500ML POUR (IV SOLUTION) ×1 IMPLANT

## 2024-05-24 NOTE — Transfer of Care (Signed)
 Immediate Anesthesia Transfer of Care Note  Patient: Stephen [name]  Procedure(s) Performed: LAPAROTOMY, EXPLORATORY (Abdomen) EXCISION, SMALL INTESTINE (Abdomen) APPENDECTOMY (Abdomen) LAPAROTOMY, FOR LYSIS OF ADHESIONS (Abdomen)  Patient Location: PACU  Anesthesia Type:General  Level of Consciousness: drowsy and patient cooperative  Airway & Oxygen Therapy: Patient Spontanous Breathing and Patient connected to face mask oxygen  Post-op Assessment: Report given to RN and Post -op Vital signs reviewed and stable  Post vital signs: Reviewed and stable  Last Vitals:  Vitals Value Taken Time  BP 120/82 05/24/24 18:45  Temp    Pulse 103 05/24/24 18:51  Resp 18 05/24/24 18:51  SpO2 100 % 05/24/24 18:51  Vitals shown include unfiled device data.  Last Pain:  Vitals:   05/24/24 1350  TempSrc: Temporal  PainSc: 0-No pain         Complications: No notable events documented.

## 2024-05-24 NOTE — Op Note (Signed)
 Preoperative diagnosis: Small bowel obstruction.   Postoperative diagnosis: Small bowel obstruction.  Procedure: Exploratory laparotomy with small bowel resection.                      Appendectomy  Anesthesia: GETA  Surgeon: Dr. Rodolph, MD  Wound Classification: Clean Contaminated  Indications: Patient is a 78 y.o. male with signs and symptoms of small bowel obstruction that did not resolved with conservative management and progressed to high grade bowel obstruction.   Findings: Dense adhesions to the pelvis with transition point very low in the pelvis.  Description of procedure: The patient was placed in the supine position and general endotracheal anesthesia was induced. A timeout was completed verifying correct patient, procedure, site, positioning, and implant(s) and/or special equipment prior to beginning this procedure. Preoperative antibiotics were given. A Foley catheter and nasogastric tube were placed. The abdomen was prepped and draped in the usual sterile fashion.  The colostomy was closed externally with a pursestring with 2-0 Vicryl.  Ioban drape placed in the abdomen.  After a timeout was performed, a vertical midline incision was made from xiphoid to pubis. This was deepened through the subcutaneous tissues and hemostasis was achieved with electrocautery. The linea alba was identified and incised and the peritoneal cavity entered.  A very difficult and time-consuming lysis of adhesions from intestine to the anterior abdominal cavity was done.  Although there was abundant amount of adhesions to the anterior abdominal cavity transition point was identified.  The small bowel was inspected from Treitz to ileocecal valve.  A loop of bowel was adhered densely to the sacrum.  It was very deep in the pelvis and unable to separated due to the density of the scar tissue.  A serosal tear was done so small bowel resection was needed to be done to safely resolve the small bowel  obstruction.  It was decided to proceed with small bowel resection of the terminal ileum.  A window was created by using a curved hemostat to separate the mesentery from the bowel at each resection margin. The mesentery was scored and serially divided with LigaSure device.   The bowel was divided with a cutting linear stapler at each resection margin and passed off the table as a specimen.  An appendectomy was needed to be done since he was located at the location of the anastomosis.  The base of the appendix was ligated with 2-0 silk.  The mesentery of the appendix was divided with the LigaSure.  The appendix was divided with scissors and sent as a specimen.  Then, the antimesenteric angles of the proximal and distal (in this case over the cecum) segments were then approximated with two sutures of 3-0 silk placed approximately 5 cm apart. Enterotomies were made at the antimesenteric borders and the cutting linear stapler inserted and fired. The lumen was inspected for hemostasis. The enterotomies were closed with a single firing of a linear stapler.  The anastomosis was then inspected for patency and integrity. The mesenteric defect was closed with a running 3-0 Vicryl suture. The abdomen was irrigated with 4 L of saline. The remaining small bowel appeared viable.  After the sponge needle and instrument count was correct, the fascia was closed with a running suture of  PDS 0. The skin was closed with skin staples.  A- pressure dressing (DME) covering 20 x 2 cm (40 cm) was placed in layers.  The patient tolerated the procedure well and was taken to the postanesthesia  care unit in stable condition.   Specimen: Small bowel                    Appendix  Complications: None  Estimated Blood Loss: 100 mL

## 2024-05-24 NOTE — Anesthesia Procedure Notes (Signed)
 Procedure Name: Intubation Date/Time: 05/24/2024 3:24 PM  Performed by: Dyane Mass, CRNAPre-anesthesia Checklist: Patient identified, Emergency Drugs available, Suction available and Patient being monitored Patient Re-evaluated:Patient Re-evaluated prior to induction Oxygen Delivery Method: Circle system utilized Preoxygenation: Pre-oxygenation with 100% oxygen Induction Type: IV induction Ventilation: Mask ventilation without difficulty Laryngoscope Size: McGrath and 4 Grade View: Grade I Tube type: Oral Tube size: 7.0 mm Number of attempts: 1 Airway Equipment and Method: Stylet and Oral airway Placement Confirmation: ETT inserted through vocal cords under direct vision, positive ETCO2 and breath sounds checked- equal and bilateral Secured at: 21 cm Tube secured with: Tape Dental Injury: Teeth and Oropharynx as per pre-operative assessment

## 2024-05-24 NOTE — Progress Notes (Signed)
 Initial Nutrition Assessment  DOCUMENTATION CODES:   Not applicable  INTERVENTION:   Recommend TPN initiation   Thiamine , folic acid  and MVI daily   Pt at high refeed risk  Daily weights   Check vitamin D  and B6 level   NUTRITION DIAGNOSIS:   Inadequate oral intake related to acute illness as evidenced by NPO status.  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  RD unable to see patient today as pt in the OR at the time of RD visit. Per chart review, pt noted to have vomiting and abdominal pain for 4 days pta. Pt also reported constipation with no ostomy output since 7/3. Pt has remained on a NPO/liquid diet since admission and is now without adequate nutrition for > 7 days. Pt only eating bites in hospital. Plan is for exploratory laparotomy today. Would recommend TPN to support post op healing as pt will not likely be advanced to a regular diet for several days post op; this was discussed with medical team. Pt is at high refeed risk. There is no recent documented weight history to determine if any significant changes. RD will obtain exam and history at follow-up.   Pt with ongoing weakness. Family reports pt is bedbound and has neuropathy at baseline. Will check vitamin D  and B6 levels. Pt already receiving thiamine  supplementation. B12 and folate wnl.     Medications reviewed and include: folic acid , protonix , miralax , thiamine , ceftriaxone , KCl  Labs reviewed: K 3.0(L) B12- 1724(H), folate 12.1- 7/9  NUTRITION -  FOCUSED PHYSICAL EXAM: Unable to perform at this time   Diet Order:   Diet Order             Diet NPO time specified Except for: Ice Chips, Sips with Meds  Diet effective now                  EDUCATION NEEDS:   Not appropriate for education at this time  Skin:  Skin Assessment: Reviewed RN Assessment (wound right hand)  Last BM:  7/4  Height:   Ht Readings from Last 1 Encounters:  05/24/24 5' 7 (1.702 m)    Weight:   Wt Readings from Last 1 Encounters:  05/24/24 86.1 kg    Ideal Body Weight:  67.2 kg  BMI:  Body mass index is 29.73 kg/m.  Estimated Nutritional Needs:   Kcal:  1900-2200kcal/day  Protein:  95-110g/day  Fluid:  1.7-2.0L/day  Augustin Shams MS, RD, LDN If unable to be reached, please send secure chat to RD inpatient available from 8:00a-4:00p daily

## 2024-05-24 NOTE — Progress Notes (Signed)
 VAST consult received to obtain IV access. Current access is leaking and painful. Assessed and removed PIV in left forearm and PIV in left AC. OR staff walked in door to take patient down to OR before VAST RN was able to place new access.

## 2024-05-24 NOTE — Anesthesia Preprocedure Evaluation (Addendum)
 Anesthesia Evaluation  Patient identified by MRN, date of birth, ID band Patient awake    Reviewed: Allergy & Precautions, NPO status , Patient's Chart, lab work & pertinent test results  History of Anesthesia Complications Negative for: history of anesthetic complications  Airway Mallampati: III  TM Distance: >3 FB Neck ROM: full    Dental  (+) Chipped   Pulmonary Patient abstained from smoking., former smoker   Pulmonary exam normal        Cardiovascular hypertension, On Medications Normal cardiovascular exam     Neuro/Psych  PSYCHIATRIC DISORDERS  Depression     Neuromuscular disease    GI/Hepatic Neg liver ROS,GERD  ,,SBO   Endo/Other  negative endocrine ROS    Renal/GU ARFRenal disease     Musculoskeletal   Abdominal   Peds  Hematology negative hematology ROS (+)   Anesthesia Other Findings Past Medical History: No date: Allergic rhinitis 2015: Blood clot in vein     Comment:  left leg  No date: History of anemia No date: History of chicken pox No date: HTN (hypertension) No date: Neuropathy     Comment:  arms and legs - S/P Cancer tx 09-2013: Rectal cancer metastasized to lung [facility])     Comment:  surgery and chemo No date: Smoker  Past Surgical History: 2015: COLON RESECTION 2015: COLONOSCOPY 11/10/2022: ENDOSCOPIC RETROGRADE CHOLANGIOPANCREATOGRAPHY (ERCP)  WITH PROPOFOL ; N/A     Comment:  Procedure: ENDOSCOPIC RETROGRADE               CHOLANGIOPANCREATOGRAPHY (ERCP) WITH PROPOFOL ;  Surgeon:               Abran Norleen SAILOR, MD;  Location: [facility] ENDOSCOPY;  Service:               Gastroenterology;  Laterality: N/A; 11/13/2022: ERCP; N/A     Comment:  Procedure: ENDOSCOPIC RETROGRADE               CHOLANGIOPANCREATOGRAPHY (ERCP);  Surgeon: Abran Norleen SAILOR,              MD;  Location: [facility] ENDOSCOPY;  Service: Gastroenterology;               Laterality: N/A; 11/13/2022: ESOPHAGOGASTRODUODENOSCOPY; N/A      Comment:  Procedure: ESOPHAGOGASTRODUODENOSCOPY (EGD);  Surgeon:               Abran Norleen SAILOR, MD;  Location: [facility] ENDOSCOPY;  Service:               Gastroenterology;  Laterality: N/A; 11/15/2013: EUS; N/A     Comment:  Procedure: LOWER ENDOSCOPIC ULTRASOUND (EUS);  Surgeon:               Toribio SHAUNNA Cedar, MD;  Location: [facility];  Service:               Endoscopy;  Laterality: N/A; No date: LUNG BIOPSY No date: LUNG LOBECTOMY; Left 2015: lung removed     Comment:  left lower  09/20/2017: PLANTAR FASCIA RELEASE; Right     Comment:  Procedure: PLANTAR FASCIA [id number], excision plantar               fibroma right foot;  Surgeon: Ashley Soulier, DPM;                Location: [facility];  Service: Podiatry;                Laterality: Right; 11/13/2022: REMOVAL OF STONES  Comment:  Procedure: REMOVAL OF STONES;  Surgeon: Abran Norleen SAILOR,               MD;  Location: [facility] ENDOSCOPY;  Service: Gastroenterology;; 11/13/2022: ANNETT     Comment:  Procedure: SPHINCTEROTOMY;  Surgeon: Abran Norleen SAILOR, MD;               Location: [facility] ENDOSCOPY;  Service: Gastroenterology;; Childhood: TONSILLECTOMY AND ADENOIDECTOMY 2010: VARICOSE VEIN SURGERY  BMI    Body Mass Index: 29.73 kg/m      Reproductive/Obstetrics negative OB ROS                              Anesthesia Physical Anesthesia Plan  ASA: 3  Anesthesia Plan: General ETT   Post-op Pain Management: Toradol  IV (intra-op)*, Ofirmev  IV (intra-op)*, Dilaudid  IV and Ketamine IV*   Induction: Intravenous and Rapid sequence  PONV Risk Score and Plan: 2 and Ondansetron , Dexamethasone  and Treatment may vary due to age or medical condition  Airway Management Planned: Oral ETT  Additional Equipment:   Intra-op Plan:   Post-operative Plan: Extubation in OR  Informed Consent: I have reviewed the patients History and Physical, chart, labs and discussed the procedure including the risks, benefits and  alternatives for the proposed anesthesia with the patient or authorized representative who has indicated his/her understanding and acceptance.     Dental Advisory Given  Plan Discussed with: Anesthesiologist, CRNA and Surgeon  Anesthesia Plan Comments: (Patient consented for risks of anesthesia including but not limited to:  - adverse reactions to medications - damage to eyes, teeth, lips or other oral mucosa - nerve damage due to positioning  - sore throat or hoarseness - Damage to heart, brain, nerves, lungs, other parts of body or loss of life  Patient voiced understanding and assent.)         Anesthesia Quick Evaluation

## 2024-05-24 NOTE — Progress Notes (Signed)
 Progress Note   Patient: Stephen [name] FMW:[id number] DOB: [birth year] DOA: 05/20/2024     4 DOS: the patient was seen and examined on 05/24/2024   Brief hospital course:   From admission h and p   Manford Sprong is a 78 y.o. 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  His abdominal pain is mainly in the upper abdomen, constant, mild to moderate, aching, nonradiating, not aggravated or alleviated by any known factors. He has nausea and multiple episodes of vomiting with coffee-ground emesis.  He still has small amount of output from colostomy.  No fever or chills.  Denies chest pain or SOB.  Patient has mild dry cough.  He uses condom catheter and reports burning on urination, no dysuria or hematuria.   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 Last oncology visit in 2022, lost to f/u, wife says can't transport him to doctor appointments. Here CT of abdomen/pelvis shows left lung base  nodule as well as liver nodule. MRI of liver and CT of chest consistent with probable malignancy (metastases vs new primary) Discussed with Dr. Christa and at this point no more intervention planned Oncologist have discussed with patient as well as patient's wife.  They note this is likely recurrent cancer and that wife have been bed ridden diarrhea [facility]hen oncology can offer.  Patient does not want a biopsy.  Wife also not ready for hospice. They have agreed that once his SBO is manage patient will be discharged home.   # AKI # Lactic acidosis Likely prerenal 2/2 above process. Resolved   # Acute cystitis Ua suggestive of possible infection and does endorse recent dysuria. Culture growing multiple species - continue ceftriaxone  until reliably taking po - will plan on d/c with keflex   # Bedbound Wife says this way last 5 years   # HTN Bp normal, doesn't appear to be on meds at home   # Alcohol use Few drinks a day, denies withdrawal history, no s/s of such here - monitor   # Macrocytosis B12 and folate wnl     DVT prophylaxis: scds Code Status: dnr/dni Family Communication: wife dora updated at bedside 7/10   Level of care: Telemetry Medical Status is: Inpatient Remains inpatient appropriate because: severity of illness       Consultants:  Gen surg   Procedures: NG tube placement   Antimicrobials:  none      Subjective: Patient seen and examined at bedside this morning Abdominal x-ray reviewed showing findings of bowel obstruction General Surgery  planning surgery today     Examination:   General exam: Appears calm and comfortable  Respiratory system: Clear to auscultation. Respiratory effort normal. Cardiovascular system: S1 & S2 heard, RRR.  Gastrointestinal system: Abdomen is distended, soft and nontender. Ostomy bag with small amount brown stool. Central nervous system: Alert and oriented. No focal neurological deficits. Extremities: warm.  Decreased power and muscle mass in LEs Skin: No rashes, lesions or ulcers Psychiatry: Judgement and insight appear normal. Mood & affect appropriate.       Vitals:   05/23/24 2137 05/24/24 0406 05/24/24 0854 05/24/24 1350  BP: 133/85 134/78 136/75 (!) 143/82  Pulse: 86 78 80 93  Resp: 18 20 16 16   Temp: 98.9 F (37.2 C) 98.7 F (37.1 C) 98.7 F (37.1 C) 98.8 F (37.1 C)  TempSrc: Oral Oral Oral Temporal  SpO2: 93% 92% 97% 94%  Weight:  86.1 kg  86.1 kg  Height:    5' 7 (1.702 m)    Data Reviewed:    Latest Ref Rng & Units 05/22/2024    4:54 AM 05/21/2024    4:59 AM 05/20/2024   10:58 PM  CBC  WBC 4.0 - 10.5 K/uL 8.1  13.7  13.5   Hemoglobin 13.0 - 17.0 g/dL 85.5  84.4  82.7   Hematocrit 39.0 - 52.0 % 43.1  45.7  49.6   Platelets 150 - 400 K/uL 150  194  227        Latest Ref Rng & Units 05/24/2024    5:01 AM 05/23/2024    2:01 AM 05/22/2024    4:54 AM  BMP  Glucose 70 - 99 mg/dL 97  99  80   BUN 8 - 23 mg/dL 13  18  24    Creatinine 0.61 - 1.24 mg/dL 9.15  8.79  8.87   Sodium 135 - 145 mmol/L 138  140  144   Potassium 3.5 - 5.1 mmol/L 3.0  3.4  3.7   Chloride 98 - 111 mmol/L 102  102  108   CO2 22 - 32 mmol/L 26  26  25    Calcium 8.9 - 10.3 mg/dL 8.4  8.7  8.5       Author: Drue ONEIDA Potter, MD 05/24/2024 4:30 PM  For on call review [email or web address].

## 2024-05-24 NOTE — Progress Notes (Incomplete)
 NG placed with success in one try to low wall suction. Xray ordered for placement.  Stephen [name] Louder

## 2024-05-24 NOTE — Consult Note (Signed)
 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  INTERVAL HISTORY: Patient is a 78 year old male who has not been evaluated in the cancer center in several years and by report is completely bedbound secondary to profound weakness.  He recently presented to the emergency room with increasing abdominal pain found to have a small bowel obstruction.  Incidentally noted on imaging was a liver lesion and 2 lung lesions highly concerning for metastatic disease.  He feels improved since admission, but not back to baseline.  He has no neurologic complaints.  He denies any recent fevers or illnesses.  He has a fair appetite, but denies weight loss.  He has no chest pain, shortness of breath, cough, or hemoptysis.  His abdominal pain is improved.  He does not complain of any further nausea or vomiting.  He denies any constipation or diarrhea.  He has no urinary complaints.  Patient offers no further specific complaints today.  REVIEW OF SYSTEMS:   Review of Systems  Constitutional:  Positive for malaise/fatigue. Negative for fever and weight loss.  Respiratory: Negative.  Negative for cough, hemoptysis and shortness of breath.   Cardiovascular: Negative.  Negative for chest pain and leg swelling.  Gastrointestinal:  Positive for abdominal pain and nausea. Negative for blood in stool, constipation, diarrhea, melena and vomiting.  Genitourinary: Negative.  Negative for dysuria.  Musculoskeletal: Negative.  Negative for back pain.  Skin: Negative.  Negative for rash.  Neurological:  Positive for weakness. Negative for  dizziness, focal weakness and headaches.  Psychiatric/Behavioral: Negative.  The patient is not nervous/anxious.     As per HPI. Otherwise, a complete review of systems is negative.  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 [facility]) 09-2013   surgery and chemo   Smoker     PAST SURGICAL HISTORY: Past Surgical History:  Procedure Laterality Date   COLON RESECTION  2015   COLONOSCOPY  2015   ENDOSCOPIC RETROGRADE CHOLANGIOPANCREATOGRAPHY (ERCP) WITH PROPOFOL  N/A 11/10/2022   Procedure: ENDOSCOPIC RETROGRADE CHOLANGIOPANCREATOGRAPHY (ERCP) WITH PROPOFOL ;  Surgeon: Abran Norleen SAILOR, MD;  Location: [facility] ENDOSCOPY;  Service: Gastroenterology;  Laterality: N/A;   ERCP N/A 11/13/2022   Procedure: ENDOSCOPIC RETROGRADE CHOLANGIOPANCREATOGRAPHY (ERCP);  Surgeon: Abran Norleen SAILOR, MD;  Location: [facility] ENDOSCOPY;  Service: Gastroenterology;  Laterality: N/A;   ESOPHAGOGASTRODUODENOSCOPY N/A 11/13/2022   Procedure: ESOPHAGOGASTRODUODENOSCOPY (EGD);  Surgeon: Abran Norleen SAILOR, MD;  Location: [facility] ENDOSCOPY;  Service: Gastroenterology;  Laterality: N/A;   EUS N/A 11/15/2013   Procedure: LOWER ENDOSCOPIC ULTRASOUND (EUS);  Surgeon: Toribio SHAUNNA Cedar, MD;  Location: [facility];  Service: Endoscopy;  Laterality: N/A;   LUNG BIOPSY     LUNG LOBECTOMY Left    lung removed  2015   left lower    PLANTAR FASCIA RELEASE Right 09/20/2017   Procedure: PLANTAR FASCIA [id number], excision plantar fibroma right foot;  Surgeon: Ashley Soulier, DPM;  Location: [facility];  Service: Podiatry;  Laterality: Right;   REMOVAL OF STONES  11/13/2022   Procedure: REMOVAL OF STONES;  Surgeon: Abran Norleen SAILOR, MD;  Location: [facility] ENDOSCOPY;  Service: Gastroenterology;;   ANNETT  11/13/2022    Procedure: ANNETT;  Surgeon: Abran Norleen SAILOR, MD;  Location: [facility] ENDOSCOPY;  Service: Gastroenterology;;   TONSILLECTOMY AND ADENOIDECTOMY  Childhood   VARICOSE VEIN SURGERY  2010    FAMILY HISTORY: Family History  Problem Relation Age of Onset   Hypertension Mother    Coronary artery disease Mother        angina   Cancer Mother        breast   Cancer Father 45       esophageal   COPD Brother        emphysema   Cancer Paternal Grandmother        colon   Diabetes Neg Hx     ADVANCED DIRECTIVES (Y/N):  @ADVDIR @  HEALTH MAINTENANCE: Social History   Tobacco Use   Smoking status: Former    Current packs/day: 0.00    Average packs/day: 0.5 packs/day for 15.0 years (7.5 ttl pk-yrs)    Types: Cigarettes    Start date: 11/14/2005    Quit date: 11/14/2020    Years since quitting: 3.5   Smokeless tobacco: Never  Vaping Use   Vaping status: Never Used  Substance Use Topics   Alcohol use: Yes    Alcohol/week: 18.0 standard drinks of alcohol    Types: 18 Shots of liquor per week    Comment: Regular 2-3 scotch/day   Drug use: No     Colonoscopy:  PAP:  Bone density:  Lipid panel:  No Known Allergies  Current Facility-Administered Medications  Medication Dose Route Frequency Provider Last Rate Last Admin   acetaminophen  (TYLENOL ) suppository 650 mg  650 mg Rectal Q6H PRN Niu, Xilin, MD       ceFAZolin  (ANCEF ) IVPB 2g/100 mL premix  2 g Intravenous Once Rodolph Romano, MD       cefTRIAXone  (ROCEPHIN ) 1 g in sodium chloride  0.9 % 100 mL IVPB  1 g Intravenous Q24H Nazari, Walid A, RPH 200 mL/hr at 05/23/24 1349 1 g at 05/23/24 1349   folic acid  (FOLVITE ) tablet 1 mg  1 mg Oral Daily Niu, Xilin, MD   1 mg at 05/23/24 0847   hydrALAZINE  (APRESOLINE ) injection 5 mg  5 mg Intravenous Q2H PRN Niu, Xilin, MD       morphine  (PF) 2 MG/ML injection 1 mg  1 mg Intravenous Q4H PRN Niu, Xilin, MD   1 mg at 05/24/24 0951   ondansetron  (ZOFRAN ) injection 4 mg  4 mg Intravenous  Q8H PRN Niu, Xilin, MD       Oral care mouth rinse  15 mL Mouth Rinse PRN Niu, Xilin, MD       oxyCODONE -acetaminophen  (PERCOCET/ROXICET) 5-325 MG per tablet 1 tablet  1 tablet Oral Q4H PRN Niu, Xilin, MD       pantoprazole  (PROTONIX ) injection 40 mg  40 mg Intravenous Q12H Niu, Xilin, MD   40 mg at 05/23/24 2144   polyethylene glycol (MIRALAX  / GLYCOLAX ) packet 17 g  17 g Oral Daily Rodolph Romano, MD   17 g at 05/23/24 0847   potassium chloride  10 mEq in 100 mL IVPB  10 mEq Intravenous Q1 Hr x 4 Djan, Prince T, MD       thiamine  (VITAMIN B1) injection 100 mg  100 mg Intravenous Daily Niu, Xilin, MD   100 mg at 05/23/24 0847    OBJECTIVE: Vitals:   05/24/24 0406 05/24/24 0854  BP: 134/78 136/75  Pulse: 78 80  Resp: 20 16  Temp: 98.7 F (37.1 C) 98.7 F (37.1 C)  SpO2: 92% 97%     Body mass index is 29.73 kg/m.    ECOG FS:4 - Bedbound  General: Well-developed, well-nourished, no acute distress. Eyes: Pink conjunctiva, anicteric sclera. HEENT: Normocephalic, moist mucous membranes. Lungs: No audible wheezing or coughing. Heart: Regular rate and rhythm. Abdomen: Soft, nontender, no obvious distention. Musculoskeletal: No edema, cyanosis, or clubbing. Neuro: Alert, answering all questions appropriately. Cranial nerves grossly intact. Skin: No rashes or petechiae noted. Psych: Normal affect.  LAB RESULTS:  Lab Results  Component Value Date   NA 138 05/24/2024   K 3.0 (L) 05/24/2024   CL 102 05/24/2024   CO2 26 05/24/2024   GLUCOSE 97 05/24/2024   BUN 13 05/24/2024   CREATININE 0.84 05/24/2024   CALCIUM 8.4 (L) 05/24/2024   PROT 8.2 (H) 05/20/2024   ALBUMIN 3.5 05/20/2024   AST 34 05/20/2024   ALT 16 05/20/2024   ALKPHOS 99 05/20/2024   BILITOT 2.4 (H) 05/20/2024   GFRNONAA >60 05/24/2024   GFRAA >60 05/04/2020    Lab Results  Component Value Date   WBC 8.1 05/22/2024   NEUTROABS 14.5 (H) 11/13/2022   HGB 14.4 05/22/2024   HCT 43.1 05/22/2024   MCV 105.9  (H) 05/22/2024   PLT 150 05/22/2024     STUDIES: DG Abd 2 Views Result Date: 05/24/2024 CLINICAL DATA:  Small bowel obstruction EXAM: ABDOMEN - 2 VIEW COMPARISON:  Prior abdominal radiograph 05/21/2024 FINDINGS: Numerous loops of dilated and air-filled small bowel are present throughout the abdomen. Dilation measures up to 4.5 cm. No significant colonic distension. Findings are consistent with recurrent small bowel obstruction. Extensive chronic bronchitic changes again noted. No evidence of large free air. IMPRESSION: Recurrent high-grade small-bowel obstruction. Electronically Signed   By: Wilkie Lent M.D.   On: 05/24/2024 08:13   MR ABDOMEN W WO CONTRAST Result Date: 05/23/2024 CLINICAL DATA:  History of rectal cancer with lung metastases. New right hepatic lobe lesion. EXAM: MRI ABDOMEN WITHOUT AND WITH CONTRAST TECHNIQUE: Multiplanar multisequence MR imaging of the abdomen was performed both before and after the administration of intravenous contrast. CONTRAST:  8mL GADAVIST  GADOBUTROL  1 MMOL/ML IV SOLN COMPARISON:  CT scan 05/20/2024 FINDINGS: Lower chest: Minimal atelectasis at the right base. Hepatobiliary: Ill-defined T1 hypointense, T2 hyperintense lesion in the right hepatic lobe shows peripheral rim enhancement measuring approximately 1.9 x 1.9 cm. 4 mm T2 hyperintensity is identified in segment IV and a 2-3 mm T2 hyperintensity is identified in segment II. Neither lesion shows restricted diffusion or evidence of enhancement after IV contrast administration. Both were present on CT scan of 11/13/2022 and are compatible with tiny hepatic cysts. Multiple gallstones identified measuring up to at least 16 mm in size. Gas is identified in the lumen of the gallbladder, similar to prior CT in suggesting prior sphincterotomy. No intrahepatic or extrahepatic biliary dilation. Pancreas: No focal mass lesion. No dilatation of the main duct. No intraparenchymal cyst. No peripancreatic edema. Spleen:   No splenomegaly. No suspicious focal mass lesion. Adrenals/Urinary Tract: No adrenal nodule or mass. Simple cyst noted right kidney. Left kidney unremarkable. Stomach/Bowel: Stomach is unremarkable. No gastric wall thickening. No evidence of outlet obstruction. Duodenum is normally positioned as is the ligament of Treitz. Mild  gaseous small bowel dilatation noted within the visualized upper abdomen Vascular/Lymphatic: Atherosclerotic disease noted in the abdominal aorta without aneurysm. There is no gastrohepatic or hepatoduodenal ligament lymphadenopathy. No retroperitoneal or mesenteric lymphadenopathy. Other:  No intraperitoneal free fluid. Musculoskeletal: No focal suspicious marrow enhancement within the visualized bony anatomy. IMPRESSION: 1. 1.9 x 1.9 cm ill-defined T1 hypointense, T2 hyperintense lesion in the right hepatic lobe shows peripheral rim enhancement. Given findings in the chest, imaging features are felt to be most compatible with metastatic disease. Hepatic abscess is not entirely excluded. 2. Cholelithiasis. Gas in the lumen of the gallbladder, similar to prior CT in suggesting prior sphincterotomy. In the absence of prior sphincterotomy, infection would be a concern. 3. Mild gaseous small bowel dilatation within the visualized upper abdomen consistent with small-bowel obstruction documented on prior imaging. 4.  Aortic Atherosclerois (ICD10-170.0) Electronically Signed   By: Camellia Candle M.D.   On: 05/23/2024 05:21   CT CHEST W CONTRAST Result Date: 05/22/2024 CLINICAL DATA:  Pulmonary nodule history of rectal cancer * Tracking Code: BO * EXAM: CT CHEST WITH CONTRAST TECHNIQUE: Multidetector CT imaging of the chest was performed during intravenous contrast administration. RADIATION DOSE REDUCTION: This exam was performed according to the departmental dose-optimization program which includes automated exposure control, adjustment of the mA and/or kV according to patient size and/or use of  iterative reconstruction technique. CONTRAST:  75mL OMNIPAQUE  IOHEXOL  300 MG/ML  SOLN COMPARISON:  CT abdomen pelvis, 05/20/2024, CT chest, 11/10/2022 FINDINGS: Cardiovascular: Aortic atherosclerosis. Normal heart size. Three-vessel coronary artery calcifications. No pericardial effusion. Mediastinum/Nodes: No enlarged mediastinal, hilar, or axillary lymph nodes. Thyroid gland, trachea, and esophagus demonstrate no significant findings. Lungs/Pleura: Moderate centrilobular and paraseptal emphysema. Spiculated, cavitary nodule in the anterior right upper lobe measuring 2.3 x 2.1 cm (series 4, image 45). Spiculated nodule in the right pulmonary apex measuring 2.1 x 1.7 cm, increased in size compared to prior examination dated 11/02/2022, at which time it measured 1.4 x 1.2 cm (series 4, image 26). Dependent bibasilar atelectasis or consolidation. No pleural effusion or pneumothorax. Upper Abdomen: No acute abnormality. Coarse, nodular cirrhotic morphology of the liver. Hypodense liver lesion in the hepatic dome as seen by prior CT of the abdomen pelvis (series 2, image 124). Hepatic steatosis. Musculoskeletal: No chest wall abnormality. No acute osseous findings. IMPRESSION: 1. Spiculated, cavitary nodule in the anterior right upper lobe measuring 2.3 x 2.1 cm, which may reflect primary lung malignancy or metastasis. 2. Spiculated nodule in the right pulmonary apex measuring 2.1 x 1.7 cm, increased in size compared to prior examination dated 11/02/2022, at which time it measured 1.4 x 1.2 cm. This is most consistent with a slowly enlarging primary lung malignancy although metastasis would remain a differential consideration in the setting of known rectal malignancy. 3. Emphysema. 4. Coronary artery disease. 5. Cirrhosis and hepatic steatosis. Indeterminate liver lesion as noted by prior CT of the abdomen and pelvis, highly suspicious for metastasis. Aortic Atherosclerosis (ICD10-I70.0) and Emphysema (ICD10-J43.9).  Electronically Signed   By: Marolyn JONETTA Jaksch M.D.   On: 05/22/2024 21:45   DG Abd Portable 1V-Small Bowel Obstruction Protocol-initial, 8 hr delay Result Date: 05/21/2024 CLINICAL DATA:  SBO- 8 hr picture EXAM: PORTABLE ABDOMEN - 1 VIEW COMPARISON:  CT abdomen pelvis 05/20/2024, x-ray abdomen 05/21/2024 FINDINGS: Enteric tube with tip and side port overlying the expected region the gastric lumen. PO contrast reaches the large bowel. PO contrast again noted within dilated small bowel. Gaseous dilatation of several loops of small bowel. No  radio-opaque calculi or other significant radiographic abnormality are seen. IMPRESSION: Findings suggestive of partial small bowel obstruction. Electronically Signed   By: Morgane  Naveau M.D.   On: 05/21/2024 20:01   DG Abd Portable 1V Result Date: 05/21/2024 CLINICAL DATA:  NG tube placement EXAM: PORTABLE ABDOMEN - 1 VIEW COMPARISON:  CT abdomen and pelvis 05/20/2024 FINDINGS: Limited field of view for tube placement verification purposes. An enteric tube is present with tip projecting over the left upper quadrant consistent with location in the body of the stomach. Lung bases are clear. Limited visualization of bowel but gaseous distention is suggested. Degenerative changes in the spine. IMPRESSION: Enteric tube tip projects over the left upper quadrant consistent with location in the body of stomach. Electronically Signed   By: Elsie Gravely M.D.   On: 05/21/2024 00:19   CT ABDOMEN PELVIS W CONTRAST Result Date: 05/20/2024 CLINICAL DATA:  Sharp abdominal pain nausea vomiting history of colorectal cancer coffee-ground emesis, history of rectal cancer EXAM: CT ABDOMEN AND PELVIS WITH CONTRAST TECHNIQUE: Multidetector CT imaging of the abdomen and pelvis was performed using the standard protocol following bolus administration of intravenous contrast. RADIATION DOSE REDUCTION: This exam was performed according to the departmental dose-optimization program which includes  automated exposure control, adjustment of the mA and/or kV according to patient size and/or use of iterative reconstruction technique. CONTRAST:  80mL OMNIPAQUE  IOHEXOL  300 MG/ML  SOLN COMPARISON:  CT 11/13/2022, 11/08/2022, 02/19/2021, chest CT 11/10/2022 FINDINGS: Lower chest: Lung bases demonstrate mild subpleural reticulation and dependent atelectasis. 7 mm subpleural focus of nodularity or nodule left lung base, series 4, image 4. Coronary vascular calcification Hepatobiliary: Possible subtle surface nodularity of liver. Subcentimeter hypodensities too small to further characterize. Interim development of indeterminate hypodense lesion within the right hepatic lobe, 14 mm on series 2, image 12. Gallstones. Small volume of air within the gallbladder and common bile duct, correlate for interval history of sphincterotomy Pancreas: Unremarkable. No pancreatic ductal dilatation or surrounding inflammatory changes. Spleen: Normal in size without focal abnormality. Adrenals/Urinary Tract: Stable adrenal glands. No hydronephrosis. Renal cysts for which no imaging follow-up is recommended. Slightly thick-walled urinary bladder with mild mucosal enhancement. 10 mm stone within the posterior bladder. Stomach/Bowel: Moderate fluid distension of the stomach. Multiple dilated fluid-filled loops of small bowel measuring up to 3.8 cm consistent with a bowel obstruction. Apparent 2 transition points within close vicinity of each other within the anterior pelvis, series 2, image 53 and series 2, image 60. Some mesenteric congestion in the pelvis. No acute bowel wall thickening. Negative appendix. Status post low anterior resection with left abdominal colostomy. Vascular/Lymphatic: Advanced aortic atherosclerosis. No aneurysm. No suspicious lymph nodes. Reproductive: Negative prostate Other: Negative for free air. Similar presacral soft tissue thickening. No significant ascites. Musculoskeletal: Chronic compression fractures at  L4 and L5 and superior endplate of L1 IMPRESSION: 1. Findings consistent with small-bowel obstruction with 2 transition points within close vicinity of each other within the anterior pelvis, possibly due to adhesions but given appearance, closed loop physiology cannot be excluded. There is some mesenteric vascular congestion within the pelvis. 2. Status post low anterior resection with left abdominal colostomy. Similar presacral soft tissue thickening. 3. Interim development of 14 mm indeterminate hypodense lesion within the right hepatic lobe. Given history of rectal cancer, recommend further evaluation with nonemergent MRI of the abdomen with and without contrast. 4. Gallstones. Small volume of air within the gallbladder and common bile duct, correlate for interval history of sphincterotomy. 5. 10 mm stone  within the posterior bladder. Slightly thick-walled urinary bladder with mild mucosal enhancement, correlate with urinalysis to exclude cystitis. 6. 7 mm subpleural focus of nodularity or nodule at the left lung base. Short-term CT chest follow-up recommended to exclude developing pulmonary nodule. 7. Aortic atherosclerosis. Aortic Atherosclerosis (ICD10-I70.0). Electronically Signed   By: Luke Bun M.D.   On: 05/20/2024 20:16   DG Chest Portable 1 View Result Date: 05/20/2024 CLINICAL DATA:  Shortness of breath with questionable hypoxia and cough, evaluate for pneumonia in the setting of possible sepsis EXAM: PORTABLE CHEST 1 VIEW COMPARISON:  Chest x-ray 11/13/2022 FINDINGS: The heart and mediastinal contours are unchanged. Atherosclerotic plaque. No focal consolidation. No pulmonary edema. No pleural effusion. No pneumothorax. No acute osseous abnormality. IMPRESSION: 1. No active disease. 2.  Aortic Atherosclerosis (ICD10-I70.0). Electronically Signed   By: Morgane  Naveau M.D.   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  PLAN:     Metastatic rectal cancer: Patient last evaluated by a telemedicine visit in January 2022.  He was subsequently lost to follow-up secondary to being bedbound.  He has a history of stage IV rectal cancer with an isolated metastasis in the left lung.  He finished chemotherapy on December 09, 2014.  Imaging from hospital admission reveals likely a new lesion in his liver as well as a new lesion in his lung with increased size of secondary known pulmonary nodule.  These are highly suspicious for metastatic disease, although possibly could be a second primary.  After lengthy discussion with the patient and his wife, they agreed not to pursue biopsy given the fact that he is bedbound and did not have treatment options if malignancy were confirmed.  Patient's wife adamantly refused hospice care.  No intervention is needed at this time.  No follow-up is necessary in the cancer center. SBO: Unrelated to underlying suspected malignancy.  Improving.   Appreciate consult, call with questions.   Evalene JINNY Reusing, MD   05/24/2024 10:44 AM

## 2024-05-24 NOTE — Plan of Care (Signed)
  Problem: Education: Goal: Knowledge of General Education information will improve Description: Including pain rating scale, medication(s)/side effects and non-pharmacologic comfort measures Outcome: Progressing   Problem: Health Behavior/Discharge Planning: Goal: Ability to manage health-related needs will improve Outcome: Progressing   Problem: Clinical Measurements: Goal: Ability to maintain clinical measurements within normal limits will improve Outcome: Progressing Goal: Will remain free from infection Outcome: Progressing Goal: Diagnostic test results will improve Outcome: Progressing Goal: Respiratory complications will improve Outcome: Progressing Goal: Cardiovascular complication will be avoided Outcome: Progressing   Problem: Activity: Goal: Risk for activity intolerance will decrease Outcome: Progressing   Problem: Nutrition: Goal: Adequate nutrition will be maintained Outcome: Progressing   Problem: Pain Managment: Goal: General experience of comfort will improve and/or be controlled Outcome: Progressing   Problem: Safety: Goal: Ability to remain free from injury will improve Outcome: Progressing   Problem: Elimination: Goal: Will not experience complications related to bowel motility Outcome: Progressing Goal: Will not experience complications related to urinary retention Outcome: Progressing

## 2024-05-24 NOTE — Progress Notes (Signed)
 Patient ID: Stephen [name], male   DOB: 05-May-1946, 78 y.o.   MRN: [id number]     SURGICAL PROGRESS NOTE   Hospital Day(s): 4.   Interval History: Patient seen and examined, no acute events or new complaints overnight.  Patient endorses that he did not have any gas or stool output through the colostomy since yesterday.  He endorses feeling more bloated.  He denies any significant pain.  No pain radiation.  No alleviating or aggravating factors.  Abdominal x-ray this morning shows recurrent high-grade bowel obstruction.  I personally evaluated the images.  No perforation.  Vital signs in last 24 hours: [min-max] current  Temp:  [98.3 F (36.8 C)-98.9 F (37.2 C)] 98.7 F (37.1 C) (07/11 0854) Pulse Rate:  [78-86] 80 (07/11 0854) Resp:  [16-20] 16 (07/11 0854) BP: (114-136)/(75-88) 136/75 (07/11 0854) SpO2:  [92 %-97 %] 97 % (07/11 0854) Weight:  [86.1 kg] 86.1 kg (07/11 0406)     Height: 5' 7 (170.2 cm) Weight: 86.1 kg BMI (Calculated): 29.72   Physical Exam:  Constitutional: alert, cooperative and no distress  Respiratory: breathing non-labored at rest  Cardiovascular: regular rate and sinus rhythm  Gastrointestinal: soft, non-tender, but distended  Labs:     Latest Ref Rng & Units 05/22/2024    4:54 AM 05/21/2024    4:59 AM 05/20/2024   10:58 PM  CBC  WBC 4.0 - 10.5 K/uL 8.1  13.7  13.5   Hemoglobin 13.0 - 17.0 g/dL 85.5  84.4  82.7   Hematocrit 39.0 - 52.0 % 43.1  45.7  49.6   Platelets 150 - 400 K/uL 150  194  227       Latest Ref Rng & Units 05/24/2024    5:01 AM 05/23/2024    2:01 AM 05/22/2024    4:54 AM  CMP  Glucose 70 - 99 mg/dL 97  99  80   BUN 8 - 23 mg/dL 13  18  24    Creatinine 0.61 - 1.24 mg/dL 9.15  8.79  8.87   Sodium 135 - 145 mmol/L 138  140  144   Potassium 3.5 - 5.1 mmol/L 3.0  3.4  3.7   Chloride 98 - 111 mmol/L 102  102  108   CO2 22 - 32 mmol/L 26  26  25    Calcium 8.9 - 10.3 mg/dL 8.4  8.7  8.5    Assessment/Plan:  78 y.o. 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    -Abdominal x-ray this morning shows recurrent high-grade small bowel obstruction - No stool output or gas through the colostomy since yesterday - This is consistent with failure to conservative management.  Patient oriented about the recommendation of exploratory laparotomy - Patient and wife agree to proceed with exploratory laparotomy for treatment of small bowel obstruction  Lucas Petrin, MD

## 2024-05-24 NOTE — OR Nursing (Signed)
 Pt condom catheter discontinued at 1530; indwelling catheter placed following condom catheter removal

## 2024-05-25 ENCOUNTER — Inpatient Hospital Stay

## 2024-05-25 DIAGNOSIS — K56609 Unspecified intestinal obstruction, unspecified as to partial versus complete obstruction: Secondary | ICD-10-CM | POA: Diagnosis not present

## 2024-05-25 LAB — CBC WITH DIFFERENTIAL/PLATELET
Abs Immature Granulocytes: 0.21 K/uL — ABNORMAL HIGH (ref 0.00–0.07)
Basophils Absolute: 0.1 K/uL (ref 0.0–0.1)
Basophils Relative: 0 %
Eosinophils Absolute: 0 K/uL (ref 0.0–0.5)
Eosinophils Relative: 0 %
HCT: 49.7 % (ref 39.0–52.0)
Hemoglobin: 16.3 g/dL (ref 13.0–17.0)
Immature Granulocytes: 1 %
Lymphocytes Relative: 4 %
Lymphs Abs: 0.9 K/uL (ref 0.7–4.0)
MCH: 35.2 pg — ABNORMAL HIGH (ref 26.0–34.0)
MCHC: 32.8 g/dL (ref 30.0–36.0)
MCV: 107.3 fL — ABNORMAL HIGH (ref 80.0–100.0)
Monocytes Absolute: 1.7 K/uL — ABNORMAL HIGH (ref 0.1–1.0)
Monocytes Relative: 7 %
Neutro Abs: 21.6 K/uL — ABNORMAL HIGH (ref 1.7–7.7)
Neutrophils Relative %: 88 %
Platelets: 183 K/uL (ref 150–400)
RBC: 4.63 MIL/uL (ref 4.22–5.81)
RDW: 13.9 % (ref 11.5–15.5)
WBC: 24.6 K/uL — ABNORMAL HIGH (ref 4.0–10.5)
nRBC: 0 % (ref 0.0–0.2)

## 2024-05-25 LAB — CULTURE, BLOOD (ROUTINE X 2)
Culture: NO GROWTH
Culture: NO GROWTH
Special Requests: ADEQUATE
Special Requests: ADEQUATE

## 2024-05-25 LAB — GLUCOSE, CAPILLARY: Glucose-Capillary: 137 mg/dL — ABNORMAL HIGH (ref 70–99)

## 2024-05-25 LAB — VITAMIN D 25 HYDROXY (VIT D DEFICIENCY, FRACTURES): Vit D, 25-Hydroxy: 111.09 ng/mL — ABNORMAL HIGH (ref 30–100)

## 2024-05-25 LAB — BASIC METABOLIC PANEL WITH GFR
Anion gap: 10 (ref 5–15)
BUN: 17 mg/dL (ref 8–23)
CO2: 22 mmol/L (ref 22–32)
Calcium: 7.9 mg/dL — ABNORMAL LOW (ref 8.9–10.3)
Chloride: 106 mmol/L (ref 98–111)
Creatinine, Ser: 1.22 mg/dL (ref 0.61–1.24)
GFR, Estimated: >60 mL/min (ref >60)
Glucose, Bld: 147 mg/dL — ABNORMAL HIGH (ref 70–99)
Potassium: 4.3 mmol/L (ref 3.5–5.1)
Sodium: 138 mmol/L (ref 135–145)

## 2024-05-25 LAB — MAGNESIUM: Magnesium: 1.6 mg/dL — ABNORMAL LOW (ref 1.7–2.4)

## 2024-05-25 LAB — PHOSPHORUS: Phosphorus: 2.8 mg/dL (ref 2.5–4.6)

## 2024-05-25 MED ORDER — LACTATED RINGERS IV SOLN: INTRAVENOUS | Status: DC

## 2024-05-25 MED ORDER — DIATRIZOATE MEGLUMINE & SODIUM 66-10 % PO SOLN
90.0000 mL | Freq: Once | ORAL | Status: AC
  Administered 2024-05-25: 90 mL via NASOGASTRIC

## 2024-05-25 MED ORDER — ENOXAPARIN SODIUM 40 MG/0.4ML IJ SOSY
40.0000 mg | PREFILLED_SYRINGE | INTRAMUSCULAR | Status: DC
  Administered 2024-05-25 – 2024-06-06 (×13): 40 mg via SUBCUTANEOUS
  Filled 2024-05-25 (×13): qty 0.4

## 2024-05-25 NOTE — Progress Notes (Signed)
 Patient ID: Stephen [name], male   DOB: 19-Apr-1946, 78 y.o.   MRN: [id number]     SURGICAL PROGRESS NOTE   Hospital Day(s): 5.   Interval History: Patient seen and examined, no acute events or new complaints overnight. Patient reports feeling OK. He denies severe pain. Endorses feeling tightness in the abdomen. Still not passing gas or stool through colostomy  Vital signs in last 24 hours: [min-max] current  Temp:  [97.5 F (36.4 C)-98.8 F (37.1 C)] 97.8 F (36.6 C) (07/12 0813) Pulse Rate:  [93-122] 102 (07/12 0813) Resp:  [11-22] 18 (07/12 0813) BP: (102-143)/(71-86) 130/84 (07/12 0813) SpO2:  [94 %-99 %] 97 % (07/12 0813) Weight:  [86.1 kg] 86.1 kg (07/12 0500)     Height: 5' 7 (170.2 cm) Weight: 86.1 kg BMI (Calculated): 29.72   Physical Exam:  Constitutional: alert, cooperative and no distress  Respiratory: breathing non-labored at rest  Cardiovascular: regular rate and sinus rhythm  Gastrointestinal: soft, mild-tender, distended. Colostomy pink and patent.   Labs:     Latest Ref Rng & Units 05/25/2024    7:11 AM 05/22/2024    4:54 AM 05/21/2024    4:59 AM  CBC  WBC 4.0 - 10.5 K/uL 24.6  8.1  13.7   Hemoglobin 13.0 - 17.0 g/dL 83.6  85.5  84.4   Hematocrit 39.0 - 52.0 % 49.7  43.1  45.7   Platelets 150 - 400 K/uL 183  150  194       Latest Ref Rng & Units 05/25/2024    7:11 AM 05/24/2024    5:01 AM 05/23/2024    2:01 AM  CMP  Glucose 70 - 99 mg/dL 852  97  99   BUN 8 - 23 mg/dL 17  13  18    Creatinine 0.61 - 1.24 mg/dL 8.77  9.15  8.79   Sodium 135 - 145 mmol/L 138  138  140   Potassium 3.5 - 5.1 mmol/L 4.3  3.0  3.4   Chloride 98 - 111 mmol/L 106  102  102   CO2 22 - 32 mmol/L 22  26  26    Calcium 8.9 - 10.3 mg/dL 7.9  8.4  8.7     Imaging studies: NGT in stomach   Assessment/Plan:  78 y.o. male with small bowel obstruction 1 Day Post-Op s/p lysis of adhesions and small bowel resection, complicated by pertinent comorbidities including HFpEF, alcohol use,  metastatic rectal cancer with Foley and colostomy, HTN, AKI (resolved), and UTI. .  Small bowel obstruction - Status post lysis of adhesion and small bowel resection POD#1 - No return of bowel function yet. Expect post op ileus - Continue IVF's to keep patient hydrated and avoid kidney injury - May remove indwelling foley catheter and use condom foley as before surgery - Will continue to follow with serial exam and imagine until return of bowel function.  - No contraindication for DVT prophylaxis.  - No contraindication for PT/OT for bed exercises and mobilization.   Lucas Petrin, MD

## 2024-05-25 NOTE — Progress Notes (Signed)
 Progress Note   Patient: Stephen [name] FMW:[id number] DOB: 1946/01/17 DOA: 05/20/2024     5 DOS: the patient was seen and examined on 05/25/2024     Brief hospital course:   From admission h and p   Stephen [name] is a 78 y.o. 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  His abdominal pain is mainly in the upper abdomen, constant, mild to moderate, aching, nonradiating, not aggravated or alleviated by any known factors. He has nausea and multiple episodes of vomiting with coffee-ground emesis.  He still has small amount of output from colostomy.  No fever or chills.  Denies chest pain or SOB.  Patient has mild dry cough.  He uses condom catheter and reports burning on urination, no dysuria or hematuria.   Assessment & Plan:  # SBO Repeat abdominal imaging showing persistence of SBO General Surgery took patient for exploratory laparotomy with small bowel resection and appendectomy on 05/24/2024 Continue NG tube management Plan of care discussed with surgery team Continue as needed pain regimen   # History rectal cancer # Colostomy status # New liver and lung masses With isolated met to left lung, s/p resection, adjuvant chemo. Last oncology visit in 2022, lost to f/u, wife says can't transport him to doctor appointments. Here CT of abdomen/pelvis shows left lung base nodule as well as liver nodule. MRI of liver and CT of chest consistent with probable malignancy (metastases vs new primary) Discussed with Dr. Christa and at this point no more intervention planned Oncologist  have discussed with patient as well as patient's wife.  They know this is likely recurrent cancer and since patient has been bedridden there is nothing oncology can offer.  Patient does not want a biopsy.  Wife also not ready for hospice. They have agreed that once his SBO is managed, patient will be discharged home.   # AKI # Lactic acidosis Likely prerenal 2/2 above process. Resolved   # Acute cystitis Ua suggestive of possible infection and does endorse recent dysuria. Culture growing multiple species - continue ceftriaxone  until reliably taking po - will plan on d/c with keflex   # Bedbound Wife says this way last 5 years   # HTN Bp normal, doesn't appear to be on meds at home   # Alcohol use Few drinks a day, denies withdrawal history, no s/s of such here - monitor   # Macrocytosis B12 and folate wnl     DVT prophylaxis: scds Code Status: dnr/dni Family Communication: wife dora updated at bedside 7/10   Level of care: Telemetry Medical Status is: Inpatient Remains inpatient appropriate because: severity of illness       Consultants:  Gen surg   Procedures: NG tube placement Exploratory laparotomy with small bowel resection and appendectomy   Antimicrobials:  none      Subjective: Patient seen and examined at bedside this morning Patient underwent surgery on 05/24/2024 Still has NG tube in place with significant output He is eager to go home whenever he is surgically cleared Still insist that he does not want to undergo any biopsy Denies nausea vomiting worsening abdominal pain  Examination:   General exam: Appears calm and comfortable  Respiratory system: Clear to auscultation. Respiratory effort normal. Cardiovascular system: S1 & S2 heard, RRR.  Gastrointestinal system: Midline surgical incisional dressing is clean and dry Central nervous system: Alert and oriented. No focal neurological deficits. Extremities: warm. Decreased power and muscle  mass in LEs Skin: No rashes, lesions or ulcers Psychiatry: Judgement and insight appear normal. Mood & affect appropriate.    Data Reviewed:    Latest Ref Rng & Units 05/25/2024    7:11 AM 05/22/2024    4:54 AM 05/21/2024    4:59 AM  CBC  WBC 4.0 - 10.5 K/uL 24.6  8.1  13.7   Hemoglobin 13.0 - 17.0 g/dL 83.6  85.5  84.4   Hematocrit 39.0 - 52.0 % 49.7  43.1  45.7   Platelets 150 - 400 K/uL 183  150  194        Latest Ref Rng & Units 05/25/2024    7:11 AM 05/24/2024    5:01 AM 05/23/2024    2:01 AM  BMP  Glucose 70 - 99 mg/dL 852  97  99   BUN 8 - 23 mg/dL 17  13  18    Creatinine 0.61 - 1.24 mg/dL 8.77  9.15  8.79   Sodium 135 - 145 mmol/L 138  138  140   Potassium 3.5 - 5.1 mmol/L 4.3  3.0  3.4   Chloride 98 - 111 mmol/L 106  102  102   CO2 22 - 32 mmol/L 22  26  26    Calcium 8.9 - 10.3 mg/dL 7.9  8.4  8.7     Vitals:   05/25/24 0010 05/25/24 0357 05/25/24 0500 05/25/24 0813  BP: 111/76 123/86  130/84  Pulse: (!) 103 99  (!) 102  Resp: 18 18  18   Temp: 97.6 F (36.4 C) 97.9 F (36.6 C)  97.8 F (36.6 C)  TempSrc: Oral Oral  Oral  SpO2: 99% 99%  97%  Weight:   86.1 kg   Height:         Time spent: 41 minutes  Author: Drue ONEIDA Potter, MD 05/25/2024 1:19 PM  For on call review [email or web address].

## 2024-05-25 NOTE — Plan of Care (Signed)

## 2024-05-26 DIAGNOSIS — K56609 Unspecified intestinal obstruction, unspecified as to partial versus complete obstruction: Secondary | ICD-10-CM | POA: Diagnosis not present

## 2024-05-26 LAB — CBC WITH DIFFERENTIAL/PLATELET
Abs Immature Granulocytes: 0.17 K/uL — ABNORMAL HIGH (ref 0.00–0.07)
Basophils Absolute: 0.1 K/uL (ref 0.0–0.1)
Basophils Relative: 0 %
Eosinophils Absolute: 0 K/uL (ref 0.0–0.5)
Eosinophils Relative: 0 %
HCT: 41 % (ref 39.0–52.0)
Hemoglobin: 13.6 g/dL (ref 13.0–17.0)
Immature Granulocytes: 1 %
Lymphocytes Relative: 8 %
Lymphs Abs: 1.6 K/uL (ref 0.7–4.0)
MCH: 35 pg — ABNORMAL HIGH (ref 26.0–34.0)
MCHC: 33.2 g/dL (ref 30.0–36.0)
MCV: 105.4 fL — ABNORMAL HIGH (ref 80.0–100.0)
Monocytes Absolute: 2 K/uL — ABNORMAL HIGH (ref 0.1–1.0)
Monocytes Relative: 10 %
Neutro Abs: 17.1 K/uL — ABNORMAL HIGH (ref 1.7–7.7)
Neutrophils Relative %: 81 %
Platelets: 171 K/uL (ref 150–400)
RBC: 3.89 MIL/uL — ABNORMAL LOW (ref 4.22–5.81)
RDW: 14.2 % (ref 11.5–15.5)
WBC: 20.9 K/uL — ABNORMAL HIGH (ref 4.0–10.5)
nRBC: 0 % (ref 0.0–0.2)

## 2024-05-26 LAB — BASIC METABOLIC PANEL WITH GFR
Anion gap: 11 (ref 5–15)
BUN: 19 mg/dL (ref 8–23)
CO2: 23 mmol/L (ref 22–32)
Calcium: 8.1 mg/dL — ABNORMAL LOW (ref 8.9–10.3)
Chloride: 104 mmol/L (ref 98–111)
Creatinine, Ser: 1.12 mg/dL (ref 0.61–1.24)
GFR, Estimated: >60 mL/min (ref >60)
Glucose, Bld: 97 mg/dL (ref 70–99)
Potassium: 3.6 mmol/L (ref 3.5–5.1)
Sodium: 138 mmol/L (ref 135–145)

## 2024-05-26 LAB — PHOSPHORUS: Phosphorus: 2.1 mg/dL — ABNORMAL LOW (ref 2.5–4.6)

## 2024-05-26 LAB — MAGNESIUM: Magnesium: 1.5 mg/dL — ABNORMAL LOW (ref 1.7–2.4)

## 2024-05-26 MED ORDER — MAGNESIUM SULFATE 4 GM/100ML IV SOLN
4.0000 g | Freq: Once | INTRAVENOUS | Status: AC
  Administered 2024-05-26: 4 g via INTRAVENOUS
  Filled 2024-05-26 (×2): qty 100

## 2024-05-26 MED ORDER — POTASSIUM PHOSPHATES 15 MMOLE/5ML IV SOLN
30.0000 mmol | Freq: Once | INTRAVENOUS | Status: AC
  Administered 2024-05-26: 30 mmol via INTRAVENOUS
  Filled 2024-05-26 (×2): qty 10

## 2024-05-26 MED ORDER — LACTATED RINGERS IV SOLN: INTRAVENOUS | Status: AC

## 2024-05-26 NOTE — Plan of Care (Signed)

## 2024-05-26 NOTE — Progress Notes (Signed)
 Patient ID: Stephen [name], male   DOB: 1946/07/09, 78 y.o.   MRN: [id number]     SURGICAL PROGRESS NOTE   Hospital Day(s): 6.   Interval History: Patient seen and examined, no acute events or new complaints overnight. Patient reports doing better today. He endorses feeling gas on the bag, but small.  Getting ice hips.  Denies any nausea.  Patient endorses stable mild abdominal pain.  No worsening abdominal pain.  Pain control cramping medication.  Vital signs in last 24 hours: [min-max] current  Temp:  [97.5 F (36.4 C)-98.6 F (37 C)] 98 F (36.7 C) (07/13 0826) Pulse Rate:  [96-108] 96 (07/13 0826) Resp:  [16-20] 18 (07/13 0826) BP: (119-136)/(64-77) 121/64 (07/13 0826) SpO2:  [96 %-97 %] 97 % (07/13 0826) Weight:  [90.1 kg] 90.1 kg (07/13 0437)     Height: 5' 7 (170.2 cm) Weight: 90.1 kg BMI (Calculated): 31.1   Physical Exam:  Constitutional: alert, cooperative and no distress  Respiratory: breathing non-labored at rest  Cardiovascular: regular rate and sinus rhythm  Gastrointestinal: soft, non-tender, and mildly distended.  Colostomy patent.  No gas or stool in the bag.  Labs:     Latest Ref Rng & Units 05/26/2024    6:40 AM 05/25/2024    7:11 AM 05/22/2024    4:54 AM  CBC  WBC 4.0 - 10.5 K/uL 20.9  24.6  8.1   Hemoglobin 13.0 - 17.0 g/dL 86.3  83.6  85.5   Hematocrit 39.0 - 52.0 % 41.0  49.7  43.1   Platelets 150 - 400 K/uL 171  183  150       Latest Ref Rng & Units 05/26/2024    6:40 AM 05/25/2024    7:11 AM 05/24/2024    5:01 AM  CMP  Glucose 70 - 99 mg/dL 97  852  97   BUN 8 - 23 mg/dL 19  17  13    Creatinine 0.61 - 1.24 mg/dL 8.87  8.77  9.15   Sodium 135 - 145 mmol/L 138  138  138   Potassium 3.5 - 5.1 mmol/L 3.6  4.3  3.0   Chloride 98 - 111 mmol/L 104  106  102   CO2 22 - 32 mmol/L 23  22  26    Calcium 8.9 - 10.3 mg/dL 8.1  7.9  8.4     Imaging studies: No new pertinent imaging studies   Assessment/Plan:  78 y.o. male with small bowel obstruction 2 Day  Post-Op s/p lysis of adhesions and small bowel resection, complicated by pertinent comorbidities including HFpEF, alcohol use, metastatic rectal cancer with Foley and colostomy, HTN, AKI (resolved), and UTI. .   Small bowel obstruction - Status post lysis of adhesion and small bowel resection POD#2 - No return of bowel function yet. Expect post op ileus -Abdominal x-ray shows improved small bowel dilation.  Contrast in large intestine.  Hoping to get bowel function return soon.  If he gets bowel function return today will clamp NG and try clear liquid diet. -If no bowel function return for tomorrow, will consider TPN. - Continue IVF's to keep patient hydrated and avoid kidney injury -Replace electrolytes as per pharmacy - Discontinue indwelling Foley catheter.  Continue condom catheter. - Will continue to follow with serial exam. - No contraindication for DVT prophylaxis.  - Patient declines PT/OT for bed exercises and mobilization.   Lucas Petrin, MD

## 2024-05-26 NOTE — Progress Notes (Signed)
 Progress Note   Patient: Stephen [name] FMW:[id number] DOB: 25-Feb-1946 DOA: 05/20/2024     6 DOS: the patient was seen and examined on 05/26/2024   Brief hospital course:   From admission h and p   Stephen [name] is a 78 y.o. 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  His abdominal pain is mainly in the upper abdomen, constant, mild to moderate, aching, nonradiating, not aggravated or alleviated by any known factors. He has nausea and multiple episodes of vomiting with coffee-ground emesis.  He still has small amount of output from colostomy.  No fever or chills.  Denies chest pain or SOB.  Patient has mild dry cough.  He uses condom catheter and reports burning on urination, no dysuria or hematuria.   Assessment & Plan:   # SBO Repeat abdominal imaging showing persistence of SBO General Surgery took patient for exploratory laparotomy with small bowel resection and appendectomy on 05/24/2024 Surgery team on board and managing patient's NG tube closely Plan of care discussed with surgery team Continue as needed pain regimen   # History rectal cancer # Colostomy status # New liver and lung masses With isolated met to left lung, s/p resection, adjuvant chemo. Last oncology visit in 2022, lost to f/u, wife says can't transport him to doctor appointments. Here CT of abdomen/pelvis shows left lung base nodule as well as liver nodule. MRI of liver and CT of chest consistent with probable malignancy (metastases vs new primary) Discussed with Dr. Christa and at this point no more  intervention planned Oncologist have discussed with patient as well as patient's wife.  They know this is likely recurrent cancer and since patient has been bedridden there is nothing oncology can offer.  Patient does not want a biopsy.  Wife also not ready for hospice. They have agreed that once his SBO is managed, patient will be discharged home.   # AKI # Lactic acidosis Likely prerenal 2/2 above process. Resolved   # Acute cystitis Ua suggestive of possible infection and does endorse recent dysuria. Culture growing multiple species - continue ceftriaxone  until reliably taking po - will plan on d/c with keflex   # Bedbound Wife says this way last 5 years   # HTN Bp normal, doesn't appear to be on meds at home   # Alcohol use Few drinks a day, denies withdrawal history, no s/s of such here - monitor   # Macrocytosis B12 and folate wnl     DVT prophylaxis: scds Code Status: dnr/dni Family Communication: wife Stephen [name] updated at bedside 7/10   Level of care: Telemetry Medical Status is: Inpatient Remains inpatient appropriate because: severity of illness       Consultants:  Gen surg   Procedures: NG tube placement Exploratory laparotomy with small bowel resection and appendectomy   Antimicrobials:  none      Subjective: Patient seen and examined at bedside this morning in the presence of the wife Patient underwent surgery on 05/24/2024 NG tube still in place and being managed by the surgery team He denies nausea vomiting worsening abdominal pain     Examination:   General exam: Appears calm  and comfortable  Respiratory system: Clear to auscultation. Respiratory effort normal. Cardiovascular system: S1 & S2 heard, RRR.  Gastrointestinal system: Midline surgical incisional dressing is clean and dry Central nervous system: Alert and oriented. No focal neurological deficits. Extremities: warm. Decreased power and muscle mass in LEs Skin: No rashes, lesions or  ulcers Psychiatry: Judgement and insight appear normal. Mood & affect appropriate.    Data Reviewed:  Vitals:   05/25/24 2025 05/26/24 0337 05/26/24 0437 05/26/24 0826  BP: 136/77 133/70  121/64  Pulse: (!) 101 (!) 108  96  Resp: 20 16  18   Temp: 98.6 F (37 C) 98.1 F (36.7 C)  98 F (36.7 C)  TempSrc: Oral Oral  Oral  SpO2: 96% 97%  97%  Weight:   90.1 kg   Height:          Latest Ref Rng & Units 05/26/2024    6:40 AM 05/25/2024    7:11 AM 05/22/2024    4:54 AM  CBC  WBC 4.0 - 10.5 K/uL 20.9  24.6  8.1   Hemoglobin 13.0 - 17.0 g/dL 86.3  83.6  85.5   Hematocrit 39.0 - 52.0 % 41.0  49.7  43.1   Platelets 150 - 400 K/uL 171  183  150        Latest Ref Rng & Units 05/26/2024    6:40 AM 05/25/2024    7:11 AM 05/24/2024    5:01 AM  BMP  Glucose 70 - 99 mg/dL 97  852  97   BUN 8 - 23 mg/dL 19  17  13    Creatinine 0.61 - 1.24 mg/dL 8.87  8.77  9.15   Sodium 135 - 145 mmol/L 138  138  138   Potassium 3.5 - 5.1 mmol/L 3.6  4.3  3.0   Chloride 98 - 111 mmol/L 104  106  102   CO2 22 - 32 mmol/L 23  22  26    Calcium 8.9 - 10.3 mg/dL 8.1  7.9  8.4      Author: Drue ONEIDA Potter, MD 05/26/2024 5:09 PM  For on call review [email or web address].

## 2024-05-26 NOTE — Consult Note (Signed)
 PHARMACY CONSULT NOTE - ELECTROLYTES  Pharmacy Consult for Electrolyte Monitoring and Replacement   Recent Labs: Height: 5' 7 (170.2 cm) Weight: 90.1 kg (198 lb 10.2 oz) IBW/kg (Calculated) : 66.1 Estimated Creatinine Clearance: 59.1 mL/min (by C-G formula based on SCr of 1.12 mg/dL). Potassium (mmol/L)  Date Value  05/26/2024 3.6  12/09/2014 4.3  10/22/2010 4.4   Magnesium  (mg/dL)  Date Value  92/86/7974 1.5 (L)   Calcium (mg/dL)  Date Value  92/86/7974 8.1 (L)  10/22/2010 9.3   Calcium, Total (mg/dL)  Date Value  98/73/7983 8.2 (L)   Albumin (g/dL)  Date Value  92/92/7974 3.5  12/09/2014 2.8 (L)   Phosphorus (mg/dL)  Date Value  92/86/7974 2.1 (L)   Sodium (mmol/L)  Date Value  05/26/2024 138  12/09/2014 140    Assessment  Stephen [name] is a 78 y.o. male presenting with abdominal distention and pain. 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 Pharmacy has been consulted to monitor and replace electrolytes.  Diet: NPO  Goal of Therapy: Electrolytes WNL  Plan:  Mag 1.5: Mag sulfate 4g IV x 1 K 3.6, Phos 2.1: Kphos 30mmol IV x 1 Check BMP, Mg, Phos with AM labs  Thank you for allowing pharmacy to be a part of this patient's care.  Ryden Wainer A Lenord Fralix, PharmD Clinical Pharmacist 05/26/2024 9:42 AM

## 2024-05-27 ENCOUNTER — Encounter: Payer: Self-pay | Admitting: General Surgery

## 2024-05-27 DIAGNOSIS — K56609 Unspecified intestinal obstruction, unspecified as to partial versus complete obstruction: Secondary | ICD-10-CM | POA: Diagnosis not present

## 2024-05-27 LAB — CBC WITH DIFFERENTIAL/PLATELET
Abs Immature Granulocytes: 0.1 K/uL — ABNORMAL HIGH (ref 0.00–0.07)
Basophils Absolute: 0.1 K/uL (ref 0.0–0.1)
Basophils Relative: 0 %
Eosinophils Absolute: 0.1 K/uL (ref 0.0–0.5)
Eosinophils Relative: 1 %
HCT: 35.3 % — ABNORMAL LOW (ref 39.0–52.0)
Hemoglobin: 12.1 g/dL — ABNORMAL LOW (ref 13.0–17.0)
Immature Granulocytes: 1 %
Lymphocytes Relative: 10 %
Lymphs Abs: 1.5 K/uL (ref 0.7–4.0)
MCH: 35.8 pg — ABNORMAL HIGH (ref 26.0–34.0)
MCHC: 34.3 g/dL (ref 30.0–36.0)
MCV: 104.4 fL — ABNORMAL HIGH (ref 80.0–100.0)
Monocytes Absolute: 1.2 K/uL — ABNORMAL HIGH (ref 0.1–1.0)
Monocytes Relative: 9 %
Neutro Abs: 11.2 K/uL — ABNORMAL HIGH (ref 1.7–7.7)
Neutrophils Relative %: 79 %
Platelets: 156 K/uL (ref 150–400)
RBC: 3.38 MIL/uL — ABNORMAL LOW (ref 4.22–5.81)
RDW: 14.2 % (ref 11.5–15.5)
WBC: 14.2 K/uL — ABNORMAL HIGH (ref 4.0–10.5)
nRBC: 0 % (ref 0.0–0.2)

## 2024-05-27 LAB — MAGNESIUM: Magnesium: 2.4 mg/dL (ref 1.7–2.4)

## 2024-05-27 LAB — BASIC METABOLIC PANEL WITH GFR
Anion gap: 8 (ref 5–15)
BUN: 17 mg/dL (ref 8–23)
CO2: 25 mmol/L (ref 22–32)
Calcium: 7.9 mg/dL — ABNORMAL LOW (ref 8.9–10.3)
Chloride: 106 mmol/L (ref 98–111)
Creatinine, Ser: 1.01 mg/dL (ref 0.61–1.24)
GFR, Estimated: >60 mL/min (ref >60)
Glucose, Bld: 85 mg/dL (ref 70–99)
Potassium: 3.9 mmol/L (ref 3.5–5.1)
Sodium: 139 mmol/L (ref 135–145)

## 2024-05-27 LAB — GLUCOSE, CAPILLARY
Glucose-Capillary: 77 mg/dL (ref 70–99)
Glucose-Capillary: 105 mg/dL — ABNORMAL HIGH (ref 70–99)
Glucose-Capillary: 125 mg/dL — ABNORMAL HIGH (ref 70–99)

## 2024-05-27 LAB — PHOSPHORUS: Phosphorus: 2.2 mg/dL — ABNORMAL LOW (ref 2.5–4.6)

## 2024-05-27 MED ORDER — POTASSIUM & SODIUM PHOSPHATES 280-160-250 MG PO PACK
2.0000 | PACK | Freq: Once | ORAL | Status: AC
  Administered 2024-05-27: 2 via ORAL
  Filled 2024-05-27: qty 2

## 2024-05-27 NOTE — Progress Notes (Signed)
 Progress Note   Patient: Stephen [name] FMW:[id number] DOB: [birth year] DOA: 05/20/2024     7 DOS: the patient was seen and examined on 05/27/2024     Brief hospital course:   From admission h and p   Stephen [name] is a 78 y.o. 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  His abdominal pain is mainly in the upper abdomen, constant, mild to moderate, aching, nonradiating, not aggravated or alleviated by any known factors. He has nausea and multiple episodes of vomiting with coffee-ground emesis.  He still has small amount of output from colostomy.  No fever or chills.  Denies chest pain or SOB.  Patient has mild dry cough.  He uses condom catheter and reports burning on urination, no dysuria or hematuria.   Assessment & Plan:   # SBO Repeat abdominal imaging showing persistence of SBO General Surgery took patient for exploratory laparotomy with small bowel resection and appendectomy on 05/24/2024 Diet has been advanced today by general surgery NG tube in place and clamped Continue as needed pain regimen   # History rectal cancer # Colostomy status # New liver and lung masses With isolated met to left lung, s/p resection, adjuvant chemo. Last oncology visit in 2022, lost to f/u, wife says can't transport him to doctor appointments. Here CT of abdomen/pelvis shows left lung base nodule as well as liver nodule. MRI of liver and CT of chest consistent with probable malignancy (metastases vs new primary) Discussed with Dr. Christa and at this point no more intervention  planned Oncologist have discussed with patient as well as patient's wife.  They know this is likely recurrent cancer and since patient has been bedridden there is nothing oncology can offer.  Patient does not want a biopsy.  Wife also not ready for hospice. They have agreed that once his SBO is managed, patient will be discharged home.   # AKI # Lactic acidosis Likely prerenal 2/2 above process. Resolved   # Acute cystitis Ua suggestive of possible infection and does endorse recent dysuria. Culture growing multiple species Will complete 5 days course on 05/28/2024   # Bedbound Wife says this was last 5 years   # HTN Bp normal, doesn't appear to be on meds at home   # Alcohol use Few drinks a day, denies withdrawal history, no s/s of such here - monitor   # Macrocytosis B12 and folate wnl     DVT prophylaxis: scds Code Status: dnr/dni Family Communication: wife Stephen [name] updated at bedside 7/10   Level of care: Telemetry Medical Status is: Inpatient Remains inpatient appropriate because: severity of illness      Consultants:  Gen surg   Procedures: NG tube placement Exploratory laparotomy with small bowel resection and appendectomy   Antimicrobials:  none      Subjective: Patient seen and examined at bedside this morning in the presence of the wife NG tube still in place and clamped Diet has been advanced to liquid today by general surgery team     Examination:   General exam: Appears calm and comfortable  Respiratory system: Clear to auscultation. Respiratory effort normal. Cardiovascular system: S1 &  S2 heard, RRR.  Gastrointestinal system: Midline surgical incisional dressing is clean and dry Central nervous system: Alert and oriented. No focal neurological deficits. Extremities: warm. Decreased power and muscle mass in LEs Skin: No rashes, lesions or ulcers Psychiatry: Judgement and insight appear normal. Mood & affect appropriate.    Data Reviewed:     Latest Ref Rng & Units 05/27/2024    6:32 AM 05/26/2024    6:40 AM 05/25/2024    7:11 AM  CBC  WBC 4.0 - 10.5 K/uL 14.2  20.9  24.6   Hemoglobin 13.0 - 17.0 g/dL 87.8  86.3  83.6   Hematocrit 39.0 - 52.0 % 35.3  41.0  49.7   Platelets 150 - 400 K/uL 156  171  183        Latest Ref Rng & Units 05/27/2024    6:32 AM 05/26/2024    6:40 AM 05/25/2024    7:11 AM  BMP  Glucose 70 - 99 mg/dL 85  97  852   BUN 8 - 23 mg/dL 17  19  17    Creatinine 0.61 - 1.24 mg/dL 8.98  8.87  8.77   Sodium 135 - 145 mmol/L 139  138  138   Potassium 3.5 - 5.1 mmol/L 3.9  3.6  4.3   Chloride 98 - 111 mmol/L 106  104  106   CO2 22 - 32 mmol/L 25  23  22    Calcium 8.9 - 10.3 mg/dL 7.9  8.1  7.9      Vitals:   05/26/24 2038 05/27/24 0440 05/27/24 0533 05/27/24 0837  BP: (!) 108/51  115/64 117/70  Pulse: 90  87 92  Resp: 15  16 16   Temp: 98.5 F (36.9 C)  98 F (36.7 C) 98.5 F (36.9 C)  TempSrc: Oral  Oral Oral  SpO2: 94%  95% 94%  Weight:  90.1 kg    Height:         Author: Drue ONEIDA Potter, MD 05/27/2024 12:07 PM  For on call review [email or web address].

## 2024-05-27 NOTE — Consult Note (Signed)
 PHARMACY CONSULT NOTE - ELECTROLYTES  Pharmacy Consult for Electrolyte Monitoring and Replacement   Recent Labs: Height: 5' 7 (170.2 cm) Weight: 90.1 kg (198 lb 10.2 oz) IBW/kg (Calculated) : 66.1 Estimated Creatinine Clearance: 65.6 mL/min (by C-G formula based on SCr of 1.01 mg/dL). Potassium (mmol/L)  Date Value  05/27/2024 3.9  12/09/2014 4.3  10/22/2010 4.4   Magnesium  (mg/dL)  Date Value  92/85/7974 2.4   Calcium (mg/dL)  Date Value  92/85/7974 7.9 (L)  10/22/2010 9.3   Calcium, Total (mg/dL)  Date Value  98/73/7983 8.2 (L)   Albumin (g/dL)  Date Value  92/92/7974 3.5  12/09/2014 2.8 (L)   Phosphorus (mg/dL)  Date Value  92/85/7974 2.2 (L)   Sodium (mmol/L)  Date Value  05/27/2024 139  12/09/2014 140    Assessment  Stephen [name] is a 78 y.o. male presenting with abdominal distention and pain. 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 Pharmacy has been consulted to monitor and replace electrolytes.  Diet: Clear liquid diet  Goal of Therapy: Electrolytes WNL  Plan:  K 3.9, Phos 2.2; PhosNaK 2 packs po x 1 dose today Check BMP, Mg, Phos with AM labs   Makailyn Mccormick Rodriguez-Guzman PharmD, BCPS 05/27/2024 7:41 AM

## 2024-05-27 NOTE — Progress Notes (Signed)
 [facility]- General Surgery  SURGICAL PROGRESS NOTE  Hospital Day(s): 7.   Post op day(s): 3 Days Post-Op.   Interval History:  Patient seen and examined. No acute events or new complaints overnight.  Patient reports feeling well. Had stool in colostomy bag this morning. Denies any nausea or vomiting.    Vital signs in last 24 hours: [min-max] current  Temp:  [98 F (36.7 C)-98.5 F (36.9 C)] 98 F (36.7 C) (07/14 0533) Pulse Rate:  [87-90] 87 (07/14 0533) Resp:  [15-17] 16 (07/14 0533) BP: (108-118)/(51-65) 115/64 (07/14 0533) SpO2:  [91 %-95 %] 95 % (07/14 0533) Weight:  [90.1 kg] 90.1 kg (07/14 0440)     Height: 5' 7 (170.2 cm) Weight: 90.1 kg BMI (Calculated): 31.1   Intake/Output last 2 shifts:  07/13 0701 - 07/14 0700 In: 200 [IV Piggyback:200] Out: 650 [Urine:550; Emesis/NG output:100]   Physical Exam:  Constitutional: alert, cooperative and no distress  Respiratory: breathing non-labored at rest  Cardiovascular: regular rate and sinus rhythm  Gastrointestinal: soft, mildly tender, and non-distended, stool in colostomy bag  Labs:     Latest Ref Rng & Units 05/27/2024    6:32 AM 05/26/2024    6:40 AM 05/25/2024    7:11 AM  CBC  WBC 4.0 - 10.5 K/uL 14.2  20.9  24.6   Hemoglobin 13.0 - 17.0 g/dL 87.8  86.3  83.6   Hematocrit 39.0 - 52.0 % 35.3  41.0  49.7   Platelets 150 - 400 K/uL 156  171  183       Latest Ref Rng & Units 05/27/2024    6:32 AM 05/26/2024    6:40 AM 05/25/2024    7:11 AM  CMP  Glucose 70 - 99 mg/dL 85  97  852   BUN 8 - 23 mg/dL 17  19  17    Creatinine 0.61 - 1.24 mg/dL 8.98  8.87  8.77   Sodium 135 - 145 mmol/L 139  138  138   Potassium 3.5 - 5.1 mmol/L 3.9  3.6  4.3   Chloride 98 - 111 mmol/L 106  104  106   CO2 22 - 32 mmol/L 25  23  22    Calcium 8.9 - 10.3 mg/dL 7.9  8.1  7.9     Imaging studies: No new pertinent imaging studies   Assessment/Plan:  78 y.o. male with small bowel obstruction 3 Days Post-Op s/p exploratory  laparotomy with small bowel resection and appendectomy, complicated by pertinent comorbidities including HFpEF, alcohol use, metastatic rectal cancer with Foley and colostomy, HTN, AKI, and UTI.    - No fever, not tachycardiac with a decrease in leukocytosis 20.9 >>14.2  - Clamp NG tube and started clear liquid diet    - Continue pain management and DVT prophylaxis    -- Richa Shor Barrientos PA-C

## 2024-05-27 NOTE — Plan of Care (Signed)

## 2024-05-27 NOTE — Plan of Care (Signed)
  Problem: Health Behavior/Discharge Planning: Goal: Ability to manage health-related needs will improve Outcome: Progressing   Problem: Clinical Measurements: Goal: Ability to maintain clinical measurements within normal limits will improve Outcome: Progressing Goal: Will remain free from infection Outcome: Progressing Goal: Diagnostic test results will improve Outcome: Progressing   Problem: Activity: Goal: Risk for activity intolerance will decrease Outcome: Progressing   Problem: Nutrition: Goal: Adequate nutrition will be maintained Outcome: Progressing

## 2024-05-28 DIAGNOSIS — K56609 Unspecified intestinal obstruction, unspecified as to partial versus complete obstruction: Secondary | ICD-10-CM | POA: Diagnosis not present

## 2024-05-28 LAB — BASIC METABOLIC PANEL WITH GFR
Anion gap: 6 (ref 5–15)
BUN: 16 mg/dL (ref 8–23)
CO2: 27 mmol/L (ref 22–32)
Calcium: 7.8 mg/dL — ABNORMAL LOW (ref 8.9–10.3)
Chloride: 104 mmol/L (ref 98–111)
Creatinine, Ser: 0.89 mg/dL (ref 0.61–1.24)
GFR, Estimated: >60 mL/min (ref >60)
Glucose, Bld: 98 mg/dL (ref 70–99)
Potassium: 3.7 mmol/L (ref 3.5–5.1)
Sodium: 137 mmol/L (ref 135–145)

## 2024-05-28 LAB — GLUCOSE, CAPILLARY: Glucose-Capillary: 77 mg/dL (ref 70–99)

## 2024-05-28 LAB — SURGICAL PATHOLOGY

## 2024-05-28 LAB — PHOSPHORUS: Phosphorus: 2.1 mg/dL — ABNORMAL LOW (ref 2.5–4.6)

## 2024-05-28 LAB — MAGNESIUM: Magnesium: 2.3 mg/dL (ref 1.7–2.4)

## 2024-05-28 MED ORDER — POTASSIUM PHOSPHATES 15 MMOLE/5ML IV SOLN
15.0000 mmol | Freq: Once | INTRAVENOUS | Status: AC
  Administered 2024-05-28: 15 mmol via INTRAVENOUS
  Filled 2024-05-28: qty 5

## 2024-05-28 MED ORDER — ENSURE PLUS HIGH PROTEIN PO LIQD
237.0000 mL | Freq: Three times a day (TID) | ORAL | Status: DC
  Administered 2024-05-28 – 2024-05-29 (×4): 237 mL via ORAL

## 2024-05-28 MED ORDER — ADULT MULTIVITAMIN W/MINERALS CH
1.0000 | ORAL_TABLET | Freq: Every day | ORAL | Status: DC
  Administered 2024-05-29 – 2024-05-30 (×2): 1 via ORAL
  Filled 2024-05-28 (×2): qty 1

## 2024-05-28 NOTE — Progress Notes (Signed)
 Progress Note   Patient: Stephen [name] FMW:[id number] DOB: [birth year] DOA: 05/20/2024     8 DOS: the patient was seen and examined on 05/28/2024     Brief hospital course:   From admission h and p   Antone Summons is a 78 y.o. 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  His abdominal pain is mainly in the upper abdomen, constant, mild to moderate, aching, nonradiating, not aggravated or alleviated by any known factors. He has nausea and multiple episodes of vomiting with coffee-ground emesis.  He still has small amount of output from colostomy.  No fever or chills.  Denies chest pain or SOB.  Patient has mild dry cough.  He uses condom catheter and reports burning on urination, no dysuria or hematuria.   Assessment & Plan:   Small bowel obstruction s/p surgery Repeat abdominal imaging showing persistence of SBO General Surgery took patient for exploratory laparotomy with small bowel resection and appendectomy on 05/24/2024 Diet advanced to full liquid diet by general surgery team today NG tube removed on 05/28/2024 Continue as needed pain regimen   # History rectal cancer # Colostomy status # New liver and lung masses With isolated met to left lung, s/p resection, adjuvant chemo. Last oncology visit in 2022, lost to f/u, wife says can't transport him to doctor appointments. Here CT of abdomen/pelvis shows left lung base nodule as well as liver nodule. MRI of liver and CT of chest consistent with probable malignancy (metastases vs new primary) Discussed with Dr. Christa and  at this point no more intervention planned Oncologist have discussed with patient as well as patient's wife.  They know this is likely recurrent cancer and since patient has been bedridden there is nothing oncology can offer.  Patient does not want a biopsy.  Wife also not ready for hospice. They have agreed that once his SBO is managed, patient will be discharged home.   # AKI # Lactic acidosis Likely prerenal 2/2 above process. Resolved   # Acute cystitis Ua suggestive of possible infection and does endorse recent dysuria. Culture growing multiple species Will complete 5 days course on 05/28/2024   # Bedbound Wife says this was last 5 years   # HTN Bp normal, doesn't appear to be on meds at home   # Alcohol use Few drinks a day, denies withdrawal history, no s/s of such here - monitor   # Macrocytosis B12 and folate wnl     DVT prophylaxis: scds Code Status: dnr/dni Family Communication: wife dora updated at bedside 7/15   Level of care: Telemetry Medical Status is: Inpatient Remains inpatient appropriate because: severity of illness       Consultants:  Gen surg   Procedures: NG tube placement Exploratory laparotomy with small bowel resection and appendectomy   Antimicrobials:  none      Subjective: Patient seen and examined at bedside this morning in the presence of the wife NG tube removed this morning Diet has been advanced by surgical team  Examination:   General exam: Appears calm and comfortable  Respiratory system: Clear to auscultation. Respiratory effort normal. Cardiovascular system: S1 & S2 heard, RRR.  Gastrointestinal system: Midline surgical incisional dressing is clean and dry Central nervous system: Alert and oriented. No focal neurological deficits. Extremities: warm. Decreased power and muscle mass in LEs Skin: No rashes, lesions or ulcers Psychiatry: Judgement and insight appear normal. Mood & affect appropriate.    Data Reviewed:     Latest Ref Rng & Units 05/27/2024    6:32 AM 05/26/2024    6:40 AM 05/25/2024    7:11 AM  CBC  WBC 4.0 - 10.5 K/uL 14.2  20.9  24.6   Hemoglobin 13.0 - 17.0 g/dL 87.8  86.3  83.6   Hematocrit 39.0 - 52.0 % 35.3  41.0  49.7   Platelets 150 - 400 K/uL 156  171  183        Latest Ref Rng & Units 05/28/2024    4:32 AM 05/27/2024    6:32 AM 05/26/2024    6:40 AM  BMP  Glucose 70 - 99 mg/dL 98  85  97   BUN 8 - 23 mg/dL 16  17  19    Creatinine 0.61 - 1.24 mg/dL 9.10  8.98  8.87   Sodium 135 - 145 mmol/L 137  139  138   Potassium 3.5 - 5.1 mmol/L 3.7  3.9  3.6   Chloride 98 - 111 mmol/L 104  106  104   CO2 22 - 32 mmol/L 27  25  23    Calcium 8.9 - 10.3 mg/dL 7.8  7.9  8.1     Vitals:   05/27/24 1502 05/27/24 1957 05/28/24 0204 05/28/24 0800  BP: 116/67 127/69 103/65 116/66  Pulse: 82 95 79 81  Resp: 18 18 17 16   Temp: 98.2 F (36.8 C) 98.7 F (37.1 C) 98.2 F (36.8 C) 98.1 F (36.7 C)  TempSrc: Oral   Oral  SpO2: 92% 92% (!) 86% 93%  Weight:      Height:         Author: Drue ONEIDA Potter, MD 05/28/2024 11:45 AM  For on call review [email or web address].

## 2024-05-28 NOTE — Anesthesia Postprocedure Evaluation (Signed)
 Anesthesia Post Note  Patient: Stephen [name]  Procedure(s) Performed: LAPAROTOMY, EXPLORATORY (Abdomen) EXCISION, SMALL INTESTINE (Abdomen) APPENDECTOMY (Abdomen) LAPAROTOMY, FOR LYSIS OF ADHESIONS (Abdomen)  Patient location during evaluation: PACU Anesthesia Type: General Level of consciousness: awake and alert Pain management: pain level controlled Vital Signs Assessment: post-procedure vital signs reviewed and stable Respiratory status: spontaneous breathing, nonlabored ventilation, respiratory function stable and patient connected to nasal cannula oxygen Cardiovascular status: blood pressure returned to baseline and stable Postop Assessment: no apparent nausea or vomiting Anesthetic complications: no   No notable events documented.   Last Vitals:  Vitals:   05/27/24 1957 05/28/24 0204  BP: 127/69 103/65  Pulse: 95 79  Resp: 18 17  Temp: 37.1 C 36.8 C  SpO2: 92% (!) 86%    Last Pain:  Vitals:   05/27/24 2138  TempSrc:   PainSc: Asleep                 [name]

## 2024-05-28 NOTE — Consult Note (Signed)
 PHARMACY CONSULT NOTE - ELECTROLYTES  Pharmacy Consult for Electrolyte Monitoring and Replacement   Recent Labs: Height: 5' 7 (170.2 cm) Weight: 90.1 kg (198 lb 10.2 oz) IBW/kg (Calculated) : 66.1 Estimated Creatinine Clearance: 74.4 mL/min (by C-G formula based on SCr of 0.89 mg/dL). Potassium (mmol/L)  Date Value  05/28/2024 3.7  12/09/2014 4.3  10/22/2010 4.4   Magnesium  (mg/dL)  Date Value  92/84/7974 2.3   Calcium (mg/dL)  Date Value  92/84/7974 7.8 (L)  10/22/2010 9.3   Calcium, Total (mg/dL)  Date Value  98/73/7983 8.2 (L)   Albumin (g/dL)  Date Value  92/92/7974 3.5  12/09/2014 2.8 (L)   Phosphorus (mg/dL)  Date Value  92/84/7974 2.1 (L)   Sodium (mmol/L)  Date Value  05/28/2024 137  12/09/2014 140    Assessment  Stephen [name] is a 78 y.o. male presenting with abdominal distention and pain. 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 Pharmacy has been consulted to monitor and replace electrolytes.  Diet: Clear liquid diet  Goal of Therapy: Electrolytes WNL  Plan:  K 3.7 and Phos 2.1, Electrolytes continue to trend downward after oral replacement. Will replace with Kphos 15mmol IV x 1 dose today.  Check BMP, Mg, Phos with AM labs  Mohini Heathcock Rodriguez-Guzman PharmD, BCPS 05/28/2024 7:09 AM

## 2024-05-28 NOTE — Progress Notes (Signed)
 I have reviewed and concur with this student's documentation.   Carola Beal, RN 05/28/2024 2:15 PM

## 2024-05-28 NOTE — Plan of Care (Signed)

## 2024-05-28 NOTE — Progress Notes (Addendum)
 [facility]- General Surgery  SURGICAL PROGRESS NOTE  Hospital Day(s): 8.   Post op day(s): 4 Days Post-Op.   Interval History:  Patient seen and examined No acute events or new complaints overnight.  Patient reports feeling well. NG has been clamped for about 24 hrs. Has been tolerating clear liquid diet. Denies any nausea or vomiting. Reports having bowel movements.    Vital signs in last 24 hours: [min-max] current  Temp:  [98.1 F (36.7 C)-98.7 F (37.1 C)] 98.1 F (36.7 C) (07/15 0800) Pulse Rate:  [79-95] 81 (07/15 0800) Resp:  [16-18] 16 (07/15 0800) BP: (103-127)/(65-69) 116/66 (07/15 0800) SpO2:  [86 %-93 %] 93 % (07/15 0800)     Height: 5' 7 (170.2 cm) Weight: 90.1 kg BMI (Calculated): 31.1   Intake/Output last 2 shifts:  07/14 0701 - 07/15 0700 In: 0  Out: 200 [Emesis/NG output:200]   Physical Exam:  Constitutional: alert, cooperative and no distress  Respiratory: breathing non-labored at rest  Cardiovascular: regular rate and sinus rhythm  Gastrointestinal: soft, non-tender, and mildly distended, prevena vacuum in place. Colostomy bag more output this morning.   Labs:     Latest Ref Rng & Units 05/27/2024    6:32 AM 05/26/2024    6:40 AM 05/25/2024    7:11 AM  CBC  WBC 4.0 - 10.5 K/uL 14.2  20.9  24.6   Hemoglobin 13.0 - 17.0 g/dL 87.8  86.3  83.6   Hematocrit 39.0 - 52.0 % 35.3  41.0  49.7   Platelets 150 - 400 K/uL 156  171  183       Latest Ref Rng & Units 05/28/2024    4:32 AM 05/27/2024    6:32 AM 05/26/2024    6:40 AM  CMP  Glucose 70 - 99 mg/dL 98  85  97   BUN 8 - 23 mg/dL 16  17  19    Creatinine 0.61 - 1.24 mg/dL 9.10  8.98  8.87   Sodium 135 - 145 mmol/L 137  139  138   Potassium 3.5 - 5.1 mmol/L 3.7  3.9  3.6   Chloride 98 - 111 mmol/L 104  106  104   CO2 22 - 32 mmol/L 27  25  23    Calcium 8.9 - 10.3 mg/dL 7.8  7.9  8.1     Imaging studies: No new pertinent imaging studies   Assessment/Plan:  78 y.o. male with small bowel  obstruction  4 Days Post-Op s/p exploratory laparotomy with small bowel resection and appendectomy,  complicated by pertinent comorbidities including HFpEF, alcohol use, metastatic rectal cancer with Foley and colostomy, HTN, AKI, and UTI.   - Bowel function seems to be returning. Removed NG tube this morning and advanced to full liquid diet.   - Continue pain management and DVT prophylaxis   - Continue to follow hospitalist recommendations   - Wife at bedside is requesting some assistance with ADLs at home. Will discuss with case manager. Wife stated he had hospice services at home but did not have a good experience.   -- Gilmer Cea PA-C

## 2024-05-29 ENCOUNTER — Other Ambulatory Visit: Payer: Self-pay

## 2024-05-29 ENCOUNTER — Inpatient Hospital Stay: Admitting: Certified Registered"

## 2024-05-29 ENCOUNTER — Encounter
Admission: EM | Disposition: A | Discharge status: Home Health | Payer: Self-pay | Source: Home / Self Care | Attending: Internal Medicine

## 2024-05-29 DIAGNOSIS — K56609 Unspecified intestinal obstruction, unspecified as to partial versus complete obstruction: Secondary | ICD-10-CM | POA: Diagnosis not present

## 2024-05-29 HISTORY — PX: LAPAROTOMY: SHX154

## 2024-05-29 LAB — CBC
HCT: 33.7 % — ABNORMAL LOW (ref 39.0–52.0)
Hemoglobin: 11.3 g/dL — ABNORMAL LOW (ref 13.0–17.0)
MCH: 34.6 pg — ABNORMAL HIGH (ref 26.0–34.0)
MCHC: 33.5 g/dL (ref 30.0–36.0)
MCV: 103.1 fL — ABNORMAL HIGH (ref 80.0–100.0)
Platelets: 163 K/uL (ref 150–400)
RBC: 3.27 MIL/uL — ABNORMAL LOW (ref 4.22–5.81)
RDW: 13.7 % (ref 11.5–15.5)
WBC: 12 K/uL — ABNORMAL HIGH (ref 4.0–10.5)
nRBC: 0.3 % — ABNORMAL HIGH (ref 0.0–0.2)

## 2024-05-29 LAB — BASIC METABOLIC PANEL WITH GFR
Anion gap: 7 (ref 5–15)
BUN: 12 mg/dL (ref 8–23)
CO2: 23 mmol/L (ref 22–32)
Calcium: 7.5 mg/dL — ABNORMAL LOW (ref 8.9–10.3)
Chloride: 104 mmol/L (ref 98–111)
Creatinine, Ser: 0.75 mg/dL (ref 0.61–1.24)
GFR, Estimated: >60 mL/min (ref >60)
Glucose, Bld: 86 mg/dL (ref 70–99)
Potassium: 3.8 mmol/L (ref 3.5–5.1)
Sodium: 134 mmol/L — ABNORMAL LOW (ref 135–145)

## 2024-05-29 LAB — GLUCOSE, CAPILLARY
Glucose-Capillary: 91 mg/dL (ref 70–99)
Glucose-Capillary: 106 mg/dL — ABNORMAL HIGH (ref 70–99)

## 2024-05-29 LAB — PHOSPHORUS: Phosphorus: 2.5 mg/dL (ref 2.5–4.6)

## 2024-05-29 LAB — MAGNESIUM: Magnesium: 2.1 mg/dL (ref 1.7–2.4)

## 2024-05-29 LAB — VITAMIN B6: Vitamin B6: 2.9 ug/L — ABNORMAL LOW (ref 3.4–65.2)

## 2024-05-29 SURGERY — LAPAROTOMY, EXPLORATORY
Anesthesia: General

## 2024-05-29 MED ORDER — ACETAMINOPHEN 10 MG/ML IV SOLN
INTRAVENOUS | Status: AC
  Filled 2024-05-29: qty 100

## 2024-05-29 MED ORDER — CEFAZOLIN SODIUM-DEXTROSE 2-3 GM-%(50ML) IV SOLR
INTRAVENOUS | Status: DC | PRN
  Administered 2024-05-29: 2 g via INTRAVENOUS

## 2024-05-29 MED ORDER — SUCCINYLCHOLINE CHLORIDE 200 MG/10ML IV SOSY
PREFILLED_SYRINGE | INTRAVENOUS | Status: DC | PRN
  Administered 2024-05-29: 120 mg via INTRAVENOUS

## 2024-05-29 MED ORDER — OXYCODONE HCL 5 MG PO TABS: 5.0000 mg | ORAL_TABLET | Freq: Once | ORAL | Status: DC | PRN

## 2024-05-29 MED ORDER — FENTANYL CITRATE (PF) 100 MCG/2ML IJ SOLN
25.0000 ug | INTRAMUSCULAR | Status: DC | PRN
  Administered 2024-05-29 (×3): 25 ug via INTRAVENOUS

## 2024-05-29 MED ORDER — OXYCODONE HCL 5 MG/5ML PO SOLN: 5.0000 mg | Freq: Once | ORAL | Status: DC | PRN

## 2024-05-29 MED ORDER — ACETAMINOPHEN 10 MG/ML IV SOLN
INTRAVENOUS | Status: DC | PRN
  Administered 2024-05-29: 1000 mg via INTRAVENOUS

## 2024-05-29 MED ORDER — DROPERIDOL 2.5 MG/ML IJ SOLN
INTRAMUSCULAR | Status: AC
  Filled 2024-05-29: qty 2

## 2024-05-29 MED ORDER — ONDANSETRON HCL 4 MG/2ML IJ SOLN
INTRAMUSCULAR | Status: DC | PRN
Start: 2024-05-29 — End: 2024-05-29
  Administered 2024-05-29: 4 mg via INTRAVENOUS

## 2024-05-29 MED ORDER — ACETAMINOPHEN 10 MG/ML IV SOLN: 1000.0000 mg | Freq: Once | INTRAVENOUS | Status: DC | PRN

## 2024-05-29 MED ORDER — SODIUM CHLORIDE (PF) 0.9 % IJ SOLN
INTRAMUSCULAR | Status: DC | PRN
  Administered 2024-05-29: 80 mL

## 2024-05-29 MED ORDER — LACTATED RINGERS IV SOLN: INTRAVENOUS | Status: DC | PRN

## 2024-05-29 MED ORDER — PROPOFOL 10 MG/ML IV BOLUS
INTRAVENOUS | Status: AC
Start: 2024-05-29 — End: 2024-05-29
  Filled 2024-05-29: qty 20

## 2024-05-29 MED ORDER — SUGAMMADEX SODIUM 200 MG/2ML IV SOLN
INTRAVENOUS | Status: DC | PRN
  Administered 2024-05-29: 200 mg via INTRAVENOUS

## 2024-05-29 MED ORDER — DROPERIDOL 2.5 MG/ML IJ SOLN
0.6250 mg | Freq: Once | INTRAMUSCULAR | Status: AC | PRN
  Administered 2024-05-29: 0.625 mg via INTRAVENOUS

## 2024-05-29 MED ORDER — PROPOFOL 10 MG/ML IV BOLUS
INTRAVENOUS | Status: DC | PRN
  Administered 2024-05-29: 100 mg via INTRAVENOUS

## 2024-05-29 MED ORDER — LIDOCAINE HCL (CARDIAC) PF 100 MG/5ML IV SOSY
PREFILLED_SYRINGE | INTRAVENOUS | Status: DC | PRN
  Administered 2024-05-29: 60 mg via INTRAVENOUS

## 2024-05-29 MED ORDER — FENTANYL CITRATE (PF) 100 MCG/2ML IJ SOLN
INTRAMUSCULAR | Status: DC | PRN
  Administered 2024-05-29: 25 ug via INTRAVENOUS
  Administered 2024-05-29: 75 ug via INTRAVENOUS

## 2024-05-29 MED ORDER — FENTANYL CITRATE (PF) 100 MCG/2ML IJ SOLN
INTRAMUSCULAR | Status: AC
Start: 2024-05-29 — End: 2024-05-29
  Filled 2024-05-29: qty 2

## 2024-05-29 MED ORDER — DEXAMETHASONE SODIUM PHOSPHATE 10 MG/ML IJ SOLN
INTRAMUSCULAR | Status: DC | PRN
  Administered 2024-05-29: 10 mg via INTRAVENOUS

## 2024-05-29 MED ORDER — FENTANYL CITRATE (PF) 100 MCG/2ML IJ SOLN
INTRAMUSCULAR | Status: AC
  Filled 2024-05-29: qty 2

## 2024-05-29 MED ORDER — ROCURONIUM BROMIDE 100 MG/10ML IV SOLN
INTRAVENOUS | Status: DC | PRN
  Administered 2024-05-29: 50 mg via INTRAVENOUS

## 2024-05-29 SURGICAL SUPPLY — 43 items
CANISTER WOUND CARE 500ML ATS (WOUND CARE) IMPLANT
CHLORAPREP W/TINT 26 (MISCELLANEOUS) IMPLANT
DRAPE LAPAROTOMY 100X77 ABD (DRAPES) ×1 IMPLANT
DRSG OPSITE POSTOP 4X10 (GAUZE/BANDAGES/DRESSINGS) IMPLANT
DRSG OPSITE POSTOP 4X8 (GAUZE/BANDAGES/DRESSINGS) IMPLANT
DRSG VAC GRANUFOAM LG (GAUZE/BANDAGES/DRESSINGS) IMPLANT
ELECT BLADE 6.5 EXT (BLADE) ×1 IMPLANT
ELECTRODE REM PT RTRN 9FT ADLT (ELECTROSURGICAL) ×1 IMPLANT
GLOVE BIO SURGEON STRL SZ 6.5 (GLOVE) ×1 IMPLANT
GLOVE BIOGEL PI IND STRL 6.5 (GLOVE) ×1 IMPLANT
GLOVE SURG SYN 6.5 PF PI (GLOVE) ×2 IMPLANT
GOWN STRL REUS W/ TWL LRG LVL3 (GOWN DISPOSABLE) ×3 IMPLANT
KIT TURNOVER KIT A (KITS) ×1 IMPLANT
LABEL OR SOLS (LABEL) ×1 IMPLANT
LAVAGE JET IRRISEPT WOUND (IRRIGATION / IRRIGATOR) IMPLANT
LIGASURE IMPACT 36 18CM CVD LR (INSTRUMENTS) IMPLANT
MANIFOLD NEPTUNE II (INSTRUMENTS) ×1 IMPLANT
NDL HYPO 22X1.5 SAFETY MO (MISCELLANEOUS) ×1 IMPLANT
NEEDLE HYPO 22X1.5 SAFETY MO (MISCELLANEOUS) ×1 IMPLANT
NS IRRIG 1000ML POUR BTL (IV SOLUTION) ×1 IMPLANT
PACK BASIN MAJOR ARMC (MISCELLANEOUS) ×1 IMPLANT
PACK COLON CLEAN CLOSURE (MISCELLANEOUS) ×1 IMPLANT
RELOAD BL CONTOUR (ENDOMECHANICALS) IMPLANT
RELOAD PROXIMATE 75MM BLUE (ENDOMECHANICALS) IMPLANT
RELOAD STAPLE 40 BLU REG (ENDOMECHANICALS) IMPLANT
RELOAD STAPLE 75 3.8 BLU REG (ENDOMECHANICALS) IMPLANT
RETRACTOR WND ALEXIS-O 25 LRG (MISCELLANEOUS) IMPLANT
SPONGE T-LAP 18X18 ~~LOC~~+RFID (SPONGE) IMPLANT
STAPLER CVD CUT BL 40 RELOAD (ENDOMECHANICALS) IMPLANT
STAPLER CVD CUT BLU 40 RELOAD (ENDOMECHANICALS) IMPLANT
STAPLER GUN LINEAR PROX 60 (STAPLE) IMPLANT
STAPLER PROXIMATE 75MM BLUE (STAPLE) IMPLANT
STAPLER SKIN PROX 35W (STAPLE) ×1 IMPLANT
SUT ETHIBOND 0 MO6 C/R (SUTURE) IMPLANT
SUT PDS AB 0 CT1 27 (SUTURE) IMPLANT
SUT SILK 2-0 18XBRD TIE 12 (SUTURE) IMPLANT
SUT SILK 3 0 SH CR/8 (SUTURE) IMPLANT
SUT STRATAFIX PDS 30 CT-1 (SUTURE) IMPLANT
SUT VIC AB 3-0 SH 27X BRD (SUTURE) ×1 IMPLANT
SYR 20ML LL LF (SYRINGE) ×2 IMPLANT
TRAP FLUID SMOKE EVACUATOR (MISCELLANEOUS) ×1 IMPLANT
TRAY FOLEY MTR SLVR 16FR STAT (SET/KITS/TRAYS/PACK) ×1 IMPLANT
WATER STERILE IRR 500ML POUR (IV SOLUTION) ×1 IMPLANT

## 2024-05-29 NOTE — Transfer of Care (Signed)
 Immediate Anesthesia Transfer of Care Note  Patient: Stephen [name]  Procedure(s) Performed: LAPAROTOMY, EXPLORATORY  Patient Location: PACU  Anesthesia Type:General  Level of Consciousness: awake, alert , and oriented  Airway & Oxygen Therapy: Patient Spontanous Breathing and Patient connected to face mask oxygen  Post-op Assessment: Report given to RN, Post -op Vital signs reviewed and stable, and Patient moving all extremities  Post vital signs: Reviewed and stable  Last Vitals:  Vitals Value Taken Time  BP 102/87 05/29/24 23:10  Temp    Pulse 102 05/29/24 23:13  Resp 19 05/29/24 23:13  SpO2 100 % 05/29/24 23:13  Vitals shown include unfiled device data.  Last Pain:  Vitals:   05/29/24 2101  TempSrc: Oral  PainSc:       Patients Stated Pain Goal: 0 (05/26/24 0734)  Complications: No notable events documented.

## 2024-05-29 NOTE — Op Note (Signed)
 Preoperative diagnosis:  Dehiscence of abdominal wall with evisceration of omental fat  Postoperative diagnosis: Dehiscence of abdominal wall with evisceration of omental fat  Procedure: Re opening of recent laparotomy with closure of abdominal wall    Anesthesia: GETA  Surgeon: Dr. Rodolph, MD  Wound Classification: Clean Contaminated  Indications: Patient is a 78 y.o. male with history of Exploratory Laparotomy for small bowel obstruction 5 days ago the presented today with profuse drainage through the abdominal wall wound.  Upon physical exam there was eviscerated omental fat.  Findings: 1.  No sign of superficial or intra-abdominal cavity infection 2.  Expected intestine was viable 3.  Very weak abdominal wall fascia  Description of procedure: The patient was placed in the supine position and general endotracheal anesthesia was induced. A timeout was completed verifying correct patient, procedure, site, positioning, and implant(s) and/or special equipment prior to beginning this procedure. Preoperative antibiotics were given. A nasogastric tube was placed. The abdomen was prepped and draped in the usual sterile fashion. After a timeout was performed, skin staples and PDS were removed.   Serosanguineous fluid was suctioned from the abdominal cavity.  No purulence or feculent drainage was identified.  Visualized intestine without sign of ischemia or complications.  Small intestine was dilated.  I irrigated the abdominal cavity with 1 L of warm saline.  Clear saline was suctioned. The fascia was closed again with 0 PDS.  For every 3 running stitches of PDS, I place an interrupted 0 Ethibond suture for reinforcement. I irrigated the wound with Irrisept. At negative pressure dressing (DME) was placed in the open wound covering a 20 cm x 2 cm (40 cm area). The patient tolerated the procedure well and was taken to the postanesthesia care unit in stable condition.   Specimen:  None  Complications: None  Estimated Blood Loss: 10 mL

## 2024-05-29 NOTE — Plan of Care (Signed)

## 2024-05-29 NOTE — Progress Notes (Signed)
 Progress Note   Patient: Stephen [name] FMW:[id number] DOB: 1946/06/27 DOA: 05/20/2024     9 DOS: the patient was seen and examined on 05/29/2024      Brief hospital course:   From admission h and p   Stephen [name] is a 78 y.o. 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  His abdominal pain is mainly in the upper abdomen, constant, mild to moderate, aching, nonradiating, not aggravated or alleviated by any known factors. He has nausea and multiple episodes of vomiting with coffee-ground emesis.  He still has small amount of output from colostomy.  No fever or chills.  Denies chest pain or SOB.  Patient has mild dry cough.  He uses condom catheter and reports burning on urination, no dysuria or hematuria.   Assessment & Plan:   Small bowel obstruction s/p surgery Repeat abdominal imaging showing persistence of SBO General Surgery took patient for exploratory laparotomy with small bowel resection and appendectomy on 05/24/2024 Diet advanced to regular diet today by general surgery NG tube removed on 05/28/2024 Continue as needed pain regimen   # History rectal cancer # Colostomy status # New liver and lung masses With isolated met to left lung, s/p resection, adjuvant chemo. Last oncology visit in 2022, lost to f/u, wife says can't transport him to doctor appointments. Here CT of abdomen/pelvis shows left lung base nodule as well as liver nodule. MRI of liver and CT of chest consistent with probable malignancy (metastases vs new primary) Discussed with Dr. Christa and at this  point no more intervention planned Oncologist have discussed with patient as well as patient's wife.  They know this is likely recurrent cancer and since patient has been bedridden there is nothing oncology can offer.  Patient does not want a biopsy.  Wife also not ready for hospice. They have agreed that once his SBO is managed, patient will be discharged home.   # AKI # Lactic acidosis Likely prerenal 2/2 above process. Resolved   # Acute cystitis Ua suggestive of possible infection and does endorse recent dysuria. Culture growing multiple species Will complete 5 days course on 05/28/2024   # Bedbound Wife says this was last 5 years   # HTN Bp normal, doesn't appear to be on meds at home   # Alcohol use Few drinks a day, denies withdrawal history, no s/s of such here - monitor   # Macrocytosis B12 and folate wnl     DVT prophylaxis: scds Code Status: dnr/dni Family Communication: wife dora updated at bedside 7/15   Level of care: Telemetry Medical Status is: Inpatient Remains inpatient appropriate because: severity of illness       Consultants:  Gen surg   Procedures: NG tube placement Exploratory laparotomy with small bowel resection and appendectomy   Antimicrobials:  none      Subjective: Patient seen and examined in the presence of the wife Diet have been advanced by surgery team Denies nausea vomiting abdominal pain chest pain   Examination:   General exam: Appears calm and comfortable  Respiratory system: Clear to auscultation. Respiratory effort normal. Cardiovascular system: S1 & S2 heard, RRR.  Gastrointestinal  system: Midline surgical incisional dressing is clean and dry, colostomy in place Central nervous system: Alert and oriented. No focal neurological deficits. Extremities: warm. Decreased power and muscle mass in LEs Skin: No rashes, lesions or ulcers Psychiatry: Judgement and insight appear normal. Mood & affect appropriate.    Data  Reviewed:  Vitals:   05/29/24 0504 05/29/24 0504 05/29/24 0749 05/29/24 1515  BP: 111/72  114/65 (!) 110/59  Pulse: 78 75 87 95  Resp: 15  16 16   Temp: 97.9 F (36.6 C)  98.7 F (37.1 C) 98.4 F (36.9 C)  TempSrc:   Oral   SpO2: 100% 100% 97% (!) 87%  Weight:      Height:          Latest Ref Rng & Units 05/29/2024    5:14 AM 05/28/2024    4:32 AM 05/27/2024    6:32 AM  BMP  Glucose 70 - 99 mg/dL 86  98  85   BUN 8 - 23 mg/dL 12  16  17    Creatinine 0.61 - 1.24 mg/dL 9.24  9.10  8.98   Sodium 135 - 145 mmol/L 134  137  139   Potassium 3.5 - 5.1 mmol/L 3.8  3.7  3.9   Chloride 98 - 111 mmol/L 104  104  106   CO2 22 - 32 mmol/L 23  27  25    Calcium 8.9 - 10.3 mg/dL 7.5  7.8  7.9      Author: Drue ONEIDA Potter, MD 05/29/2024 5:53 PM  For on call review [email or web address].

## 2024-05-29 NOTE — Consult Note (Signed)
 PHARMACY CONSULT NOTE - ELECTROLYTES  Pharmacy Consult for Electrolyte Monitoring and Replacement   Recent Labs: Height: 5' 7 (170.2 cm) Weight: 93.5 kg (206 lb 2.1 oz) IBW/kg (Calculated) : 66.1 Estimated Creatinine Clearance: 84.3 mL/min (by C-G formula based on SCr of 0.75 mg/dL). Potassium (mmol/L)  Date Value  05/29/2024 3.8  12/09/2014 4.3  10/22/2010 4.4   Magnesium  (mg/dL)  Date Value  92/83/7974 2.1   Calcium (mg/dL)  Date Value  92/83/7974 7.5 (L)  10/22/2010 9.3   Calcium, Total (mg/dL)  Date Value  98/73/7983 8.2 (L)   Albumin (g/dL)  Date Value  92/92/7974 3.5  12/09/2014 2.8 (L)   Phosphorus (mg/dL)  Date Value  92/83/7974 2.5   Sodium (mmol/L)  Date Value  05/29/2024 134 (L)  12/09/2014 140    Assessment  Stephen [name] is a 78 y.o. male presenting with abdominal distention and pain. 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 Pharmacy has been consulted to monitor and replace electrolytes.  Diet: Clear liquid diet  Goal of Therapy: Electrolytes WNL  Plan:  All electrolytes WNL. No replacement needed today. Check renal function panel, Mg, with AM labs  Aamiyah Derrick Rodriguez-Guzman PharmD, BCPS 05/29/2024 8:03 AM

## 2024-05-29 NOTE — Anesthesia Preprocedure Evaluation (Signed)
 Anesthesia Evaluation  Patient identified by MRN, date of birth, ID band Patient awake    Reviewed: Allergy & Precautions, H&P , NPO status , Patient's Chart, lab work & pertinent test results, reviewed documented beta blocker date and time   Airway Mallampati: II  TM Distance: >3 FB Neck ROM: full    Dental  (+) Teeth Intact   Pulmonary pneumonia, resolved, former smoker   Pulmonary exam normal        Cardiovascular Exercise Tolerance: Poor hypertension, On Medications +CHF  Normal cardiovascular exam Rhythm:regular Rate:Normal     Neuro/Psych  PSYCHIATRIC DISORDERS  Depression    negative neurological ROS     GI/Hepatic Neg liver ROS,GERD  Medicated,,  Endo/Other  negative endocrine ROS    Renal/GU CRFRenal disease  negative genitourinary   Musculoskeletal   Abdominal   Peds  Hematology negative hematology ROS (+)   Anesthesia Other Findings Past Medical History: No date: Allergic rhinitis 2015: Blood clot in vein     Comment:  left leg  No date: History of anemia No date: History of chicken pox No date: HTN (hypertension) No date: Neuropathy     Comment:  arms and legs - S/P Cancer tx 09-2013: Rectal cancer metastasized to lung [facility])     Comment:  surgery and chemo No date: Smoker Past Surgical History: 05/24/2024: APPENDECTOMY; N/A     Comment:  Procedure: APPENDECTOMY;  Surgeon: Rodolph Romano, MD;  Location: [facility] ORS;  Service: General;                Laterality: N/A; 05/24/2024: BOWEL RESECTION; N/A     Comment:  Procedure: EXCISION, SMALL INTESTINE;  Surgeon:               Rodolph Romano, MD;  Location: [facility] ORS;  Service:              General;  Laterality: N/A; 2015: COLON RESECTION 2015: COLONOSCOPY 11/10/2022: ENDOSCOPIC RETROGRADE CHOLANGIOPANCREATOGRAPHY (ERCP)  WITH PROPOFOL ; N/A     Comment:  Procedure: ENDOSCOPIC RETROGRADE                CHOLANGIOPANCREATOGRAPHY (ERCP) WITH PROPOFOL ;  Surgeon:               Abran Norleen SAILOR, MD;  Location: [facility] ENDOSCOPY;  Service:               Gastroenterology;  Laterality: N/A; 11/13/2022: ERCP; N/A     Comment:  Procedure: ENDOSCOPIC RETROGRADE               CHOLANGIOPANCREATOGRAPHY (ERCP);  Surgeon: Abran Norleen SAILOR,              MD;  Location: [facility] ENDOSCOPY;  Service: Gastroenterology;               Laterality: N/A; 11/13/2022: ESOPHAGOGASTRODUODENOSCOPY; N/A     Comment:  Procedure: ESOPHAGOGASTRODUODENOSCOPY (EGD);  Surgeon:               Abran Norleen SAILOR, MD;  Location: [facility];  Service:               Gastroenterology;  Laterality: N/A; 11/15/2013: EUS; N/A     Comment:  Procedure: LOWER ENDOSCOPIC ULTRASOUND (EUS);  Surgeon:               Toribio SHAUNNA Cedar, MD;  Location: [facility];  Service:  Endoscopy;  Laterality: N/A; 05/24/2024: LAPAROTOMY; N/A     Comment:  Procedure: LAPAROTOMY, EXPLORATORY;  Surgeon:               Rodolph Romano, MD;  Location: [facility] ORS;  Service:              General;  Laterality: N/A; No date: LUNG BIOPSY No date: LUNG LOBECTOMY; Left 2015: lung removed     Comment:  left lower  05/24/2024: LYSIS OF ADHESION; N/A     Comment:  Procedure: LAPAROTOMY, FOR LYSIS OF ADHESIONS;  Surgeon:              Rodolph Romano, MD;  Location: [facility] ORS;  Service:              General;  Laterality: N/A; 09/20/2017: PLANTAR FASCIA RELEASE; Right     Comment:  Procedure: PLANTAR FASCIA [id number], excision plantar               fibroma right foot;  Surgeon: Ashley Soulier, DPM;                Location: [facility];  Service: Podiatry;                Laterality: Right; 11/13/2022: REMOVAL OF STONES     Comment:  Procedure: REMOVAL OF STONES;  Surgeon: Abran Norleen SAILOR,               MD;  Location: [facility] ENDOSCOPY;  Service: Gastroenterology;; 11/13/2022: ANNETT     Comment:  Procedure: SPHINCTEROTOMY;  Surgeon: Abran Norleen SAILOR, MD;                Location: [facility] ENDOSCOPY;  Service: Gastroenterology;; Childhood: TONSILLECTOMY AND ADENOIDECTOMY 2010: VARICOSE VEIN SURGERY BMI    Body Mass Index: 32.28 kg/m     Reproductive/Obstetrics negative OB ROS                              Anesthesia Physical Anesthesia Plan  ASA: 3 and emergent  Anesthesia Plan: General ETT   Post-op Pain Management:    Induction:   PONV Risk Score and Plan: 3  Airway Management Planned:   Additional Equipment:   Intra-op Plan:   Post-operative Plan:   Informed Consent: I have reviewed the patients History and Physical, chart, labs and discussed the procedure including the risks, benefits and alternatives for the proposed anesthesia with the patient or authorized representative who has indicated his/her understanding and acceptance.     Dental Advisory Given  Plan Discussed with: CRNA  Anesthesia Plan Comments:         Anesthesia Quick Evaluation

## 2024-05-29 NOTE — Progress Notes (Signed)
 Patient ID: Stephen [name], male   DOB: 05/13/1946, 78 y.o.   MRN: [id number]  SURGERY NOTE  The patient tonight, he was complaining of nausea vomiting and abdominal distention.  I proceeded to evaluate with physical exam and remove dressing.  There was abundant serosanguineous fluid on the dressing.  Once I remove the dressing I thought that the abdominal wound was open which was contained by the skin staples.  I discussed with the patient the concern of evisceration.  I discussed with patient and his wife about recommendation of returning to the operating room for closure of the abdominal wound.  I discussed with patient recommendation of placing NGT.  Patient refusing but I explained about the risk recurrent vomiting, aspiration, respiratory failure and even death.  Will take the patient to the OR for revision of abdominal wound.  Lucas Sjogren, MD

## 2024-05-29 NOTE — Anesthesia Procedure Notes (Signed)
 Procedure Name: Intubation Date/Time: 05/29/2024 9:49 PM  Performed by: Landy Francena BIRCH, CRNAPre-anesthesia Checklist: Patient identified, Emergency Drugs available, Suction available and Patient being monitored Patient Re-evaluated:Patient Re-evaluated prior to induction Oxygen Delivery Method: Circle system utilized Preoxygenation: Pre-oxygenation with 100% oxygen Induction Type: IV induction, Rapid sequence and Cricoid Pressure applied Laryngoscope Size: McGrath and 3 Grade View: Grade I Tube type: Oral Tube size: 7.5 mm Number of attempts: 1 Airway Equipment and Method: Stylet, Oral airway and Bite block Placement Confirmation: ETT inserted through vocal cords under direct vision, positive ETCO2 and breath sounds checked- equal and bilateral Secured at: 22 cm Tube secured with: Tape Dental Injury: Teeth and Oropharynx as per pre-operative assessment

## 2024-05-29 NOTE — Plan of Care (Signed)

## 2024-05-29 NOTE — Progress Notes (Signed)
 [facility]- General Surgery  SURGICAL PROGRESS NOTE  Hospital Day(s): 9.   Post op day(s): 5 Days Post-Op.   Interval History:  Patient reports feeling good. Has been tolerating full liquid diet. He denies any nausea or vomiting. Removed NG tube yesterday afternoon. States he is still having bowel movements. Had some stool output in colostomy bag this morning. According to wife in room, his bag was last emptied yesterday.    Vital signs in last 24 hours: [min-max] current  Temp:  [97.9 F (36.6 C)-99 F (37.2 C)] 98.7 F (37.1 C) (07/16 0749) Pulse Rate:  [75-87] 87 (07/16 0749) Resp:  [15-16] 16 (07/16 0749) BP: (111-121)/(65-72) 114/65 (07/16 0749) SpO2:  [92 %-100 %] 97 % (07/16 0749) Weight:  [93.5 kg] 93.5 kg (07/16 0500)     Height: 5' 7 (170.2 cm) Weight: 93.5 kg BMI (Calculated): 32.28   Intake/Output last 2 shifts:  07/15 0701 - 07/16 0700 In: 490.6 [P.O.:360; IV Piggyback:130.6] Out: 775 [Urine:575; Stool:200]   Physical Exam:  Constitutional: alert, cooperative and no distress  Respiratory: breathing non-labored at rest  Cardiovascular: regular rate and sinus rhythm  Gastrointestinal: soft, mildly tender and distended, removed prevena vacuum, staples intact with blister on lower part of incision. Colostomy bag still in place with stool output   Labs:     Latest Ref Rng & Units 05/29/2024    5:14 AM 05/27/2024    6:32 AM 05/26/2024    6:40 AM  CBC  WBC 4.0 - 10.5 K/uL 12.0  14.2  20.9   Hemoglobin 13.0 - 17.0 g/dL 88.6  87.8  86.3   Hematocrit 39.0 - 52.0 % 33.7  35.3  41.0   Platelets 150 - 400 K/uL 163  156  171       Latest Ref Rng & Units 05/29/2024    5:14 AM 05/28/2024    4:32 AM 05/27/2024    6:32 AM  CMP  Glucose 70 - 99 mg/dL 86  98  85   BUN 8 - 23 mg/dL 12  16  17    Creatinine 0.61 - 1.24 mg/dL 9.24  9.10  8.98   Sodium 135 - 145 mmol/L 134  137  139   Potassium 3.5 - 5.1 mmol/L 3.8  3.7  3.9   Chloride 98 - 111 mmol/L 104  104  106   CO2  22 - 32 mmol/L 23  27  25    Calcium 8.9 - 10.3 mg/dL 7.5  7.8  7.9     Imaging studies: No new pertinent imaging studies   Assessment/Plan:  78 y.o. male with small bowel obstruction  5 Days Post-Op s/p exploratory laparotomy with small bowel resection and appendectomy, complicated by pertinent comorbidities including  HFpEF, alcohol use, metastatic rectal cancer with Foley and colostomy, HTN, AKI, and UTI.   - No fever, not tachycardiac and decrease in leukocytosis 20.9 >>14.2 >> 12.0   - Advance to soft diet. Recommend to eat small portions   - Continue pain management and DVT prophylaxis     -- Shakaria Raphael Barrientos PA-C

## 2024-05-29 NOTE — TOC Initial Note (Signed)
 Transition of Care [facility]) - Initial/Assessment Note    Patient Details  Name: Stephen [name] MRN: [id number] Date of Birth: [birth year]  Transition of Care [facility] CM/SW Contact:    Lauraine JAYSON Carpen, LCSW Phone Number: 05/29/2024, 1:14 PM  Clinical Narrative: CSW met with patient. Wife at bedside. CSW introduced role and inquired about home health nursing/aide services. Explained that Medicare will only pay for them to come out 2-3 times per week for 45 minutes-1 hour. Wife said they have had home health in the past but did not find it beneficial. CSW asked if they would be able to pay privately for personal care services and they stated they cannot. CSW encouraged patient and wife to notify CSW if they change their mind about home health or contact his PCP if they change their mind after discharge. Patient is bedbound/total care at baseline and will require ambulance transport home. Address on facesheet is correct. No further concerns. CSW will continue to follow patient and his wife for support and facilitate return home once stable.                 Expected Discharge Plan: Home/Self Care Barriers to Discharge: Continued Medical Work up   Patient Goals and CMS Choice   CMS Medicare.gov Compare Post Acute Care list provided to:: Patient Represenative (must comment) (Wife)        Expected Discharge Plan and Services     Post Acute Care Choice: NA Living arrangements for the past 2 months: Single Family Home                                      Prior Living Arrangements/Services Living arrangements for the past 2 months: Single Family Home Lives with:: Spouse Patient language and need for interpreter reviewed:: Yes Do you feel safe going back to the place where you live?: Yes      Need for Family Participation in Patient Care: Yes (Comment) Care giver support system in place?: Yes (comment) Current home services: DME Criminal Activity/Legal Involvement Pertinent to Current  Situation/Hospitalization: No - Comment as needed  Activities of Daily Living   ADL Screening (condition at time of admission) Independently performs ADLs?: No Does the patient have a NEW difficulty with bathing/dressing/toileting/self-feeding that is expected to last >3 days?: No Does the patient have a NEW difficulty with getting in/out of bed, walking, or climbing stairs that is expected to last >3 days?: No Does the patient have a NEW difficulty with communication that is expected to last >3 days?: No Is the patient deaf or have difficulty hearing?: Yes Does the patient have difficulty seeing, even when wearing glasses/contacts?: Yes Does the patient have difficulty concentrating, remembering, or making decisions?: No  Permission Sought/Granted Permission sought to share information with : Family Supports Permission granted to share information with : Yes, Verbal Permission Granted  Share Information with NAME: Dixie Jafri     Permission granted to share info w Relationship: Wife  Permission granted to share info w Contact Information: [phone]  Emotional Assessment Appearance:: Appears stated age Attitude/Demeanor/Rapport: Engaged, Gracious Affect (typically observed): Appropriate, Calm, Pleasant Orientation: : Oriented to Self, Oriented to Place, Oriented to  Time, Oriented to Situation Alcohol / Substance Use: Not Applicable Psych Involvement: No (comment)  Admission diagnosis:  SBO (small bowel obstruction) (HCC) [K56.609] Patient Active Problem List   Diagnosis Date Noted   SBO (small bowel obstruction) (HCC)  05/20/2024   Liver lesion 05/20/2024   Overweight (BMI 25.0-29.9) 05/20/2024   Coffee ground emesis 05/20/2024   AKI (acute kidney injury) (HCC) 05/20/2024   Esophageal stricture 11/13/2022   Choledocholithiasis 11/10/2022   Choledocholithiasis with acute cholecystitis with obstruction 11/09/2022   Lung nodule, solitary 11/09/2022   Chronic diastolic CHF  (congestive heart failure) (HCC) 11/09/2022   Alcohol abuse 11/09/2022   DNR (do not resuscitate) 11/09/2022   Sepsis (HCC) 11/08/2022   Pressure injury of skin 03/06/2021   Acute on chronic respiratory failure with hypoxia (HCC) 03/05/2021   Malnutrition of moderate degree 02/20/2021   Alcohol withdrawal syndrome (HCC) 02/20/2021   Aspiration into lower respiratory tract, sequela 02/19/2021   UTI (urinary tract infection) 02/19/2021   Acute respiratory failure with hypoxia (HCC) 02/19/2021   Aspiration pneumonia (HCC) 02/19/2021   Partial small bowel obstruction (HCC) 02/19/2021   Severe sepsis (HCC) 02/19/2021   GERD (gastroesophageal reflux disease) 02/19/2021   Depression 02/19/2021   Alcohol use 02/19/2021   Rectal cancer (HCC) 11/15/2013   History of anemia    HTN (hypertension)    Smoker    PCP:  Derick Leita POUR, MD Pharmacy:   [facility] [phone] [address], [place] - [address] [place] [address] Phone: [phone] Fax: [phone]     Social Drivers of Health (SDOH) Social History: SDOH Screenings   Food Insecurity: No Food Insecurity (05/21/2024)  Housing: Low Risk  (05/21/2024)  Transportation Needs: No Transportation Needs (05/21/2024)  Utilities: Not At Risk (05/21/2024)  Social Connections: Moderately Integrated (05/21/2024)  Tobacco Use: Medium Risk (05/24/2024)   SDOH Interventions:     Readmission Risk Interventions     No data to display

## 2024-05-29 NOTE — Progress Notes (Signed)
 PT Cancellation Note  Patient Details Name: Travoris Bushey MRN: [id number] DOB: 1946/05/22   Cancelled Treatment:    Reason Eval/Treat Not Completed: PT screened, no needs identified, will sign off.  Consult received and chart reviewed.  Per patient interview, he is exclusively 'bed-bound' and bed-level for all care needs (toileting, bed baths, etc). Denies OOB to WC/chair for any portion of the day. Endorses this baseline pattern for >1 year.  Has hospital bed and manual WC if needed; set up with physician to visit the home for medical care.  Denies additional equipment or rehab needs, but does endorse needing help for his wife to maintain care needs.  Information and request relayed to [facility]; may benefit from HHRN and HHaide at discharge.  Will complete order at this time; please reconsult should needs change.   Sula Fetterly H. Delores, PT, DPT, NCS 05/29/24, 11:48 AM [phone]

## 2024-05-29 NOTE — Progress Notes (Signed)
 OT Cancellation Note  Patient Details Name: Stephen [name] MRN: [id number] DOB: [birth year]   Cancelled Treatment:    Reason Eval/Treat Not Completed: OT screened, no needs identified, will sign off. Orders received. Per chart review and discussion with care team, pt is bed bound and requires assist for all ADLs at bed level. Pt is declining rehab needs and HH aide at this time. OT to complete orders and sign off, please re-consult if new needs arise.    Alynna Hargrove L. Jamille Yoshino, OTR/L  05/29/24, 1:53 PM

## 2024-05-29 NOTE — Progress Notes (Signed)
 Nutrition Follow Up Note   DOCUMENTATION CODES:   Not applicable  INTERVENTION:   Ensure Plus High Protein po TID, each supplement provides 350 kcal and 20 grams of protein  Magic cup TID with meals, each supplement provides 290 kcal and 9 grams of protein  Thiamine , folic acid  and MVI daily   Pt at high refeed risk; recommend monitor potassium, magnesium  and phosphorus labs daily until stable  Daily weights   B6 level pending   NUTRITION DIAGNOSIS:   Inadequate oral intake related to acute illness as evidenced by NPO status. 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  Met with pt and pt's wife in room today. Pt reports good appetite and oral intake at baseline. Pt reports that he is feeling better today. Pt's main complaint today is soreness around his incision. Pt reports poor appetite and oral intake in hospital; pt reports that he just does not feel like eating. Pt advanced to a regular diet today. Pt reports eating most of an omelet for breakfast this morning. Pt reports that he did not eat any lunch but reports that he did drink an Ensure (prefers chocolate or vanilla). Pt's wife brought in pumpkin soup from home but pt reports that he did not want that. Pt refusing dinner but after extensive review of the menu and some encouragement, pt agreed to a tomato sandwich for dinner. RD discussed with pt the importance of adequate nutrition  needed to preserve lean muscle and support post op healing. RD will add Magic Cups to meal trays (chocolate). Pt remains at refeed risk. Per chart, pt is up ~23lbs since admission. RD unsure of pt's UBW.   Pt s/p resection of his terminal ileum. Pt will be at risk for malabsorption of B12 and bile salts; this was discussed with pt and wife. B12 level checked in hospital and is within normal limits. B6 level pending.    Medications reviewed and include: lovenox , folic acid , MVI, protonix , miralax , thiamine   Labs reviewed: Na 134(L), K 3.8 wnl, P 2.5 wnl, Mg 2.1 wnl B12- 1724(H), folate 12.1 wnl, vitamin D  111.09(H)- 7/9 Wbc- 12.0(H) Cbgs- 106, 91 x 24 hrs   UOP-  NUTRITION - FOCUSED PHYSICAL EXAM:  Flowsheet Row Most Recent Value  Orbital Region Mild depletion  Upper Arm Region No depletion  Thoracic and Lumbar Region No depletion  Buccal Region Mild depletion  Temple Region Mild depletion  Clavicle Bone Region No depletion  Clavicle and Acromion Bone Region No depletion  Scapular Bone Region No depletion  Dorsal Hand No depletion  Patellar Region No depletion  Anterior Thigh Region No depletion  Posterior Calf Region No depletion  Edema (RD Assessment) Mild  Hair Reviewed  Eyes Reviewed  Mouth Reviewed  Skin Reviewed  Nails Reviewed   Diet Order:   Diet Order             Diet regular Room service appropriate? Yes; Fluid consistency: Thin  Diet effective now  EDUCATION NEEDS:   Not appropriate for education at this time  Skin:  Skin Assessment: Reviewed RN Assessment (wound right hand)  Last BM:  7/16- via ostomy  Height:   Ht Readings from Last 1 Encounters:  05/24/24 5' 7 (1.702 m)    Weight:   Wt Readings from Last 1 Encounters:  05/29/24 93.5 kg    Ideal Body Weight:  67.2 kg  BMI:  Body mass index is 32.28 kg/m.  Estimated Nutritional Needs:   Kcal:  1900-2200kcal/day  Protein:  95-110g/day  Fluid:   1.7-2.0L/day  Augustin Shams MS, RD, LDN If unable to be reached, please send secure chat to RD inpatient available from 8:00a-4:00p daily

## 2024-05-30 ENCOUNTER — Other Ambulatory Visit: Payer: Self-pay

## 2024-05-30 ENCOUNTER — Inpatient Hospital Stay

## 2024-05-30 ENCOUNTER — Encounter: Payer: Self-pay | Admitting: General Surgery

## 2024-05-30 DIAGNOSIS — K56609 Unspecified intestinal obstruction, unspecified as to partial versus complete obstruction: Secondary | ICD-10-CM | POA: Diagnosis not present

## 2024-05-30 LAB — GLUCOSE, CAPILLARY
Glucose-Capillary: 128 mg/dL — ABNORMAL HIGH (ref 70–99)
Glucose-Capillary: 135 mg/dL — ABNORMAL HIGH (ref 70–99)
Glucose-Capillary: 146 mg/dL — ABNORMAL HIGH (ref 70–99)
Glucose-Capillary: 154 mg/dL — ABNORMAL HIGH (ref 70–99)
Glucose-Capillary: 165 mg/dL — ABNORMAL HIGH (ref 70–99)

## 2024-05-30 LAB — COMPREHENSIVE METABOLIC PANEL WITH GFR
ALT: 18 U/L (ref 0–44)
AST: 36 U/L (ref 15–41)
Albumin: 2.2 g/dL — ABNORMAL LOW (ref 3.5–5.0)
Alkaline Phosphatase: 118 U/L (ref 38–126)
Anion gap: 9 (ref 5–15)
BUN: 11 mg/dL (ref 8–23)
CO2: 23 mmol/L (ref 22–32)
Calcium: 7.8 mg/dL — ABNORMAL LOW (ref 8.9–10.3)
Chloride: 101 mmol/L (ref 98–111)
Creatinine, Ser: 0.89 mg/dL (ref 0.61–1.24)
GFR, Estimated: >60 mL/min (ref >60)
Glucose, Bld: 162 mg/dL — ABNORMAL HIGH (ref 70–99)
Potassium: 3.9 mmol/L (ref 3.5–5.1)
Sodium: 133 mmol/L — ABNORMAL LOW (ref 135–145)
Total Bilirubin: 0.8 mg/dL (ref 0.0–1.2)
Total Protein: 5.9 g/dL — ABNORMAL LOW (ref 6.5–8.1)

## 2024-05-30 LAB — CBC WITH DIFFERENTIAL/PLATELET
Abs Immature Granulocytes: 0.2 K/uL — ABNORMAL HIGH (ref 0.00–0.07)
Basophils Absolute: 0 K/uL (ref 0.0–0.1)
Basophils Relative: 0 %
Eosinophils Absolute: 0.2 K/uL (ref 0.0–0.5)
Eosinophils Relative: 1 %
HCT: 35 % — ABNORMAL LOW (ref 39.0–52.0)
Hemoglobin: 11.5 g/dL — ABNORMAL LOW (ref 13.0–17.0)
Immature Granulocytes: 1 %
Lymphocytes Relative: 5 %
Lymphs Abs: 0.7 K/uL (ref 0.7–4.0)
MCH: 34.4 pg — ABNORMAL HIGH (ref 26.0–34.0)
MCHC: 32.9 g/dL (ref 30.0–36.0)
MCV: 104.8 fL — ABNORMAL HIGH (ref 80.0–100.0)
Monocytes Absolute: 0.2 K/uL (ref 0.1–1.0)
Monocytes Relative: 1 %
Neutro Abs: 13.9 K/uL — ABNORMAL HIGH (ref 1.7–7.7)
Neutrophils Relative %: 92 %
Platelets: 180 K/uL (ref 150–400)
RBC: 3.34 MIL/uL — ABNORMAL LOW (ref 4.22–5.81)
RDW: 13.6 % (ref 11.5–15.5)
Smear Review: NORMAL
WBC: 15.2 K/uL — ABNORMAL HIGH (ref 4.0–10.5)
nRBC: 0 % (ref 0.0–0.2)

## 2024-05-30 LAB — MAGNESIUM: Magnesium: 1.9 mg/dL (ref 1.7–2.4)

## 2024-05-30 LAB — HEMOGLOBIN A1C
Hgb A1c MFr Bld: 4.8 % (ref 4.8–5.6)
Mean Plasma Glucose: 91.06 mg/dL

## 2024-05-30 LAB — PHOSPHORUS: Phosphorus: 2.7 mg/dL (ref 2.5–4.6)

## 2024-05-30 MED ORDER — SODIUM CHLORIDE 0.9% FLUSH: 10.0000 mL | INTRAVENOUS | Status: DC | PRN

## 2024-05-30 MED ORDER — TRAVASOL 10 % IV SOLN
INTRAVENOUS | Status: AC
  Filled 2024-05-30: qty 499.2

## 2024-05-30 MED ORDER — LACTATED RINGERS IV SOLN: INTRAVENOUS | Status: AC

## 2024-05-30 MED ORDER — ACETAMINOPHEN 325 MG PO TABS
650.0000 mg | ORAL_TABLET | Freq: Four times a day (QID) | ORAL | Status: DC | PRN
  Administered 2024-05-30 – 2024-06-11 (×2): 650 mg via NASOGASTRIC
  Filled 2024-05-30 (×2): qty 2

## 2024-05-30 MED ORDER — INSULIN ASPART 100 UNIT/ML IJ SOLN
0.0000 [IU] | INTRAMUSCULAR | Status: DC
  Administered 2024-05-30 – 2024-05-31 (×2): 2 [IU] via SUBCUTANEOUS
  Administered 2024-05-31 – 2024-06-01 (×4): 1 [IU] via SUBCUTANEOUS
  Administered 2024-06-01: 2 [IU] via SUBCUTANEOUS
  Administered 2024-06-01 (×3): 1 [IU] via SUBCUTANEOUS
  Administered 2024-06-02: 2 [IU] via SUBCUTANEOUS
  Administered 2024-06-02 (×3): 1 [IU] via SUBCUTANEOUS
  Administered 2024-06-02 – 2024-06-05 (×12): 2 [IU] via SUBCUTANEOUS
  Administered 2024-06-05 (×2): 3 [IU] via SUBCUTANEOUS
  Filled 2024-05-30 (×27): qty 1

## 2024-05-30 MED ORDER — SODIUM CHLORIDE 0.9% FLUSH
10.0000 mL | Freq: Two times a day (BID) | INTRAVENOUS | Status: DC
  Administered 2024-05-30 (×2): 10 mL
  Administered 2024-05-31: 20 mL
  Administered 2024-05-31 – 2024-06-12 (×24): 10 mL

## 2024-05-30 NOTE — Consult Note (Addendum)
 PHARMACY - TOTAL PARENTERAL NUTRITION CONSULT NOTE   Indication: Prolonged ileus  Patient Measurements: Height: 5' 7 (170.2 cm) Weight: 97 kg (213 lb 13.5 oz) IBW/kg (Calculated) : 66.1 TPN AdjBW (KG): 71.1 Body mass index is 33.49 kg/m.  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 Admitted with small bowel obstruction. Small bowel resection and appendectomy on 05/24/2024. Noted N/V and abd distention on 7/16. Revision done 7/16 in the evening. Noted prolonged ileus and pharmacy consulted to initiate and manage TPN.  Glucose / Insulin : No hx of diabetes, no recent A1C Electrolytes: Na 133, all other electrolytes WNL Renal: Scr 0.89 (stable) Hepatic: AST/ALT and Tbili - WNL Intake / Output; MIVF: LR @ 50ml/hr GI Imaging: GI Surgeries / Procedures: laparotomy with small bowel resection and appendectomy on 05/24/2024    Central access: 7/17 (ordered - no placed yet) TPN start date: 7/17  Nutritional Goals: Goal TPN rate is 80 mL/hr (provides 99.8 g of protein and 1992.6 kcals per day)  RD Assessment: Estimated Needs Total Energy Estimated Needs: 1900-2200kcal/day Total Protein Estimated Needs: 95-110g/day Total Fluid Estimated Needs: 1.7-2.0L/day  Current Nutrition: NPO  Plan:  Start TPN at 40mL/hr at 1800 Electrolytes in TPN: Na 57mEq/L, K 50mEq/L, Ca 89mEq/L, Mg 52mEq/L, and Phos 15mmol/L. Cl:Ac 1:1 Add standard MVI and trace elements to TPN Initiate Sensitive q4h SSI and adjust as needed  Reduce MIVF to 40 mL/hr at 1800 Monitor TPN labs on Mon/Thurs, daily until stable   Chadd Tollison Rodriguez-Guzman PharmD, BCPS 05/30/2024 8:56 AM

## 2024-05-30 NOTE — Progress Notes (Signed)
 Nutrition Follow Up Note   DOCUMENTATION CODES:   Not applicable  INTERVENTION:   TPN per pharmacy  Continue thiamine , folic acid  and MVI daily in TPN  Pyridoxine 100mg  IV daily x 1 week  Pt at high refeed risk; recommend monitor potassium, magnesium  and phosphorus labs daily until stable  Daily weights   NUTRITION DIAGNOSIS:   Inadequate oral intake related to acute illness as evidenced by NPO status. 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  Pt s/p return to the OR yesterday for wound dehiscence. Pt with abdominal distension today and suspected ileus. Pt NPO. NGT in place with minimal output documented. Pt s/p PICC line and TPN initiation today. Pt remains at high refeed risk. B6 returned low; will provide supplementation. Per chart, pt is up ~30lbs since admission and remains up ~26lbs from his UBW.    Medications reviewed and include: lovenox , folic acid , insulin , protonix , miralax , thiamine , LRS @40ml /hr, TPN  Labs reviewed: Na 133(L), K 3.9 wnl, P 2.7 wnl, Mg 1.9 wnl B12- 1724(H), folate 12.1 wnl, vitamin D  111.09(H), B6 2.9(L)- 7/9 Wbc- 15.2(H) Cbgs- 128, 135 x 24 hrs   UOP-  Drains 50ml   Diet Order:    Diet Order             Diet NPO time specified Except for: Ice Chips, Sips with Meds  Diet  effective now                  EDUCATION NEEDS:   Not appropriate for education at this time  Skin:  Skin Assessment: Reviewed RN Assessment (R hand wound, incision abdomen, VAC)  Last BM:  7/16- via ostomy  Height:   Ht Readings from Last 1 Encounters:  05/24/24 5' 7 (1.702 m)    Weight:   Wt Readings from Last 1 Encounters:  05/30/24 97 kg    Ideal Body Weight:  67.2 kg  BMI:  Body mass index is 33.49 kg/m.  Estimated Nutritional Needs:   Kcal:  1900-2200kcal/day  Protein:  95-110g/day  Fluid:  1.7-2.0L/day  Augustin Shams MS, RD, LDN If unable to be reached, please send secure chat to RD inpatient available from 8:00a-4:00p daily

## 2024-05-30 NOTE — Progress Notes (Signed)
 Progress Note   Patient: Stephen [name] FMW:[id number] DOB: 1946/04/19 DOA: 05/20/2024     10 DOS: the patient was seen and examined on 05/30/2024     Brief hospital course:   From admission h and p   Stephen [name] is a 79 y.o. 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  His abdominal pain is mainly in the upper abdomen, constant, mild to moderate, aching, nonradiating, not aggravated or alleviated by any known factors. He has nausea and multiple episodes of vomiting with coffee-ground emesis.  He still has small amount of output from colostomy.  No fever or chills.  Denies chest pain or SOB.  Patient has mild dry cough.  He uses condom catheter and reports burning on urination, no dysuria or hematuria.   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 Last oncology visit in 2022, lost to f/u, wife says can't transport him to doctor appointments. Here CT of abdomen/pelvis shows left lung base nodule as well as liver nodule. MRI of liver and CT of chest consistent with probable malignancy  (metastases vs new primary) Discussed with Dr. Christa and at this point no more intervention planned Oncologist have discussed with patient as well as patient's wife.  They know this is likely recurrent cancer and since patient has been bedridden there is nothing oncology can offer.  Patient does not want a biopsy.  Wife also not ready for hospice. They have agreed that once his SBO is managed, patient will be discharged home.   # AKI # Lactic acidosis Likely prerenal 2/2 above process. Resolved   # Acute cystitis Ua suggestive of possible infection and does endorse recent dysuria. Culture growing multiple species Will complete 5 days course on 05/28/2024   # Bedbound Wife says this was last 5 years   # HTN Bp normal, doesn't appear to be on meds at home   # Alcohol use Few drinks a day, denies withdrawal history, no s/s of such here - monitor   # Macrocytosis B12 and folate wnl     DVT prophylaxis: scds Code Status: dnr/dni Family Communication: wife dora updated at bedside 7/15   Level of care: Telemetry Medical Status is: Inpatient Remains inpatient appropriate because: severity of illness       Consultants:  Gen surg   Procedures: NG tube placement Exploratory laparotomy with small bowel resection and appendectomy   Antimicrobials:  none      Subjective: Patient experiencing postop ileus NG tube has been inserted again by general surgery Denies worsening abdominal pain chest pain or cough  Examination:   General exam: Appears calm and comfortable  Respiratory system: Clear to auscultation. Respiratory effort normal. Cardiovascular system: S1 & S2  heard, RRR.  Gastrointestinal system: Midline surgical incisional dressing is clean and dry, colostomy in place Central nervous system: Alert and oriented. No focal neurological deficits. Extremities: warm. Decreased power and muscle mass in LEs Skin: No rashes, lesions or ulcers Psychiatry: Judgement and  insight appear normal. Mood & affect appropriate.    Data Reviewed:      Latest Ref Rng & Units 05/30/2024    5:15 AM 05/29/2024    5:14 AM 05/27/2024    6:32 AM  CBC  WBC 4.0 - 10.5 K/uL 15.2  12.0  14.2   Hemoglobin 13.0 - 17.0 g/dL 88.4  88.6  87.8   Hematocrit 39.0 - 52.0 % 35.0  33.7  35.3   Platelets 150 - 400 K/uL 180  163  156        Latest Ref Rng & Units 05/30/2024    5:15 AM 05/29/2024    5:14 AM 05/28/2024    4:32 AM  BMP  Glucose 70 - 99 mg/dL 837  86  98   BUN 8 - 23 mg/dL 11  12  16    Creatinine 0.61 - 1.24 mg/dL 9.10  9.24  9.10   Sodium 135 - 145 mmol/L 133  134  137   Potassium 3.5 - 5.1 mmol/L 3.9  3.8  3.7   Chloride 98 - 111 mmol/L 101  104  104   CO2 22 - 32 mmol/L 23  23  27    Calcium 8.9 - 10.3 mg/dL 7.8  7.5  7.8      Vitals:   05/30/24 0251 05/30/24 0306 05/30/24 0744 05/30/24 1524  BP: 102/68  121/65 108/64  Pulse: 99  88 64  Resp: 16  20 20   Temp: 97.7 F (36.5 C)  97.6 F (36.4 C) 97.6 F (36.4 C)  TempSrc: Axillary  Oral   SpO2: 97%  100% 98%  Weight:  97 kg    Height:         Author: Drue ONEIDA Potter, MD 05/30/2024 5:15 PM  For on call review [email or web address].

## 2024-05-30 NOTE — Progress Notes (Signed)
 Peripherally Inserted Central Catheter Placement  The IV Nurse has discussed with the patient and/or persons authorized to consent for the patient, the purpose of this procedure and the potential benefits and risks involved with this procedure.  The benefits include less needle sticks, lab draws from the catheter, and the patient may be discharged home with the catheter. Risks include, but not limited to, infection, bleeding, blood clot (thrombus formation), and puncture of an artery; nerve damage and irregular heartbeat and possibility to perform a PICC exchange if needed/ordered by physician.  Alternatives to this procedure were also discussed.  Bard Power PICC patient education guide, fact sheet on infection prevention and patient information card has been provided to patient /or left at bedside.    PICC Placement Documentation  PICC Double Lumen 05/30/24 Right Basilic 41 cm 0 cm (Active)  Indication for Insertion or Continuance of Line Administration of hyperosmolar/irritating solutions (i.e. TPN, Vancomycin , etc.) 05/30/24 1120  Exposed Catheter (cm) 0 cm 05/30/24 1120  Site Assessment Clean, Dry, Intact 05/30/24 1120  Lumen #1 Status Flushed;Blood return noted;Saline locked 05/30/24 1120  Lumen #2 Status Flushed;Blood return noted;Saline locked 05/30/24 1120  Dressing Type Transparent 05/30/24 1120  Dressing Status Antimicrobial disc/dressing in place 05/30/24 1120  Line Care Connections checked and tightened 05/30/24 1120  Dressing Intervention New dressing 05/30/24 1120  Dressing Change Due 06/06/24 05/30/24 1120       Kable Haywood Ramos 05/30/2024, 11:31 AM

## 2024-05-30 NOTE — Progress Notes (Signed)
 [facility]- General Surgery  SURGICAL PROGRESS NOTE Hospital Day(s): 10.   Post op day(s):   6 Days Post-op: exploratory laparotomy with small bowel resection and appendectomy  1 Day Post-op: re-opening of recent laparotomy with closure of abdominal wall   Interval History:  Patient presented with an abundant amount of serosanguineous fluid yesterday evening concerning for evisceration. He was taken to the OR last night for closure of the abdominal wall. NGT was placed after surgery. This afternoon patient states feeling well overall. A PICC line was placed to start TPN. He denies any nausea or vomiting.    Vital signs in last 24 hours: [min-max] current  Temp:  [97.6 F (36.4 C)-98.6 F (37 C)] 97.6 F (36.4 C) (07/17 1524) Pulse Rate:  [64-111] 64 (07/17 1524) Resp:  [13-21] 20 (07/17 1524) BP: (102-135)/(64-81) 108/64 (07/17 1524) SpO2:  [92 %-100 %] 98 % (07/17 1524) Weight:  [97 kg] 97 kg (07/17 0306)     Height: 5' 7 (170.2 cm) Weight: 97 kg BMI (Calculated): 33.49   Intake/Output last 2 shifts:  07/16 0701 - 07/17 0700 In: 905 [P.O.:180; I.V.:700; IV Piggyback:25] Out: 840 [Urine:500; Emesis/NG output:20; Blood:20]   Physical Exam:  Constitutional: alert, cooperative and no distress  Respiratory: breathing non-labored at rest  Cardiovascular: regular rate and sinus rhythm  Gastrointestinal: soft, mildly tender, and distended, negative pressure dressing in place with minimal surrounding serosanguinous drainage, very minimal bilious output from NG tube   Labs:     Latest Ref Rng & Units 05/30/2024    5:15 AM 05/29/2024    5:14 AM 05/27/2024    6:32 AM  CBC  WBC 4.0 - 10.5 K/uL 15.2  12.0  14.2   Hemoglobin 13.0 - 17.0 g/dL 88.4  88.6  87.8   Hematocrit 39.0 - 52.0 % 35.0  33.7  35.3   Platelets 150 - 400 K/uL 180  163  156       Latest Ref Rng & Units 05/30/2024    5:15 AM 05/29/2024    5:14 AM 05/28/2024    4:32 AM  CMP  Glucose 70 - 99 mg/dL 837  86  98    BUN 8 - 23 mg/dL 11  12  16    Creatinine 0.61 - 1.24 mg/dL 9.10  9.24  9.10   Sodium 135 - 145 mmol/L 133  134  137   Potassium 3.5 - 5.1 mmol/L 3.9  3.8  3.7   Chloride 98 - 111 mmol/L 101  104  104   CO2 22 - 32 mmol/L 23  23  27    Calcium 8.9 - 10.3 mg/dL 7.8  7.5  7.8   Total Protein 6.5 - 8.1 g/dL 5.9     Total Bilirubin 0.0 - 1.2 mg/dL 0.8     Alkaline Phos 38 - 126 U/L 118     AST 15 - 41 U/L 36     ALT 0 - 44 U/L 18       Imaging studies:  CLINICAL DATA:  Check gastric catheter placement   EXAM: 05/30/24 ABDOMEN - 1 VIEW   COMPARISON:  Film from earlier in the same day.   FINDINGS: Gastric catheter has been advanced and now lies deeper into the stomach in satisfactory position. No free air is noted.   IMPRESSION: Gastric catheter in satisfactory position.     Electronically Signed   By: Oneil Devonshire M.D.   On: 05/30/2024 02:52    Assessment/Plan:  78 y.o. male with  small bowel obstruction 6 Days Post-op s/p exploratory laparotomy with small bowel resection and appendectomy, complicated by pertinent comorbidities including  HFpEF, alcohol use, metastatic rectal cancer with Foley and colostomy, HTN, AKI, and UTI.    - Stable vital signs with no fever, not tachycardiac - Leukocytosis of 15.2, continue to check WBC  - Likely experiencing post-op ileus, will manage conservatively with NGT, NPO and IV fluids  - Start TPN per dietician recommendations   - Continue to monitor abdominal wound   - Continue to monitor for NG output  - Continue pain management and DVT prophylaxis    -- Nylan Nevel Barrientos PA-C

## 2024-05-30 NOTE — Plan of Care (Signed)
   Problem: Coping: Goal: Level of anxiety will decrease Outcome: Progressing   Problem: Pain Managment: Goal: General experience of comfort will improve and/or be controlled Outcome: Progressing   Problem: Safety: Goal: Ability to remain free from injury will improve Outcome: Progressing

## 2024-05-30 NOTE — Plan of Care (Signed)
   Problem: Education: Goal: Knowledge of General Education information will improve Description Including pain rating scale, medication(s)/side effects and non-pharmacologic comfort measures Outcome: Progressing

## 2024-05-30 NOTE — Plan of Care (Signed)
  Problem: Clinical Measurements: Goal: Ability to maintain clinical measurements within normal limits will improve Outcome: Progressing   Problem: Activity: Goal: Risk for activity intolerance will decrease Outcome: Progressing   Problem: Coping: Goal: Level of anxiety will decrease Outcome: Progressing   Problem: Elimination: Goal: Will not experience complications related to bowel motility Outcome: Progressing Goal: Will not experience complications related to urinary retention Outcome: Progressing   Problem: Pain Managment: Goal: General experience of comfort will improve and/or be controlled Outcome: Progressing   Problem: Safety: Goal: Ability to remain free from injury will improve Outcome: Progressing   Problem: Skin Integrity: Goal: Risk for impaired skin integrity will decrease Outcome: Progressing

## 2024-05-31 DIAGNOSIS — K56609 Unspecified intestinal obstruction, unspecified as to partial versus complete obstruction: Secondary | ICD-10-CM | POA: Diagnosis not present

## 2024-05-31 LAB — CBC WITH DIFFERENTIAL/PLATELET
Abs Immature Granulocytes: 0.15 K/uL — ABNORMAL HIGH (ref 0.00–0.07)
Basophils Absolute: 0 K/uL (ref 0.0–0.1)
Basophils Relative: 0 %
Eosinophils Absolute: 0 K/uL (ref 0.0–0.5)
Eosinophils Relative: 0 %
HCT: 29.2 % — ABNORMAL LOW (ref 39.0–52.0)
Hemoglobin: 10 g/dL — ABNORMAL LOW (ref 13.0–17.0)
Immature Granulocytes: 1 %
Lymphocytes Relative: 8 %
Lymphs Abs: 1.2 K/uL (ref 0.7–4.0)
MCH: 35 pg — ABNORMAL HIGH (ref 26.0–34.0)
MCHC: 34.2 g/dL (ref 30.0–36.0)
MCV: 102.1 fL — ABNORMAL HIGH (ref 80.0–100.0)
Monocytes Absolute: 1.2 K/uL — ABNORMAL HIGH (ref 0.1–1.0)
Monocytes Relative: 8 %
Neutro Abs: 13.1 K/uL — ABNORMAL HIGH (ref 1.7–7.7)
Neutrophils Relative %: 83 %
Platelets: 187 K/uL (ref 150–400)
RBC: 2.86 MIL/uL — ABNORMAL LOW (ref 4.22–5.81)
RDW: 13.7 % (ref 11.5–15.5)
WBC: 15.7 K/uL — ABNORMAL HIGH (ref 4.0–10.5)
nRBC: 0 % (ref 0.0–0.2)

## 2024-05-31 LAB — COMPREHENSIVE METABOLIC PANEL WITH GFR
ALT: 12 U/L (ref 0–44)
AST: 29 U/L (ref 15–41)
Albumin: 2 g/dL — ABNORMAL LOW (ref 3.5–5.0)
Alkaline Phosphatase: 91 U/L (ref 38–126)
Anion gap: 8 (ref 5–15)
BUN: 14 mg/dL (ref 8–23)
CO2: 26 mmol/L (ref 22–32)
Calcium: 8.1 mg/dL — ABNORMAL LOW (ref 8.9–10.3)
Chloride: 104 mmol/L (ref 98–111)
Creatinine, Ser: 0.75 mg/dL (ref 0.61–1.24)
GFR, Estimated: >60 mL/min (ref >60)
Glucose, Bld: 125 mg/dL — ABNORMAL HIGH (ref 70–99)
Potassium: 3.9 mmol/L (ref 3.5–5.1)
Sodium: 138 mmol/L (ref 135–145)
Total Bilirubin: 0.5 mg/dL (ref 0.0–1.2)
Total Protein: 5.1 g/dL — ABNORMAL LOW (ref 6.5–8.1)

## 2024-05-31 LAB — GLUCOSE, CAPILLARY
Glucose-Capillary: 132 mg/dL — ABNORMAL HIGH (ref 70–99)
Glucose-Capillary: 131 mg/dL — ABNORMAL HIGH (ref 70–99)
Glucose-Capillary: 115 mg/dL — ABNORMAL HIGH (ref 70–99)
Glucose-Capillary: 116 mg/dL — ABNORMAL HIGH (ref 70–99)
Glucose-Capillary: 146 mg/dL — ABNORMAL HIGH (ref 70–99)

## 2024-05-31 LAB — MAGNESIUM: Magnesium: 2 mg/dL (ref 1.7–2.4)

## 2024-05-31 LAB — PHOSPHORUS: Phosphorus: 1.6 mg/dL — ABNORMAL LOW (ref 2.5–4.6)

## 2024-05-31 MED ORDER — POTASSIUM PHOSPHATES 15 MMOLE/5ML IV SOLN
15.0000 mmol | Freq: Once | INTRAVENOUS | Status: AC
  Administered 2024-05-31: 15 mmol via INTRAVENOUS
  Filled 2024-05-31: qty 5

## 2024-05-31 MED ORDER — TRAVASOL 10 % IV SOLN
INTRAVENOUS | Status: AC
  Filled 2024-05-31: qty 998.4

## 2024-05-31 MED ORDER — PYRIDOXINE HCL 100 MG/ML IJ SOLN
100.0000 mg | Freq: Every day | INTRAMUSCULAR | Status: AC
  Administered 2024-05-31 – 2024-06-06 (×7): 100 mg via INTRAVENOUS
  Filled 2024-05-31 (×7): qty 1

## 2024-05-31 NOTE — Progress Notes (Signed)
 [facility]- General Surgery  SURGICAL PROGRESS NOTE  Hospital Day(s): 11.   Post op day(s):   7 Days Post-op: exploratory laparotomy with small bowel resection and appendectomy  2 Day Post-op: re-opening of recent laparotomy with closure of abdominal wall    Interval History:  Patient seen and examined. Patient reports feeling good. Pain is well controlled. Now experiencing recurrent ileus from recent laparotomy. Has not had a bowel movement or passed gas. Denies any nausea or vomiting.   Vital signs in last 24 hours: [min-max] current  Temp:  [97.6 F (36.4 C)-98.7 F (37.1 C)] 98.7 F (37.1 C) (07/18 0830) Pulse Rate:  [64-83] 83 (07/18 0830) Resp:  [16-20] 16 (07/18 0830) BP: (106-114)/(52-64) 114/59 (07/18 0830) SpO2:  [95 %-99 %] 99 % (07/18 0830)     Height: 5' 7 (170.2 cm) Weight: 97 kg BMI (Calculated): 33.49   Intake/Output last 2 shifts:  07/17 0701 - 07/18 0700 In: 1581.4 [I.V.:1461.4; NG/GT:120] Out: 700 [Urine:650; Drains:50]   Physical Exam:  Constitutional: alert, cooperative and no distress  Respiratory: breathing non-labored at rest  Cardiovascular: regular rate and sinus rhythm  Gastrointestinal: soft, mildly tender, and distended. Blood seeping around the dressing site   Labs:     Latest Ref Rng & Units 05/31/2024    6:06 AM 05/30/2024    5:15 AM 05/29/2024    5:14 AM  CBC  WBC 4.0 - 10.5 K/uL 15.7  15.2  12.0   Hemoglobin 13.0 - 17.0 g/dL 89.9  88.4  88.6   Hematocrit 39.0 - 52.0 % 29.2  35.0  33.7   Platelets 150 - 400 K/uL 187  180  163       Latest Ref Rng & Units 05/31/2024    6:06 AM 05/30/2024    5:15 AM 05/29/2024    5:14 AM  CMP  Glucose 70 - 99 mg/dL 874  837  86   BUN 8 - 23 mg/dL 14  11  12    Creatinine 0.61 - 1.24 mg/dL 9.24  9.10  9.24   Sodium 135 - 145 mmol/L 138  133  134   Potassium 3.5 - 5.1 mmol/L 3.9  3.9  3.8   Chloride 98 - 111 mmol/L 104  101  104   CO2 22 - 32 mmol/L 26  23  23    Calcium 8.9 - 10.3 mg/dL 8.1  7.8   7.5   Total Protein 6.5 - 8.1 g/dL 5.1  5.9    Total Bilirubin 0.0 - 1.2 mg/dL 0.5  0.8    Alkaline Phos 38 - 126 U/L 91  118    AST 15 - 41 U/L 29  36    ALT 0 - 44 U/L 12  18      Imaging studies: No new pertinent imaging studies   Assessment/Plan:  78 y.o. male with small bowel obstruction 7 Days Post-op s/p exploratory laparotomy with small bowel resection and appendectomy, complicated by pertinent comorbidities including HFpEF, alcohol use, metastatic rectal cancer with Foley and colostomy, HTN, AKI, and UTI.  Recurrent ileus from recent laparotomy for closure of abdominal wall s/p day 2   - Stable vital signs    - Slight increase in leukocytosis 15.2 >> 15.7   - NPO until bowel function return    - Plan to start TPN today for nutrition  - Continue to monitor for NGT output   - Plan to change negative pressure dressing this AM from recent laparotomy  - Continue IV  fluids  - Continue DVT prophylaxis and pain management    -- [place] Liz Barrientos PA-C

## 2024-05-31 NOTE — TOC Progression Note (Signed)
 Transition of Care [facility]) - Progression Note    Patient Details  Name: Stephen [name] MRN: [id number] Date of Birth: Aug 31, 1946  [facility] of [facility]) CM/SW Contact  Lauraine JAYSON Carpen, LCSW Phone Number: 05/31/2024, 4:01 PM  Clinical Narrative:   CSW continues to follow progress.  Expected Discharge Plan: Home/Self Care Barriers to Discharge: Continued Medical Work up  Expected Discharge Plan and Services     Post Acute Care Choice: NA Living arrangements for the past 2 months: Single Family Home                                       Social Determinants of Health (SDOH) Interventions SDOH Screenings   Food Insecurity: No Food Insecurity (05/21/2024)  Housing: Low Risk  (05/21/2024)  Transportation Needs: No Transportation Needs (05/21/2024)  Utilities: Not At Risk (05/21/2024)  Social Connections: Moderately Integrated (05/21/2024)  Tobacco Use: Medium Risk (05/24/2024)    Readmission Risk Interventions     No data to display

## 2024-05-31 NOTE — Consult Note (Addendum)
 PHARMACY - TOTAL PARENTERAL NUTRITION CONSULT NOTE   Indication: Prolonged ileus  Patient Measurements: Height: 5' 7 (170.2 cm) Weight: 97 kg (213 lb 13.5 oz) IBW/kg (Calculated) : 66.1 TPN AdjBW (KG): 71.1 Body mass index is 33.49 kg/m.  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 Admitted with small bowel obstruction. Small bowel resection and appendectomy on 05/24/2024. Noted N/V and abd distention on 7/16. Revision done 7/16 in the evening. Noted prolonged ileus and pharmacy consulted to initiate and manage TPN.  Glucose / Insulin : No hx of diabetes,A1C 4.8% 5 units of insulin  given in last 24 hours Electrolytes: HypoPhos -replaced with Kphos outside bag x 1 dose Renal: Scr 0.89 (stable) Hepatic: AST/ALT and Tbili - WNL Intake / Output; MIVF: LR @ 67ml/hr-> 26ml/hr GI Imaging: GI Surgeries / Procedures: laparotomy with small bowel resection and appendectomy on 05/24/2024    Central access: 7/17 TPN start date: 7/17  Nutritional Goals: Goal TPN rate is 80 mL/hr (provides 99.8 g of protein and 1954 kcals per day)  RD Assessment: Estimated Needs Total Energy Estimated Needs: 1900-2200kcal/day Total Protein Estimated Needs: 95-110g/day Total Fluid Estimated Needs: 1.7-2.0L/day  Current Nutrition: NPO  Plan:  Noted phos 1.6; replaced with Kphos 15 mmol IV x 1 dose today  Increase TPN at 80 mL/hr at 1800 Electrolytes in TPN: Na 66mEq/L, K 71mEq/L, Ca 107mEq/L, Mg 66mEq/L, and Phos 15mmol/L. Cl:Ac 1:1 Add standard MVI and trace elements to TPN Add thiamine  and folic acid  to TPN daily until able to take PO Continue Sensitive q4h SSI and adjust as needed  Monitor TPN labs on Mon/Thurs, daily until stable   Roselyn Doby Rodriguez-Guzman PharmD, BCPS 05/31/2024 7:22 AM

## 2024-05-31 NOTE — Progress Notes (Signed)
 Progress Note   Patient: Stephen [name] FMW:[id number] DOB: 05-Dec-1945 DOA: 05/20/2024     11 DOS: the patient was seen and examined on 05/31/2024    Brief hospital course:   From admission h and p   Stephen [name] is a 78 y.o. 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  His abdominal pain is mainly in the upper abdomen, constant, mild to moderate, aching, nonradiating, not aggravated or alleviated by any known factors. He has nausea and multiple episodes of vomiting with coffee-ground emesis.  He still has small amount of output from colostomy.  No fever or chills.  Denies chest pain or SOB.  Patient has mild dry cough.  He uses condom catheter and reports burning on urination, no dysuria or hematuria.   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 Last oncology visit in 2022, lost to f/u, wife says can't transport him to doctor appointments. Here CT of abdomen/pelvis shows left lung base nodule as well as liver nodule. MRI of liver and CT of chest consistent with probable  malignancy (metastases vs new primary) Discussed with Dr. Christa and at this point no more intervention planned Oncologist have discussed with patient as well as patient's wife.  They know this is likely recurrent cancer and since patient has been bedridden there is nothing oncology can offer.  Patient does not want a biopsy.  Wife also not ready for hospice. They have agreed that once his SBO is managed, patient will be discharged home.   # AKI # Lactic acidosis Likely prerenal 2/2 above process. Resolved   # Acute cystitis Ua suggestive of possible infection and does endorse recent dysuria. Culture growing multiple species Will complete 5 days course on 05/28/2024   # Bedbound Wife says this was last 5 years   # HTN Bp normal, doesn't appear to be on meds at home   # Alcohol use Few drinks a day, denies withdrawal history, no s/s of such here - monitor   # Macrocytosis B12 and folate wnl     DVT prophylaxis: scds Code Status: dnr/dni Family Communication: wife dora updated at bedside 7/15   Level of care: Telemetry Medical Status is: Inpatient Remains inpatient appropriate because: severity of illness       Consultants:  Gen surg   Procedures: NG tube placement Exploratory laparotomy with small bowel resection and appendectomy   Antimicrobials:  none      Subjective: Patient experiencing postop ileus NG tube in place with no much drainage He denies worsening abdominal pain chest pain or cough   Examination:   General exam: Appears calm and comfortable  Respiratory system: Clear to auscultation. Respiratory effort normal. Cardiovascular system: S1 &  S2 heard, RRR.  Gastrointestinal system: Midline surgical incisional dressing is clean and dry, colostomy in place Central nervous system: Alert and oriented. No focal neurological deficits. Extremities: warm. Decreased power and muscle mass in LEs Skin: No rashes, lesions or ulcers Psychiatry: Judgement  and insight appear normal. Mood & affect appropriate.    Data Reviewed:    Latest Ref Rng & Units 05/31/2024    6:06 AM 05/30/2024    5:15 AM 05/29/2024    5:14 AM  BMP  Glucose 70 - 99 mg/dL 874  837  86   BUN 8 - 23 mg/dL 14  11  12    Creatinine 0.61 - 1.24 mg/dL 9.24  9.10  9.24   Sodium 135 - 145 mmol/L 138  133  134   Potassium 3.5 - 5.1 mmol/L 3.9  3.9  3.8   Chloride 98 - 111 mmol/L 104  101  104   CO2 22 - 32 mmol/L 26  23  23    Calcium 8.9 - 10.3 mg/dL 8.1  7.8  7.5     Vitals:   05/30/24 2043 05/31/24 0337 05/31/24 0830 05/31/24 1522  BP: (!) 113/52 106/61 (!) 114/59 108/63  Pulse: 77 75 83 83  Resp: 16 16 16 14   Temp: 97.7 F (36.5 C) 97.7 F (36.5 C) 98.7 F (37.1 C) 98.1 F (36.7 C)  TempSrc:   Oral Oral  SpO2: 98% 95% 99% 97%  Weight:      Height:         Author: Drue ONEIDA Potter, MD 05/31/2024 5:59 PM  For on call review [email or web address].

## 2024-06-01 ENCOUNTER — Inpatient Hospital Stay

## 2024-06-01 DIAGNOSIS — K56609 Unspecified intestinal obstruction, unspecified as to partial versus complete obstruction: Secondary | ICD-10-CM | POA: Diagnosis not present

## 2024-06-01 LAB — BASIC METABOLIC PANEL WITH GFR
Anion gap: 4 — ABNORMAL LOW (ref 5–15)
BUN: 15 mg/dL (ref 8–23)
CO2: 27 mmol/L (ref 22–32)
Calcium: 8 mg/dL — ABNORMAL LOW (ref 8.9–10.3)
Chloride: 106 mmol/L (ref 98–111)
Creatinine, Ser: 0.75 mg/dL (ref 0.61–1.24)
GFR, Estimated: >60 mL/min (ref >60)
Glucose, Bld: 130 mg/dL — ABNORMAL HIGH (ref 70–99)
Potassium: 4.2 mmol/L (ref 3.5–5.1)
Sodium: 137 mmol/L (ref 135–145)

## 2024-06-01 LAB — CBC WITH DIFFERENTIAL/PLATELET
Abs Immature Granulocytes: 0.33 K/uL — ABNORMAL HIGH (ref 0.00–0.07)
Basophils Absolute: 0.1 K/uL (ref 0.0–0.1)
Basophils Relative: 1 %
Eosinophils Absolute: 0.1 K/uL (ref 0.0–0.5)
Eosinophils Relative: 1 %
HCT: 28.9 % — ABNORMAL LOW (ref 39.0–52.0)
Hemoglobin: 9.9 g/dL — ABNORMAL LOW (ref 13.0–17.0)
Immature Granulocytes: 3 %
Lymphocytes Relative: 13 %
Lymphs Abs: 1.7 K/uL (ref 0.7–4.0)
MCH: 35.1 pg — ABNORMAL HIGH (ref 26.0–34.0)
MCHC: 34.3 g/dL (ref 30.0–36.0)
MCV: 102.5 fL — ABNORMAL HIGH (ref 80.0–100.0)
Monocytes Absolute: 1.3 K/uL — ABNORMAL HIGH (ref 0.1–1.0)
Monocytes Relative: 10 %
Neutro Abs: 9.7 K/uL — ABNORMAL HIGH (ref 1.7–7.7)
Neutrophils Relative %: 72 %
Platelets: 174 K/uL (ref 150–400)
RBC: 2.82 MIL/uL — ABNORMAL LOW (ref 4.22–5.81)
RDW: 14 % (ref 11.5–15.5)
WBC: 13.2 K/uL — ABNORMAL HIGH (ref 4.0–10.5)
nRBC: 0 % (ref 0.0–0.2)

## 2024-06-01 LAB — GLUCOSE, CAPILLARY
Glucose-Capillary: 127 mg/dL — ABNORMAL HIGH (ref 70–99)
Glucose-Capillary: 132 mg/dL — ABNORMAL HIGH (ref 70–99)
Glucose-Capillary: 136 mg/dL — ABNORMAL HIGH (ref 70–99)
Glucose-Capillary: 138 mg/dL — ABNORMAL HIGH (ref 70–99)
Glucose-Capillary: 151 mg/dL — ABNORMAL HIGH (ref 70–99)
Glucose-Capillary: 156 mg/dL — ABNORMAL HIGH (ref 70–99)

## 2024-06-01 LAB — MAGNESIUM: Magnesium: 2.1 mg/dL (ref 1.7–2.4)

## 2024-06-01 LAB — PHOSPHORUS: Phosphorus: 2.5 mg/dL (ref 2.5–4.6)

## 2024-06-01 MED ORDER — TRAVASOL 10 % IV SOLN
INTRAVENOUS | Status: AC
  Filled 2024-06-01: qty 998.4

## 2024-06-01 MED ORDER — POLYETHYLENE GLYCOL 3350 17 G PO PACK
17.0000 g | PACK | Freq: Every day | ORAL | Status: DC
  Administered 2024-06-02 – 2024-06-12 (×5): 17 g via ORAL
  Filled 2024-06-01 (×8): qty 1

## 2024-06-01 MED ORDER — DIATRIZOATE MEGLUMINE & SODIUM 66-10 % PO SOLN
90.0000 mL | Freq: Once | ORAL | Status: AC
  Administered 2024-06-01: 90 mL via NASOGASTRIC

## 2024-06-01 NOTE — Progress Notes (Signed)
 Progress Note   Patient: Stephen [name] FMW:[id number] DOB: [birth year] DOA: 05/20/2024     12 DOS: the patient was seen and examined on 06/01/2024    Brief hospital course:   From admission h and p   Stephen [name] is a 78 y.o. 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  His abdominal pain is mainly in the upper abdomen, constant, mild to moderate, aching, nonradiating, not aggravated or alleviated by any known factors. He has nausea and multiple episodes of vomiting with coffee-ground emesis.  He still has small amount of output from colostomy.  No fever or chills.  Denies chest pain or SOB.  Patient has mild dry cough.  He uses condom catheter and reports burning on urination, no dysuria or hematuria.   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 Last oncology visit in 2022, lost to f/u, wife says can't transport him to doctor appointments. Here CT of abdomen/pelvis shows left lung base nodule as well as liver nodule. MRI of liver and CT of chest consistent with probable  malignancy (metastases vs new primary) Discussed with Dr. Christa and at this point no more intervention planned Oncologist have discussed with patient as well as patient's wife.  They know this is likely recurrent cancer and since patient has been bedridden there is nothing oncology can offer.  Patient does not want a biopsy.  Wife also not ready for hospice. They have agreed that once his SBO is managed, patient will be discharged home.   # AKI # Lactic acidosis Likely prerenal 2/2 above process. Resolved   # Acute cystitis Ua suggestive of possible infection and does endorse recent dysuria. Culture growing multiple species Will complete 5 days course on 05/28/2024   # Bedbound Wife says this was last 5 years   # HTN Bp normal, doesn't appear to be on meds at home   # Alcohol use Few drinks a day, denies withdrawal history, no s/s of such here - monitor   # Macrocytosis B12 and folate wnl     DVT prophylaxis: scds Code Status: dnr/dni Family Communication: wife Stephen [name] updated at bedside 7/15   Level of care: Telemetry Medical Status is: Inpatient Remains inpatient appropriate because: severity of illness       Consultants:  Gen surg   Procedures: NG tube placement Exploratory laparotomy with small bowel resection and appendectomy   Antimicrobials:  none      Subjective: Patient seen and examined at bedside this morning Still has NG tube in place Denies nausea vomiting worsening abdominal pain   Examination:   General exam: Appears calm and comfortable  Respiratory system: Clear to auscultation. Respiratory effort normal. Cardiovascular system: S1 &  S2 heard, RRR.  Gastrointestinal system: Midline surgical incisional dressing is clean and dry, colostomy in place Central nervous system: Alert and oriented. No focal neurological deficits. Extremities: warm. Decreased power and muscle mass in LEs Skin: No rashes, lesions or ulcers Psychiatry: Judgement and  insight appear normal. Mood & affect appropriate.    Data Reviewed:  Vitals:   05/31/24 1522 05/31/24 2050 06/01/24 0419 06/01/24 0816  BP: 108/63 120/71 (!) 99/56 112/67  Pulse: 83 89 86 94  Resp: 14 16 16 20   Temp: 98.1 F (36.7 C) 98 F (36.7 C) 97.7 F (36.5 C)   TempSrc: Oral Oral    SpO2: 97% 96% 96% 95%  Weight:      Height:          Latest Ref Rng & Units 06/01/2024    5:44 AM 05/31/2024    6:06 AM 05/30/2024    5:15 AM  CBC  WBC 4.0 - 10.5 K/uL 13.2  15.7  15.2   Hemoglobin 13.0 - 17.0 g/dL 9.9  89.9  88.4   Hematocrit 39.0 - 52.0 % 28.9  29.2  35.0   Platelets 150 - 400 K/uL 174  187  180        Latest Ref Rng & Units 06/01/2024    5:44 AM 05/31/2024    6:06 AM 05/30/2024    5:15 AM  BMP  Glucose 70 - 99 mg/dL 869  874  837   BUN 8 - 23 mg/dL 15  14  11    Creatinine 0.61 - 1.24 mg/dL 9.24  9.24  9.10   Sodium 135 - 145 mmol/L 137  138  133   Potassium 3.5 - 5.1 mmol/L 4.2  3.9  3.9   Chloride 98 - 111 mmol/L 106  104  101   CO2 22 - 32 mmol/L 27  26  23    Calcium 8.9 - 10.3 mg/dL 8.0  8.1  7.8      Author: Drue ONEIDA Potter, MD 06/01/2024 1:55 PM  For on call review [email or web address].

## 2024-06-01 NOTE — Progress Notes (Signed)
 Patient ID: Stephen [name], male   DOB: Dec 07, 1945, 78 y.o.   MRN: [id number]     SURGICAL PROGRESS NOTE   Hospital Day(s): 12.   Interval History: Patient seen and examined, no acute events or new complaints overnight. Patient reports feeling hungry.  He endorses soreness of the abdomen but no severe pain.  Still not passing any gas.  Vital signs in last 24 hours: [min-max] current  Temp:  [97.7 F (36.5 C)-98.1 F (36.7 C)] 97.7 F (36.5 C) (07/19 0419) Pulse Rate:  [83-94] 94 (07/19 0816) Resp:  [14-20] 20 (07/19 0816) BP: (99-120)/(56-71) 112/67 (07/19 0816) SpO2:  [95 %-97 %] 95 % (07/19 0816)     Height: 5' 7 (170.2 cm) Weight: 97 kg BMI (Calculated): 33.49   Physical Exam:  Constitutional: alert, cooperative and no distress  Respiratory: breathing non-labored at rest  Cardiovascular: regular rate and sinus rhythm  Gastrointestinal: soft, non-tender, but distended  Labs:     Latest Ref Rng & Units 06/01/2024    5:44 AM 05/31/2024    6:06 AM 05/30/2024    5:15 AM  CBC  WBC 4.0 - 10.5 K/uL 13.2  15.7  15.2   Hemoglobin 13.0 - 17.0 g/dL 9.9  89.9  88.4   Hematocrit 39.0 - 52.0 % 28.9  29.2  35.0   Platelets 150 - 400 K/uL 174  187  180       Latest Ref Rng & Units 06/01/2024    5:44 AM 05/31/2024    6:06 AM 05/30/2024    5:15 AM  CMP  Glucose 70 - 99 mg/dL 869  874  837   BUN 8 - 23 mg/dL 15  14  11    Creatinine 0.61 - 1.24 mg/dL 9.24  9.24  9.10   Sodium 135 - 145 mmol/L 137  138  133   Potassium 3.5 - 5.1 mmol/L 4.2  3.9  3.9   Chloride 98 - 111 mmol/L 106  104  101   CO2 22 - 32 mmol/L 27  26  23    Calcium 8.9 - 10.3 mg/dL 8.0  8.1  7.8   Total Protein 6.5 - 8.1 g/dL  5.1  5.9   Total Bilirubin 0.0 - 1.2 mg/dL  0.5  0.8   Alkaline Phos 38 - 126 U/L  91  118   AST 15 - 41 U/L  29  36   ALT 0 - 44 U/L  12  18     Imaging studies: No new pertinent imaging studies   Assessment/Plan:  Small bowel obstruction - Status post lysis of adhesion and small bowel  resection POD#8 - Initially developed postop ileus then got bowel function return.  Recurrent ileus with dehiscence of the abdominal wall wound with eviscerated omental fat s/p revision and closure of the abdominal wall POD#3 - Continue NGT to low intermittent suction - Continue TPN -Will do Gastrografin  challenge for further evaluation of postoperative ileus - Negative pressure dressing in place. Next change on Monday  Lucas Petrin, MD

## 2024-06-01 NOTE — Consult Note (Addendum)
 PHARMACY - TOTAL PARENTERAL NUTRITION CONSULT NOTE   Indication: Prolonged ileus  Patient Measurements: Height: 5' 7 (170.2 cm) Weight: 97 kg (213 lb 13.5 oz) IBW/kg (Calculated) : 66.1 TPN AdjBW (KG): 71.1 Body mass index is 33.49 kg/m.  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 Admitted with small bowel obstruction. Small bowel resection and appendectomy on 05/24/2024. Noted N/V and abd distention on 7/16. Revision done 7/16 in the evening. Noted prolonged ileus and pharmacy consulted to initiate and manage TPN.  Glucose / Insulin : No hx of diabetes,A1C 4.8% BG 130.  4 units of insulin  given in last 24 hours Electrolytes: HypoPhos -replaced with Kphos outside bag x 1 dose Renal: Scr 0.89 (stable) Hepatic: AST/ALT and Tbili - WNL Intake / Output; MIVF: LR @ 21ml/hr-> 70ml/hr GI Imaging: GI Surgeries / Procedures: laparotomy with small bowel resection and appendectomy on 05/24/2024    Central access: 7/17 TPN start date: 7/17  Nutritional Goals: Goal TPN rate is 80 mL/hr (provides 99.8 g of protein and 1954 kcals per day)  RD Assessment: Estimated Needs Total Energy Estimated Needs: 1900-2200kcal/day Total Protein Estimated Needs: 95-110g/day Total Fluid Estimated Needs: 1.7-2.0L/day  Current Nutrition: NPO  Plan:  Continue TPN at 80 mL/hr at 1800 Electrolytes in TPN: Na 61mEq/L, K 34mEq/L, Ca 3mEq/L, Mg 48mEq/L, and Phos 15mmol/L. Cl:Ac 1:1 Add standard MVI and trace elements to TPN Add thiamine  (day 3) and folic acid  to TPN daily until able to take PO Continue Sensitive q4h SSI and adjust as needed  Monitor TPN labs on Mon/Thurs, daily until stable   Cathaleen Blanch, PharmD, BCPS 06/01/2024 7:57 AM

## 2024-06-01 NOTE — Plan of Care (Signed)
  Problem: Education: Goal: Knowledge of General Education information will improve Description: Including pain rating scale, medication(s)/side effects and non-pharmacologic comfort measures Outcome: Progressing   Problem: Health Behavior/Discharge Planning: Goal: Ability to manage health-related needs will improve Outcome: Progressing   Problem: Clinical Measurements: Goal: Will remain free from infection Outcome: Progressing Goal: Diagnostic test results will improve Outcome: Progressing Goal: Respiratory complications will improve Outcome: Progressing Goal: Cardiovascular complication will be avoided Outcome: Progressing   Problem: Activity: Goal: Risk for activity intolerance will decrease Outcome: Progressing   Problem: Nutrition: Goal: Adequate nutrition will be maintained Outcome: Progressing   Problem: Coping: Goal: Level of anxiety will decrease Outcome: Progressing   Problem: Elimination: Goal: Will not experience complications related to bowel motility Outcome: Progressing Goal: Will not experience complications related to urinary retention Outcome: Progressing   Problem: Safety: Goal: Ability to remain free from injury will improve Outcome: Progressing   Problem: Skin Integrity: Goal: Risk for impaired skin integrity will decrease Outcome: Progressing

## 2024-06-02 DIAGNOSIS — K56609 Unspecified intestinal obstruction, unspecified as to partial versus complete obstruction: Secondary | ICD-10-CM | POA: Diagnosis not present

## 2024-06-02 LAB — GLUCOSE, CAPILLARY
Glucose-Capillary: 134 mg/dL — ABNORMAL HIGH (ref 70–99)
Glucose-Capillary: 137 mg/dL — ABNORMAL HIGH (ref 70–99)
Glucose-Capillary: 141 mg/dL — ABNORMAL HIGH (ref 70–99)
Glucose-Capillary: 149 mg/dL — ABNORMAL HIGH (ref 70–99)
Glucose-Capillary: 158 mg/dL — ABNORMAL HIGH (ref 70–99)
Glucose-Capillary: 163 mg/dL — ABNORMAL HIGH (ref 70–99)
Glucose-Capillary: 167 mg/dL — ABNORMAL HIGH (ref 70–99)

## 2024-06-02 LAB — PHOSPHORUS: Phosphorus: 2.7 mg/dL (ref 2.5–4.6)

## 2024-06-02 LAB — MAGNESIUM: Magnesium: 2.1 mg/dL (ref 1.7–2.4)

## 2024-06-02 MED ORDER — TRAVASOL 10 % IV SOLN
INTRAVENOUS | Status: AC
  Filled 2024-06-02: qty 998.4

## 2024-06-02 NOTE — Plan of Care (Signed)

## 2024-06-02 NOTE — Consult Note (Signed)
 PHARMACY - TOTAL PARENTERAL NUTRITION CONSULT NOTE   Indication: Prolonged ileus  Patient Measurements: Height: 5' 7 (170.2 cm) Weight: 99.8 kg (220 lb 0.3 oz) IBW/kg (Calculated) : 66.1 TPN AdjBW (KG): 71.1 Body mass index is 34.46 kg/m.  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 Admitted with small bowel obstruction. Small bowel resection and appendectomy on 05/24/2024. Noted N/V and abd distention on 7/16. Revision done 7/16 in the evening. Noted prolonged ileus and pharmacy consulted to initiate and manage TPN.  Glucose / Insulin : No hx of diabetes,A1C 4.8% BG 149.  5 units of insulin  given in last 24 hours Electrolytes: HypoPhos -replaced with Kphos outside bag x 1 dose Renal: Scr 0.89 (stable) Hepatic: AST/ALT and Tbili - WNL Intake / Output; MIVF: LR @ 16ml/hr-> 59ml/hr GI Imaging: GI Surgeries / Procedures: laparotomy with small bowel resection and appendectomy on 05/24/2024    Central access: 7/17 TPN start date: 7/17  Nutritional Goals: Goal TPN rate is 80 mL/hr (provides 99.8 g of protein and 1954 kcals per day)  RD Assessment: Estimated Needs Total Energy Estimated Needs: 1900-2200kcal/day Total Protein Estimated Needs: 95-110g/day Total Fluid Estimated Needs: 1.7-2.0L/day  Current Nutrition: NPO  Plan:  Continue TPN at 80 mL/hr at 1800 Electrolytes in TPN: Na 50mEq/L, K 41mEq/L, Ca 27mEq/L, Mg 75mEq/L, and Phos 15mmol/L. Cl:Ac 1:1 Add standard MVI and trace elements to TPN Add thiamine  (day 4) and folic acid  to TPN daily until able to take PO Continue Sensitive q4h SSI and adjust as needed  Monitor TPN labs on Mon/Thurs, daily until stable   Cathaleen Blanch, PharmD, BCPS 06/02/2024 9:21 AM

## 2024-06-02 NOTE — Progress Notes (Signed)
 Patient ID: Stephen [name], male   DOB: 1946-04-09, 78 y.o.   MRN: [id number]     SURGICAL PROGRESS NOTE   Hospital Day(s): 13.   Interval History: Patient seen and examined, no acute events or new complaints overnight. Patient reports feeling better today.  He endorsed that he had good colostomy output last night.  Denies any abdominal pain.  Denies any nausea or vomiting.  Vital signs in last 24 hours: [min-max] current  Temp:  [97.6 F (36.4 C)-98.2 F (36.8 C)] 97.8 F (36.6 C) (07/20 0746) Pulse Rate:  [79-92] 88 (07/20 0746) Resp:  [16-18] 18 (07/20 0746) BP: (90-113)/(58-96) 111/58 (07/20 0746) SpO2:  [93 %-100 %] 100 % (07/20 0746) Weight:  [99.8 kg] 99.8 kg (07/20 0242)     Height: 5' 7 (170.2 cm) Weight: 99.8 kg BMI (Calculated): 34.45   Physical Exam:  Constitutional: alert, cooperative and no distress  Respiratory: breathing non-labored at rest  Cardiovascular: regular rate and sinus rhythm  Gastrointestinal: soft, non-tender, and mildly-distended.  Negative pressure dressing in place  Labs:     Latest Ref Rng & Units 06/01/2024    5:44 AM 05/31/2024    6:06 AM 05/30/2024    5:15 AM  CBC  WBC 4.0 - 10.5 K/uL 13.2  15.7  15.2   Hemoglobin 13.0 - 17.0 g/dL 9.9  89.9  88.4   Hematocrit 39.0 - 52.0 % 28.9  29.2  35.0   Platelets 150 - 400 K/uL 174  187  180       Latest Ref Rng & Units 06/01/2024    5:44 AM 05/31/2024    6:06 AM 05/30/2024    5:15 AM  CMP  Glucose 70 - 99 mg/dL 869  874  837   BUN 8 - 23 mg/dL 15  14  11    Creatinine 0.61 - 1.24 mg/dL 9.24  9.24  9.10   Sodium 135 - 145 mmol/L 137  138  133   Potassium 3.5 - 5.1 mmol/L 4.2  3.9  3.9   Chloride 98 - 111 mmol/L 106  104  101   CO2 22 - 32 mmol/L 27  26  23    Calcium 8.9 - 10.3 mg/dL 8.0  8.1  7.8   Total Protein 6.5 - 8.1 g/dL  5.1  5.9   Total Bilirubin 0.0 - 1.2 mg/dL  0.5  0.8   Alkaline Phos 38 - 126 U/L  91  118   AST 15 - 41 U/L  29  36   ALT 0 - 44 U/L  12  18     Imaging studies:  Abdominal x-ray shows contrast in large intestine.  No significant small bowel dilation.   Assessment/Plan:  Small bowel obstruction - Status post lysis of adhesion and small bowel resection POD#9 - Initially developed postop ileus then got bowel function return.  Recurrent ileus with dehiscence of the abdominal wall wound with eviscerated omental fat s/p revision and closure of the abdominal wall POD#4 - Abdominal x-ray shows improved small bowel dilation.  Colon dressing large intestine. -Patient had good output through colostomy last night -Will clamp NGT and give clear liquid diet trial. - Continue TPN - Negative pressure dressing in place. Next change on Monday  Lucas Petrin, MD

## 2024-06-02 NOTE — Progress Notes (Signed)
 Progress Note   Patient: Stephen [name] FMW:[id number] DOB: Jan 09, 1946 DOA: 05/20/2024     13 DOS: the patient was seen and examined on 06/02/2024    Brief hospital course:   From admission h and p   [name] is a 78 y.o. 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  His abdominal pain is mainly in the upper abdomen, constant, mild to moderate, aching, nonradiating, not aggravated or alleviated by any known factors. He has nausea and multiple episodes of vomiting with coffee-ground emesis.  He still has small amount of output from colostomy.  No fever or chills.  Denies chest pain or SOB.  Patient has mild dry cough.  He uses condom catheter and reports burning on urination, no dysuria or hematuria.   Assessment & Plan:   Small bowel obstruction s/p surgery Repeat abdominal imaging showing persistence of SBO General Surgery took patient for exploratory laparotomy with small bowel resection and appendectomy on 05/24/2024 NG tube removed on 05/28/2024 and then reinserted on 05/30/2024 Patient experiencing postop ileus General Surgery on board and advancing diet Continue as needed pain regimen Continue TPN   # History rectal cancer # Colostomy status # New liver and lung masses With isolated met to left lung, s/p resection, adjuvant chemo. Last oncology visit in 2022, lost to f/u, wife says can't transport him to doctor appointments. Here CT of abdomen/pelvis shows left lung base nodule as well as liver nodule. MRI of liver and CT of chest consistent with probable malignancy  (metastases vs new primary) Discussed with Dr. Christa and at this point no more intervention planned Oncologist have discussed with patient as well as patient's wife.  They know this is likely recurrent cancer and since patient has been bedridden there is nothing oncology can offer.  Patient does not want a biopsy.  Wife also not ready for hospice. They have agreed that once his SBO is managed, patient will be discharged home.   # AKI # Lactic acidosis Likely prerenal 2/2 above process. Resolved   # Acute cystitis Ua suggestive of possible infection and does endorse recent dysuria. Culture growing multiple species Will complete 5 days course on 05/28/2024   # Bedbound Wife says this was last 5 years   # HTN Bp normal, doesn't appear to be on meds at home   # Alcohol use Few drinks a day, denies withdrawal history, no s/s of such here - monitor   # Macrocytosis B12 and folate wnl     DVT prophylaxis: scds Code Status: dnr/dni Family Communication: wife dora updated at bedside 7/15   Level of care: Telemetry Medical Status is: Inpatient Remains inpatient appropriate because: severity of illness       Consultants:  Gen surg   Procedures: NG tube placement Exploratory laparotomy with small bowel resection and appendectomy   Antimicrobials:  none      Subjective: Patient seen and examined at bedside this morning He denies worsening abdominal pain chest pain or cough NG tube have been clamped   Examination:   General exam: Appears calm and comfortable  Respiratory system: Clear to auscultation. Respiratory effort normal. Cardiovascular system: S1 &  S2 heard, RRR.  Gastrointestinal system: Midline surgical incisional dressing is clean and dry, colostomy in place Central nervous system: Alert and oriented. No focal neurological deficits. Extremities: warm. Decreased power and muscle mass in LEs Skin: No rashes, lesions or ulcers Psychiatry: Judgement and  insight appear normal. Mood & affect appropriate.    Data Reviewed:  Vitals:   06/01/24 2049 06/02/24 0242 06/02/24 0431 06/02/24 0746  BP: (!) 102/59  (!) 113/96 (!) 111/58  Pulse: 79  92 88  Resp: 16  17 18   Temp: 97.6 F (36.4 C)  97.8 F (36.6 C) 97.8 F (36.6 C)  TempSrc:   Oral Oral  SpO2: 93%  98% 100%  Weight:  99.8 kg    Height:          Latest Ref Rng & Units 06/01/2024    5:44 AM 05/31/2024    6:06 AM 05/30/2024    5:15 AM  CBC  WBC 4.0 - 10.5 K/uL 13.2  15.7  15.2   Hemoglobin 13.0 - 17.0 g/dL 9.9  89.9  88.4   Hematocrit 39.0 - 52.0 % 28.9  29.2  35.0   Platelets 150 - 400 K/uL 174  187  180        Latest Ref Rng & Units 06/01/2024    5:44 AM 05/31/2024    6:06 AM 05/30/2024    5:15 AM  BMP  Glucose 70 - 99 mg/dL 869  874  837   BUN 8 - 23 mg/dL 15  14  11    Creatinine 0.61 - 1.24 mg/dL 9.24  9.24  9.10   Sodium 135 - 145 mmol/L 137  138  133   Potassium 3.5 - 5.1 mmol/L 4.2  3.9  3.9   Chloride 98 - 111 mmol/L 106  104  101   CO2 22 - 32 mmol/L 27  26  23    Calcium 8.9 - 10.3 mg/dL 8.0  8.1  7.8        Author: Drue ONEIDA Potter, MD 06/02/2024 4:27 PM  For on call review [email or web address].

## 2024-06-03 DIAGNOSIS — K56609 Unspecified intestinal obstruction, unspecified as to partial versus complete obstruction: Secondary | ICD-10-CM | POA: Diagnosis not present

## 2024-06-03 LAB — COMPREHENSIVE METABOLIC PANEL WITH GFR
ALT: 16 U/L (ref 0–44)
AST: 34 U/L (ref 15–41)
Albumin: 2.2 g/dL — ABNORMAL LOW (ref 3.5–5.0)
Alkaline Phosphatase: 101 U/L (ref 38–126)
Anion gap: 7 (ref 5–15)
BUN: 18 mg/dL (ref 8–23)
CO2: 27 mmol/L (ref 22–32)
Calcium: 8.6 mg/dL — ABNORMAL LOW (ref 8.9–10.3)
Chloride: 100 mmol/L (ref 98–111)
Creatinine, Ser: 0.75 mg/dL (ref 0.61–1.24)
GFR, Estimated: >60 mL/min (ref >60)
Glucose, Bld: 166 mg/dL — ABNORMAL HIGH (ref 70–99)
Potassium: 5.2 mmol/L — ABNORMAL HIGH (ref 3.5–5.1)
Sodium: 134 mmol/L — ABNORMAL LOW (ref 135–145)
Total Bilirubin: 0.3 mg/dL (ref 0.0–1.2)
Total Protein: 5.6 g/dL — ABNORMAL LOW (ref 6.5–8.1)

## 2024-06-03 LAB — CBC WITH DIFFERENTIAL/PLATELET
Abs Immature Granulocytes: 0.32 K/uL — ABNORMAL HIGH (ref 0.00–0.07)
Basophils Absolute: 0.1 K/uL (ref 0.0–0.1)
Basophils Relative: 1 %
Eosinophils Absolute: 0.4 K/uL (ref 0.0–0.5)
Eosinophils Relative: 3 %
HCT: 29.3 % — ABNORMAL LOW (ref 39.0–52.0)
Hemoglobin: 9.7 g/dL — ABNORMAL LOW (ref 13.0–17.0)
Immature Granulocytes: 2 %
Lymphocytes Relative: 10 %
Lymphs Abs: 1.3 K/uL (ref 0.7–4.0)
MCH: 34.9 pg — ABNORMAL HIGH (ref 26.0–34.0)
MCHC: 33.1 g/dL (ref 30.0–36.0)
MCV: 105.4 fL — ABNORMAL HIGH (ref 80.0–100.0)
Monocytes Absolute: 1 K/uL (ref 0.1–1.0)
Monocytes Relative: 8 %
Neutro Abs: 10.2 K/uL — ABNORMAL HIGH (ref 1.7–7.7)
Neutrophils Relative %: 76 %
Platelets: 179 K/uL (ref 150–400)
RBC: 2.78 MIL/uL — ABNORMAL LOW (ref 4.22–5.81)
RDW: 14 % (ref 11.5–15.5)
WBC: 13.2 K/uL — ABNORMAL HIGH (ref 4.0–10.5)
nRBC: 0 % (ref 0.0–0.2)

## 2024-06-03 LAB — PHOSPHORUS: Phosphorus: 3.2 mg/dL (ref 2.5–4.6)

## 2024-06-03 LAB — GLUCOSE, CAPILLARY
Glucose-Capillary: 161 mg/dL — ABNORMAL HIGH (ref 70–99)
Glucose-Capillary: 168 mg/dL — ABNORMAL HIGH (ref 70–99)
Glucose-Capillary: 185 mg/dL — ABNORMAL HIGH (ref 70–99)
Glucose-Capillary: 176 mg/dL — ABNORMAL HIGH (ref 70–99)
Glucose-Capillary: 200 mg/dL — ABNORMAL HIGH (ref 70–99)

## 2024-06-03 LAB — MAGNESIUM: Magnesium: 2.2 mg/dL (ref 1.7–2.4)

## 2024-06-03 LAB — TRIGLYCERIDES: Triglycerides: 92 mg/dL (ref <150)

## 2024-06-03 MED ORDER — TRAVASOL 10 % IV SOLN
INTRAVENOUS | Status: AC
  Filled 2024-06-03: qty 998.4

## 2024-06-03 NOTE — Progress Notes (Signed)
 Progress Note   Patient: Stephen [name] FMW:[id number] DOB: [birth year] DOA: 05/20/2024     14 DOS: the patient was seen and examined on 06/03/2024     Brief hospital course:   From admission h and p   [name] is a 78 y.o. 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  His abdominal pain is mainly in the upper abdomen, constant, mild to moderate, aching, nonradiating, not aggravated or alleviated by any known factors. He has nausea and multiple episodes of vomiting with coffee-ground emesis.  He still has small amount of output from colostomy.  No fever or chills.  Denies chest pain or SOB.  Patient has mild dry cough.  He uses condom catheter and reports burning on urination, no dysuria or hematuria.   Assessment & Plan:   Small bowel obstruction s/p surgery Repeat abdominal imaging showing persistence of SBO General Surgery took patient for exploratory laparotomy with small bowel resection and appendectomy on 05/24/2024 NG tube removed on 05/28/2024 and then reinserted on 05/30/2024 and removed on 06/03/2024 Patient experiencing postop ileus General Surgery on board and advancing diet Continue as needed pain regimen Continue TPN   # History rectal cancer # Colostomy status # New liver and lung masses With isolated met to left lung, s/p resection, adjuvant chemo. Last oncology visit in 2022, lost to f/u, wife says can't transport him to doctor appointments. Here CT of abdomen/pelvis shows left lung base nodule as well as liver nodule. MRI of liver and CT of chest consistent  with probable malignancy (metastases vs new primary) Discussed with Dr. Christa and at this point no more intervention planned Oncologist have discussed with patient as well as patient's wife.  They know this is likely recurrent cancer and since patient has been bedridden there is nothing oncology can offer.  Patient does not want a biopsy.  Wife also not ready for hospice. They have agreed that once his SBO is managed, patient will be discharged home.   # AKI # Lactic acidosis Likely prerenal 2/2 above process. Resolved   # Acute cystitis Ua suggestive of possible infection and does endorse recent dysuria. Culture growing multiple species Will complete 5 days course on 05/28/2024   # Bedbound Wife says this was last 5 years   # HTN Bp normal, doesn't appear to be on meds at home   # Alcohol use Few drinks a day, denies withdrawal history, no s/s of such here - monitor   # Macrocytosis B12 and folate wnl     DVT prophylaxis: scds Code Status: dnr/dni Family Communication: wife dora updated at bedside 7/15   Level of care: Telemetry Medical Status is: Inpatient Remains inpatient appropriate because: severity of illness       Consultants:  Gen surg   Procedures: NG tube placement Exploratory laparotomy with small bowel resection and appendectomy   Antimicrobials:  none      Subjective: Patient seen and examined at bedside this morning He denies worsening abdominal pain chest pain or cough NG tube removed today   Examination:   General exam: Appears calm and comfortable  Respiratory system: Clear to auscultation. Respiratory effort normal.  Cardiovascular system: S1 & S2 heard, RRR.  Gastrointestinal system: Midline surgical incisional dressing is clean and dry, colostomy in place Central nervous system: Alert and oriented. No focal neurological deficits. Extremities: warm. Decreased power and muscle mass in LEs Skin: No rashes, lesions or ulcers Psychiatry:  Judgement and insight appear normal. Mood & affect appropriate.    Data Reviewed:  Vitals:   06/03/24 0412 06/03/24 0423 06/03/24 0806 06/03/24 1022  BP: (!) 93/53  (!) 95/58 103/65  Pulse:   89 92  Resp:   16   Temp:   98.3 F (36.8 C) 97.6 F (36.4 C)  TempSrc:   Oral Oral  SpO2:   97% 95%  Weight:  96.7 kg    Height:          Latest Ref Rng & Units 06/03/2024    5:05 AM 06/01/2024    5:44 AM 05/31/2024    6:06 AM  CBC  WBC 4.0 - 10.5 K/uL 13.2  13.2  15.7   Hemoglobin 13.0 - 17.0 g/dL 9.7  9.9  89.9   Hematocrit 39.0 - 52.0 % 29.3  28.9  29.2   Platelets 150 - 400 K/uL 179  174  187        Latest Ref Rng & Units 06/03/2024    5:05 AM 06/01/2024    5:44 AM 05/31/2024    6:06 AM  BMP  Glucose 70 - 99 mg/dL 833  869  874   BUN 8 - 23 mg/dL 18  15  14    Creatinine 0.61 - 1.24 mg/dL 9.24  9.24  9.24   Sodium 135 - 145 mmol/L 134  137  138   Potassium 3.5 - 5.1 mmol/L 5.2  4.2  3.9   Chloride 98 - 111 mmol/L 100  106  104   CO2 22 - 32 mmol/L 27  27  26    Calcium 8.9 - 10.3 mg/dL 8.6  8.0  8.1      Author: Drue ONEIDA Potter, MD 06/03/2024 3:25 PM  For on call review [email or web address].

## 2024-06-03 NOTE — Progress Notes (Addendum)
 [facility]- General Surgery  SURGICAL PROGRESS NOTE  Hospital Day(s): 14.   Post op day(s):  10 Days Post-op: exploratory laparotomy with small bowel resection and appendectomy  5 Days Post-op: re-opening of recent laparotomy with closure of abdominal wall   Interval History:  Patient seen and examined. States he is doing well. NGT clamped and tolerated clear liquids. Denies any nausea or vomiting. Abdominal pain controlled with Morphine . Colostomy output from yesterday is uncertain.    Vital signs in last 24 hours: [min-max] current  Temp:  [97.5 F (36.4 C)-98.4 F (36.9 C)] 97.7 F (36.5 C) (07/21 0353) Pulse Rate:  [85-88] 85 (07/21 0353) Resp:  [17-18] 18 (07/21 0353) BP: (90-111)/(31-58) 93/53 (07/21 0412) SpO2:  [90 %-100 %] 90 % (07/21 0353) Weight:  [96.7 kg] 96.7 kg (07/21 0423)     Height: 5' 7 (170.2 cm) Weight: 96.7 kg BMI (Calculated): 33.38   Intake/Output last 2 shifts:  07/20 0701 - 07/21 0700 In: 2141.5 [P.O.:240; I.V.:1901.5] Out: 2150 [Urine:1775; Drains:375]   Physical Exam:  Constitutional: alert, cooperative and no distress  Respiratory: breathing non-labored at rest  Cardiovascular: regular rate and sinus rhythm  Gastrointestinal: soft, mildly tender, and non-distended, performed dressing change  Labs:     Latest Ref Rng & Units 06/03/2024    5:05 AM 06/01/2024    5:44 AM 05/31/2024    6:06 AM  CBC  WBC 4.0 - 10.5 K/uL 13.2  13.2  15.7   Hemoglobin 13.0 - 17.0 g/dL 9.7  9.9  89.9   Hematocrit 39.0 - 52.0 % 29.3  28.9  29.2   Platelets 150 - 400 K/uL 179  174  187       Latest Ref Rng & Units 06/03/2024    5:05 AM 06/01/2024    5:44 AM 05/31/2024    6:06 AM  CMP  Glucose 70 - 99 mg/dL 833  869  874   BUN 8 - 23 mg/dL 18  15  14    Creatinine 0.61 - 1.24 mg/dL 9.24  9.24  9.24   Sodium 135 - 145 mmol/L 134  137  138   Potassium 3.5 - 5.1 mmol/L 5.2  4.2  3.9   Chloride 98 - 111 mmol/L 100  106  104   CO2 22 - 32 mmol/L 27  27  26     Calcium 8.9 - 10.3 mg/dL 8.6  8.0  8.1   Total Protein 6.5 - 8.1 g/dL 5.6   5.1   Total Bilirubin 0.0 - 1.2 mg/dL 0.3   0.5   Alkaline Phos 38 - 126 U/L 101   91   AST 15 - 41 U/L 34   29   ALT 0 - 44 U/L 16   12     Imaging studies: No new pertinent imaging studies  Procedure: Changed previous wound vac. Cleansed with Vashe wound solution. A negative pressure dressing (DME) was placed in steps, covering a 20 cm x 2 cm (40 cm area). Wound healing as expected. No signs of infection noted.  I personally changed wound vac with Dr. Polly supervision.   Assessment/Plan:  78 y.o. male with small bowel obstruction 10 Days Post-op s/p exploratory laparotomy with small bowel resection and appendectomy, and 5 Days post-op revision and closure of abdominal wall due to dehiscence and evisceration of omental fat, complicated by pertinent comorbidities including HFpEF, alcohol use, metastatic rectal cancer with Foley and colostomy, HTN, AKI, and UTI.   - Hypotensive with otherwise stable vital  signs, stable Hgb   - Leukocytosis 13.2   - Discontinue NGT and start full liquid diet    - Continue to monitor colostomy output, day prior had large output  - Changed wound vac, plan another dressing change later this week  - Continue TPN until patient is tolerating diet  - Plan to try different pain medication other than Morphine   - Continue DVT prophylaxis   Informed wife at bedside she will need assistance with dressing changes at home minimum 2x a week with wound vac when time for discharge. Will need to coordinate with SW.   -- Gilmer Cea PA-C

## 2024-06-03 NOTE — Consult Note (Signed)
 PHARMACY - TOTAL PARENTERAL NUTRITION CONSULT NOTE   Indication: Prolonged ileus  Patient Measurements: Height: 5' 7 (170.2 cm) Weight: 96.7 kg (213 lb 3 oz) IBW/kg (Calculated) : 66.1 TPN AdjBW (KG): 71.1 Body mass index is 33.39 kg/m.  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 Admitted with small bowel obstruction. Small bowel resection and appendectomy on 05/24/2024. Noted N/V and abd distention on 7/16. Revision done 7/16 in the evening. Noted prolonged ileus and pharmacy consulted to initiate and manage TPN.  Glucose / Insulin : No hx of diabetes,A1C 4.8% BG 134-167.  6 units of insulin  given in last 24 hours Electrolytes: K = 5.2, will reduce K in TPN Renal: Scr < 1 (stable) Hepatic: AST/ALT and Tbili - WNL Intake / Output; MIVF:  Intake/Output Summary (Last 24 hours) at 06/03/2024 9072 Last data filed at 06/03/2024 9581 Gross per 24 hour  Intake 2141.52 ml  Output 2150 ml  Net -8.48 ml    GI Imaging: GI Surgeries / Procedures: laparotomy with small bowel resection and appendectomy on 05/24/2024    Central access: 7/17 TPN start date: 7/17  Nutritional Goals: Goal TPN rate is 80 mL/hr (provides 99.8 g of protein and 1954 kcals per day)  RD Assessment: Estimated Needs Total Energy Estimated Needs: 1900-2200kcal/day Total Protein Estimated Needs: 95-110g/day Total Fluid Estimated Needs: 1.7-2.0L/day  Current Nutrition: FLD  Plan:  Continue TPN at 80 mL/hr at 1800 Electrolytes in TPN: Na 37mEq/L, K 50mEq/L (Will reduce to 30 mEq/L 7/21), Ca 26mEq/L, Mg 47mEq/L, and Phos 15mmol/L. Cl:Ac 1:1 Add standard MVI and trace elements to TPN Add thiamine  (day 4) and folic acid  to TPN daily until able to take PO Continue Sensitive q4h SSI and adjust as needed  Monitor TPN labs on Mon/Thurs,  daily until stable   Mose Blew, PharmD, BCPS 06/03/2024 9:22 AM

## 2024-06-04 DIAGNOSIS — K56609 Unspecified intestinal obstruction, unspecified as to partial versus complete obstruction: Secondary | ICD-10-CM | POA: Diagnosis not present

## 2024-06-04 LAB — CBC WITH DIFFERENTIAL/PLATELET
Abs Immature Granulocytes: 0.27 K/uL — ABNORMAL HIGH (ref 0.00–0.07)
Basophils Absolute: 0.1 K/uL (ref 0.0–0.1)
Basophils Relative: 1 %
Eosinophils Absolute: 0.5 K/uL (ref 0.0–0.5)
Eosinophils Relative: 4 %
HCT: 29.5 % — ABNORMAL LOW (ref 39.0–52.0)
Hemoglobin: 9.7 g/dL — ABNORMAL LOW (ref 13.0–17.0)
Immature Granulocytes: 2 %
Lymphocytes Relative: 11 %
Lymphs Abs: 1.2 K/uL (ref 0.7–4.0)
MCH: 34.4 pg — ABNORMAL HIGH (ref 26.0–34.0)
MCHC: 32.9 g/dL (ref 30.0–36.0)
MCV: 104.6 fL — ABNORMAL HIGH (ref 80.0–100.0)
Monocytes Absolute: 1.2 K/uL — ABNORMAL HIGH (ref 0.1–1.0)
Monocytes Relative: 11 %
Neutro Abs: 8.1 K/uL — ABNORMAL HIGH (ref 1.7–7.7)
Neutrophils Relative %: 71 %
Platelets: 182 K/uL (ref 150–400)
RBC: 2.82 MIL/uL — ABNORMAL LOW (ref 4.22–5.81)
RDW: 13.7 % (ref 11.5–15.5)
WBC: 11.2 K/uL — ABNORMAL HIGH (ref 4.0–10.5)
nRBC: 0 % (ref 0.0–0.2)

## 2024-06-04 LAB — BASIC METABOLIC PANEL WITH GFR
Anion gap: 6 (ref 5–15)
BUN: 19 mg/dL (ref 8–23)
CO2: 26 mmol/L (ref 22–32)
Calcium: 8.6 mg/dL — ABNORMAL LOW (ref 8.9–10.3)
Chloride: 102 mmol/L (ref 98–111)
Creatinine, Ser: 0.77 mg/dL (ref 0.61–1.24)
GFR, Estimated: >60 mL/min (ref >60)
Glucose, Bld: 169 mg/dL — ABNORMAL HIGH (ref 70–99)
Potassium: 4.9 mmol/L (ref 3.5–5.1)
Sodium: 134 mmol/L — ABNORMAL LOW (ref 135–145)

## 2024-06-04 LAB — GLUCOSE, CAPILLARY
Glucose-Capillary: 165 mg/dL — ABNORMAL HIGH (ref 70–99)
Glucose-Capillary: 169 mg/dL — ABNORMAL HIGH (ref 70–99)
Glucose-Capillary: 179 mg/dL — ABNORMAL HIGH (ref 70–99)
Glucose-Capillary: 182 mg/dL — ABNORMAL HIGH (ref 70–99)
Glucose-Capillary: 186 mg/dL — ABNORMAL HIGH (ref 70–99)
Glucose-Capillary: 198 mg/dL — ABNORMAL HIGH (ref 70–99)

## 2024-06-04 LAB — MAGNESIUM: Magnesium: 2.2 mg/dL (ref 1.7–2.4)

## 2024-06-04 MED ORDER — TRAVASOL 10 % IV SOLN
INTRAVENOUS | Status: AC
  Filled 2024-06-04: qty 1036.8

## 2024-06-04 MED ORDER — VITAMIN C 500 MG PO TABS
500.0000 mg | ORAL_TABLET | Freq: Two times a day (BID) | ORAL | Status: DC
  Administered 2024-06-04 – 2024-06-12 (×16): 500 mg via ORAL
  Filled 2024-06-04 (×16): qty 1

## 2024-06-04 MED ORDER — MORPHINE SULFATE (PF) 2 MG/ML IV SOLN
2.0000 mg | INTRAVENOUS | Status: DC | PRN
  Administered 2024-06-04 – 2024-06-07 (×7): 2 mg via INTRAVENOUS
  Filled 2024-06-04 (×7): qty 1

## 2024-06-04 MED ORDER — ENSURE PLUS HIGH PROTEIN PO LIQD
237.0000 mL | Freq: Three times a day (TID) | ORAL | Status: DC
  Administered 2024-06-04 – 2024-06-08 (×9): 237 mL via ORAL
  Administered 2024-06-10: 100 mL via ORAL
  Administered 2024-06-10 – 2024-06-12 (×7): 237 mL via ORAL

## 2024-06-04 NOTE — Progress Notes (Signed)
 [facility]- General Surgery  SURGICAL PROGRESS NOTE  Hospital Day(s): 15.   Post op day(s):  11 Days Post-op: exploratory laparotomy with small bowel resection and appendectomy  6 Days Post-op: re-opening of recent laparotomy with closure of abdominal wall   Interval History:  Patient seen and examined. Discontinued NGT and started patient on full liquid diet yesterday. Wife states he is not really eating or drinking anything. Patient was complaining of abdominal this comfort this morning. There was some stool in colostomy this morning but not sure when bag was last emptied.    Vital signs in last 24 hours: [min-max] current  Temp:  [97.5 F (36.4 C)-98.7 F (37.1 C)] 98.4 F (36.9 C) (07/22 0736) Pulse Rate:  [83-96] 84 (07/22 0743) Resp:  [15-18] 16 (07/22 0743) BP: (92-159)/(53-75) 115/73 (07/22 0743) SpO2:  [93 %-100 %] 100 % (07/22 0743)     Height: 5' 7 (170.2 cm) Weight: 96.7 kg BMI (Calculated): 33.38   Intake/Output last 2 shifts:  07/21 0701 - 07/22 0700 In: 868.2 [I.V.:868.2] Out: 1170 [Urine:1100; Stool:70]   Physical Exam:  Constitutional: alert, cooperative and no distress  Respiratory: breathing non-labored at rest  Cardiovascular: regular rate and sinus rhythm  Gastrointestinal: soft, tender, and mildly distended, wound vac intact   Labs:     Latest Ref Rng & Units 06/04/2024    5:11 AM 06/03/2024    5:05 AM 06/01/2024    5:44 AM  CBC  WBC 4.0 - 10.5 K/uL 11.2  13.2  13.2   Hemoglobin 13.0 - 17.0 g/dL 9.7  9.7  9.9   Hematocrit 39.0 - 52.0 % 29.5  29.3  28.9   Platelets 150 - 400 K/uL 182  179  174       Latest Ref Rng & Units 06/04/2024    5:11 AM 06/03/2024    5:05 AM 06/01/2024    5:44 AM  CMP  Glucose 70 - 99 mg/dL 830  833  869   BUN 8 - 23 mg/dL 19  18  15    Creatinine 0.61 - 1.24 mg/dL 9.22  9.24  9.24   Sodium 135 - 145 mmol/L 134  134  137   Potassium 3.5 - 5.1 mmol/L 4.9  5.2  4.2   Chloride 98 - 111 mmol/L 102  100  106   CO2 22 -  32 mmol/L 26  27  27    Calcium 8.9 - 10.3 mg/dL 8.6  8.6  8.0   Total Protein 6.5 - 8.1 g/dL  5.6    Total Bilirubin 0.0 - 1.2 mg/dL  0.3    Alkaline Phos 38 - 126 U/L  101    AST 15 - 41 U/L  34    ALT 0 - 44 U/L  16      Imaging studies: No new pertinent imaging studies   Assessment/Plan:  78 y.o. male with small bowel obstruction 11 Days Post-op s/p exploratory laparotomy with small bowel resection and appendectomy, and   6 Days Post-Op s/p revision and closure of abdominal wall due to dehiscence and evisceration of omental fat, complicated by pertinent comorbidities including HFpEF, alcohol use, metastatic rectal cancer with Foley and colostomy, HTN, AKI, and UTI.     - No fever, not tachycardic and leukocytosis improving 13.2 >> 11.2   - Continue with full liquid diet, no change since patient was experiencing abdominal pain and was mildly distended on exam this morning   - Continue TPN    - Continue  to monitor colostomy output  - Continue pain management and DVT prophylaxis    -- Jaxan Michel Barrientos PA-C

## 2024-06-04 NOTE — Progress Notes (Signed)
 Nutrition Follow Up Note   DOCUMENTATION CODES:   Not applicable  INTERVENTION:   Ensure Plus High Protein po TID, each supplement provides 350 kcal and 20 grams of protein  Magic cup TID with meals, each supplement provides 290 kcal and 9 grams of protein  MVI po daily once TPN discontinued   Vitamin C  500mg  po BID   Continue TPN per pharmacy- provides 1904kcal/day and 103g/day protein   Continue thiamine , folic acid  and MVI daily in TPN  Pyridoxine  100mg  IV daily x 1 week  Daily weights   NUTRITION DIAGNOSIS:   Inadequate oral intake related to acute illness as evidenced by NPO status. -ongoing   GOAL:   Patient will meet greater than or equal to 90% of their needs -met   MONITOR:   PO intake, Supplement acceptance, Labs, Weight trends, Skin, I & O's, Other (Comment) (TPN)  ASSESSMENT:   78 y/o male with h/o HTN, etoh abuse, GERD, CHF, esophageal stricture, neuropathy, LE DVT, depression, bedbound and metastatic rectal cancer s/p chemoradiation and abdominoperineal proctocelectomy (2015) with metastasis to the lung s/p left lung lobectomy 2015 who is admitted with UTI, AKI and SBO now s/p exploratory laparotomy with small bowel resection (27cm terminal ileum) & appendectomy 7/11 complicated by abdominal wall dehiscence s/p re-opening of recent laparotomy with closure of abdominal wall 7/16.  Pt continues to tolerate TPN well at goal rate. Refeed labs stable. Mild hyperglycemia. Triglycerides wnl. Pt initiated on a full liquid diet yesterday. Pt continues to have poor appetite and oral intake in hospital; pt eating <20% of meals. RD will add Ensure and Magic Cup supplements; however, pt refusing most of them previously. RD will add vitamin C  to support wound healing. Pt with B6 deficiency and is receiving supplementation. Pt is having bowel function. May need to consider dobhoff tube placement and nutrition support once TPN discontinued. Per chart, pt is up ~30lbs since  admission and is up ~26lbs from his UBW.      Medications reviewed and include: lovenox , insulin , protonix , miralax , B6,  thiamine , TPN  Labs reviewed: Na 134(L), K 4.9 wnl, Mg 2.2 wnl P 3.2 wnl- 7/21 Triglycerides- 92- 7/21 B12- 1724(H), folate 12.1 wnl, vitamin D  111.09(H), B6 2.9(L)- 7/9 Wbc- 11.2(H), Hgb 9.7(L), Hct 29.5(L), MCV 104.6(H), MCH 34.4(H) Cbgs- 182, 186, 179, 165, 169 x 24 hrs   UOP-  Drains- 0ml   Diet Order:    Diet Order             Diet full liquid Room service appropriate? Yes; Fluid consistency: Thin  Diet effective now                  EDUCATION NEEDS:   Not appropriate for education at this time  Skin:  Skin Assessment: Reviewed RN Assessment (R hand wound, incision abdomen, VAC)  Last BM:  7/22- 70ml  via ostomy  Height:   Ht Readings from Last 1 Encounters:  05/24/24 5' 7 (1.702 m)    Weight:   Wt Readings from Last 1 Encounters:  06/03/24 96.7 kg    Ideal Body Weight:  67.2 kg  BMI:  Body mass index is 33.39 kg/m.  Estimated Nutritional Needs:   Kcal:  1900-2200kcal/day  Protein:  95-110g/day  Fluid:  1.7-2.0L/day  Augustin Shams MS, RD, LDN If unable to be reached, please send secure chat to RD inpatient available from 8:00a-4:00p daily

## 2024-06-04 NOTE — Plan of Care (Signed)
  Problem: Education: Goal: Knowledge of General Education information will improve Description: Including pain rating scale, medication(s)/side effects and non-pharmacologic comfort measures 06/04/2024 2310 by Cathyann Ceola HERO, RN Outcome: Progressing 06/04/2024 2310 by Cathyann Ceola HERO, RN Outcome: Progressing   Problem: Clinical Measurements: Goal: Respiratory complications will improve 06/04/2024 2310 by Cathyann Ceola HERO, RN Outcome: Progressing 06/04/2024 2310 by Cathyann Ceola HERO, RN Outcome: Progressing   Problem: Clinical Measurements: Goal: Cardiovascular complication will be avoided 06/04/2024 2310 by Cathyann Ceola HERO, RN Outcome: Progressing 06/04/2024 2310 by Cathyann Ceola HERO, RN Outcome: Progressing   Problem: Nutrition: Goal: Adequate nutrition will be maintained Outcome: Progressing   Problem: Elimination: Goal: Will not experience complications related to bowel motility Outcome: Progressing   Problem: Elimination: Goal: Will not experience complications related to urinary retention Outcome: Progressing   Problem: Pain Managment: Goal: General experience of comfort will improve and/or be controlled 06/04/2024 2310 by Cathyann Ceola HERO, RN Outcome: Progressing 06/04/2024 2310 by Cathyann Ceola HERO, RN Outcome: Progressing

## 2024-06-04 NOTE — Consult Note (Signed)
 PHARMACY - TOTAL PARENTERAL NUTRITION CONSULT NOTE   Indication: Prolonged ileus  Patient Measurements: Height: 5' 7 (170.2 cm) Weight: 96.7 kg (213 lb 3 oz) IBW/kg (Calculated) : 66.1 TPN AdjBW (KG): 71.1 Body mass index is 33.39 kg/m.  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 Admitted with small bowel obstruction. Small bowel resection and appendectomy on 05/24/2024. Noted N/V and abd distention on 7/16. Revision done 7/16 in the evening. Noted prolonged ileus and pharmacy consulted to initiate and manage TPN.  Glucose / Insulin : No hx of diabetes,A1C 4.8% BG 165-200.  12 units of insulin  given in last 24 hours Electrolytes: K = 5.2>4.9 Renal: Scr < 1 (stable) Hepatic: AST/ALT and Tbili - WNL Intake / Output; MIVF:  Intake/Output Summary (Last 24 hours) at 06/04/2024 9193 Last data filed at 06/04/2024 9562 Gross per 24 hour  Intake 868.23 ml  Output 1170 ml  Net -301.77 ml    GI Imaging: GI Surgeries / Procedures: laparotomy with small bowel resection and appendectomy on 05/24/2024    Central access: 7/17 TPN start date: 7/17  Nutritional Goals: Goal TPN rate is 80 mL/hr (provides 103 g of protein and 1904 kcals per day)  RD Assessment: Estimated Needs Total Energy Estimated Needs: 1900-2200kcal/day Total Protein Estimated Needs: 95-110g/day Total Fluid Estimated Needs: 1.7-2.0L/day  Current Nutrition: FLD  Plan:  Continue TPN at 80 mL/hr at 1800 Electrolytes in TPN: Na 11mEq/L, K 50mEq/L (Will reduce to 40 mEq/L 7/22), Ca 54mEq/L, Mg 36mEq/L, and Phos 15mmol/L. Cl:Ac 1:1 Add standard MVI and trace elements to TPN Add thiamine  (day 4) and folic acid  to TPN daily until able to take PO Continue Sensitive q4h SSI and adjust as needed  Monitor TPN labs on Mon/Thurs, daily until  stable   Mose Blew, PharmD, BCPS 06/04/2024 8:06 AM

## 2024-06-04 NOTE — Plan of Care (Signed)

## 2024-06-04 NOTE — Progress Notes (Signed)
 Progress Note   Patient: Stephen [name] FMW:[id number] DOB: November 03, 1946 DOA: 05/20/2024     15 DOS: the patient was seen and examined on 06/04/2024     Brief hospital course:   From HPI Stephen [name] is a 78 y.o. 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  His abdominal pain is mainly in the upper abdomen, constant, mild to moderate, aching, nonradiating, not aggravated or alleviated by any known factors. He has nausea and multiple episodes of vomiting with coffee-ground emesis.  He still has small amount of output from colostomy.  No fever or chills.  Denies chest pain or SOB.  Patient has mild dry cough.  He uses condom catheter and reports burning on urination, no dysuria or hematuria.     Assessment & Plan:   Small bowel obstruction s/p surgery Postop ileus Repeat abdominal imaging showing persistence of SBO General Surgery took patient for exploratory laparotomy with small bowel resection and appendectomy on 05/24/2024 NG tube removed on 05/28/2024 and then reinserted on 05/30/2024 and removed on 06/03/2024 Patient experiencing postop ileus General Surgery on board and advancing diet Continue as needed pain regimen Continue TPN   # History rectal cancer # Colostomy status # New liver and lung masses With isolated met to left lung, s/p resection, adjuvant chemo. Last oncology visit in 2022, lost to f/u, wife says can't transport him to doctor appointments. Here CT of abdomen/pelvis shows left lung base nodule as well as liver nodule. MRI of liver and CT of chest consistent  with probable malignancy (metastases vs new primary) Discussed with Dr. Christa and at this point no more intervention planned Oncologist have discussed with patient as well as patient's wife.  They know this is likely recurrent cancer and since patient has been bedridden there is nothing oncology can offer.  Patient does not want a biopsy.  Wife also not ready for hospice. They have agreed that once his SBO is managed, patient will be discharged home.   # AKI # Lactic acidosis Likely prerenal 2/2 above process. Resolved   # Acute cystitis Ua suggestive of possible infection and does endorse recent dysuria. Culture growing multiple species 5 days course of antibiotics completed   # Bedbound Wife says this was last 5 years   # HTN Bp normal, doesn't appear to be on meds at home   # Alcohol use Few drinks a day, denies withdrawal history, no s/s of such here - monitor   # Macrocytosis B12 and folate wnl     DVT prophylaxis: scds Code Status: dnr/dni Family Communication: wife dora updated at bedside 7/15   Level of care: Telemetry Medical Status is: Inpatient Remains inpatient appropriate because: severity of illness       Consultants:  Gen surg   Procedures: NG tube placement Exploratory laparotomy with small bowel resection and appendectomy   Antimicrobials:  none      Subjective: Patient seen and examined at bedside this morning He denies worsening abdominal pain chest pain or cough Diet full liquid diet by general surgery yesterday   Examination:   General exam: Appears calm and comfortable  Respiratory system: Clear to auscultation. Respiratory  effort normal. Cardiovascular system: S1 & S2 heard, RRR.  Gastrointestinal system: Midline surgical incisional dressing is clean and dry, colostomy in place Central nervous system: Alert and oriented. No focal neurological deficits. Extremities: warm. Decreased power and muscle mass in LEs Skin: No rashes,  lesions or ulcers Psychiatry: Judgement and insight appear normal. Mood & affect appropriate.    Data Reviewed:  Vitals:   06/04/24 0549 06/04/24 0736 06/04/24 0743 06/04/24 1636  BP: 99/60 98/62 115/73 (!) 100/52  Pulse: 83 85 84 86  Resp: 15 16 16 16   Temp: (!) 97.5 F (36.4 C) 98.4 F (36.9 C)  97.7 F (36.5 C)  TempSrc: Oral Oral    SpO2: 95% 95% 100% 96%  Weight:      Height:          Latest Ref Rng & Units 06/04/2024    5:11 AM 06/03/2024    5:05 AM 06/01/2024    5:44 AM  CBC  WBC 4.0 - 10.5 K/uL 11.2  13.2  13.2   Hemoglobin 13.0 - 17.0 g/dL 9.7  9.7  9.9   Hematocrit 39.0 - 52.0 % 29.5  29.3  28.9   Platelets 150 - 400 K/uL 182  179  174        Latest Ref Rng & Units 06/04/2024    5:11 AM 06/03/2024    5:05 AM 06/01/2024    5:44 AM  BMP  Glucose 70 - 99 mg/dL 830  833  869   BUN 8 - 23 mg/dL 19  18  15    Creatinine 0.61 - 1.24 mg/dL 9.22  9.24  9.24   Sodium 135 - 145 mmol/L 134  134  137   Potassium 3.5 - 5.1 mmol/L 4.9  5.2  4.2   Chloride 98 - 111 mmol/L 102  100  106   CO2 22 - 32 mmol/L 26  27  27    Calcium 8.9 - 10.3 mg/dL 8.6  8.6  8.0      Disposition: Discharge home once cleared by general surgery  Author: Drue ONEIDA Potter, MD 06/04/2024 5:52 PM  For on call review [email or web address].

## 2024-06-05 DIAGNOSIS — K56609 Unspecified intestinal obstruction, unspecified as to partial versus complete obstruction: Secondary | ICD-10-CM | POA: Diagnosis not present

## 2024-06-05 LAB — BASIC METABOLIC PANEL WITH GFR
Anion gap: 5 (ref 5–15)
BUN: 22 mg/dL (ref 8–23)
CO2: 23 mmol/L (ref 22–32)
Calcium: 8.3 mg/dL — ABNORMAL LOW (ref 8.9–10.3)
Chloride: 102 mmol/L (ref 98–111)
Creatinine, Ser: 0.58 mg/dL — ABNORMAL LOW (ref 0.61–1.24)
GFR, Estimated: >60 mL/min (ref >60)
Glucose, Bld: 208 mg/dL — ABNORMAL HIGH (ref 70–99)
Potassium: 4.6 mmol/L (ref 3.5–5.1)
Sodium: 130 mmol/L — ABNORMAL LOW (ref 135–145)

## 2024-06-05 LAB — CBC WITH DIFFERENTIAL/PLATELET
Abs Immature Granulocytes: 0.21 K/uL — ABNORMAL HIGH (ref 0.00–0.07)
Basophils Absolute: 0.1 K/uL (ref 0.0–0.1)
Basophils Relative: 1 %
Eosinophils Absolute: 0.4 K/uL (ref 0.0–0.5)
Eosinophils Relative: 3 %
HCT: 25.9 % — ABNORMAL LOW (ref 39.0–52.0)
Hemoglobin: 8.8 g/dL — ABNORMAL LOW (ref 13.0–17.0)
Immature Granulocytes: 2 %
Lymphocytes Relative: 10 %
Lymphs Abs: 1.2 K/uL (ref 0.7–4.0)
MCH: 34.5 pg — ABNORMAL HIGH (ref 26.0–34.0)
MCHC: 34 g/dL (ref 30.0–36.0)
MCV: 101.6 fL — ABNORMAL HIGH (ref 80.0–100.0)
Monocytes Absolute: 1.3 K/uL — ABNORMAL HIGH (ref 0.1–1.0)
Monocytes Relative: 11 %
Neutro Abs: 8.8 K/uL — ABNORMAL HIGH (ref 1.7–7.7)
Neutrophils Relative %: 73 %
Platelets: 205 K/uL (ref 150–400)
RBC: 2.55 MIL/uL — ABNORMAL LOW (ref 4.22–5.81)
RDW: 13.9 % (ref 11.5–15.5)
WBC: 12 K/uL — ABNORMAL HIGH (ref 4.0–10.5)
nRBC: 0 % (ref 0.0–0.2)

## 2024-06-05 LAB — GLUCOSE, CAPILLARY
Glucose-Capillary: 151 mg/dL — ABNORMAL HIGH (ref 70–99)
Glucose-Capillary: 175 mg/dL — ABNORMAL HIGH (ref 70–99)
Glucose-Capillary: 183 mg/dL — ABNORMAL HIGH (ref 70–99)
Glucose-Capillary: 189 mg/dL — ABNORMAL HIGH (ref 70–99)
Glucose-Capillary: 200 mg/dL — ABNORMAL HIGH (ref 70–99)
Glucose-Capillary: 206 mg/dL — ABNORMAL HIGH (ref 70–99)
Glucose-Capillary: 220 mg/dL — ABNORMAL HIGH (ref 70–99)

## 2024-06-05 LAB — MAGNESIUM: Magnesium: 2.2 mg/dL (ref 1.7–2.4)

## 2024-06-05 MED ORDER — TRAVASOL 10 % IV SOLN
INTRAVENOUS | Status: AC
  Filled 2024-06-05: qty 1036.8

## 2024-06-05 MED ORDER — INSULIN ASPART 100 UNIT/ML IJ SOLN
0.0000 [IU] | INTRAMUSCULAR | Status: DC
  Administered 2024-06-05 – 2024-06-08 (×17): 3 [IU] via SUBCUTANEOUS
  Administered 2024-06-08 (×2): 2 [IU] via SUBCUTANEOUS
  Administered 2024-06-08 (×2): 3 [IU] via SUBCUTANEOUS
  Administered 2024-06-09: 2 [IU] via SUBCUTANEOUS
  Filled 2024-06-05 (×22): qty 1

## 2024-06-05 NOTE — Consult Note (Signed)
 PHARMACY - TOTAL PARENTERAL NUTRITION CONSULT NOTE   Indication: Prolonged ileus  Patient Measurements: Height: 5' 7 (170.2 cm) Weight: 96.7 kg (213 lb 3 oz) IBW/kg (Calculated) : 66.1 TPN AdjBW (KG): 71.1 Body mass index is 33.39 kg/m.  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 Admitted with small bowel obstruction. Small bowel resection and appendectomy on 05/24/2024. Noted N/V and abd distention on 7/16. Revision done 7/16 in the evening. Noted prolonged ileus and pharmacy consulted to initiate and manage TPN.  Glucose / Insulin : No hx of diabetes,A1C 4.8% BG 179-220.  13 units of insulin  given in last 24 hours Increase SSI to moderate scale 7/23 Electrolytes: Na 130>>increase in TPN Renal: Scr < 1 (stable) Hepatic: AST/ALT and Tbili - WNL Intake / Output; MIVF:  Intake/Output Summary (Last 24 hours) at 06/05/2024 0751 Last data filed at 06/05/2024 9668 Gross per 24 hour  Intake 497.27 ml  Output 1670 ml  Net -1172.73 ml    GI Imaging: GI Surgeries / Procedures: laparotomy with small bowel resection and appendectomy on 05/24/2024    Central access: 7/17 TPN start date: 7/17  Nutritional Goals: Goal TPN rate is 80 mL/hr (provides 103 g of protein and 1904 kcals per day)  RD Assessment: Estimated Needs Total Energy Estimated Needs: 1900-2200kcal/day Total Protein Estimated Needs: 95-110g/day Total Fluid Estimated Needs: 1.7-2.0L/day  Current Nutrition: FLD  Plan:  Continue TPN at 80 mL/hr at 1800 Electrolytes in TPN: Na 50mEq/L (Will increase to 60 mEq/L 7/23), K 76mEq/L, Ca 58mEq/L, Mg 47mEq/L, and Phos 15mmol/L. Cl:Ac 1:1 Add standard MVI and trace elements to TPN Add thiamine  and folic acid  to TPN daily until able to take PO Continue moderate q4h SSI and adjust as needed   Monitor TPN labs on Mon/Thurs, daily until stable   Mose Blew, PharmD, BCPS 06/05/2024 7:51 AM

## 2024-06-05 NOTE — Progress Notes (Signed)
 Patients BP 85/49 map 61. Patient otherwise asymptomatic. MD made aware, advised to continue to monitor.

## 2024-06-05 NOTE — Progress Notes (Signed)
 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  He was admitted for small bowel obstruction.  SBO was persistent despite conservative management.  General surgery took the patient for exploratory laparotomy on 7/11.  He underwent small bowel resection and appendectomy. Postoperative course has been further complicated by ileus.  He went back to the ER on 7/16 for reopening of recent laparotomy and closure of abdominal wall.  Patient has been on TPN.  CT scan also reveals hepatic mass which was thought to be malignancy, metastasis versus primary.  Oncology was consulted and has discussed this with the patient and his wife.  They are both aware that this is likely recurrent cancer but since the patient is bedbound at baseline and thus has poor functional status, there are no cancer therapies available to him at this time.  Patient is not interested in pursuing a biopsy of this mass.  Patient's wife reports she is not ready for hospice.  Currently they are planning to manage his SBO and discharged home when he is surgically cleared.  Subjective: No acute events overnight.  Patient has no complaints presently.  Reports his pain is well-controlled with medication   Objective: Vitals:   06/04/24 0743 06/04/24 1636 06/04/24 2012 06/05/24 0501  BP: 115/73 (!) 100/52 111/61 103/62  Pulse: 84 86 86 89  Resp: 16 16 17 16   Temp:  97.7 F (36.5 C) 98.2 F (36.8 C) 98.5 F (36.9 C)  TempSrc:   Oral Oral  SpO2: 100% 96% 95% 93%  Weight:      Height:         Intake/Output Summary (Last 24 hours) at 06/05/2024 1458 Last data filed at 06/05/2024 1300 Gross per 24 hour  Intake 477.27 ml  Output 1720 ml  Net -1242.73 ml   Filed Weights   05/30/24 0306 06/02/24 0242 06/03/24 0423  Weight: 97 kg 99.8 kg 96.7 kg    Examination: General exam: Appears calm and comfortable, NAD  Respiratory system: No work of breathing, symmetric chest wall expansion Cardiovascular system: S1 & S2 heard, RRR.  Gastrointestinal system: appropriately tender to palpation.  Colostomy in place, wound VAC in place. Neuro: Alert and oriented. No focal neurological deficits. Extremities: Symmetric, expected ROM Skin: No rashes, lesions Psychiatry: Demonstrates appropriate judgement and insight. Mood & affect appropriate for situation.   Assessment & Plan:  Principal Problem:   SBO (small bowel obstruction) (HCC) Active Problems:   UTI (urinary tract infection)   AKI (acute kidney injury) (HCC)   HTN (hypertension)   Chronic diastolic CHF (congestive heart failure) (HCC)   Rectal cancer (HCC)   Liver lesion   Coffee ground emesis   Alcohol use   Overweight (BMI 25.0-29.9)   Malnutrition of moderate degree    SBO status postsurgical intervention Postop ileus - 7/11: Ex lap with small bowel resection and appendectomy - 7/16: Abdominal wall dehiscence, abdominal closure, wound VAC placement - NG tube removed 7/15, reinserted 7/17, removed 7/21 - Continue with TPN, diet advancement per general surgery.  Liquid diet for now. - Wound VAC in place, next change will be  Friday 7/25  History of rectal cancer Colostomy status New liver and lung masses - Isolated mets to left lung, status postresection and adjuvant chemo.  Last oncology visit in 2022 lost to follow-up.  Patient's wife reports she can no longer transport him to doctor appointments - On this admission CT abdomen pelvis: Left lung base nodule as well as liver nodule.  MRI of liver and CT chest  consistent with probable malignancy, metastasis versus new primary - Dr. Jacobo has been consulted and reviewed.  Reports no acute intervention at this time - Oncology has also discussed directly with patient and his wife.  They are aware that due to his poor functional status oncology cannot offer treatment - Patient's wife is currently declining hospice - Plan will be to discharge home once SBO is managed. - Palliative care consulted  Hyponatremia  AKI Lactic acidosis - Likely prerenal secondary to the above - Resolved now  Acute cystitis - Status post 5 days antibiotic course.  Culture with multiple species growth  Bedbound - At baseline times last 5 years  Hypertension - Stable without meds currently.  Monitor closely and titrate as needed  Alcohol use disorder - Denies withdrawal history.  No signs or symptoms of withdrawal during this admission  Macrocytic anemia - B12 and folate within normal limits - TPN with supplementation as above.  DVT prophylaxis: lovenox    Code Status: Limited: Do not attempt resuscitation (DNR) -DNR-LIMITED -Do Not Intubate/DNI  Disposition: Inpatient.  Will eventually discharge home when surgically cleared.  Consultants:  Treatment Team:  Consulting Physician: Rodolph Romano, MD  Procedures:    Antimicrobials:  Anti-infectives (From admission, onward)    Start     Dose/Rate Route Frequency Ordered Stop   05/24/24 1015  ceFAZolin  (ANCEF ) IVPB 2g/100 mL premix  Status:  Discontinued        2 g 200 mL/hr over 30 Minutes Intravenous  Once 05/24/24 0921 05/27/24 0738   05/23/24 1300  cefTRIAXone  (ROCEPHIN ) 1 g in sodium chloride  0.9 % 100 mL IVPB        1 g 200 mL/hr over 30 Minutes Intravenous Every 24 hours 05/23/24 1208 05/29/24 1235   05/21/24 1800  cefTRIAXone  (ROCEPHIN ) 1 g in sodium chloride  0.9 % 100 mL IVPB        1 g 200 mL/hr over 30 Minutes Intravenous  Once 05/21/24 1049 05/21/24 1929   05/20/24 1800  cefTRIAXone   (ROCEPHIN ) 2 g in sodium chloride  0.9 % 100 mL IVPB        2 g 200 mL/hr over 30 Minutes Intravenous  Once 05/20/24 1750 05/20/24 2000       Data Reviewed: I have personally reviewed following labs and imaging studies CBC: Recent Labs  Lab 05/31/24 0606 06/01/24 0544 06/03/24 0505 06/04/24 0511 06/05/24 0505  WBC 15.7* 13.2* 13.2* 11.2* 12.0*  NEUTROABS 13.1* 9.7* 10.2* 8.1* 8.8*  HGB 10.0* 9.9* 9.7* 9.7* 8.8*  HCT 29.2* 28.9* 29.3* 29.5* 25.9*  MCV 102.1* 102.5* 105.4* 104.6* 101.6*  PLT 187 174 179 182 205   Basic Metabolic Panel: Recent Labs  Lab 05/30/24 0515 05/31/24 0606 06/01/24 0544 06/02/24 0539 06/03/24 0505 06/04/24 0511 06/05/24 0505  NA 133* 138 137  --  134* 134* 130*  K 3.9 3.9 4.2  --  5.2* 4.9 4.6  CL 101 104 106  --  100 102 102  CO2 23 26 27   --  27 26 23   GLUCOSE 162* 125* 130*  --  166* 169* 208*  BUN 11 14 15   --  18 19 22   CREATININE 0.89 0.75 0.75  --  0.75 0.77 0.58*  CALCIUM 7.8* 8.1* 8.0*  --  8.6* 8.6* 8.3*  MG 1.9 2.0 2.1 2.1 2.2 2.2 2.2  PHOS 2.7 1.6* 2.5 2.7 3.2  --   --    GFR: Estimated Creatinine Clearance: 85.6 mL/min (A) (by C-G formula based on SCr of 0.58 mg/dL (L)). Liver Function Tests: Recent Labs  Lab 05/30/24 0515 05/31/24 0606 06/03/24 0505  AST 36 29 34  ALT 18 12 16   ALKPHOS 118 91 101  BILITOT 0.8 0.5 0.3  PROT 5.9* 5.1* 5.6*  ALBUMIN 2.2* 2.0* 2.2*   CBG: Recent Labs  Lab 06/04/24 2011 06/05/24 0014 06/05/24 0502 06/05/24 0856 06/05/24 1146  GLUCAP 198* 220* 200* 206* 183*    No results found for this or any previous visit (from the past 240 hours).   Radiology Studies: No results found.  Scheduled Meds:  vitamin C   500 mg Oral BID   Chlorhexidine  Gluconate Cloth  6 each Topical Daily   enoxaparin  (LOVENOX ) injection  40 mg Subcutaneous Q24H   feeding supplement  237 mL Oral TID BM   insulin  aspart  0-15 Units Subcutaneous Q4H   ketorolac   1 drop Right Eye QID   pantoprazole  (PROTONIX ) IV   40 mg Intravenous Q12H   polyethylene glycol  17 g Oral Daily   pyridOXINE   100 mg Intravenous Daily   sodium chloride  flush  10-40 mL Intracatheter Q12H   Continuous Infusions:  TPN ADULT (ION) 80 mL/hr at 06/04/24 1659   TPN ADULT (ION)       LOS: 16 days  MDM: Patient is high risk for one or more organ failure.  They necessitate ongoing hospitalization for continued IV therapies and subsequent lab monitoring. Total time spent interpreting labs and vitals, reviewing the medical record, coordinating care amongst consultants and care team members, directly assessing and discussing care with the patient and/or family: 55 min  Avanti Jetter, DO [facility]  To contact the attending physician between 7A-7P please use Epic Chat. To contact the covering physician during after hours 7P-7A, please review Amion.  06/05/2024, 2:58 PM   *This document has been created with the assistance of dictation software. Please excuse typographical errors. *

## 2024-06-05 NOTE — Progress Notes (Addendum)
 [facility]- General Surgery  SURGICAL PROGRESS NOTE  Hospital Day(s): 16.   Post op day(s):  12 Days Post-op: exploratory laparotomy with small bowel resection and appendectomy  7 Days Post-op: re-opening of recent laparotomy with closure of abdominal wall  Interval History:  Patient seen and examined. Patient is experiencing generalized abdominal discomfort. States he was feeling nauseous and was given an antiemetic medication. Patient is on liquid diet but wife at bedside states he hasn't been really eating/drinking anything because he doesn't like it including ensures. Gas present in colostomy bag but low output.    Vital signs in last 24 hours: [min-max] current  Temp:  [97.7 F (36.5 C)-98.5 F (36.9 C)] 98.5 F (36.9 C) (07/23 0501) Pulse Rate:  [86-89] 89 (07/23 0501) Resp:  [16-17] 16 (07/23 0501) BP: (100-111)/(52-62) 103/62 (07/23 0501) SpO2:  [93 %-96 %] 93 % (07/23 0501)     Height: 5' 7 (170.2 cm) Weight: 96.7 kg BMI (Calculated): 33.38   Intake/Output last 2 shifts:  07/22 0701 - 07/23 0700 In: 497.3 [P.O.:140; I.V.:357.3] Out: 1670 [Urine:1500; Drains:100; Stool:70]   Physical Exam:  Constitutional: alert, cooperative and no distress  Respiratory: breathing non-labored at rest  Cardiovascular: regular rate and sinus rhythm  Gastrointestinal: soft, mildly tender, and mildly distended, wound vac intact, and low colostomy output  Labs:     Latest Ref Rng & Units 06/05/2024    5:05 AM 06/04/2024    5:11 AM 06/03/2024    5:05 AM  CBC  WBC 4.0 - 10.5 K/uL 12.0  11.2  13.2   Hemoglobin 13.0 - 17.0 g/dL 8.8  9.7  9.7   Hematocrit 39.0 - 52.0 % 25.9  29.5  29.3   Platelets 150 - 400 K/uL 205  182  179       Latest Ref Rng & Units 06/05/2024    5:05 AM 06/04/2024    5:11 AM 06/03/2024    5:05 AM  CMP  Glucose 70 - 99 mg/dL 791  830  833   BUN 8 - 23 mg/dL 22  19  18    Creatinine 0.61 - 1.24 mg/dL 9.41  9.22  9.24   Sodium 135 - 145 mmol/L 130  134  134    Potassium 3.5 - 5.1 mmol/L 4.6  4.9  5.2   Chloride 98 - 111 mmol/L 102  102  100   CO2 22 - 32 mmol/L 23  26  27    Calcium 8.9 - 10.3 mg/dL 8.3  8.6  8.6   Total Protein 6.5 - 8.1 g/dL   5.6   Total Bilirubin 0.0 - 1.2 mg/dL   0.3   Alkaline Phos 38 - 126 U/L   101   AST 15 - 41 U/L   34   ALT 0 - 44 U/L   16      Imaging studies: No new pertinent imaging studies  Procedure: Changed previous wound vac. Cleansed with Vashe wound solution. A negative pressure dressing (DME) was placed in steps, covering a 20 cm x 2 cm (40 cm area). Wound healing as expected. No signs of infection noted.  I personally changed wound vac with Dr. Polly supervision.   Assessment/Plan:  78 y.o. male with small bowel obstruction 12 Days Post-op s/p exploratory laparotomy with small bowel resection and appendectomy, and 7 Days Post-Op s/p revision and closure of abdominal wall due to dehiscence and evisceration of omental fat complicated by pertinent comorbidities including HFpEF, alcohol use, metastatic rectal cancer with  Foley and colostomy, HTN, AKI, and UTI.    - Stable vital signs    - Increase in leukocytosis 11.2 >> 12.0   - No change in diet. Continue liquid diet. Difficult to assess patient and determine progress overall. Unsure if patient is not tolerating liquid diet since according to wife, he is not wanting to eat/drink anything  - With patient feeling nauseous and having slow GI function, will continue to manage conservatively   - Continue TPN  - Plan for dressing change today    - Continue pain management and DVT prophylaxis    -- Cody Albus Barrientos PA-C

## 2024-06-06 ENCOUNTER — Inpatient Hospital Stay

## 2024-06-06 DIAGNOSIS — K56609 Unspecified intestinal obstruction, unspecified as to partial versus complete obstruction: Secondary | ICD-10-CM | POA: Diagnosis not present

## 2024-06-06 LAB — COMPREHENSIVE METABOLIC PANEL WITH GFR
ALT: 59 U/L — ABNORMAL HIGH (ref 0–44)
AST: 84 U/L — ABNORMAL HIGH (ref 15–41)
Albumin: 1.9 g/dL — ABNORMAL LOW (ref 3.5–5.0)
Alkaline Phosphatase: 158 U/L — ABNORMAL HIGH (ref 38–126)
Anion gap: 6 (ref 5–15)
BUN: 26 mg/dL — ABNORMAL HIGH (ref 8–23)
CO2: 24 mmol/L (ref 22–32)
Calcium: 8.3 mg/dL — ABNORMAL LOW (ref 8.9–10.3)
Chloride: 104 mmol/L (ref 98–111)
Creatinine, Ser: 0.7 mg/dL (ref 0.61–1.24)
GFR, Estimated: >60 mL/min (ref >60)
Glucose, Bld: 192 mg/dL — ABNORMAL HIGH (ref 70–99)
Potassium: 4.5 mmol/L (ref 3.5–5.1)
Sodium: 134 mmol/L — ABNORMAL LOW (ref 135–145)
Total Bilirubin: 0.7 mg/dL (ref 0.0–1.2)
Total Protein: 5.5 g/dL — ABNORMAL LOW (ref 6.5–8.1)

## 2024-06-06 LAB — GLUCOSE, CAPILLARY
Glucose-Capillary: 163 mg/dL — ABNORMAL HIGH (ref 70–99)
Glucose-Capillary: 165 mg/dL — ABNORMAL HIGH (ref 70–99)
Glucose-Capillary: 174 mg/dL — ABNORMAL HIGH (ref 70–99)
Glucose-Capillary: 179 mg/dL — ABNORMAL HIGH (ref 70–99)
Glucose-Capillary: 181 mg/dL — ABNORMAL HIGH (ref 70–99)
Glucose-Capillary: 187 mg/dL — ABNORMAL HIGH (ref 70–99)

## 2024-06-06 LAB — MAGNESIUM: Magnesium: 2.1 mg/dL (ref 1.7–2.4)

## 2024-06-06 LAB — PHOSPHORUS: Phosphorus: 3.1 mg/dL (ref 2.5–4.6)

## 2024-06-06 MED ORDER — TRAVASOL 10 % IV SOLN
INTRAVENOUS | Status: AC
  Filled 2024-06-06: qty 1094.4

## 2024-06-06 MED ORDER — IOHEXOL 9 MG/ML PO SOLN
500.0000 mL | ORAL | Status: AC
  Administered 2024-06-06 (×2): 500 mL via ORAL

## 2024-06-06 MED ORDER — IOHEXOL 300 MG/ML  SOLN
100.0000 mL | Freq: Once | INTRAMUSCULAR | Status: AC | PRN
  Administered 2024-06-06: 100 mL via INTRAVENOUS

## 2024-06-06 MED ORDER — METOCLOPRAMIDE HCL 5 MG PO TABS
5.0000 mg | ORAL_TABLET | Freq: Three times a day (TID) | ORAL | Status: DC | PRN
  Filled 2024-06-06: qty 1

## 2024-06-06 NOTE — Progress Notes (Signed)
 [facility]- General Surgery  SURGICAL PROGRESS NOTE  Hospital Day(s): 17.   Post op day(s): 8 Days Post-Op.   Interval History:  Patient seen and examined. No acute events or new complaints overnight. Wife reports he has been drinking the Ensures but not much intake. Gas still present in colostomy bag with low output. Denies any nausea or vomiting.    Vital signs in last 24 hours: [min-max] current  Temp:  [98.2 F (36.8 C)-98.5 F (36.9 C)] 98.5 F (36.9 C) (07/24 0351) Pulse Rate:  [85-93] 85 (07/24 0351) Resp:  [16-18] 16 (07/24 0351) BP: (81-99)/(49-62) 99/62 (07/24 0351) SpO2:  [94 %-99 %] 99 % (07/24 0351) Weight:  [96.6 kg] 96.6 kg (07/24 0407)     Height: 5' 7 (170.2 cm) Weight: 96.6 kg BMI (Calculated): 33.35   Intake/Output last 2 shifts:  07/23 0701 - 07/24 0700 In: 1604 [P.O.:150; I.V.:1454] Out: 2000 [Urine:1900; Drains:100]   Physical Exam:  Constitutional: alert, cooperative and no distress  Respiratory: breathing non-labored at rest  Cardiovascular: regular rate and sinus rhythm  Gastrointestinal: soft, mildly tender, and no change, still distended, wound vac intact, gas in colostomy bag    Labs:     Latest Ref Rng & Units 06/05/2024    5:05 AM 06/04/2024    5:11 AM 06/03/2024    5:05 AM  CBC  WBC 4.0 - 10.5 K/uL 12.0  11.2  13.2   Hemoglobin 13.0 - 17.0 g/dL 8.8  9.7  9.7   Hematocrit 39.0 - 52.0 % 25.9  29.5  29.3   Platelets 150 - 400 K/uL 205  182  179       Latest Ref Rng & Units 06/06/2024    3:56 AM 06/05/2024    5:05 AM 06/04/2024    5:11 AM  CMP  Glucose 70 - 99 mg/dL 807  791  830   BUN 8 - 23 mg/dL 26  22  19    Creatinine 0.61 - 1.24 mg/dL 9.29  9.41  9.22   Sodium 135 - 145 mmol/L 134  130  134   Potassium 3.5 - 5.1 mmol/L 4.5  4.6  4.9   Chloride 98 - 111 mmol/L 104  102  102   CO2 22 - 32 mmol/L 24  23  26    Calcium 8.9 - 10.3 mg/dL 8.3  8.3  8.6   Total Protein 6.5 - 8.1 g/dL 5.5     Total Bilirubin 0.0 - 1.2 mg/dL 0.7      Alkaline Phos 38 - 126 U/L 158     AST 15 - 41 U/L 84     ALT 0 - 44 U/L 59       Imaging studies:  Awaiting on CT full report. Images show fluid collection near anastomosis   Assessment/Plan:  78 y.o. 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 Still awaiting on CT report. Images shows possible fluid collection near anastomosis. Discussed results with IR suspicious for hematoma. Plan to drain today or tomorrow by IR  - NPO for procedure    - Continue TPN  -  Plan for next wound vac change tomorrow  - Continue pain management and DVT prophylaxis    -- Jasmia Angst Barrientos PA-C

## 2024-06-06 NOTE — Plan of Care (Signed)

## 2024-06-06 NOTE — Consult Note (Signed)
 Chief Complaint: Post operative pelvic fluid collection - IR consulted for image guided pelvic drain placement  Referring Provider(s): Rodolph Romano, MD   Supervising Physician: Luverne Aran  Patient Status: [facility] - In-pt  History of Present Illness: Stephen [name] is a 78 y.o. 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 Currently in the hospital after presenting with abdominal pain, abdominal distention and emesis. Found to have small bowel obstruction. Had initial exploratory laparotomy for this, due to dense adhesions from prior surgeries, on 05/24/24. He then developed dehiscence of his abdominal wall due to weak abdominal fascia and had repeat surgery to correct this on 05/29/24. Repeat CT scan today, 06/06/24 shows a pelvic fluid collection near the bladder with surgery consulting our service for image guided drain placement.   Case and imaging reviewed with IR attending Dr. Luverne with approval to proceed.  Pt today endorsees no pain, just some mild fatigue and decreased appetite.  Plan for procedure tomorrow, 06/07/24. Pt to be NPO at midnight. AM Lovenox  held tomorrow.  DNR/DNI: If pulseless and not breathing No CPR or chest compressions.  In Pre-Arrest Conditions (Patient Is Breathing and Has A Pulse) Do not intubate. Provide all appropriate non-invasive medical interventions. Avoid ICU transfer unless indicated or required   Patient currently has DNR order in place. Discussion with the patient and family regarding wishes.  The original DNR order is maintained during the procedure and prior treatment limitations are upheld during the procedure.   Past Medical History:  Diagnosis Date   Allergic rhinitis    Blood clot in vein 2015   left leg    History of anemia    History of chicken pox    HTN (hypertension)    Neuropathy    arms and legs - S/P Cancer tx   Rectal cancer  metastasized to lung [facility]) 09-2013   surgery and chemo   Smoker     Past Surgical History:  Procedure Laterality Date   APPENDECTOMY N/A 05/24/2024   Procedure: APPENDECTOMY;  Surgeon: Rodolph Romano, MD;  Location: [facility] ORS;  Service: General;  Laterality: N/A;   BOWEL RESECTION N/A 05/24/2024   Procedure: EXCISION, SMALL INTESTINE;  Surgeon: Rodolph Romano, MD;  Location: [facility] ORS;  Service: General;  Laterality: N/A;   COLON RESECTION  2015   COLONOSCOPY  2015   ENDOSCOPIC RETROGRADE CHOLANGIOPANCREATOGRAPHY (ERCP) WITH PROPOFOL  N/A 11/10/2022   Procedure: ENDOSCOPIC RETROGRADE CHOLANGIOPANCREATOGRAPHY (ERCP) WITH PROPOFOL ;  Surgeon: Abran Norleen SAILOR, MD;  Location: [facility] ENDOSCOPY;  Service: Gastroenterology;  Laterality: N/A;   ERCP N/A 11/13/2022   Procedure: ENDOSCOPIC RETROGRADE CHOLANGIOPANCREATOGRAPHY (ERCP);  Surgeon: Abran Norleen SAILOR, MD;  Location: [facility] ENDOSCOPY;  Service: Gastroenterology;  Laterality: N/A;   ESOPHAGOGASTRODUODENOSCOPY N/A 11/13/2022   Procedure: ESOPHAGOGASTRODUODENOSCOPY (EGD);  Surgeon: Abran Norleen SAILOR, MD;  Location: [facility] ENDOSCOPY;  Service: Gastroenterology;  Laterality: N/A;   EUS N/A 11/15/2013   Procedure: LOWER ENDOSCOPIC ULTRASOUND (EUS);  Surgeon: Toribio SHAUNNA Cedar, MD;  Location: [facility];  Service: Endoscopy;  Laterality: N/A;   LAPAROTOMY N/A 05/24/2024   Procedure: LAPAROTOMY, EXPLORATORY;  Surgeon: Rodolph Romano, MD;  Location: [facility] ORS;  Service: General;  Laterality: N/A;   LAPAROTOMY N/A 05/29/2024   Procedure: LAPAROTOMY, EXPLORATORY;  Surgeon: Rodolph Romano, MD;  Location: [facility] ORS;  Service: General;  Laterality: N/A;   LUNG BIOPSY     LUNG LOBECTOMY Left    lung removed  2015   left lower  LYSIS OF ADHESION N/A 05/24/2024   Procedure: LAPAROTOMY, FOR LYSIS OF ADHESIONS;  Surgeon: Rodolph Romano, MD;  Location: [facility] ORS;  Service: General;  Laterality: N/A;   PLANTAR FASCIA RELEASE Right 09/20/2017   Procedure:  PLANTAR FASCIA [id number], excision plantar fibroma right foot;  Surgeon: Ashley Soulier, DPM;  Location: [facility]GERY [facility];  Service: Podiatry;  Laterality: Right;   REMOVAL OF STONES  11/13/2022   Procedure: REMOVAL OF STONES;  Surgeon: Abran Norleen SAILOR, MD;  Location: [facility] ENDOSCOPY;  Service: Gastroenterology;;   ANNETT  11/13/2022   Procedure: ANNETT;  Surgeon: Abran Norleen SAILOR, MD;  Location: [facility] ENDOSCOPY;  Service: Gastroenterology;;   TONSILLECTOMY AND ADENOIDECTOMY  Childhood   VARICOSE VEIN SURGERY  2010    Allergies: Patient has no known allergies.  Medications: Prior to Admission medications   Medication Sig Start Date End Date Taking? Authorizing Provider  esomeprazole  (NEXIUM ) 20 MG packet Take 20 mg by mouth daily before breakfast.   Yes [provider]  hydrOXYzine (ATARAX) 10 MG tablet Take 10 mg by mouth 3 (three) times daily as needed for itching (allergies). 10/20/22  Yes [provider]  MAGNESIUM  PO Take 1 tablet by mouth daily.   Yes [provider]  Multiple Vitamin (MULTIVITAMIN WITH MINERALS) TABS tablet Take 1 tablet by mouth daily. 02/25/21  Yes Verdene Purchase, MD  Omega-3 Fatty Acids (OMEGA 3 PO) Take 1 tablet by mouth daily.   Yes [provider]  polyethylene glycol (MIRALAX  / GLYCOLAX ) 17 g packet Take 17 g by mouth daily as needed for moderate constipation.   Yes [provider]  vitamin B-12 (CYANOCOBALAMIN ) 100 MCG tablet Take 100 mcg by mouth daily.   Yes [provider]  vitamin C  (ASCORBIC ACID ) 250 MG tablet Take 250 mg by mouth daily.   Yes [provider]  Vitamin D , Cholecalciferol , 25 MCG (1000 UT) TABS Take 1,000 Units by mouth daily.    [provider]     Family History  Problem Relation Age of Onset   Hypertension Mother    Coronary artery disease Mother        angina   Cancer Mother        breast   Cancer Father 74       esophageal   COPD Brother         emphysema   Cancer Paternal Grandmother        colon   Diabetes Neg Hx     Social History   Socioeconomic History   Marital status: Married    Spouse name: Not on file   Number of children: Not on file   Years of education: Not on file   Highest education level: Not on file  Occupational History   Occupation: retired  Tobacco Use   Smoking status: Former    Current packs/day: 0.00    Average packs/day: 0.5 packs/day for 15.0 years (7.5 ttl pk-yrs)    Types: Cigarettes    Start date: 11/14/2005    Quit date: 11/14/2020    Years since quitting: 3.5   Smokeless tobacco: Never  Vaping Use   Vaping status: Never Used  Substance and Sexual Activity   Alcohol use: Yes    Alcohol/week: 18.0 standard drinks of alcohol    Types: 18 Shots of liquor per week    Comment: Regular 2-3 scotch/day   Drug use: No   Sexual activity: Not on file  Other Topics Concern   Not on file  Social History Narrative   Caffeine: 5-6 cups coffee/day   Lives with wife Synthia), son and step son, no pets   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)   Edu: Brewing technologist   Activity: works outside   Diet: good water, daily fruits/vegetables   Social Drivers of Corporate investment banker Strain: Not on file  Food Insecurity: No Food Insecurity (05/21/2024)   Hunger Vital Sign    Worried About Running Out of Food in the Last Year: Never true    Ran Out of Food in the Last Year: Never true  Transportation Needs: No Transportation Needs (05/21/2024)   PRAPARE - Administrator, Civil Service (Medical): No    Lack of Transportation (Non-Medical): No  Physical Activity: Not on file  Stress: Not on file  Social Connections: Moderately Integrated (05/21/2024)   Social Connection and Isolation Panel    Frequency of Communication with Friends and Family: Three times a week    Frequency of Social Gatherings with Friends and Family: Three times a week    Attends Religious  Services: 1 to 4 times per year    Active Member of Clubs or Organizations: No    Attends Banker Meetings: Never    Marital Status: Married     Review of Systems: A 12 point ROS discussed and pertinent positives are indicated in the HPI above.  All other systems are negative.  Review of Systems  Constitutional:  Positive for appetite change and fatigue.    Vital Signs: BP 99/64 (BP Location: Left Arm)   Pulse 94   Temp 98.4 F (36.9 C) (Oral)   Resp 16   Ht 5' 7 (1.702 m)   Wt 212 lb 15.4 oz (96.6 kg)   SpO2 95%   BMI 33.35 kg/m   Advance Care Plan: No documents on file  Physical Exam Vitals and nursing note reviewed.  Constitutional:      General: He is not in acute distress. HENT:     Mouth/Throat:     Mouth: Mucous membranes are moist.     Pharynx: Oropharynx is clear.  Cardiovascular:     Rate and Rhythm: Normal rate and regular rhythm.  Pulmonary:     Effort: Pulmonary effort is normal.     Breath sounds: Normal breath sounds.  Abdominal:     Palpations: Abdomen is soft.     Tenderness: There is no abdominal tenderness.     Comments: Midline surgical scar from recent laparotomy. LLQ ostomy noted. Endorses no ttp.   Musculoskeletal:     Right lower leg: No edema.     Left lower leg: No edema.  Skin:    General: Skin is warm and dry.  Neurological:     Mental Status: He is alert and oriented to person, place, and time. Mental status is at baseline.     Imaging: DG Abd Portable 1V-Small Bowel Obstruction Protocol-initial, 8 hr delay Result Date: 06/01/2024 EXAM: 1 VIEW XRAY OF THE ABDOMEN 06/01/2024 11:02:15 PM COMPARISON: None available. CLINICAL HISTORY: SMALL BOWEL OBSTRUCTION; 8 HOUR DELAY; EXAM DELAYED DUE TO PATIENT DIRTY, COLOSTOMY BAG LEAKED AND HAD TO BE CLEANED UP FIRST FINDINGS: BOWEL: Enteric tube in the mid gastric body. Contrast opacifies the ascending colon to the sigmoid, favoring a nonobstructive appearance. SOFT TISSUES: No  abnormal calcifications or opaque urinary calculi. BONES: No acute osseous abnormality. IMPRESSION: 1. Contrast opacifies the ascending colon to the sigmoid, favoring a nonobstructive appearance. Electronically signed  by: Pinkie Pebbles MD 06/01/2024 11:47 PM EDT RP Workstation: [id number]   DG Abd 1 View Result Date: 05/30/2024 CLINICAL DATA:  Check gastric catheter placement EXAM: ABDOMEN - 1 VIEW COMPARISON:  Film from earlier in the same day. FINDINGS: Gastric catheter has been advanced and now lies deeper into the stomach in satisfactory position. No free air is noted. IMPRESSION: Gastric catheter in satisfactory position. Electronically Signed   By: Oneil Devonshire M.D.   On: 05/30/2024 02:52   DG Abd 1 View Result Date: 05/30/2024 CLINICAL DATA:  Check gastric catheter placement EXAM: ABDOMEN - 1 VIEW COMPARISON:  None Available. FINDINGS: Gastric catheter is noted with the tip in the stomach. Proximal side port lies in the distal esophagus. This should be advanced deeper into the stomach. IMPRESSION: Gastric catheter as described. This should be advanced deeper into the stomach. Electronically Signed   By: Oneil Devonshire M.D.   On: 05/30/2024 01:51   US  EKG SITE RITE Result Date: 05/30/2024 If Site Rite image not attached, placement could not be confirmed due to current cardiac rhythm.  DG Abd Portable 1V-Small Bowel Obstruction Protocol-initial, 8 hr delay Result Date: 05/25/2024 CLINICAL DATA:  Small-bowel obstruction. 8 hour post administration imaging EXAM: PORTABLE ABDOMEN - 1 VIEW COMPARISON:  05/24/2024 FINDINGS: Administered oral contrast opacifies several loops of nondilated small bowel, but also extends into the nondistended transverse and proximal descending colon. No free intraperitoneal gas. Laparotomy skin staples again noted. Extensive vascular calcification noted. Ostomy appliance noted within the left lower quadrant. No acute bone abnormality. IMPRESSION: 1. Nonobstructive bowel gas  pattern. Electronically Signed   By: Dorethia Molt M.D.   On: 05/25/2024 21:46   DG Abd 1 View Result Date: 05/24/2024 CLINICAL DATA:  Check gastric catheter placement EXAM: ABDOMEN - 1 VIEW COMPARISON:  Film from earlier in the same day. FINDINGS: Gastric catheter has been inserted with the tip in the stomach. Proximal side port lies in the distal esophagus. This could be advanced deeper into the stomach. IMPRESSION: Gastric catheter as described. This could be advanced deeper into the stomach for optimal positioning. Electronically Signed   By: Oneil Devonshire M.D.   On: 05/24/2024 22:04   DG Abd 1 View Result Date: 05/24/2024 CLINICAL DATA:  Nasogastric tube placement. EXAM: ABDOMEN - 1 VIEW COMPARISON:  None Available. FINDINGS: Nasogastric tube is not visualized. Dilated small bowel loops are noted concerning for distal small bowel obstruction. No definite colonic dilatation is noted. IMPRESSION: Nasogastric tube not visualized. Small bowel dilatation is noted concerning for distal small bowel obstruction. Electronically Signed   By: Lynwood Landy Raddle M.D.   On: 05/24/2024 13:51   DG Abd 1 View Result Date: 05/24/2024 CLINICAL DATA:  Nasogastric tube placement. EXAM: ABDOMEN - 1 VIEW COMPARISON:  Same day. FINDINGS: Nasogastric tube is not visualized in the esophagus or upper abdomen. IMPRESSION: Nasogastric tube is not visualized in the esophagus or upper abdomen. Electronically Signed   By: Lynwood Landy Raddle M.D.   On: 05/24/2024 11:13   DG Abd 2 Views Result Date: 05/24/2024 CLINICAL DATA:  Small bowel obstruction EXAM: ABDOMEN - 2 VIEW COMPARISON:  Prior abdominal radiograph 05/21/2024 FINDINGS: Numerous loops of dilated and air-filled small bowel are present throughout the abdomen. Dilation measures up to 4.5 cm. No significant colonic distension. Findings are consistent with recurrent small bowel obstruction. Extensive chronic bronchitic changes again noted. No evidence of large free air.  IMPRESSION: Recurrent high-grade small-bowel obstruction. Electronically Signed   By: Wilkie  Karalee M.D.   On: 05/24/2024 08:13   MR ABDOMEN W WO CONTRAST Result Date: 05/23/2024 CLINICAL DATA:  History of rectal cancer with lung metastases. New right hepatic lobe lesion. EXAM: MRI ABDOMEN WITHOUT AND WITH CONTRAST TECHNIQUE: Multiplanar multisequence MR imaging of the abdomen was performed both before and after the administration of intravenous contrast. CONTRAST:  8mL GADAVIST  GADOBUTROL  1 MMOL/ML IV SOLN COMPARISON:  CT scan 05/20/2024 FINDINGS: Lower chest: Minimal atelectasis at the right base. Hepatobiliary: Ill-defined T1 hypointense, T2 hyperintense lesion in the right hepatic lobe shows peripheral rim enhancement measuring approximately 1.9 x 1.9 cm. 4 mm T2 hyperintensity is identified in segment IV and a 2-3 mm T2 hyperintensity is identified in segment II. Neither lesion shows restricted diffusion or evidence of enhancement after IV contrast administration. Both were present on CT scan of 11/13/2022 and are compatible with tiny hepatic cysts. Multiple gallstones identified measuring up to at least 16 mm in size. Gas is identified in the lumen of the gallbladder, similar to prior CT in suggesting prior sphincterotomy. No intrahepatic or extrahepatic biliary dilation. Pancreas: No focal mass lesion. No dilatation of the main duct. No intraparenchymal cyst. No peripancreatic edema. Spleen:  No splenomegaly. No suspicious focal mass lesion. Adrenals/Urinary Tract: No adrenal nodule or mass. Simple cyst noted right kidney. Left kidney unremarkable. Stomach/Bowel: Stomach is unremarkable. No gastric wall thickening. No evidence of outlet obstruction. Duodenum is normally positioned as is the ligament of Treitz. Mild gaseous small bowel dilatation noted within the visualized upper abdomen Vascular/Lymphatic: Atherosclerotic disease noted in the abdominal aorta without aneurysm. There is no gastrohepatic  or hepatoduodenal ligament lymphadenopathy. No retroperitoneal or mesenteric lymphadenopathy. Other:  No intraperitoneal free fluid. Musculoskeletal: No focal suspicious marrow enhancement within the visualized bony anatomy. IMPRESSION: 1. 1.9 x 1.9 cm ill-defined T1 hypointense, T2 hyperintense lesion in the right hepatic lobe shows peripheral rim enhancement. Given findings in the chest, imaging features are felt to be most compatible with metastatic disease. Hepatic abscess is not entirely excluded. 2. Cholelithiasis. Gas in the lumen of the gallbladder, similar to prior CT in suggesting prior sphincterotomy. In the absence of prior sphincterotomy, infection would be a concern. 3. Mild gaseous small bowel dilatation within the visualized upper abdomen consistent with small-bowel obstruction documented on prior imaging. 4.  Aortic Atherosclerois (ICD10-170.0) Electronically Signed   By: Camellia Candle M.D.   On: 05/23/2024 05:21   CT CHEST W CONTRAST Result Date: 05/22/2024 CLINICAL DATA:  Pulmonary nodule history of rectal cancer * Tracking Code: BO * EXAM: CT CHEST WITH CONTRAST TECHNIQUE: Multidetector CT imaging of the chest was performed during intravenous contrast administration. RADIATION DOSE REDUCTION: This exam was performed according to the departmental dose-optimization program which includes automated exposure control, adjustment of the mA and/or kV according to patient size and/or use of iterative reconstruction technique. CONTRAST:  75mL OMNIPAQUE  IOHEXOL  300 MG/ML  SOLN COMPARISON:  CT abdomen pelvis, 05/20/2024, CT chest, 11/10/2022 FINDINGS: Cardiovascular: Aortic atherosclerosis. Normal heart size. Three-vessel coronary artery calcifications. No pericardial effusion. Mediastinum/Nodes: No enlarged mediastinal, hilar, or axillary lymph nodes. Thyroid gland, trachea, and esophagus demonstrate no significant findings. Lungs/Pleura: Moderate centrilobular and paraseptal emphysema. Spiculated,  cavitary nodule in the anterior right upper lobe measuring 2.3 x 2.1 cm (series 4, image 45). Spiculated nodule in the right pulmonary apex measuring 2.1 x 1.7 cm, increased in size compared to prior examination dated 11/02/2022, at which time it measured 1.4 x 1.2 cm (series 4, image 26). Dependent bibasilar atelectasis or consolidation.  No pleural effusion or pneumothorax. Upper Abdomen: No acute abnormality. Coarse, nodular cirrhotic morphology of the liver. Hypodense liver lesion in the hepatic dome as seen by prior CT of the abdomen pelvis (series 2, image 124). Hepatic steatosis. Musculoskeletal: No chest wall abnormality. No acute osseous findings. IMPRESSION: 1. Spiculated, cavitary nodule in the anterior right upper lobe measuring 2.3 x 2.1 cm, which may reflect primary lung malignancy or metastasis. 2. Spiculated nodule in the right pulmonary apex measuring 2.1 x 1.7 cm, increased in size compared to prior examination dated 11/02/2022, at which time it measured 1.4 x 1.2 cm. This is most consistent with a slowly enlarging primary lung malignancy although metastasis would remain a differential consideration in the setting of known rectal malignancy. 3. Emphysema. 4. Coronary artery disease. 5. Cirrhosis and hepatic steatosis. Indeterminate liver lesion as noted by prior CT of the abdomen and pelvis, highly suspicious for metastasis. Aortic Atherosclerosis (ICD10-I70.0) and Emphysema (ICD10-J43.9). Electronically Signed   By: Marolyn JONETTA Jaksch M.D.   On: 05/22/2024 21:45   DG Abd Portable 1V-Small Bowel Obstruction Protocol-initial, 8 hr delay Result Date: 05/21/2024 CLINICAL DATA:  SBO- 8 hr picture EXAM: PORTABLE ABDOMEN - 1 VIEW COMPARISON:  CT abdomen pelvis 05/20/2024, x-ray abdomen 05/21/2024 FINDINGS: Enteric tube with tip and side port overlying the expected region the gastric lumen. PO contrast reaches the large bowel. PO contrast again noted within dilated small bowel. Gaseous dilatation of several  loops of small bowel. No radio-opaque calculi or other significant radiographic abnormality are seen. IMPRESSION: Findings suggestive of partial small bowel obstruction. Electronically Signed   By: Morgane  Naveau M.D.   On: 05/21/2024 20:01   DG Abd Portable 1V Result Date: 05/21/2024 CLINICAL DATA:  NG tube placement EXAM: PORTABLE ABDOMEN - 1 VIEW COMPARISON:  CT abdomen and pelvis 05/20/2024 FINDINGS: Limited field of view for tube placement verification purposes. An enteric tube is present with tip projecting over the left upper quadrant consistent with location in the body of the stomach. Lung bases are clear. Limited visualization of bowel but gaseous distention is suggested. Degenerative changes in the spine. IMPRESSION: Enteric tube tip projects over the left upper quadrant consistent with location in the body of stomach. Electronically Signed   By: Elsie Gravely M.D.   On: 05/21/2024 00:19   CT ABDOMEN PELVIS W CONTRAST Result Date: 05/20/2024 CLINICAL DATA:  Sharp abdominal pain nausea vomiting history of colorectal cancer coffee-ground emesis, history of rectal cancer EXAM: CT ABDOMEN AND PELVIS WITH CONTRAST TECHNIQUE: Multidetector CT imaging of the abdomen and pelvis was performed using the standard protocol following bolus administration of intravenous contrast. RADIATION DOSE REDUCTION: This exam was performed according to the departmental dose-optimization program which includes automated exposure control, adjustment of the mA and/or kV according to patient size and/or use of iterative reconstruction technique. CONTRAST:  80mL OMNIPAQUE  IOHEXOL  300 MG/ML  SOLN COMPARISON:  CT 11/13/2022, 11/08/2022, 02/19/2021, chest CT 11/10/2022 FINDINGS: Lower chest: Lung bases demonstrate mild subpleural reticulation and dependent atelectasis. 7 mm subpleural focus of nodularity or nodule left lung base, series 4, image 4. Coronary vascular calcification Hepatobiliary: Possible subtle surface nodularity  of liver. Subcentimeter hypodensities too small to further characterize. Interim development of indeterminate hypodense lesion within the right hepatic lobe, 14 mm on series 2, image 12. Gallstones. Small volume of air within the gallbladder and common bile duct, correlate for interval history of sphincterotomy Pancreas: Unremarkable. No pancreatic ductal dilatation or surrounding inflammatory changes. Spleen: Normal in size without focal abnormality. Adrenals/Urinary  Tract: Stable adrenal glands. No hydronephrosis. Renal cysts for which no imaging follow-up is recommended. Slightly thick-walled urinary bladder with mild mucosal enhancement. 10 mm stone within the posterior bladder. Stomach/Bowel: Moderate fluid distension of the stomach. Multiple dilated fluid-filled loops of small bowel measuring up to 3.8 cm consistent with a bowel obstruction. Apparent 2 transition points within close vicinity of each other within the anterior pelvis, series 2, image 53 and series 2, image 60. Some mesenteric congestion in the pelvis. No acute bowel wall thickening. Negative appendix. Status post low anterior resection with left abdominal colostomy. Vascular/Lymphatic: Advanced aortic atherosclerosis. No aneurysm. No suspicious lymph nodes. Reproductive: Negative prostate Other: Negative for free air. Similar presacral soft tissue thickening. No significant ascites. Musculoskeletal: Chronic compression fractures at L4 and L5 and superior endplate of L1 IMPRESSION: 1. Findings consistent with small-bowel obstruction with 2 transition points within close vicinity of each other within the anterior pelvis, possibly due to adhesions but given appearance, closed loop physiology cannot be excluded. There is some mesenteric vascular congestion within the pelvis. 2. Status post low anterior resection with left abdominal colostomy. Similar presacral soft tissue thickening. 3. Interim development of 14 mm indeterminate hypodense lesion  within the right hepatic lobe. Given history of rectal cancer, recommend further evaluation with nonemergent MRI of the abdomen with and without contrast. 4. Gallstones. Small volume of air within the gallbladder and common bile duct, correlate for interval history of sphincterotomy. 5. 10 mm stone within the posterior bladder. Slightly thick-walled urinary bladder with mild mucosal enhancement, correlate with urinalysis to exclude cystitis. 6. 7 mm subpleural focus of nodularity or nodule at the left lung base. Short-term CT chest follow-up recommended to exclude developing pulmonary nodule. 7. Aortic atherosclerosis. Aortic Atherosclerosis (ICD10-I70.0). Electronically Signed   By: Luke Bun M.D.   On: 05/20/2024 20:16   DG Chest Portable 1 View Result Date: 05/20/2024 CLINICAL DATA:  Shortness of breath with questionable hypoxia and cough, evaluate for pneumonia in the setting of possible sepsis EXAM: PORTABLE CHEST 1 VIEW COMPARISON:  Chest x-ray 11/13/2022 FINDINGS: The heart and mediastinal contours are unchanged. Atherosclerotic plaque. No focal consolidation. No pulmonary edema. No pleural effusion. No pneumothorax. No acute osseous abnormality. IMPRESSION: 1. No active disease. 2.  Aortic Atherosclerosis (ICD10-I70.0). Electronically Signed   By: Morgane  Naveau M.D.   On: 05/20/2024 17:47    Labs:  CBC: Recent Labs    06/01/24 0544 06/03/24 0505 06/04/24 0511 06/05/24 0505  WBC 13.2* 13.2* 11.2* 12.0*  HGB 9.9* 9.7* 9.7* 8.8*  HCT 28.9* 29.3* 29.5* 25.9*  PLT 174 179 182 205    COAGS: Recent Labs    05/20/24 2258  INR 1.2  APTT 23*    BMP: Recent Labs    06/03/24 0505 06/04/24 0511 06/05/24 0505 06/06/24 0356  NA 134* 134* 130* 134*  K 5.2* 4.9 4.6 4.5  CL 100 102 102 104  CO2 27 26 23 24   GLUCOSE 166* 169* 208* 192*  BUN 18 19 22  26*  CALCIUM 8.6* 8.6* 8.3* 8.3*  CREATININE 0.75 0.77 0.58* 0.70  GFRNONAA >60 >60 >60 >60    LIVER FUNCTION TESTS: Recent  Labs    05/30/24 0515 05/31/24 0606 06/03/24 0505 06/06/24 0356  BILITOT 0.8 0.5 0.3 0.7  AST 36 29 34 84*  ALT 18 12 16  59*  ALKPHOS 118 91 101 158*  PROT 5.9* 5.1* 5.6* 5.5*  ALBUMIN 2.2* 2.0* 2.2* 1.9*    TUMOR MARKERS: No results for input(s): AFPTM,  CEA, CA199, CHROMGRNA in the last 8760 hours.  Assessment and Plan:  Stephen [name] is a 78 y.o. 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 Currently in the hospital after presenting with abdominal pain, abdominal distention and emesis. Found to have small bowel obstruction. Had initial exploratory laparotomy for this, due to dense adhesions from prior surgeries, on 05/24/24. He then developed dehiscence of his abdominal wall due to weak abdominal fascia and had repeat surgery to correct this on 05/29/24. Repeat CT scan today, 06/06/24 shows a pelvic fluid collection near the bladder with surgery consulting our service for image guided drain placement.   Case and imaging reviewed with IR attending Dr. Luverne with approval to proceed.  Plan for procedure tomorrow, 06/07/24. Pt to be NPO at midnight. AM Lovenox  held tomorrow.  Risks and benefits discussed with the patient including bleeding, infection, damage to adjacent structures, bowel or bladder perforation/fistula connection, and sepsis. All of the patient's questions were answered, patient is agreeable to proceed. Consent signed and in chart.   Thank you for allowing our service to participate in Jasraj Lappe 's care.  Electronically Signed: Kimble VEAR Clas, PA-C   06/06/2024, 4:09 PM      I spent a total of 20 Minutes    in face to face in clinical consultation, greater than 50% of which was counseling/coordinating care for pelvic drain placement.

## 2024-06-06 NOTE — Progress Notes (Signed)
 PROGRESS NOTE    [name]  FMW:[id number] DOB: 04-Feb-1946 DOA: 05/20/2024 PCP: Derick Leita POUR, MD  Chief Complaint  Patient presents with   Emesis    Hospital Course:  Stephen [name] is a 78 year old male with history of rectal cancer metastasized to lung (status post colostomy, chemotherapy, radiation) hypertension, GERD, depression, tobacco abuse, alcohol abuse, prior DVT not currently on [facility], who presented to the ED with abdominal pain, distention, coffee-ground emesis.  He was admitted for small bowel obstruction.  SBO was persistent despite conservative management.  General surgery took the patient for exploratory laparotomy on 7/11.  He underwent small bowel resection and appendectomy. Postoperative course has been further complicated by ileus.  He went back to the ER on 7/16 for reopening of recent laparotomy and closure of abdominal wall.  Patient has been on TPN.  CT scan also reveals hepatic mass which was thought to be malignancy, metastasis versus primary.  Oncology was consulted and has discussed this with the patient and his wife.  They are both aware that this is likely recurrent cancer but since the patient is bedbound at baseline and thus has poor functional status, there are no cancer therapies available to him at this time.  Patient is not interested in pursuing a biopsy of this mass.  Patient's wife reports she is not ready for hospice.  Currently they are planning to manage his SBO and discharged home when he is surgically cleared.  Subjective: Worsening nausea vomiting this morning.  Unresponsive to Zofran , some improvement with added Reglan .   Objective: Vitals:   06/05/24 1753 06/05/24 2023 06/06/24 0351 06/06/24 0407  BP: (!) 85/49 (!) 81/53 99/62   Pulse: 87 92 85   Resp:  16 16   Temp:  98.3 F (36.8 C) 98.5 F (36.9 C)   TempSrc:  Oral Oral   SpO2:  94% 99%   Weight:    96.6 kg  Height:        Intake/Output Summary (Last 24 hours) at 06/06/2024  1550 Last data filed at 06/06/2024 1437 Gross per 24 hour  Intake 1843.96 ml  Output 2350 ml  Net -506.04 ml   Filed Weights   06/02/24 0242 06/03/24 0423 06/06/24 0407  Weight: 99.8 kg 96.7 kg 96.6 kg    Examination: General exam: Appears calm and comfortable, NAD  Respiratory system: No work of breathing, symmetric chest wall expansion Cardiovascular system: S1 & S2 heard, RRR.  Gastrointestinal system: appropriately tender to palpation.  Colostomy in place, wound VAC in place.  Bowel sounds present, abdomen is not significantly distended Neuro: Alert and oriented. No focal neurological deficits. Extremities: Symmetric, expected ROM Skin: No rashes, lesions Psychiatry: Demonstrates appropriate judgement and insight. Mood & affect appropriate for situation.   Assessment & Plan:  Principal Problem:   SBO (small bowel obstruction) (HCC) Active Problems:   UTI (urinary tract infection)   AKI (acute kidney injury) (HCC)   HTN (hypertension)   Chronic diastolic CHF (congestive heart failure) (HCC)   Rectal cancer (HCC)   Liver lesion   Coffee ground emesis   Alcohol use   Overweight (BMI 25.0-29.9)   Malnutrition of moderate degree    SBO status postsurgical intervention Postop ileus - 7/11: Ex lap with small bowel resection and appendectomy - 7/16: Abdominal wall dehiscence, abdominal closure, wound VAC placement - NG tube removed 7/15, reinserted 7/17, removed 7/21 - Continue with TPN, diet advancement per general surgery.  Liquid diet for now. - With recurrence of nausea  vomiting this morning, there is concern for persistent ileus.  Repeat CT scan ordered.  Concerning for possible fluid collection near anastomosis - IR consulted for possible drain placement. - General Surgery still following. - Wound VAC in place.  Next exchange will be Friday 7/25  History of rectal cancer Colostomy status New liver and lung masses - Isolated mets to left lung, status postresection  and adjuvant chemo.  Last oncology visit in 2022 lost to follow-up.  Patient's wife reports she can no longer transport him to doctor appointments - On this admission CT abdomen pelvis: Left lung base nodule as well as liver nodule.  MRI of liver and CT chest consistent with probable malignancy, metastasis versus new primary - Dr. Jacobo has been consulted and reviewed.  Reports no acute intervention at this time - Oncology has also discussed directly with patient and his wife.  They are aware that due to his poor functional status oncology cannot offer treatment - Patient's wife is currently declining hospice - Plan will be to discharge home once SBO is managed.  He will need home health and wound VAC management. - Palliative care consulted  Hyponatremia  AKI Lactic acidosis - Likely prerenal secondary to the above - Resolved now - Continue to trend CMP  Acute cystitis - Status post 5 days antibiotic course.  Culture with multiple species growth  Bedbound - At baseline   Hypertension - Stable without meds currently.  Monitor closely and titrate as needed  Alcohol use disorder - Denies withdrawal history.  No signs or symptoms of withdrawal during this admission  Macrocytic anemia - B12 and folate within normal limits - Continue TPN with supplementation as above.  DVT prophylaxis: lovenox    Code Status: Limited: Do not attempt resuscitation (DNR) -DNR-LIMITED -Do Not Intubate/DNI  Disposition: Inpatient.  Potential IR procedure tomorrow  Consultants:  Treatment Team:  Consulting Physician: Rodolph Romano, MD  Procedures:    Antimicrobials:  Anti-infectives (From admission, onward)    Start     Dose/Rate Route Frequency Ordered Stop   05/24/24 1015  ceFAZolin  (ANCEF ) IVPB 2g/100 mL premix  Status:  Discontinued        2 g 200 mL/hr over 30 Minutes Intravenous  Once 05/24/24 0921 05/27/24 0738   05/23/24 1300  cefTRIAXone  (ROCEPHIN ) 1 g in sodium chloride  0.9  % 100 mL IVPB        1 g 200 mL/hr over 30 Minutes Intravenous Every 24 hours 05/23/24 1208 05/29/24 1235   05/21/24 1800  cefTRIAXone  (ROCEPHIN ) 1 g in sodium chloride  0.9 % 100 mL IVPB        1 g 200 mL/hr over 30 Minutes Intravenous  Once 05/21/24 1049 05/21/24 1929   05/20/24 1800  cefTRIAXone  (ROCEPHIN ) 2 g in sodium chloride  0.9 % 100 mL IVPB        2 g 200 mL/hr over 30 Minutes Intravenous  Once 05/20/24 1750 05/20/24 2000       Data Reviewed: I have personally reviewed following labs and imaging studies CBC: Recent Labs  Lab 05/31/24 0606 06/01/24 0544 06/03/24 0505 06/04/24 0511 06/05/24 0505  WBC 15.7* 13.2* 13.2* 11.2* 12.0*  NEUTROABS 13.1* 9.7* 10.2* 8.1* 8.8*  HGB 10.0* 9.9* 9.7* 9.7* 8.8*  HCT 29.2* 28.9* 29.3* 29.5* 25.9*  MCV 102.1* 102.5* 105.4* 104.6* 101.6*  PLT 187 174 179 182 205   Basic Metabolic Panel: Recent Labs  Lab 05/31/24 0606 06/01/24 0544 06/02/24 0539 06/03/24 0505 06/04/24 9488 06/05/24 0505 06/06/24 0356  NA 138 137  --  134* 134* 130* 134*  K 3.9 4.2  --  5.2* 4.9 4.6 4.5  CL 104 106  --  100 102 102 104  CO2 26 27  --  27 26 23 24   GLUCOSE 125* 130*  --  166* 169* 208* 192*  BUN 14 15  --  18 19 22  26*  CREATININE 0.75 0.75  --  0.75 0.77 0.58* 0.70  CALCIUM 8.1* 8.0*  --  8.6* 8.6* 8.3* 8.3*  MG 2.0 2.1 2.1 2.2 2.2 2.2 2.1  PHOS 1.6* 2.5 2.7 3.2  --   --  3.1   GFR: Estimated Creatinine Clearance: 85.6 mL/min (by C-G formula based on SCr of 0.7 mg/dL). Liver Function Tests: Recent Labs  Lab 05/31/24 0606 06/03/24 0505 06/06/24 0356  AST 29 34 84*  ALT 12 16 59*  ALKPHOS 91 101 158*  BILITOT 0.5 0.3 0.7  PROT 5.1* 5.6* 5.5*  ALBUMIN 2.0* 2.2* 1.9*   CBG: Recent Labs  Lab 06/05/24 2027 06/05/24 2321 06/06/24 0352 06/06/24 0749 06/06/24 1149  GLUCAP 175* 189* 179* 174* 163*    No results found for this or any previous visit (from the past 240 hours).   Radiology Studies: No results found.  Scheduled  Meds:  vitamin C   500 mg Oral BID   Chlorhexidine  Gluconate Cloth  6 each Topical Daily   enoxaparin  (LOVENOX ) injection  40 mg Subcutaneous Q24H   feeding supplement  237 mL Oral TID BM   insulin  aspart  0-15 Units Subcutaneous Q4H   ketorolac   1 drop Right Eye QID   pantoprazole  (PROTONIX ) IV  40 mg Intravenous Q12H   polyethylene glycol  17 g Oral Daily   sodium chloride  flush  10-40 mL Intracatheter Q12H   Continuous Infusions:  TPN ADULT (ION) 80 mL/hr at 06/05/24 2000   TPN ADULT (ION)       LOS: 17 days  MDM: Patient is high risk for one or more organ failure.  They necessitate ongoing hospitalization for continued IV therapies and subsequent lab monitoring. Total time spent interpreting labs and vitals, reviewing the medical record, coordinating care amongst consultants and care team members, directly assessing and discussing care with the patient and/or family: 55 min  Longino Trefz, DO [facility]  To contact the attending physician between 7A-7P please use Epic Chat. To contact the covering physician during after hours 7P-7A, please review Amion.  06/06/2024, 3:50 PM   *This document has been created with the assistance of dictation software. Please excuse typographical errors. *

## 2024-06-06 NOTE — Consult Note (Signed)
 PHARMACY - TOTAL PARENTERAL NUTRITION CONSULT NOTE   Indication: Prolonged ileus  Patient Measurements: Height: 5' 7 (170.2 cm) Weight: 96.6 kg (212 lb 15.4 oz) IBW/kg (Calculated) : 66.1 TPN AdjBW (KG): 71.1 Body mass index is 33.35 kg/m.  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 Admitted with small bowel obstruction. Small bowel resection and appendectomy on 05/24/2024. Noted N/V and abd distention on 7/16. Revision done 7/16 in the evening. Noted prolonged ileus and pharmacy consulted to initiate and manage TPN.  Glucose / Insulin : No hx of diabetes,A1C 4.8% BG 151-206.  18 units of insulin  given in last 24 hours Increase SSI to moderate scale 7/23 Electrolytes: Na 134 Renal: Scr < 1 (stable) Hepatic: AST 84, ALT 59, will reduce lipid in TPN and recheck tomorrow Intake / Output; MIVF:  Intake/Output Summary (Last 24 hours) at 06/06/2024 9182 Last data filed at 06/06/2024 0400 Gross per 24 hour  Intake 1603.96 ml  Output 2000 ml  Net -396.04 ml    GI Imaging: GI Surgeries / Procedures: laparotomy with small bowel resection and appendectomy on 05/24/2024    Central access: 7/17 TPN start date: 7/17  Nutritional Goals: Goal TPN rate is 80 mL/hr (provides 109 g of protein and 1898 kcals per day)  RD Assessment: Estimated Needs Total Energy Estimated Needs: 1900-2200kcal/day Total Protein Estimated Needs: 95-110g/day Total Fluid Estimated Needs: 1.7-2.0L/day  Current Nutrition: FLD  Plan:  Continue TPN at 80 mL/hr at 1800 Electrolytes in TPN: Na 74mEq/L , K 28mEq/L, Ca 75mEq/L, Mg 34mEq/L, and Phos 15mmol/L. Cl:Ac 1:1 Add standard MVI and trace elements to TPN Add thiamine  and folic acid  to TPN daily until able to take PO Continue moderate q4h SSI and adjust as needed  Monitor TPN  labs on Mon/Thurs, daily until stable   Mose Blew, PharmD, BCPS 06/06/2024 8:17 AM

## 2024-06-06 NOTE — TOC Progression Note (Addendum)
 Transition of Care [facility]) - Progression Note    Patient Details  Name: Stephen [name] MRN: [id number] Date of Birth: 1945/11/15  Transition of [facility] CM/SW Contact  Corean ONEIDA Haddock, RN Phone Number: 06/06/2024, 3:28 PM  Clinical Narrative:     Patient still with TPN Per Surgery patient will require wound vac at discharge  Closer to discharge patient referral for home wound vac will need to be made Discussed with patient that he will require home health RN at discharge.  He is in agreement and request that I call his wife to see if she has a preference of agency.  Call placed to wife, she states that she does not have a preference of agency.  Referral made to [facility].  Cory with [facility] to review   - update per Darleene with [facility] they can only accept for twice a week vac changes  Larraine with [facility] has accepted for vac changes 3 times a week   Expected Discharge Plan: Home/Self Care Barriers to Discharge: Continued Medical Work up               Expected Discharge Plan and Services     Post Acute Care Choice: NA Living arrangements for the past 2 months: Single Family Home                                       Social Drivers of Health (SDOH) Interventions SDOH Screenings   Food Insecurity: No Food Insecurity (05/21/2024)  Housing: Low Risk  (05/21/2024)  Transportation Needs: No Transportation Needs (05/21/2024)  Utilities: Not At Risk (05/21/2024)  Social Connections: Moderately Integrated (05/21/2024)  Tobacco Use: Medium Risk (05/24/2024)    Readmission Risk Interventions     No data to display

## 2024-06-07 ENCOUNTER — Inpatient Hospital Stay

## 2024-06-07 DIAGNOSIS — K651 Peritoneal abscess: Secondary | ICD-10-CM | POA: Insufficient documentation

## 2024-06-07 DIAGNOSIS — K56609 Unspecified intestinal obstruction, unspecified as to partial versus complete obstruction: Secondary | ICD-10-CM | POA: Diagnosis not present

## 2024-06-07 LAB — COMPREHENSIVE METABOLIC PANEL WITH GFR
ALT: 63 U/L — ABNORMAL HIGH (ref 0–44)
AST: 85 U/L — ABNORMAL HIGH (ref 15–41)
Albumin: 2.1 g/dL — ABNORMAL LOW (ref 3.5–5.0)
Alkaline Phosphatase: 182 U/L — ABNORMAL HIGH (ref 38–126)
Anion gap: 5 (ref 5–15)
BUN: 25 mg/dL — ABNORMAL HIGH (ref 8–23)
CO2: 24 mmol/L (ref 22–32)
Calcium: 8.5 mg/dL — ABNORMAL LOW (ref 8.9–10.3)
Chloride: 106 mmol/L (ref 98–111)
Creatinine, Ser: 0.7 mg/dL (ref 0.61–1.24)
GFR, Estimated: >60 mL/min (ref >60)
Glucose, Bld: 185 mg/dL — ABNORMAL HIGH (ref 70–99)
Potassium: 4.5 mmol/L (ref 3.5–5.1)
Sodium: 135 mmol/L (ref 135–145)
Total Bilirubin: 0.6 mg/dL (ref 0.0–1.2)
Total Protein: 6 g/dL — ABNORMAL LOW (ref 6.5–8.1)

## 2024-06-07 LAB — GLUCOSE, CAPILLARY
Glucose-Capillary: 189 mg/dL — ABNORMAL HIGH (ref 70–99)
Glucose-Capillary: 171 mg/dL — ABNORMAL HIGH (ref 70–99)
Glucose-Capillary: 184 mg/dL — ABNORMAL HIGH (ref 70–99)
Glucose-Capillary: 167 mg/dL — ABNORMAL HIGH (ref 70–99)
Glucose-Capillary: 158 mg/dL — ABNORMAL HIGH (ref 70–99)
Glucose-Capillary: 170 mg/dL — ABNORMAL HIGH (ref 70–99)
Glucose-Capillary: 155 mg/dL — ABNORMAL HIGH (ref 70–99)

## 2024-06-07 LAB — TRIGLYCERIDES: Triglycerides: 80 mg/dL (ref <150)

## 2024-06-07 LAB — MAGNESIUM: Magnesium: 2.2 mg/dL (ref 1.7–2.4)

## 2024-06-07 MED ORDER — ENOXAPARIN SODIUM 40 MG/0.4ML IJ SOSY
40.0000 mg | PREFILLED_SYRINGE | INTRAMUSCULAR | Status: DC
  Administered 2024-06-08 – 2024-06-12 (×4): 40 mg via SUBCUTANEOUS
  Filled 2024-06-07 (×5): qty 0.4

## 2024-06-07 MED ORDER — TRAVASOL 10 % IV SOLN
INTRAVENOUS | Status: AC
  Filled 2024-06-07: qty 1094.4

## 2024-06-07 MED ORDER — LIDOCAINE HCL (PF) 1 % IJ SOLN
10.0000 mL | Freq: Once | INTRAMUSCULAR | Status: AC
  Administered 2024-06-07: 10 mL via INTRADERMAL
  Filled 2024-06-07: qty 10

## 2024-06-07 MED ORDER — SODIUM CHLORIDE 0.9% FLUSH
5.0000 mL | Freq: Three times a day (TID) | INTRAVENOUS | Status: DC
  Administered 2024-06-07 – 2024-06-12 (×16): 5 mL

## 2024-06-07 MED ORDER — MIDAZOLAM HCL 5 MG/5ML IJ SOLN
INTRAMUSCULAR | Status: AC | PRN
  Administered 2024-06-07: 1 mg via INTRAVENOUS

## 2024-06-07 MED ORDER — FENTANYL CITRATE (PF) 100 MCG/2ML IJ SOLN
INTRAMUSCULAR | Status: AC
  Filled 2024-06-07: qty 2

## 2024-06-07 MED ORDER — MIDAZOLAM HCL 2 MG/2ML IJ SOLN
INTRAMUSCULAR | Status: AC
  Filled 2024-06-07: qty 2

## 2024-06-07 MED ORDER — FENTANYL CITRATE (PF) 100 MCG/2ML IJ SOLN
INTRAMUSCULAR | Status: AC | PRN
  Administered 2024-06-07: 50 ug via INTRAVENOUS

## 2024-06-07 NOTE — Plan of Care (Signed)
  Problem: Education: Goal: Knowledge of General Education information will improve Description: Including pain rating scale, medication(s)/side effects and non-pharmacologic comfort measures Outcome: Progressing   Problem: Clinical Measurements: Goal: Respiratory complications will improve Outcome: Progressing Goal: Cardiovascular complication will be avoided Outcome: Progressing   Problem: Coping: Goal: Level of anxiety will decrease Outcome: Progressing   Problem: Elimination: Goal: Will not experience complications related to bowel motility Outcome: Progressing Goal: Will not experience complications related to urinary retention Outcome: Progressing   Problem: Pain Managment: Goal: General experience of comfort will improve and/or be controlled Outcome: Progressing   Problem: Safety: Goal: Ability to remain free from injury will improve Outcome: Progressing   Problem: Skin Integrity: Goal: Risk for impaired skin integrity will decrease Outcome: Progressing

## 2024-06-07 NOTE — Progress Notes (Signed)
 [facility]- General Surgery  SURGICAL PROGRESS NOTE  Hospital Day(s): 18.   Post op day(s): 9 Days Post-Op.   Interval History:  Patient is about the same as yesterday.  Gas still present in colostomy bag with low output.  NPO for procedure today.  No new complaints.   Vital signs in last 24 hours: [min-max] current  Temp:  [97.6 F (36.4 C)-98.4 F (36.9 C)] 97.6 F (36.4 C) (07/25 1007) Pulse Rate:  [86-94] 88 (07/25 1007) Resp:  [16-24] 24 (07/25 1007) BP: (94-120)/(54-64) 111/62 (07/25 1007) SpO2:  [93 %-100 %] 93 % (07/25 1007)     Height: 5' 7 (170.2 cm) Weight: 96.6 kg BMI (Calculated): 33.35   Intake/Output last 2 shifts:  07/24 0701 - 07/25 0700 In: 240 [P.O.:240] Out: 2025 [Urine:2025]   Physical Exam:  Constitutional: alert, cooperative and no distress  Respiratory: breathing non-labored at rest  Cardiovascular: regular rate and sinus rhythm  Gastrointestinal: soft, mildly tender, and less distended, wound vac intact, colostomy bag with gas and low stool output   Labs:     Latest Ref Rng & Units 06/05/2024    5:05 AM 06/04/2024    5:11 AM 06/03/2024    5:05 AM  CBC  WBC 4.0 - 10.5 K/uL 12.0  11.2  13.2   Hemoglobin 13.0 - 17.0 g/dL 8.8  9.7  9.7   Hematocrit 39.0 - 52.0 % 25.9  29.5  29.3   Platelets 150 - 400 K/uL 205  182  179       Latest Ref Rng & Units 06/07/2024    3:49 AM 06/06/2024    3:56 AM 06/05/2024    5:05 AM  CMP  Glucose 70 - 99 mg/dL 814  807  791   BUN 8 - 23 mg/dL 25  26  22    Creatinine 0.61 - 1.24 mg/dL 9.29  9.29  9.41   Sodium 135 - 145 mmol/L 135  134  130   Potassium 3.5 - 5.1 mmol/L 4.5  4.5  4.6   Chloride 98 - 111 mmol/L 106  104  102   CO2 22 - 32 mmol/L 24  24  23    Calcium 8.9 - 10.3 mg/dL 8.5  8.3  8.3   Total Protein 6.5 - 8.1 g/dL 6.0  5.5    Total Bilirubin 0.0 - 1.2 mg/dL 0.6  0.7    Alkaline Phos 38 - 126 U/L 182  158    AST 15 - 41 U/L 85  84    ALT 0 - 44 U/L 63  59      Imaging studies:  CLINICAL DATA:   Recurrent non resolving ileus after small-bowel resection.   EXAM: CT ABDOMEN AND PELVIS WITH CONTRAST   TECHNIQUE: Multidetector CT imaging of the abdomen and pelvis was performed using the standard protocol following bolus administration of intravenous contrast.   RADIATION DOSE REDUCTION: This exam was performed according to the departmental dose-optimization program which includes automated exposure control, adjustment of the mA and/or kV according to patient size and/or use of iterative reconstruction technique.   CONTRAST:  OMNIPAQUE  IOHEXOL  300 MG/ML  SOLN   COMPARISON:  Most recent CT 05/20/2024   FINDINGS: Lower chest: Hypoventilatory changes in the right greater than left lower lobe. No pleural effusion. Benign calcified granuloma in the right lower lobe.   Hepatobiliary: Hypodense lesion in the right lobe of the liver measures 19 mm, characterized on recent MRI. No change in size from that exam.  Additional tiny hypodensity in the anterior liver is unchanged. Multiple gallstones in the gallbladder. Pneumobilia was also present on prior exam. No gallbladder inflammation.   Pancreas: No ductal dilatation or inflammation.   Spleen: Normal in size without focal abnormality.   Adrenals/Urinary Tract: Normal adrenal glands. No hydronephrosis or renal inflammation. Right renal cyst. 9 mm calcification in the dependent urinary bladder. Mild posterior bladder wall thickening.   Stomach/Bowel: Minimal hiatal hernia. The stomach is nondistended. Administered enteric contrast progresses through the small bowel into the colon. Few prominent and mildly dilated loops of small bowel are seen in the pelvis, 3.2 cm. Appendectomy. Contrast in the colon extends to the colon just before the colostomy. Status post low anterior resection.   Vascular/Lymphatic: Aortic and branch atherosclerosis. Portal vein is patent. No enlarged lymph in the abdomen or pelvis.    Reproductive: Prostate is unremarkable.   Other: Pelvic fluid collection measures 11.7 x 5.3 x 4.7 cm, series 2, image 61. There is some peripheral enhancement. This abuts a loop of small bowel in the pelvis. Postsurgical change of the anterior abdominal wall without subcutaneous collection. Trace free fluid in the right upper quadrant. No free air.   Musculoskeletal: The bones are subjectively under mineralized. L4 and L5 compression fractures are unchanged. Mild L1 superior endplate compression fracture is unchanged.   IMPRESSION: 1. Pelvic fluid collection measures 11.7 x 5.3 x 4.7 cm, suspicious for abscess 2. Few prominent but not abnormally dilated loops of small bowel in the pelvis. Administered enteric contrast progresses through the small bowel into the colon. 3. A 9 mm calcification in the dependent urinary bladder. Mild posterior bladder wall thickening. 4. Cholelithiasis.   Aortic Atherosclerosis (ICD10-I70.0).     Electronically Signed   By: Andrea Gasman M.D.   On: 06/06/2024 17:24  Assessment/Plan:  78 y.o. male with  small bowel obstruction 14 Days Post-op s/p exploratory laparotomy with small bowel resection and appendectomy, and 9 Days Post-Op s/p revision and closure of abdominal wall due to dehiscence and evisceration of omental fat complicated by pertinent comorbidities including HFpEF, alcohol use, metastatic rectal cancer with Foley and colostomy, HTN, AKI, and UTI.    - Stable vital signs  - Plan for IR to place drain for pelvic fluid collection, likely hematoma.  Low suspicion of anastomosis leak.  Patient overall appears comfortable on exam and enteric contrast shown in colon on imaging. No gas shown with fluid collection.  Plan to start patient on soft diet once drain is placed.  Once patient is tolerating soft diet, will likely be clear from surgical standpoint.   - Continue NPO for now until procedure  - Continue TPN  - Plan to change wound VAC  today  - Continue pain management and DVT prophylaxis  -- Wyonia Fontanella Barrientos PA-C

## 2024-06-07 NOTE — Progress Notes (Signed)
 Patient clinically stable post CT abdominal  10 FR drain placement per DR Johann, tolerated well. Dark tan/red drainage with amt charted. Received Versed  1 mg along with Fentanyl  50 mcg IV for procedure. Report given to Alyson Patterson RN post procedure/specials./21

## 2024-06-07 NOTE — Procedures (Signed)
  Procedure:  CT pelvic abscess drain placement Right, 70F Preprocedure diagnosis: The primary encounter diagnosis was Small bowel obstruction (HCC). Diagnoses of Acute cystitis with hematuria and Sepsis, due to unspecified organism, unspecified whether acute organ dysfunction present [facility]) were also pertinent to this visit. Postprocedure diagnosis: same EBL:    minimal Complications:   none immediate  See full dictation in [employer].  CHARM Toribio Faes MD Main # [phone] Pager  [phone] Mobile [phone]

## 2024-06-07 NOTE — Progress Notes (Signed)
 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  He was admitted for small bowel obstruction.  SBO was persistent despite conservative management.  General surgery took the patient for exploratory laparotomy on 7/11.  He underwent small bowel resection and appendectomy. Postoperative course has been further complicated by ileus.  He went back to the ER on 7/16 for reopening of recent laparotomy and closure of abdominal wall.  Patient has been on TPN.  CT scan also reveals hepatic mass which was thought to be malignancy, metastasis versus primary.  Oncology was consulted and has discussed this with the patient and his wife.  They are both aware that this is likely recurrent cancer but since the patient is bedbound at baseline and thus has poor functional status, there are no cancer therapies available to him at this time.  Patient is not interested in pursuing a biopsy of this mass.  Patient's wife reports she is not ready for hospice.  Currently they are planning to manage his SBO and discharged home when he is surgically cleared.  Subjective: Patient evaluated after IR drain placement today. He has no acute complaints.  Pain currently well-managed.  He is tolerating a soft diet.  Objective: Vitals:   06/07/24 1130 06/07/24 1154 06/07/24 1405 06/07/24 1538  BP: 98/65 99/61 129/80 (!) 114/56  Pulse: 87 88 71 96  Resp: 19   16  Temp:   98.7 F (37.1 C) 98.7 F (37.1 C)  TempSrc:   Oral   SpO2: 94% 95% 95% 96%  Weight:      Height:        Intake/Output  Summary (Last 24 hours) at 06/07/2024 1613 Last data filed at 06/07/2024 1300 Gross per 24 hour  Intake 0 ml  Output 1165 ml  Net -1165 ml   Filed Weights   06/02/24 0242 06/03/24 0423 06/06/24 0407  Weight: 99.8 kg 96.7 kg 96.6 kg    Examination: General exam: Appears calm and comfortable, NAD  Respiratory system: No work of breathing, symmetric chest wall expansion Cardiovascular system: S1 & S2 heard, RRR.  Gastrointestinal system: appropriately tender to palpation.  Colostomy in place, wound VAC in place.  Bowel sounds present, abdomen is not significantly distended.  Right lower quadrant drain in place Neuro: Alert and oriented. No focal neurological deficits. Extremities: Symmetric, expected ROM Skin: No rashes, lesions Psychiatry: Demonstrates appropriate judgement and insight. Mood & affect appropriate for situation.   Assessment & Plan:  Principal Problem:   SBO (small bowel obstruction) (HCC) Active Problems:   UTI (urinary tract infection)   AKI (acute kidney injury) (HCC)   HTN (hypertension)   Chronic diastolic CHF (congestive heart failure) (HCC)   Rectal cancer (HCC)   Liver lesion   Coffee ground emesis   Alcohol use   Overweight (BMI 25.0-29.9)   Malnutrition of moderate degree    SBO status postsurgical intervention Postop ileus - 7/11: Ex lap with small bowel resection and appendectomy - 7/16: Abdominal wall dehiscence, abdominal closure, wound VAC placement - NG tube removed 7/15, reinserted 7/17, removed 7/21 - CT 7/24: Pelvic abscess appreciated.  No significant obstruction or ileus appreciated - 7/25 interventional radiology placed right pelvic abscess drain - Diet advanced to soft foods today.  If tolerating diet can consider discontinuation of TPN in a.m. - General Surgery following, wound VAC management per general surgery.  Home health wound VAC orders arranged per TOC  History of rectal cancer Colostomy status New liver and lung masses -  Isolated mets to left lung, status postresection and adjuvant chemo.  Last oncology visit in 2022 lost to follow-up.  Patient's wife reports she can no longer transport him to doctor appointments - On this admission CT abdomen pelvis: Left lung base nodule as well as liver nodule.  MRI of liver and CT chest consistent with probable malignancy, metastasis versus new primary - Dr. Jacobo has been consulted and reviewed.  Reports no acute intervention at this time - Oncology has also discussed directly with patient and his wife.  They are aware that due to his poor functional status oncology cannot offer treatment - Patient's wife is currently declining hospice - Plan will be to discharge home once SBO is managed.  He will need home health and wound VAC management. - Palliative care consulted  Hyponatremia AKI Lactic acidosis - Likely prerenal secondary to the above - Resolved now - Continue to trend CMP  Acute cystitis - Status post 5 days antibiotic course.  Culture with multiple species growth  Bedbound - At baseline   Hypertension - Stable without meds currently.  Monitor closely and titrate as needed  Alcohol use disorder - Denies withdrawal history.  No signs or symptoms of withdrawal during this admission  Macrocytic anemia - B12 and folate within normal limits - Continue TPN with supplementation as above.  DVT prophylaxis: lovenox    Code Status: Limited: Do not attempt resuscitation (DNR) -DNR-LIMITED -Do Not Intubate/DNI  Disposition: Inpatient.  Still on TPN.  Discharge home when consistently tolerating PO   Consultants:  Treatment Team:  Consulting Physician: Rodolph Romano, MD  Procedures:    Antimicrobials:  Anti-infectives (From admission, onward)    Start     Dose/Rate Route Frequency Ordered Stop   05/24/24 1015  ceFAZolin  (ANCEF ) IVPB 2g/100 mL premix  Status:  Discontinued        2 g 200 mL/hr over 30 Minutes Intravenous  Once 05/24/24 0921  05/27/24 0738   05/23/24 1300  cefTRIAXone  (ROCEPHIN ) 1 g in sodium chloride  0.9 % 100 mL IVPB        1 g 200 mL/hr over 30 Minutes Intravenous Every 24 hours 05/23/24 1208 05/29/24 1235   05/21/24 1800  cefTRIAXone  (ROCEPHIN ) 1 g in sodium chloride  0.9 % 100 mL IVPB        1 g 200 mL/hr over 30 Minutes Intravenous  Once 05/21/24 1049 05/21/24 1929   05/20/24 1800  cefTRIAXone  (ROCEPHIN ) 2 g in sodium chloride  0.9 % 100 mL IVPB        2 g 200 mL/hr over 30 Minutes Intravenous  Once 05/20/24 1750 05/20/24 2000       Data Reviewed: I have personally reviewed following labs and imaging studies CBC: Recent Labs  Lab 06/01/24 0544 06/03/24 0505 06/04/24 0511 06/05/24 0505  WBC 13.2* 13.2* 11.2* 12.0*  NEUTROABS 9.7* 10.2* 8.1* 8.8*  HGB 9.9* 9.7* 9.7* 8.8*  HCT 28.9* 29.3* 29.5* 25.9*  MCV 102.5* 105.4* 104.6* 101.6*  PLT 174 179 182 205   Basic Metabolic Panel: Recent Labs  Lab 06/01/24 0544 06/02/24 0539 06/03/24 0505 06/04/24 0511 06/05/24 0505 06/06/24  0356 06/07/24 0349  NA 137  --  134* 134* 130* 134* 135  K 4.2  --  5.2* 4.9 4.6 4.5 4.5  CL 106  --  100 102 102 104 106  CO2 27  --  27 26 23 24 24   GLUCOSE 130*  --  166* 169* 208* 192* 185*  BUN 15  --  18 19 22  26* 25*  CREATININE 0.75  --  0.75 0.77 0.58* 0.70 0.70  CALCIUM 8.0*  --  8.6* 8.6* 8.3* 8.3* 8.5*  MG 2.1 2.1 2.2 2.2 2.2 2.1 2.2  PHOS 2.5 2.7 3.2  --   --  3.1  --    GFR: Estimated Creatinine Clearance: 85.6 mL/min (by C-G formula based on SCr of 0.7 mg/dL). Liver Function Tests: Recent Labs  Lab 06/03/24 0505 06/06/24 0356 06/07/24 0349  AST 34 84* 85*  ALT 16 59* 63*  ALKPHOS 101 158* 182*  BILITOT 0.3 0.7 0.6  PROT 5.6* 5.5* 6.0*  ALBUMIN 2.2* 1.9* 2.1*   CBG: Recent Labs  Lab 06/07/24 0521 06/07/24 0907 06/07/24 1007 06/07/24 1235 06/07/24 1601  GLUCAP 189* 171* 184* 167* 158*    No results found for this or any previous visit (from the past 240 hours).   Radiology  Studies: CT GUIDED PERITONEAL/RETROPERITONEAL FLUID DRAIN BY PERC CATH Result Date: 06/07/2024 CLINICAL DATA:  Small-bowel resection, 11.7 cm right pelvic fluid collection EXAM: CT GUIDED DRAINAGE OF PELVIC ABSCESS ANESTHESIA/SEDATION: Intravenous Fentanyl  50mcg and Versed  1mg  were administered by RN during a total moderate (conscious) sedation time of 15 minutes; the patient's level of consciousness and physiological / cardiorespiratory status were monitored continuously by radiology RN under my direct supervision. PROCEDURE: The procedure, risks, benefits, and alternatives were explained to the patient. Questions regarding the procedure were encouraged and answered. The patient understands and consents to the procedure. select axial scans of the pelvis for pain with the patient in the LPO position. The pelvic fluid collection was localized and an appropriate skin site was determined and marked. The operative field was prepped with chlorhexidinein a sterile fashion, and a sterile drape was applied covering the operative field. A sterile gown and sterile gloves were used for the procedure. Local anesthesia was provided with 1% Lidocaine . Under CT fluoroscopic guidance, 18 gauge trocar needle advanced to the collection. Blood tinged fluid could be aspirated. Amplatz guidewire advanced easily within the collection, position confirmed on CT. Tract dilated to facilitate placement of a 10 French pigtail drain catheter, formed dependently within the collection. 20 mL of bloody fluid were aspirated, sent for Gram stain and culture. Catheter secured externally with 0 Prolene suture and StatLock and placed to gravity drain bag. The patient tolerated the procedure well. RADIATION DOSE REDUCTION: This exam was performed according to the departmental dose-optimization program which includes automated exposure control, adjustment of the mA and/or kV according to patient size and/or use of iterative reconstruction technique.  COMPLICATIONS: None immediate FINDINGS: Pelvic fluid collection superior to the urinary bladder was localized. 10 French pigtail drain catheter placed as above. 20 mL bloody aspirate sent for Gram stain and culture. IMPRESSION: 1. Technically successful CT-guided pelvic abscess drain catheter placement. Electronically Signed   By: JONETTA Faes M.D.   On: 06/07/2024 15:03   CT ABDOMEN PELVIS W CONTRAST Result Date: 06/06/2024 CLINICAL DATA:  Recurrent non resolving ileus after small-bowel resection. EXAM: CT ABDOMEN AND PELVIS WITH CONTRAST TECHNIQUE: Multidetector CT imaging of the abdomen and pelvis was performed using the standard protocol  following bolus administration of intravenous contrast. RADIATION DOSE REDUCTION: This exam was performed according to the departmental dose-optimization program which includes automated exposure control, adjustment of the mA and/or kV according to patient size and/or use of iterative reconstruction technique. CONTRAST:  OMNIPAQUE  IOHEXOL  300 MG/ML  SOLN COMPARISON:  Most recent CT 05/20/2024 FINDINGS: Lower chest: Hypoventilatory changes in the right greater than left lower lobe. No pleural effusion. Benign calcified granuloma in the right lower lobe. Hepatobiliary: Hypodense lesion in the right lobe of the liver measures 19 mm, characterized on recent MRI. No change in size from that exam. Additional tiny hypodensity in the anterior liver is unchanged. Multiple gallstones in the gallbladder. Pneumobilia was also present on prior exam. No gallbladder inflammation. Pancreas: No ductal dilatation or inflammation. Spleen: Normal in size without focal abnormality. Adrenals/Urinary Tract: Normal adrenal glands. No hydronephrosis or renal inflammation. Right renal cyst. 9 mm calcification in the dependent urinary bladder. Mild posterior bladder wall thickening. Stomach/Bowel: Minimal hiatal hernia. The stomach is nondistended. Administered enteric contrast progresses through  the small bowel into the colon. Few prominent and mildly dilated loops of small bowel are seen in the pelvis, 3.2 cm. Appendectomy. Contrast in the colon extends to the colon just before the colostomy. Status post low anterior resection. Vascular/Lymphatic: Aortic and branch atherosclerosis. Portal vein is patent. No enlarged lymph in the abdomen or pelvis. Reproductive: Prostate is unremarkable. Other: Pelvic fluid collection measures 11.7 x 5.3 x 4.7 cm, series 2, image 61. There is some peripheral enhancement. This abuts a loop of small bowel in the pelvis. Postsurgical change of the anterior abdominal wall without subcutaneous collection. Trace free fluid in the right upper quadrant. No free air. Musculoskeletal: The bones are subjectively under mineralized. L4 and L5 compression fractures are unchanged. Mild L1 superior endplate compression fracture is unchanged. IMPRESSION: 1. Pelvic fluid collection measures 11.7 x 5.3 x 4.7 cm, suspicious for abscess 2. Few prominent but not abnormally dilated loops of small bowel in the pelvis. Administered enteric contrast progresses through the small bowel into the colon. 3. A 9 mm calcification in the dependent urinary bladder. Mild posterior bladder wall thickening. 4. Cholelithiasis. Aortic Atherosclerosis (ICD10-I70.0). Electronically Signed   By: Andrea Gasman M.D.   On: 06/06/2024 17:24    Scheduled Meds:  vitamin C   500 mg Oral BID   Chlorhexidine  Gluconate Cloth  6 each Topical Daily   [START ON 06/08/2024] enoxaparin  (LOVENOX ) injection  40 mg Subcutaneous Q24H   feeding supplement  237 mL Oral TID BM   insulin  aspart  0-15 Units Subcutaneous Q4H   ketorolac   1 drop Right Eye QID   pantoprazole  (PROTONIX ) IV  40 mg Intravenous Q12H   polyethylene glycol  17 g Oral Daily   sodium chloride  flush  10-40 mL Intracatheter Q12H   sodium chloride  flush  5 mL Intracatheter Q8H   Continuous Infusions:  TPN ADULT (ION) 80 mL/hr at 06/06/24 1812   TPN  ADULT (ION)       LOS: 18 days  MDM: Patient is high risk for one or more organ failure.  They necessitate ongoing hospitalization for continued IV therapies and subsequent lab monitoring. Total time spent interpreting labs and vitals, reviewing the medical record, coordinating care amongst consultants and care team members, directly assessing and discussing care with the patient and/or family: 55 min  Joella Saefong, DO [facility]  To contact the attending physician between 7A-7P please use Epic Chat. To contact the covering physician during after  hours 7P-7A, please review Amion.  06/07/2024, 4:13 PM   *This document has been created with the assistance of dictation software. Please excuse typographical errors. *

## 2024-06-07 NOTE — Consult Note (Signed)
 PHARMACY - TOTAL PARENTERAL NUTRITION CONSULT NOTE   Indication: Prolonged ileus  Patient Measurements: Height: 5' 7 (170.2 cm) Weight: 96.6 kg (212 lb 15.4 oz) IBW/kg (Calculated) : 66.1 TPN AdjBW (KG): 71.1 Body mass index is 33.35 kg/m.  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 Admitted with small bowel obstruction. Small bowel resection and appendectomy on 05/24/2024. Noted N/V and abd distention on 7/16. Revision done 7/16 in the evening. Noted prolonged ileus and pharmacy consulted to initiate and manage TPN.  Glucose / Insulin : No hx of diabetes,A1C 4.8% BG 163-189.  18 units of insulin  given in last 24 hours Increase SSI to moderate scale 7/23 Add insulin  10 units to TPN Electrolytes: WNL Renal: Scr < 1 (stable) Hepatic: AST 84>85, ALT 59>63, will trend LFTs Intake / Output; MIVF:  Intake/Output Summary (Last 24 hours) at 06/07/2024 0757 Last data filed at 06/07/2024 0533 Gross per 24 hour  Intake 240 ml  Output 2025 ml  Net -1785 ml    GI Imaging: CT abd/pel 7/24: Pelvic fluid collection measures 11.7 x 5.3 x 4.7 cm, suspicious for abscess GI Surgeries / Procedures: laparotomy with small bowel resection and appendectomy on 05/24/2024   7/25 to have percutaneous drainage of pelvis fluid collection  Central access: 7/17 TPN start date: 7/17  Nutritional Goals: Goal TPN rate is 80 mL/hr (provides 109 g of protein and 1898 kcals per day)  RD Assessment: Estimated Needs Total Energy Estimated Needs: 1900-2200kcal/day Total Protein Estimated Needs: 95-110g/day Total Fluid Estimated Needs: 1.7-2.0L/day  Current Nutrition: NPO  Plan:  Continue TPN at 80 mL/hr at 1800 Electrolytes in TPN: Na 25mEq/L , K 8mEq/L, Ca 56mEq/L, Mg 44mEq/L, and Phos 15mmol/L. Cl:Ac 1:1 Add standard MVI  and trace elements to TPN Add thiamine  and folic acid  to TPN daily until able to take PO Add Regular insulin  10 units to TPN Continue moderate q4h SSI and adjust as needed  Monitor TPN labs on Mon/Thurs, daily until stable   Mose Blew, PharmD, BCPS 06/07/2024 7:57 AM

## 2024-06-08 DIAGNOSIS — K56609 Unspecified intestinal obstruction, unspecified as to partial versus complete obstruction: Secondary | ICD-10-CM | POA: Diagnosis not present

## 2024-06-08 LAB — CBC
HCT: 25.4 % — ABNORMAL LOW (ref 39.0–52.0)
Hemoglobin: 8.5 g/dL — ABNORMAL LOW (ref 13.0–17.0)
MCH: 34.1 pg — ABNORMAL HIGH (ref 26.0–34.0)
MCHC: 33.5 g/dL (ref 30.0–36.0)
MCV: 102 fL — ABNORMAL HIGH (ref 80.0–100.0)
Platelets: 227 K/uL (ref 150–400)
RBC: 2.49 MIL/uL — ABNORMAL LOW (ref 4.22–5.81)
RDW: 13.9 % (ref 11.5–15.5)
WBC: 12.4 K/uL — ABNORMAL HIGH (ref 4.0–10.5)
nRBC: 0 % (ref 0.0–0.2)

## 2024-06-08 LAB — GLUCOSE, CAPILLARY
Glucose-Capillary: 132 mg/dL — ABNORMAL HIGH (ref 70–99)
Glucose-Capillary: 134 mg/dL — ABNORMAL HIGH (ref 70–99)
Glucose-Capillary: 150 mg/dL — ABNORMAL HIGH (ref 70–99)
Glucose-Capillary: 153 mg/dL — ABNORMAL HIGH (ref 70–99)
Glucose-Capillary: 153 mg/dL — ABNORMAL HIGH (ref 70–99)
Glucose-Capillary: 172 mg/dL — ABNORMAL HIGH (ref 70–99)

## 2024-06-08 LAB — MAGNESIUM: Magnesium: 2.2 mg/dL (ref 1.7–2.4)

## 2024-06-08 MED ORDER — THIAMINE MONONITRATE 100 MG PO TABS
100.0000 mg | ORAL_TABLET | Freq: Every day | ORAL | Status: DC
  Administered 2024-06-09 – 2024-06-12 (×4): 100 mg via ORAL
  Filled 2024-06-08 (×4): qty 1

## 2024-06-08 MED ORDER — FOLIC ACID 1 MG PO TABS
1.0000 mg | ORAL_TABLET | Freq: Every day | ORAL | Status: DC
  Administered 2024-06-09 – 2024-06-12 (×4): 1 mg via ORAL
  Filled 2024-06-08 (×4): qty 1

## 2024-06-08 MED ORDER — TRAVASOL 10 % IV SOLN
INTRAVENOUS | Status: DC
  Filled 2024-06-08: qty 547.2

## 2024-06-08 NOTE — Progress Notes (Signed)
 Patient ID: Stephen [name], male   DOB: [birth year], 78 y.o.   MRN: [id number]     SURGICAL PROGRESS NOTE   Hospital Day(s): 19.   Interval History: Patient seen and examined, no acute events or new complaints overnight.   Patient denies any worsening complaints.  He still with decreased appetite but eating solid food now.  He is willing to eat food from home.  Vital signs in last 24 hours: [min-max] current  Temp:  [78 F (25.6 C)-98.7 F (37.1 C)] 98.2 F (36.8 C) (07/26 0351) Pulse Rate:  [71-98] 88 (07/26 0351) Resp:  [16-24] 16 (07/26 0351) BP: (89-129)/(49-80) 104/63 (07/26 0351) SpO2:  [92 %-97 %] 94 % (07/26 0351) Weight:  [88.8 kg] 88.8 kg (07/26 0356)     Height: 5' 7 (170.2 cm) Weight: 88.8 kg BMI (Calculated): 30.65   Physical Exam:  Constitutional: alert, cooperative and no distress  Respiratory: breathing non-labored at rest  Cardiovascular: regular rate and sinus rhythm  Gastrointestinal: soft, non-tender, and non-distended.  Colostomy pink bladder.  Wound VAC in place.  Labs:     Latest Ref Rng & Units 06/08/2024    3:25 AM 06/05/2024    5:05 AM 06/04/2024    5:11 AM  CBC  WBC 4.0 - 10.5 K/uL 12.4  12.0  11.2   Hemoglobin 13.0 - 17.0 g/dL 8.5  8.8  9.7   Hematocrit 39.0 - 52.0 % 25.4  25.9  29.5   Platelets 150 - 400 K/uL 227  205  182       Latest Ref Rng & Units 06/07/2024    3:49 AM 06/06/2024    3:56 AM 06/05/2024    5:05 AM  CMP  Glucose 70 - 99 mg/dL 814  807  791   BUN 8 - 23 mg/dL 25  26  22    Creatinine 0.61 - 1.24 mg/dL 9.29  9.29  9.41   Sodium 135 - 145 mmol/L 135  134  130   Potassium 3.5 - 5.1 mmol/L 4.5  4.5  4.6   Chloride 98 - 111 mmol/L 106  104  102   CO2 22 - 32 mmol/L 24  24  23    Calcium 8.9 - 10.3 mg/dL 8.5  8.3  8.3   Total Protein 6.5 - 8.1 g/dL 6.0  5.5    Total Bilirubin 0.0 - 1.2 mg/dL 0.6  0.7    Alkaline Phos 38 - 126 U/L 182  158    AST 15 - 41 U/L 85  84    ALT 0 - 44 U/L 63  59      Imaging studies: No new pertinent  imaging studies   Assessment/Plan:  Small bowel obstruction - Status post lysis of adhesion and small bowel resection POD#15 - Initially developed postop ileus then got bowel function return.  Recurrent ileus with dehiscence of the abdominal wall wound with eviscerated omental fat s/p revision and closure of the abdominal wall POD#10 - Tolerating soft diet.  Eating small amounts more frequently.  Having ostomy output. - Will recommend to decrease TPN to half today with planning on discontinuing TPN tomorrow.  Pelvic hematoma - Status post percutaneous drainage yesterday - Output getting more clear, serosanguineous. - Will continue monitoring drain output  If patient continue current progress, I think that this will be realistic to plan for discharge early during the week.  He will need home health RN for wound VAC changes 3 times per week, drain care.  Lucas Petrin, MD

## 2024-06-08 NOTE — Plan of Care (Signed)

## 2024-06-08 NOTE — Consult Note (Signed)
 PHARMACY - TOTAL PARENTERAL NUTRITION CONSULT NOTE   Indication: Prolonged ileus  Patient Measurements: Height: 5' 7 (170.2 cm) Weight: 88.8 kg (195 lb 12.3 oz) IBW/kg (Calculated) : 66.1 TPN AdjBW (KG): 71.1 Body mass index is 30.66 kg/m.  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 Admitted with small bowel obstruction. Small bowel resection and appendectomy on 05/24/2024. Noted N/V and abd distention on 7/16. Revision done 7/16 in the evening. Noted prolonged ileus and pharmacy consulted to initiate and manage TPN.  Glucose / Insulin : No hx of diabetes,A1C 4.8% BG 150-184 18 units of insulin  given in last 24 hours moderate SSI (as of 7/23) Add insulin  10 units to TPN Electrolytes: WNL Renal: Scr < 1 (stable) Hepatic: AST 84>85, ALT 59>63, will trend LFTs Intake / Output; MIVF:  Intake/Output Summary (Last 24 hours) at 06/08/2024 1009 Last data filed at 06/08/2024 9570 Gross per 24 hour  Intake 619.89 ml  Output 750 ml  Net -130.11 ml    GI Imaging: CT abd/pel 7/24: Pelvic fluid collection measures 11.7 x 5.3 x 4.7 cm, suspicious for abscess GI Surgeries / Procedures: laparotomy with small bowel resection and appendectomy on 05/24/2024   7/25 to have percutaneous drainage of pelvis fluid collection  Central access: 7/17 TPN start date: 7/17  Nutritional Goals: Goal TPN rate is 80 mL/hr (provides 109 g of protein and 1898 kcals per day)  RD Assessment: Estimated Needs Total Energy Estimated Needs: 1900-2200kcal/day Total Protein Estimated Needs: 95-110g/day Total Fluid Estimated Needs: 1.7-2.0L/day  Current Nutrition: soft diet  Plan:  Decrease TPN to half-rate of 40 mL/hr at 1800 on 7/26 Electrolytes in TPN: Na 75mEq/L , K 58mEq/L, Ca 11mEq/L, Mg 38mEq/L, and Phos 15mmol/L. Cl:Ac  1:1 Add standard MVI and trace elements to TPN thiamine  and folic acid  changed to oral 7/26  Add Regular insulin  10 units to TPN Continue moderate q4h SSI and adjust as needed  Monitor TPN labs on Mon/Thurs, daily until stable   Allean Haas PharmD Clinical Pharmacist 06/08/2024

## 2024-06-08 NOTE — Progress Notes (Signed)
 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  He was admitted for small bowel obstruction.  SBO was persistent despite conservative management.  General surgery took the patient for exploratory laparotomy on 7/11.  He underwent small bowel resection and appendectomy. Postoperative course has been further complicated by ileus.  He went back to the ER on 7/16 for reopening of recent laparotomy and closure of abdominal wall.  Patient has been on TPN.  CT scan also reveals hepatic mass which was thought to be malignancy, metastasis versus primary.  Oncology was consulted and has discussed this with the patient and his wife.  They are both aware that this is likely recurrent cancer but since the patient is bedbound at baseline and thus has poor functional status, there are no cancer therapies available to him at this time.  Patient is not interested in pursuing a biopsy of this mass.  Patient's wife reports she is not ready for hospice.  Currently they are planning to manage his SBO and discharge home when he is surgically cleared.  Subjective: Patient is doing better today. He has been tolerating his soft food diet.  He is interested in getting food from home.  No nausea or vomiting this morning.  Abdominal pain well-controlled.   Objective: Vitals:   06/07/24 1538 06/07/24 1930 06/08/24 0351 06/08/24 0356  BP: (!) 114/56 (!) 89/49 104/63   Pulse: 96 98 88   Resp: 16 16 16    Temp: 98.7 F (37.1 C) 98.3 F (36.8 C) 98.2 F (36.8 C)   TempSrc:  Oral    SpO2: 96%  94% 94%   Weight:    88.8 kg  Height:        Intake/Output Summary (Last 24 hours) at 06/08/2024 1321 Last data filed at 06/08/2024 1206 Gross per 24 hour  Intake 619.89 ml  Output 1210 ml  Net -590.11 ml   Filed Weights   06/03/24 0423 06/06/24 0407 06/08/24 0356  Weight: 96.7 kg 96.6 kg 88.8 kg    Examination: General exam: Appears calm and comfortable, NAD  Respiratory system: No work of breathing, symmetric chest wall expansion Cardiovascular system: S1 & S2 heard, RRR.  Gastrointestinal system: appropriately tender to palpation.  Colostomy in place, wound VAC in place.  Bowel sounds present, abdomen is not significantly distended.  Right lower quadrant drain in place.  Ostomy in place Some soft stool in bag Neuro: Alert and oriented. No focal neurological deficits. Extremities: Symmetric, expected ROM Skin: No rashes, lesions Psychiatry: Demonstrates appropriate judgement and insight. Mood & affect appropriate for situation.   Assessment & Plan:  Principal Problem:   SBO (small bowel obstruction) (HCC) Active Problems:   UTI (urinary tract infection)   AKI (acute kidney injury) (HCC)   HTN (hypertension)   Chronic diastolic CHF (congestive heart failure) (HCC)   Rectal cancer (HCC)   Liver lesion   Coffee ground emesis   Alcohol use   Overweight (BMI 25.0-29.9)   Malnutrition of moderate degree    SBO status postsurgical intervention Postop ileus Pelvic hematoma - 7/11: Ex lap with small bowel resection  and appendectomy - 7/16: Abdominal wall dehiscence, abdominal closure, wound VAC placement - NG tube removed 7/15, reinserted 7/17, removed 7/21 - CT 7/24: Pelvic abscess appreciated.  No significant obstruction or ileus appreciated - 7/25 interventional radiology placed right pelvic abscess drain - Tolerating soft diet. - Decreasing TPN to half, will continue to decrease his p.o. intake increases. - TPN with insulin  per pharmacy - General Surgery following,  wound VAC management per general surgery.  Home health wound VAC orders arranged per TOC, planning for changes 3 times per week  History of rectal cancer Colostomy status New liver and lung masses - Isolated mets to left lung, status postresection and adjuvant chemo.  Last oncology visit in 2022 lost to follow-up.  Patient's wife reports she can no longer transport him to doctor appointments - On this admission CT abdomen pelvis: Left lung base nodule as well as liver nodule.  MRI of liver and CT chest consistent with probable malignancy, metastasis versus new primary - Dr. Jacobo has been consulted and reviewed.  Reports no acute intervention at this time - Oncology has also discussed directly with patient and his wife.  They are aware that due to his poor functional status oncology cannot offer treatment - Patient's wife is currently declining hospice - Plan will be to discharge home once SBO is managed.  He will need home health and wound VAC management. - Palliative care consulted  Hyponatremia AKI Lactic acidosis - Likely prerenal secondary to the above - Resolved now - Continue to trend CMP.  Correct electrolytes as needed  Acute cystitis - Status post 5 days antibiotic course.  Culture with multiple species growth  Bedbound - At baseline   Hypertension - Stable without meds currently.  Monitor closely and titrate as needed  Alcohol use disorder - Denies withdrawal history.  No signs or symptoms of withdrawal during this admission  Macrocytic anemia - B12 and folate within normal limits - Continue TPN with supplementation as above.  DVT prophylaxis: lovenox    Code Status: Limited: Do not attempt resuscitation (DNR) -DNR-LIMITED -Do Not Intubate/DNI  Disposition: Inpatient.  Still on TPN.  Discharge home when consistently tolerating PO   Consultants:  Treatment Team:  Consulting Physician: Rodolph Romano, MD  Procedures:    Antimicrobials:   Anti-infectives (From admission, onward)    Start     Dose/Rate Route Frequency Ordered Stop   05/24/24 1015  ceFAZolin  (ANCEF ) IVPB 2g/100 mL premix  Status:  Discontinued        2 g 200 mL/hr over 30 Minutes Intravenous  Once 05/24/24 0921 05/27/24 0738   05/23/24 1300  cefTRIAXone  (ROCEPHIN ) 1 g in sodium chloride  0.9 % 100 mL IVPB        1 g 200 mL/hr over 30 Minutes Intravenous Every 24 hours 05/23/24 1208 05/29/24 1235   05/21/24 1800  cefTRIAXone  (ROCEPHIN ) 1 g in sodium chloride  0.9 % 100 mL IVPB        1 g 200 mL/hr over 30 Minutes Intravenous  Once 05/21/24 1049 05/21/24 1929   05/20/24 1800  cefTRIAXone  (ROCEPHIN ) 2 g in sodium chloride  0.9 % 100 mL IVPB        2 g 200 mL/hr over 30 Minutes Intravenous  Once 05/20/24 1750 05/20/24 2000       Data Reviewed: I have personally reviewed following labs and imaging studies CBC: Recent Labs  Lab 06/03/24 0505 06/04/24 0511 06/05/24 0505 06/08/24 0325  WBC 13.2* 11.2* 12.0* 12.4*  NEUTROABS 10.2* 8.1* 8.8*  --  HGB 9.7* 9.7* 8.8* 8.5*  HCT 29.3* 29.5* 25.9* 25.4*  MCV 105.4* 104.6* 101.6* 102.0*  PLT 179 182 205 227   Basic Metabolic Panel: Recent Labs  Lab 06/02/24 0539 06/03/24 0505 06/04/24 0511 06/05/24 0505 06/06/24 0356 06/07/24 0349 06/08/24 0325  NA  --  134* 134* 130* 134* 135  --   K  --  5.2* 4.9 4.6 4.5 4.5  --   CL  --  100 102 102 104 106  --   CO2  --  27 26 23 24 24   --   GLUCOSE  --  166* 169* 208* 192* 185*  --   BUN  --  18 19 22  26* 25*  --   CREATININE  --  0.75 0.77 0.58* 0.70 0.70  --   CALCIUM  --  8.6* 8.6* 8.3* 8.3* 8.5*  --   MG 2.1 2.2 2.2 2.2 2.1 2.2 2.2  PHOS 2.7 3.2  --   --  3.1  --   --    GFR: Estimated Creatinine Clearance: 82.3 mL/min (by C-G formula based on SCr of 0.7 mg/dL). Liver Function Tests: Recent Labs  Lab 06/03/24 0505 06/06/24 0356 06/07/24 0349  AST 34 84* 85*  ALT 16 59* 63*  ALKPHOS 101 158* 182*  BILITOT 0.3 0.7 0.6  PROT 5.6* 5.5* 6.0*   ALBUMIN 2.2* 1.9* 2.1*   CBG: Recent Labs  Lab 06/07/24 1928 06/07/24 2316 06/08/24 0353 06/08/24 0759 06/08/24 1159  GLUCAP 170* 155* 153* 150* 172*    Recent Results (from the past 240 hours)  Aerobic/Anaerobic Culture w Gram Stain (surgical/deep wound)     Status: None (Preliminary result)   Collection Time: 06/07/24 11:03 AM   Specimen: Abscess  Result Value Ref Range Status   Specimen Description   Final    ABSCESS Performed at [facility], [address], [place], [place] [address]    Special Requests   Final     CT PELVIC DRAIN Performed at [facility], [address] [place], [place] [address]    Gram Stain   Final    ABUNDANT WBC PRESENT, PREDOMINANTLY PMN FEW GRAM POSITIVE COCCI IN CHAINS RARE GRAM POSITIVE RODS    Culture   Final    CULTURE REINCUBATED FOR BETTER GROWTH Performed at [facility], [address] [place], [place] [address]    Report Status PENDING  Incomplete     Radiology Studies: CT GUIDED PERITONEAL/RETROPERITONEAL FLUID DRAIN BY PERC CATH Result Date: 06/07/2024 CLINICAL DATA:  Small-bowel resection, 11.7 cm right pelvic fluid collection EXAM: CT GUIDED DRAINAGE OF PELVIC ABSCESS ANESTHESIA/SEDATION: Intravenous Fentanyl  50mcg and Versed  1mg  were administered by RN during a total moderate (conscious) sedation time of 15 minutes; the patient's level of consciousness and physiological / cardiorespiratory status were monitored continuously by radiology RN under my direct supervision. PROCEDURE: The procedure, risks, benefits, and alternatives were explained to the patient. Questions regarding the procedure were encouraged and answered. The patient understands and consents to the procedure. select axial scans of the pelvis for pain with the patient in the LPO position. The pelvic fluid collection was localized and an appropriate skin site was determined and marked. The operative field was prepped with chlorhexidinein a  sterile fashion, and a sterile drape was applied covering the operative field. A sterile gown and sterile gloves were used for the procedure. Local anesthesia was provided with 1% Lidocaine . Under CT fluoroscopic guidance, 18 gauge trocar needle advanced to the  collection. Blood tinged fluid could be aspirated. Amplatz guidewire advanced easily within the collection, position confirmed on CT. Tract dilated to facilitate placement of a 10 French pigtail drain catheter, formed dependently within the collection. 20 mL of bloody fluid were aspirated, sent for Gram stain and culture. Catheter secured externally with 0 Prolene suture and StatLock and placed to gravity drain bag. The patient tolerated the procedure well. RADIATION DOSE REDUCTION: This exam was performed according to the departmental dose-optimization program which includes automated exposure control, adjustment of the mA and/or kV according to patient size and/or use of iterative reconstruction technique. COMPLICATIONS: None immediate FINDINGS: Pelvic fluid collection superior to the urinary bladder was localized. 10 French pigtail drain catheter placed as above. 20 mL bloody aspirate sent for Gram stain and culture. IMPRESSION: 1. Technically successful CT-guided pelvic abscess drain catheter placement. Electronically Signed   By: JONETTA Faes M.D.   On: 06/07/2024 15:03   CT ABDOMEN PELVIS W CONTRAST Result Date: 06/06/2024 CLINICAL DATA:  Recurrent non resolving ileus after small-bowel resection. EXAM: CT ABDOMEN AND PELVIS WITH CONTRAST TECHNIQUE: Multidetector CT imaging of the abdomen and pelvis was performed using the standard protocol following bolus administration of intravenous contrast. RADIATION DOSE REDUCTION: This exam was performed according to the departmental dose-optimization program which includes automated exposure control, adjustment of the mA and/or kV according to patient size and/or use of iterative reconstruction technique.  CONTRAST:  OMNIPAQUE  IOHEXOL  300 MG/ML  SOLN COMPARISON:  Most recent CT 05/20/2024 FINDINGS: Lower chest: Hypoventilatory changes in the right greater than left lower lobe. No pleural effusion. Benign calcified granuloma in the right lower lobe. Hepatobiliary: Hypodense lesion in the right lobe of the liver measures 19 mm, characterized on recent MRI. No change in size from that exam. Additional tiny hypodensity in the anterior liver is unchanged. Multiple gallstones in the gallbladder. Pneumobilia was also present on prior exam. No gallbladder inflammation. Pancreas: No ductal dilatation or inflammation. Spleen: Normal in size without focal abnormality. Adrenals/Urinary Tract: Normal adrenal glands. No hydronephrosis or renal inflammation. Right renal cyst. 9 mm calcification in the dependent urinary bladder. Mild posterior bladder wall thickening. Stomach/Bowel: Minimal hiatal hernia. The stomach is nondistended. Administered enteric contrast progresses through the small bowel into the colon. Few prominent and mildly dilated loops of small bowel are seen in the pelvis, 3.2 cm. Appendectomy. Contrast in the colon extends to the colon just before the colostomy. Status post low anterior resection. Vascular/Lymphatic: Aortic and branch atherosclerosis. Portal vein is patent. No enlarged lymph in the abdomen or pelvis. Reproductive: Prostate is unremarkable. Other: Pelvic fluid collection measures 11.7 x 5.3 x 4.7 cm, series 2, image 61. There is some peripheral enhancement. This abuts a loop of small bowel in the pelvis. Postsurgical change of the anterior abdominal wall without subcutaneous collection. Trace free fluid in the right upper quadrant. No free air. Musculoskeletal: The bones are subjectively under mineralized. L4 and L5 compression fractures are unchanged. Mild L1 superior endplate compression fracture is unchanged. IMPRESSION: 1. Pelvic fluid collection measures 11.7 x 5.3 x 4.7 cm, suspicious for  abscess 2. Few prominent but not abnormally dilated loops of small bowel in the pelvis. Administered enteric contrast progresses through the small bowel into the colon. 3. A 9 mm calcification in the dependent urinary bladder. Mild posterior bladder wall thickening. 4. Cholelithiasis. Aortic Atherosclerosis (ICD10-I70.0). Electronically Signed   By: Andrea Gasman M.D.   On: 06/06/2024 17:24    Scheduled Meds:  vitamin C   500  mg Oral BID   Chlorhexidine  Gluconate Cloth  6 each Topical Daily   enoxaparin  (LOVENOX ) injection  40 mg Subcutaneous Q24H   feeding supplement  237 mL Oral TID BM   [START ON 06/09/2024] folic acid   1 mg Oral Daily   insulin  aspart  0-15 Units Subcutaneous Q4H   ketorolac   1 drop Right Eye QID   pantoprazole  (PROTONIX ) IV  40 mg Intravenous Q12H   polyethylene glycol  17 g Oral Daily   sodium chloride  flush  10-40 mL Intracatheter Q12H   sodium chloride  flush  5 mL Intracatheter Q8H   [START ON 06/09/2024] thiamine   100 mg Oral Daily   Continuous Infusions:  TPN ADULT (ION) 80 mL/hr at 06/08/24 0011   TPN ADULT (ION)       LOS: 19 days  MDM: Patient is high risk for one or more organ failure.  They necessitate ongoing hospitalization for continued IV therapies and subsequent lab monitoring. Total time spent interpreting labs and vitals, reviewing the medical record, coordinating care amongst consultants and care team members, directly assessing and discussing care with the patient and/or family: 55 min  Sinthia Karabin, DO [facility]  To contact the attending physician between 7A-7P please use Epic Chat. To contact the covering physician during after hours 7P-7A, please review Amion.  06/08/2024, 1:21 PM   *This document has been created with the assistance of dictation software. Please excuse typographical errors. *

## 2024-06-09 DIAGNOSIS — K56609 Unspecified intestinal obstruction, unspecified as to partial versus complete obstruction: Secondary | ICD-10-CM | POA: Diagnosis not present

## 2024-06-09 LAB — GLUCOSE, CAPILLARY
Glucose-Capillary: 114 mg/dL — ABNORMAL HIGH (ref 70–99)
Glucose-Capillary: 116 mg/dL — ABNORMAL HIGH (ref 70–99)
Glucose-Capillary: 113 mg/dL — ABNORMAL HIGH (ref 70–99)
Glucose-Capillary: 113 mg/dL — ABNORMAL HIGH (ref 70–99)

## 2024-06-09 LAB — MAGNESIUM: Magnesium: 2.2 mg/dL (ref 1.7–2.4)

## 2024-06-09 MED ORDER — INSULIN ASPART 100 UNIT/ML IJ SOLN
0.0000 [IU] | Freq: Three times a day (TID) | INTRAMUSCULAR | Status: DC
  Filled 2024-06-09: qty 1

## 2024-06-09 NOTE — Plan of Care (Signed)

## 2024-06-09 NOTE — Progress Notes (Signed)
 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  He was admitted for small bowel obstruction.  SBO was persistent despite conservative management.  General surgery took the patient for exploratory laparotomy on 7/11.  He underwent small bowel resection and appendectomy. Postoperative course has been further complicated by ileus.  He went back to the ER on 7/16 for reopening of recent laparotomy and closure of abdominal wall.  Patient has been on TPN.  CT scan also reveals hepatic mass which was thought to be malignancy, metastasis versus primary.  Oncology was consulted and has discussed this with the patient and his wife.  They are both aware that this is likely recurrent cancer but since the patient is bedbound at baseline and thus has poor functional status, there are no cancer therapies available to him at this time.  Patient is not interested in pursuing a biopsy of this mass.  Patient's wife reports she is not ready for hospice.  Currently they are planning to manage his SBO and discharge home when he is surgically cleared.  Subjective: Still tolerating diet today.  No nausea or vomiting.  Good spirits.   Objective: Vitals:   06/08/24 0356 06/08/24 2015 06/09/24 0359 06/09/24 0813  BP:  (!) 105/59 (!) 93/58 107/64  Pulse:  88 89 83  Resp:  16 16 18   Temp:  98.1 F (36.7 C) 98.3 F (36.8 C) 98.2 F (36.8 C)  TempSrc:      SpO2:  94% 93% 94%  Weight: 88.8 kg     Height:        Intake/Output Summary (Last 24 hours) at 06/09/2024 1233 Last data filed at  06/09/2024 1100 Gross per 24 hour  Intake 1159.01 ml  Output 575 ml  Net 584.01 ml   Filed Weights   06/03/24 0423 06/06/24 0407 06/08/24 0356  Weight: 96.7 kg 96.6 kg 88.8 kg    Examination: General exam: Appears calm and comfortable, NAD  Respiratory system: No work of breathing, symmetric chest wall expansion Cardiovascular system: S1 & S2 heard, RRR.  Gastrointestinal system: appropriately tender to palpation.  Colostomy in place, wound VAC in place.  Bowel sounds present, abdomen is not significantly distended.  Right lower quadrant drain in place.  Ostomy in place Some soft stool in bag Neuro: Alert and oriented. No focal neurological deficits. Extremities: Symmetric, expected ROM Skin: No rashes, lesions Psychiatry: Demonstrates appropriate judgement and insight. Mood & affect appropriate for situation.   Assessment & Plan:  Principal Problem:   SBO (small bowel obstruction) (HCC) Active Problems:   UTI (urinary tract infection)   AKI (acute kidney injury) (HCC)   HTN (hypertension)   Chronic diastolic CHF (congestive heart failure) (HCC)   Rectal cancer (HCC)   Liver lesion   Coffee ground emesis   Alcohol use   Overweight (BMI 25.0-29.9)   Malnutrition of moderate degree    SBO status postsurgical intervention Postop ileus Pelvic hematoma - 7/11: Ex lap with small bowel resection and appendectomy - 7/16: Abdominal wall dehiscence, abdominal closure, wound VAC placement - NG tube removed 7/15, reinserted 7/17, removed 7/21 -  CT 7/24: Pelvic abscess appreciated.  No significant obstruction or ileus appreciated - 7/25 interventional radiology placed right pelvic abscess drain - Tolerating diet now.  Will discontinue TPN - General Surgery following, wound VAC management per general surgery.  Home health wound VAC orders arranged per TOC, planning for changes 3 times per week  History of rectal cancer Colostomy status New liver and lung masses - Isolated mets to  left lung, status postresection and adjuvant chemo.  Last oncology visit in 2022 lost to follow-up.  Patient's wife reports she can no longer transport him to doctor appointments - On this admission CT abdomen pelvis: Left lung base nodule as well as liver nodule.  MRI of liver and CT chest consistent with probable malignancy, metastasis versus new primary - Dr. Jacobo has been consulted and reviewed.  Reports no acute intervention at this time - Oncology has also discussed directly with patient and his wife.  They are aware that due to his poor functional status oncology cannot offer treatment - Patient's wife is currently declining hospice - Plan will be to discharge home once SBO is managed.  He will need home health and wound VAC management. - Palliative care consulted  Hyponatremia AKI Lactic acidosis - Likely prerenal secondary to the above - Resolved now - Continue to trend CMP.  Correct electrolytes as needed  Acute cystitis - Status post 5 days antibiotic course.  Culture with multiple species growth  Bedbound - At baseline   Hypertension - Stable without meds currently.  Monitor closely and titrate as needed  Alcohol use disorder - Denies withdrawal history.  No signs or symptoms of withdrawal during this admission  Macrocytic anemia - B12 and folate within normal limits  DVT prophylaxis: lovenox    Code Status: Limited: Do not attempt resuscitation (DNR) -DNR-LIMITED -Do Not Intubate/DNI  Disposition: Inpatient, tolerating diet.  Discontinue TPN today.  Continue to monitor for signs of ileus.  Still with wound VAC.  Will discharge home with home health possibly in next 48 hours.  Consultants:  Treatment Team:  Consulting Physician: Rodolph Romano, MD  Procedures:    Antimicrobials:  Anti-infectives (From admission, onward)    Start     Dose/Rate Route Frequency Ordered Stop   05/24/24 1015  ceFAZolin  (ANCEF ) IVPB 2g/100 mL premix  Status:  Discontinued         2 g 200 mL/hr over 30 Minutes Intravenous  Once 05/24/24 0921 05/27/24 0738   05/23/24 1300  cefTRIAXone  (ROCEPHIN ) 1 g in sodium chloride  0.9 % 100 mL IVPB        1 g 200 mL/hr over 30 Minutes Intravenous Every 24 hours 05/23/24 1208 05/29/24 1235   05/21/24 1800  cefTRIAXone  (ROCEPHIN ) 1 g in sodium chloride  0.9 % 100 mL IVPB        1 g 200 mL/hr over 30 Minutes Intravenous  Once 05/21/24 1049 05/21/24 1929   05/20/24 1800  cefTRIAXone  (ROCEPHIN ) 2 g in sodium chloride  0.9 % 100 mL IVPB        2 g 200 mL/hr over 30 Minutes Intravenous  Once 05/20/24 1750 05/20/24 2000       Data Reviewed: I have personally reviewed following labs and imaging studies CBC: Recent Labs  Lab 06/03/24 0505 06/04/24 0511 06/05/24 0505 06/08/24 0325  WBC 13.2* 11.2* 12.0* 12.4*  NEUTROABS 10.2* 8.1* 8.8*  --   HGB 9.7* 9.7* 8.8* 8.5*  HCT 29.3* 29.5* 25.9* 25.4*  MCV 105.4* 104.6* 101.6* 102.0*  PLT 179 182  205 227   Basic Metabolic Panel: Recent Labs  Lab 06/03/24 0505 06/04/24 0511 06/05/24 0505 06/06/24 0356 06/07/24 0349 06/08/24 0325 06/09/24 0559  NA 134* 134* 130* 134* 135  --   --   K 5.2* 4.9 4.6 4.5 4.5  --   --   CL 100 102 102 104 106  --   --   CO2 27 26 23 24 24   --   --   GLUCOSE 166* 169* 208* 192* 185*  --   --   BUN 18 19 22  26* 25*  --   --   CREATININE 0.75 0.77 0.58* 0.70 0.70  --   --   CALCIUM 8.6* 8.6* 8.3* 8.3* 8.5*  --   --   MG 2.2 2.2 2.2 2.1 2.2 2.2 2.2  PHOS 3.2  --   --  3.1  --   --   --    GFR: Estimated Creatinine Clearance: 82.3 mL/min (by C-G formula based on SCr of 0.7 mg/dL). Liver Function Tests: Recent Labs  Lab 06/03/24 0505 06/06/24 0356 06/07/24 0349  AST 34 84* 85*  ALT 16 59* 63*  ALKPHOS 101 158* 182*  BILITOT 0.3 0.7 0.6  PROT 5.6* 5.5* 6.0*  ALBUMIN 2.2* 1.9* 2.1*   CBG: Recent Labs  Lab 06/08/24 1554 06/08/24 2011 06/08/24 2339 06/09/24 0359 06/09/24 0814  GLUCAP 153* 132* 134* 114* 116*    Recent Results  (from the past 240 hours)  Aerobic/Anaerobic Culture w Gram Stain (surgical/deep wound)     Status: None (Preliminary result)   Collection Time: 06/07/24 11:03 AM   Specimen: Abscess  Result Value Ref Range Status   Specimen Description   Final    ABSCESS Performed at [facility], [address], [place], [place] [address]    Special Requests   Final     CT PELVIC DRAIN Performed at [facility], [address] [place], [place] [address]    Gram Stain   Final    ABUNDANT WBC PRESENT, PREDOMINANTLY PMN FEW GRAM POSITIVE COCCI IN CHAINS RARE GRAM POSITIVE RODS Performed at [facility], [address], [place], [place] [address]    Culture ABUNDANT ENTEROCOCCUS FAECIUM  Final   Report Status PENDING  Incomplete   Organism ID, Bacteria ENTEROCOCCUS FAECIUM  Final      Susceptibility   Enterococcus faecium - MIC*    AMPICILLIN  >=32 RESISTANT Resistant     VANCOMYCIN  >=32 RESISTANT Resistant     GENTAMICIN SYNERGY SENSITIVE Sensitive     * ABUNDANT ENTEROCOCCUS FAECIUM     Radiology Studies: No results found.   Scheduled Meds:  vitamin C   500 mg Oral BID   Chlorhexidine  Gluconate Cloth  6 each Topical Daily   enoxaparin  (LOVENOX ) injection  40 mg Subcutaneous Q24H   feeding supplement  237 mL Oral TID BM   folic acid   1 mg Oral Daily   insulin  aspart  0-15 Units Subcutaneous TID WC   ketorolac   1 drop Right Eye QID   pantoprazole  (PROTONIX ) IV  40 mg Intravenous Q12H   polyethylene glycol  17 g Oral Daily   sodium chloride  flush  10-40 mL Intracatheter Q12H   sodium chloride  flush  5 mL Intracatheter Q8H   thiamine   100 mg Oral Daily   Continuous Infusions:  TPN ADULT (ION) 40 mL/hr at 06/08/24 1805     LOS: 20 days  MDM: Patient is high risk for one or more organ failure.  They necessitate ongoing hospitalization for continued IV therapies and subsequent lab monitoring. Total time spent interpreting labs and vitals, reviewing the medical  record, coordinating care amongst consultants and care team members, directly assessing and discussing care with the patient and/or family: 55 min  Chloeann Alfred, DO [facility]  To contact the attending physician between 7A-7P please use Epic Chat. To contact the covering physician during after hours 7P-7A, please review Amion.  06/09/2024, 12:33 PM   *This document has been created with the assistance of dictation software. Please excuse typographical errors. *

## 2024-06-09 NOTE — Progress Notes (Signed)
 Patient ID: Stephen [name], male   DOB: [birth year], 78 y.o.   MRN: [id number]     SURGICAL PROGRESS NOTE   Hospital Day(s): 20.   Interval History: Patient seen and examined, no acute events or new complaints overnight. Patient reports feeling well.  He endorses that he ate the whole bowl of chicken and rolled from his wife.  Denies any nausea or vomiting.  Vital signs in last 24 hours: [min-max] current  Temp:  [98.1 F (36.7 C)-98.3 F (36.8 C)] 98.2 F (36.8 C) (07/27 0813) Pulse Rate:  [83-89] 83 (07/27 0813) Resp:  [16-18] 18 (07/27 0813) BP: (93-107)/(58-64) 107/64 (07/27 0813) SpO2:  [93 %-94 %] 94 % (07/27 0813)     Height: 5' 7 (170.2 cm) Weight: 88.8 kg BMI (Calculated): 30.65   Physical Exam:  Constitutional: alert, cooperative and no distress  Respiratory: breathing non-labored at rest  Cardiovascular: regular rate and sinus rhythm  Gastrointestinal: soft, non-tender, and non-distended.  Colostomy is pink and patent.  Labs:     Latest Ref Rng & Units 06/08/2024    3:25 AM 06/05/2024    5:05 AM 06/04/2024    5:11 AM  CBC  WBC 4.0 - 10.5 K/uL 12.4  12.0  11.2   Hemoglobin 13.0 - 17.0 g/dL 8.5  8.8  9.7   Hematocrit 39.0 - 52.0 % 25.4  25.9  29.5   Platelets 150 - 400 K/uL 227  205  182       Latest Ref Rng & Units 06/07/2024    3:49 AM 06/06/2024    3:56 AM 06/05/2024    5:05 AM  CMP  Glucose 70 - 99 mg/dL 814  807  791   BUN 8 - 23 mg/dL 25  26  22    Creatinine 0.61 - 1.24 mg/dL 9.29  9.29  9.41   Sodium 135 - 145 mmol/L 135  134  130   Potassium 3.5 - 5.1 mmol/L 4.5  4.5  4.6   Chloride 98 - 111 mmol/L 106  104  102   CO2 22 - 32 mmol/L 24  24  23    Calcium 8.9 - 10.3 mg/dL 8.5  8.3  8.3   Total Protein 6.5 - 8.1 g/dL 6.0  5.5    Total Bilirubin 0.0 - 1.2 mg/dL 0.6  0.7    Alkaline Phos 38 - 126 U/L 182  158    AST 15 - 41 U/L 85  84    ALT 0 - 44 U/L 63  59      Imaging studies: No new pertinent imaging studies   Assessment/Plan:  Small bowel  obstruction - Status post lysis of adhesion and small bowel resection POD#16 - Initially developed postop ileus then got bowel function return.  Recurrent ileus with dehiscence of the abdominal wall wound with eviscerated omental fat s/p revision and closure of the abdominal wall POD#11 - Tolerating soft diet.  Ate much better when wife brought food from home.  No contraindication to bring food from home. - Plan to discontinue TPN today   Pelvic hematoma - Status post percutaneous drainage  - Decreasing output - Will continue monitoring drain output   If patient continue current progress, I think that this will be realistic to plan for discharge early during the week.  He will need home health RN for wound VAC changes 3 times per week, drain care.  Lucas Petrin, MD

## 2024-06-10 DIAGNOSIS — K56609 Unspecified intestinal obstruction, unspecified as to partial versus complete obstruction: Secondary | ICD-10-CM | POA: Diagnosis not present

## 2024-06-10 LAB — COMPREHENSIVE METABOLIC PANEL WITH GFR
ALT: 74 U/L — ABNORMAL HIGH (ref 0–44)
AST: 85 U/L — ABNORMAL HIGH (ref 15–41)
Albumin: 2.1 g/dL — ABNORMAL LOW (ref 3.5–5.0)
Alkaline Phosphatase: 258 U/L — ABNORMAL HIGH (ref 38–126)
Anion gap: 7 (ref 5–15)
BUN: 23 mg/dL (ref 8–23)
CO2: 21 mmol/L — ABNORMAL LOW (ref 22–32)
Calcium: 8.4 mg/dL — ABNORMAL LOW (ref 8.9–10.3)
Chloride: 103 mmol/L (ref 98–111)
Creatinine, Ser: 0.85 mg/dL (ref 0.61–1.24)
GFR, Estimated: >60 mL/min (ref >60)
Glucose, Bld: 100 mg/dL — ABNORMAL HIGH (ref 70–99)
Potassium: 4.3 mmol/L (ref 3.5–5.1)
Sodium: 131 mmol/L — ABNORMAL LOW (ref 135–145)
Total Bilirubin: 1.1 mg/dL (ref 0.0–1.2)
Total Protein: 6.5 g/dL (ref 6.5–8.1)

## 2024-06-10 LAB — GLUCOSE, CAPILLARY
Glucose-Capillary: 88 mg/dL (ref 70–99)
Glucose-Capillary: 87 mg/dL (ref 70–99)
Glucose-Capillary: 81 mg/dL (ref 70–99)

## 2024-06-10 LAB — TRIGLYCERIDES: Triglycerides: 91 mg/dL (ref <150)

## 2024-06-10 LAB — PHOSPHORUS: Phosphorus: 3.8 mg/dL (ref 2.5–4.6)

## 2024-06-10 LAB — MAGNESIUM: Magnesium: 2.1 mg/dL (ref 1.7–2.4)

## 2024-06-10 NOTE — TOC Progression Note (Addendum)
 Transition of Care [facility]) - Progression Note    Patient Details  Name: Diante Barley MRN: [id number] Date of Birth: [birth year]  [facility] of [facility]) CM/SW Contact  [place] L Beya [place], [place] Phone Number: 06/10/2024, 10:23 AM  Clinical Narrative:     CSW confirmed HH services with [facility]. [facility] is able to provide wound care three times a week.   CSW spoke with Randine Pope, [facility], to set up services for wound vac.   3:27pm: Order for wound vac not signed. CSW reached out to [facility]. She is on vacation but left a number for someone covering [phone]. CSW advised that doctor stated that he was unable to sign due to system error. CSW provided an email where doctor could receive script. [email or web address]   Expected Discharge Plan: Home/Self Care Barriers to Discharge: Continued Medical Work up               Expected Discharge Plan and Services     Post Acute Care Choice: NA Living arrangements for the past 2 months: Single Family Home                                       Social Drivers of Health (SDOH) Interventions SDOH Screenings   Food Insecurity: No Food Insecurity (05/21/2024)  Housing: Low Risk  (05/21/2024)  Transportation Needs: No Transportation Needs (05/21/2024)  Utilities: Not At Risk (05/21/2024)  Social Connections: Moderately Integrated (05/21/2024)  Tobacco Use: Medium Risk (05/24/2024)    Readmission Risk Interventions     No data to display

## 2024-06-10 NOTE — Progress Notes (Signed)
 Referring Provider(s): Dr. Lucas Sjogren  Supervising Physician: Karalee Beat  Patient Status:  [facility] - In-pt  Chief Complaint: SBO complicated by pelvic fluid collection s/p drain placement on 7/25 seen for drain follow up  Brief History:  78 y.o. 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 Currently in the hospital after presenting with abdominal pain, abdominal distention and emesis. Found to have small bowel obstruction. Had initial exploratory laparotomy for this, due to dense adhesions from prior surgeries, on 05/24/24. He then developed dehiscence of his abdominal wall due to weak abdominal fascia and had repeat surgery to correct this on 05/29/24. Repeat CT scan today, 06/06/24 shows a pelvic fluid collection near the bladder with surgery consulting our service for image guided drain placement. Patient underwent drain placement in IR on 7/25 with Dr. JONETTA Faes.  Subjective:  Patient reports minimal improvement in how he feels since his drain placement on Friday. Denies any fevers/chills or worsening abdominal pain. Continues to have some discomfort around the drain site.   Allergies: Patient has no known allergies.  Medications: Prior to Admission medications   Medication Sig Start Date End Date Taking? Authorizing Provider  esomeprazole  (NEXIUM ) 20 MG packet Take 20 mg by mouth daily before breakfast.   Yes [provider]  hydrOXYzine (ATARAX) 10 MG tablet Take 10 mg by mouth 3 (three) times daily as needed for itching (allergies). 10/20/22  Yes [provider]  MAGNESIUM  PO Take 1 tablet by mouth daily.   Yes [provider]  Multiple Vitamin (MULTIVITAMIN WITH MINERALS) TABS tablet Take 1 tablet by mouth daily. 02/25/21  Yes Verdene Purchase, MD  Omega-3 Fatty Acids (OMEGA 3 PO) Take 1 tablet by mouth daily.   Yes [provider]  polyethylene glycol  (MIRALAX  / GLYCOLAX ) 17 g packet Take 17 g by mouth daily as needed for moderate constipation.   Yes [provider]  vitamin B-12 (CYANOCOBALAMIN ) 100 MCG tablet Take 100 mcg by mouth daily.   Yes [provider]  vitamin C  (ASCORBIC ACID ) 250 MG tablet Take 250 mg by mouth daily.   Yes [provider]  Vitamin D , Cholecalciferol , 25 MCG (1000 UT) TABS Take 1,000 Units by mouth daily.    [provider]     Vital Signs: BP 105/62 (BP Location: Left Arm)   Pulse 87   Temp 98.4 F (36.9 C)   Resp 16   Ht 5' 7 (1.702 m)   Wt 191 lb 2.2 oz (86.7 kg)   SpO2 96%   BMI 29.94 kg/m   Physical Exam Constitutional:      Appearance: Normal appearance.  Cardiovascular:     Rate and Rhythm: Normal rate.  Pulmonary:     Effort: Pulmonary effort is normal.  Abdominal:     General: Abdomen is flat.     Palpations: Abdomen is soft.     Tenderness: There is abdominal tenderness (throughout; RLQ tenderness lateral to drain site).     Comments: Ostomy in place w/ moderate stool output Wound vac in place. Appropriately tender around midline incision RLQ drain in place to gravity bag. ~10cc serosanguinous fluid in bag. Flushes/aspirates easily. Overlying dressing clean/dry  Skin:    General: Skin is warm and dry.  Neurological:     Mental Status: He is alert and oriented to person, place, and time.    Drain Location: Right Pelvis Size: Fr size: 10 Fr Date  of placement: 06/07/24  Currently to: Drain collection device: gravity 24 hour output:  Output by Drain (mL) 06/08/24 0701 - 06/08/24 1900 06/08/24 1901 - 06/09/24 0700 06/09/24 0701 - 06/09/24 1900 06/09/24 1901 - 06/10/24 0700 06/10/24 0701 - 06/10/24 1614  Closed System Drain 1 Lateral;Right Abdomen 10 Fr. 25   10   Negative Pressure Wound Therapy Abdomen 0  0 30     Interval imaging/drain manipulation:  None  Current examination: Flushes/aspirates easily.  Insertion site unremarkable. Suture  and stat lock in place. Dressed appropriately.    Labs:  CBC: Recent Labs    06/03/24 0505 06/04/24 0511 06/05/24 0505 06/08/24 0325  WBC 13.2* 11.2* 12.0* 12.4*  HGB 9.7* 9.7* 8.8* 8.5*  HCT 29.3* 29.5* 25.9* 25.4*  PLT 179 182 205 227    COAGS: Recent Labs    05/20/24 2258  INR 1.2  APTT 23*    BMP: Recent Labs    06/05/24 0505 06/06/24 0356 06/07/24 0349 06/10/24 0346  NA 130* 134* 135 131*  K 4.6 4.5 4.5 4.3  CL 102 104 106 103  CO2 23 24 24  21*  GLUCOSE 208* 192* 185* 100*  BUN 22 26* 25* 23  CALCIUM 8.3* 8.3* 8.5* 8.4*  CREATININE 0.58* 0.70 0.70 0.85  GFRNONAA >60 >60 >60 >60    LIVER FUNCTION TESTS: Recent Labs    06/03/24 0505 06/06/24 0356 06/07/24 0349 06/10/24 0346  BILITOT 0.3 0.7 0.6 1.1  AST 34 84* 85* 85*  ALT 16 59* 63* 74*  ALKPHOS 101 158* 182* 258*  PROT 5.6* 5.5* 6.0* 6.5  ALBUMIN 2.2* 1.9* 2.1* 2.1*    Assessment and Plan:  Post-operative pelvic fluid collection: Stephen [name] is a 78 y.o. male with a history of rectal cancer s/p colostomy, SBO, and recent ex-lap due to dense adhesions complicated by abdominal wall dehiscence and post-operative pelvic fluid collection. Patient underwent drain placement in IR for pelvic fluid collection on 7/25.  - ~10cc serosanguinous fluid out so far today; 10cc out the previous 24hrs as well -If output remains low, could consider rescanning and possible drain removal -Patient remains rather tender in the area of the drain -WBC 12.4 today > 12.0 5 days ago -IR will continue to follow    While Inpatient: Continue TID flushes with 5 cc NS. Record output Q shift. Dressing changes QD or PRN if soiled.  Call IR APP or on call IR MD if difficulty flushing or sudden change in drain output.  Repeat imaging/possible drain injection once output < 10 mL/QD (excluding flush material). Consideration for drain removal if output is < 10 mL/QD (excluding flush material), pending discussion with the  providing surgical service.  Discharge planning: Please contact IR APP or on call IR MD prior to patient d/c to ensure appropriate follow up plans are in place. Typically patient will follow up with IR clinic 10-14 days post d/c for repeat imaging/possible drain injection. IR scheduler will contact patient with date/time of appointment. Patient will need to flush drain QD with 5 cc NS, record output QD, dressing changes every 2-3 days or earlier if soiled.   IR will continue to follow - please call with questions or concerns.   Thank you for allowing our service to participate in Stephen [name] 's care.   Electronically Signed: Nikiah Goin M Tezra Mahr, PA-C 06/10/2024, 4:08 PM    I spent a total of 15 Minutes at the the patient's bedside AND on the patient's hospital floor or unit, greater than  50% of which was counseling/coordinating care for drain care follow up.

## 2024-06-10 NOTE — Progress Notes (Addendum)
 [facility]- General Surgery  SURGICAL PROGRESS NOTE  Hospital Day(s): 21.   Post op day(s): 12 Days Post-Op.   Interval History:  Patient seen and examined. No acute events or new complaints overnight.  Patient appears comfortable during encounter. Wife states he is eating small portions and tolerating well. Denies any nausea or vomiting    Vital signs in last 24 hours: [min-max] current  Temp:  [98.1 F (36.7 C)-98.6 F (37 C)] 98.4 F (36.9 C) (07/28 0849) Pulse Rate:  [85-89] 87 (07/28 0849) Resp:  [16] 16 (07/28 0849) BP: (105-127)/(55-62) 105/62 (07/28 0849) SpO2:  [94 %-96 %] 96 % (07/28 0849) Weight:  [86.7 kg] 86.7 kg (07/28 0345)     Height: 5' 7 (170.2 cm) Weight: 86.7 kg BMI (Calculated): 29.93   Intake/Output last 2 shifts:  07/27 0701 - 07/28 0700 In: 967.5 [P.O.:120; I.V.:842.5] Out: 890 [Urine:850; Drains:40]   Physical Exam:  Constitutional: alert, cooperative and no distress  Respiratory: breathing non-labored at rest  Cardiovascular: regular rate and sinus rhythm  Gastrointestinal: soft, non-tender, and mildly distended, gas and output in colostomy bag, wound vac intact   Labs:     Latest Ref Rng & Units 06/08/2024    3:25 AM 06/05/2024    5:05 AM 06/04/2024    5:11 AM  CBC  WBC 4.0 - 10.5 K/uL 12.4  12.0  11.2   Hemoglobin 13.0 - 17.0 g/dL 8.5  8.8  9.7   Hematocrit 39.0 - 52.0 % 25.4  25.9  29.5   Platelets 150 - 400 K/uL 227  205  182       Latest Ref Rng & Units 06/10/2024    3:46 AM 06/07/2024    3:49 AM 06/06/2024    3:56 AM  CMP  Glucose 70 - 99 mg/dL 899  814  807   BUN 8 - 23 mg/dL 23  25  26    Creatinine 0.61 - 1.24 mg/dL 9.14  9.29  9.29   Sodium 135 - 145 mmol/L 131  135  134   Potassium 3.5 - 5.1 mmol/L 4.3  4.5  4.5   Chloride 98 - 111 mmol/L 103  106  104   CO2 22 - 32 mmol/L 21  24  24    Calcium 8.9 - 10.3 mg/dL 8.4  8.5  8.3   Total Protein 6.5 - 8.1 g/dL 6.5  6.0  5.5   Total Bilirubin 0.0 - 1.2 mg/dL 1.1  0.6  0.7    Alkaline Phos 38 - 126 U/L 258  182  158   AST 15 - 41 U/L 85  85  84   ALT 0 - 44 U/L 74  63  59     Imaging studies: No new pertinent imaging studies  Procedure: Changed previous wound vac. Cleansed with Vashe wound solution. A negative pressure dressing (DME) was placed in steps, covering a 20 cm x 2 cm (40 cm area). Wound healing as expected. No signs of infection noted.  I personally changed wound vac with Dr. Polly supervision.   Assessment/Plan:  78 y.o. 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   Small bowel obstruction   - Recurrent ileus s/p revision and closure of abdominal  wall resolving  - Tolerating soft diet, overall improving clinically   - At this point, patient is clear from surgical standpoint. Changed wound vac this afternoon. Plan for home health with [facility] and wound vac changes 3x/week. No contraindication for discharge once medically stable.   Pelvic hematoma           - Status post percutaneous drainage, placed on 07/25           - Decrease in output, 40 cc was charted yesterday            - Continue to monitor drain output   Miabella Shannahan [employer]

## 2024-06-10 NOTE — Plan of Care (Signed)
  Problem: Education: Goal: Knowledge of General Education information will improve Description: Including pain rating scale, medication(s)/side effects and non-pharmacologic comfort measures Outcome: Progressing   Problem: Coping: Goal: Level of anxiety will decrease Outcome: Progressing   Problem: Elimination: Goal: Will not experience complications related to bowel motility Outcome: Progressing Goal: Will not experience complications related to urinary retention Outcome: Progressing   Problem: Safety: Goal: Ability to remain free from injury will improve Outcome: Progressing   Problem: Skin Integrity: Goal: Risk for impaired skin integrity will decrease Outcome: Progressing   Problem: Health Behavior/Discharge Planning: Goal: Ability to manage health-related needs will improve Outcome: Not Progressing   Problem: Clinical Measurements: Goal: Ability to maintain clinical measurements within normal limits will improve Outcome: Not Progressing   Problem: Activity: Goal: Risk for activity intolerance will decrease Outcome: Not Progressing

## 2024-06-10 NOTE — Progress Notes (Signed)
 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  He was admitted for small bowel obstruction.  SBO was persistent despite conservative management.  General surgery took the patient for exploratory laparotomy on 7/11.  He underwent small bowel resection and appendectomy. Postoperative course has been further complicated by ileus.  He went back to the ER on 7/16 for reopening of recent laparotomy and closure of abdominal wall.  Patient has been on TPN.  CT scan also reveals hepatic mass which was thought to be malignancy, metastasis versus primary.  Oncology was consulted and has discussed this with the patient and his wife.  They are both aware that this is likely recurrent cancer but since the patient is bedbound at baseline and thus has poor functional status, there are no cancer therapies available to him at this time.  Patient is not interested in pursuing a biopsy of this mass.  Patient's wife reports she is not ready for hospice.  Currently they are planning to manage his SBO and discharge home when he is surgically cleared.  Subjective: Patient doing well today.  No acute events overnight.  He continues to tolerate his diet.  He is ready to discharge home   Objective: Vitals:   06/10/24 0345 06/10/24 0418 06/10/24 0849 06/10/24 1634  BP:  (!) 112/56 105/62 111/79  Pulse:  85 87 90  Resp:  16 16 20   Temp:  98.6 F (37 C) 98.4 F (36.9 C) 98.1 F (36.7 C)  TempSrc:  Oral  Oral  SpO2:  94% 96% 96%  Weight: 86.7 kg     Height:        Intake/Output  Summary (Last 24 hours) at 06/10/2024 1744 Last data filed at 06/10/2024 1700 Gross per 24 hour  Intake 135 ml  Output 1030 ml  Net -895 ml   Filed Weights   06/06/24 0407 06/08/24 0356 06/10/24 0345  Weight: 96.6 kg 88.8 kg 86.7 kg    Examination: General exam: Appears calm and comfortable, NAD  Respiratory system: No work of breathing, symmetric chest wall expansion Cardiovascular system: S1 & S2 heard, RRR.  Gastrointestinal system: appropriately tender to palpation.  Colostomy in place, wound VAC in place.  Bowel sounds present, abdomen is not significantly distended.  Right lower quadrant drain in place.  Ostomy in place Some soft stool in bag Neuro: Alert and oriented. No focal neurological deficits. Extremities: Symmetric, expected ROM Skin: No rashes, lesions Psychiatry: Demonstrates appropriate judgement and insight. Mood & affect appropriate for situation.   Assessment & Plan:  Principal Problem:   SBO (small bowel obstruction) (HCC) Active Problems:   UTI (urinary tract infection)   AKI (acute kidney injury) (HCC)   HTN (hypertension)   Chronic diastolic CHF (congestive heart failure) (HCC)   Rectal cancer (HCC)   Liver lesion   Coffee ground emesis   Alcohol use   Overweight (BMI 25.0-29.9)   Malnutrition of moderate degree    SBO status postsurgical intervention Postop ileus Pelvic hematoma - 7/11: Ex lap with small bowel resection and appendectomy - 7/16: Abdominal wall dehiscence, abdominal closure, wound VAC  placement - NG tube removed 7/15, reinserted 7/17, removed 7/21 - CT 7/24: Pelvic abscess appreciated.  No significant obstruction or ileus appreciated - 7/25 interventional radiology placed right pelvic abscess drain - Tolerating diet well.  TPN discontinued 7/27. - General Surgery following.  Wound VAC orders have been placed.  Awaiting home health arrangements.  Planning for changes 3 times per week.  Home health RN also ordered  History of  rectal cancer Colostomy status New liver and lung masses - Isolated mets to left lung, status postresection and adjuvant chemo.  Last oncology visit in 2022 lost to follow-up.  Patient's wife reports she can no longer transport him to doctor appointments - On this admission CT abdomen pelvis: Left lung base nodule as well as liver nodule.  MRI of liver and CT chest consistent with probable malignancy, metastasis versus new primary - Dr. Jacobo has been consulted and reviewed.  Reports no acute intervention at this time - Oncology has also discussed directly with patient and his wife.  They are aware that due to his poor functional status oncology cannot offer treatment - Patient's wife is currently declining hospice - Plan will be to discharge home once SBO is managed.  He will need home health and wound VAC management. - Palliative care consulted  Hyponatremia AKI Lactic acidosis - Likely prerenal secondary to the above - Resolved now - Continue to trend CMP.  Correct electrolytes as needed  Acute cystitis - Status post 5 days antibiotic course.  Culture with multiple species growth  Bedbound - At baseline   Hypertension - Stable without meds currently.  Monitor closely and titrate as needed  Alcohol use disorder - Denies withdrawal history.  No signs or symptoms of withdrawal during this admission  Macrocytic anemia - B12 and folate within normal limits  DVT prophylaxis: lovenox    Code Status: Limited: Do not attempt resuscitation (DNR) -DNR-LIMITED -Do Not Intubate/DNI  Disposition: Medically ready for discharge.  Pending home health arrangements for wound VAC. Consultants:  Treatment Team:  Consulting Physician: Rodolph Romano, MD  Procedures:    Antimicrobials:  Anti-infectives (From admission, onward)    Start     Dose/Rate Route Frequency Ordered Stop   05/24/24 1015  ceFAZolin  (ANCEF ) IVPB 2g/100 mL premix  Status:  Discontinued        2 g 200 mL/hr  over 30 Minutes Intravenous  Once 05/24/24 0921 05/27/24 0738   05/23/24 1300  cefTRIAXone  (ROCEPHIN ) 1 g in sodium chloride  0.9 % 100 mL IVPB        1 g 200 mL/hr over 30 Minutes Intravenous Every 24 hours 05/23/24 1208 05/29/24 1235   05/21/24 1800  cefTRIAXone  (ROCEPHIN ) 1 g in sodium chloride  0.9 % 100 mL IVPB        1 g 200 mL/hr over 30 Minutes Intravenous  Once 05/21/24 1049 05/21/24 1929   05/20/24 1800  cefTRIAXone  (ROCEPHIN ) 2 g in sodium chloride  0.9 % 100 mL IVPB        2 g 200 mL/hr over 30 Minutes Intravenous  Once 05/20/24 1750 05/20/24 2000       Data Reviewed: I have personally reviewed following labs and imaging studies CBC: Recent Labs  Lab 06/04/24 0511 06/05/24 0505 06/08/24 0325  WBC 11.2* 12.0* 12.4*  NEUTROABS 8.1* 8.8*  --   HGB 9.7* 8.8* 8.5*  HCT 29.5* 25.9* 25.4*  MCV 104.6* 101.6* 102.0*  PLT 182 205 227   Basic Metabolic Panel: Recent Labs  Lab 06/04/24 0511 06/05/24  0505 06/06/24 0356 06/07/24 0349 06/08/24 0325 06/09/24 0559 06/10/24 0346  NA 134* 130* 134* 135  --   --  131*  K 4.9 4.6 4.5 4.5  --   --  4.3  CL 102 102 104 106  --   --  103  CO2 26 23 24 24   --   --  21*  GLUCOSE 169* 208* 192* 185*  --   --  100*  BUN 19 22 26* 25*  --   --  23  CREATININE 0.77 0.58* 0.70 0.70  --   --  0.85  CALCIUM 8.6* 8.3* 8.3* 8.5*  --   --  8.4*  MG 2.2 2.2 2.1 2.2 2.2 2.2 2.1  PHOS  --   --  3.1  --   --   --  3.8   GFR: Estimated Creatinine Clearance: 76.5 mL/min (by C-G formula based on SCr of 0.85 mg/dL). Liver Function Tests: Recent Labs  Lab 06/06/24 0356 06/07/24 0349 06/10/24 0346  AST 84* 85* 85*  ALT 59* 63* 74*  ALKPHOS 158* 182* 258*  BILITOT 0.7 0.6 1.1  PROT 5.5* 6.0* 6.5  ALBUMIN 1.9* 2.1* 2.1*   CBG: Recent Labs  Lab 06/09/24 1249 06/09/24 1553 06/10/24 0751 06/10/24 1144 06/10/24 1631  GLUCAP 113* 113* 88 87 81    Recent Results (from the past 240 hours)  Aerobic/Anaerobic Culture w Gram Stain  (surgical/deep wound)     Status: None (Preliminary result)   Collection Time: 06/07/24 11:03 AM   Specimen: Abscess  Result Value Ref Range Status   Specimen Description   Final    ABSCESS Performed at [facility], [address] [place], [place] [address]    Special Requests   Final     CT PELVIC DRAIN Performed at [facility], [address] [place], [place] [address]    Gram Stain   Final    ABUNDANT WBC PRESENT, PREDOMINANTLY PMN FEW GRAM POSITIVE COCCI IN CHAINS RARE GRAM POSITIVE RODS    Culture   Final    ABUNDANT ENTEROCOCCUS FAECIUM ABUNDANT BACTEROIDES UNIFORMIS BETA LACTAMASE POSITIVE Performed at [facility], [address], [place], [place] [address]    Report Status PENDING  Incomplete   Organism ID, Bacteria ENTEROCOCCUS FAECIUM  Final      Susceptibility   Enterococcus faecium - MIC*    AMPICILLIN  >=32 RESISTANT Resistant     VANCOMYCIN  >=32 RESISTANT Resistant     GENTAMICIN SYNERGY SENSITIVE Sensitive     * ABUNDANT ENTEROCOCCUS FAECIUM     Radiology Studies: No results found.   Scheduled Meds:  vitamin C   500 mg Oral BID   Chlorhexidine  Gluconate Cloth  6 each Topical Daily   enoxaparin  (LOVENOX ) injection  40 mg Subcutaneous Q24H   feeding supplement  237 mL Oral TID BM   folic acid   1 mg Oral Daily   insulin  aspart  0-15 Units Subcutaneous TID WC   ketorolac   1 drop Right Eye QID   pantoprazole  (PROTONIX ) IV  40 mg Intravenous Q12H   polyethylene glycol  17 g Oral Daily   sodium chloride  flush  10-40 mL Intracatheter Q12H   sodium chloride  flush  5 mL Intracatheter Q8H   thiamine   100 mg Oral Daily   Continuous Infusions:     LOS: 21 days  MDM: Patient is high risk for one or more organ failure.  They necessitate ongoing hospitalization for continued IV therapies and subsequent lab  monitoring. Total time spent interpreting labs and vitals, reviewing the medical record, coordinating care amongst consultants  and care team members, directly assessing and discussing care with the patient and/or family: 55 min  Pahola Dimmitt, DO [facility]  To contact the attending physician between 7A-7P please use Epic Chat. To contact the covering physician during after hours 7P-7A, please review Amion.  06/10/2024, 5:44 PM   *This document has been created with the assistance of dictation software. Please excuse typographical errors. *

## 2024-06-11 ENCOUNTER — Other Ambulatory Visit: Payer: Self-pay

## 2024-06-11 DIAGNOSIS — K56609 Unspecified intestinal obstruction, unspecified as to partial versus complete obstruction: Secondary | ICD-10-CM | POA: Diagnosis not present

## 2024-06-11 LAB — MAGNESIUM: Magnesium: 2.3 mg/dL (ref 1.7–2.4)

## 2024-06-11 LAB — GLUCOSE, CAPILLARY
Glucose-Capillary: 94 mg/dL (ref 70–99)
Glucose-Capillary: 85 mg/dL (ref 70–99)
Glucose-Capillary: 100 mg/dL — ABNORMAL HIGH (ref 70–99)
Glucose-Capillary: 105 mg/dL — ABNORMAL HIGH (ref 70–99)

## 2024-06-11 MED ORDER — ACETAMINOPHEN 325 MG PO TABS: 650.0000 mg | ORAL_TABLET | Freq: Four times a day (QID) | ORAL | Status: DC | PRN

## 2024-06-11 MED ORDER — SODIUM CHLORIDE 0.9% FLUSH
5.0000 mL | Freq: Every day | INTRAVENOUS | Status: DC
  Administered 2024-06-11 – 2024-06-12 (×2): 5 mL

## 2024-06-11 MED ORDER — SODIUM CHLORIDE 0.9% FLUSH
5.0000 mL | Freq: Every day | INTRAVENOUS | 1 refills | Status: DC
  Filled 2024-06-11: qty 300, 30d supply, fill #0

## 2024-06-11 NOTE — TOC Progression Note (Signed)
 Transition of Care [facility]) - Progression Note    Patient Details  Name: Silviano Neuser MRN: [id number] Date of Birth: [birth year]  [facility] CM/SW Contact  Corean ONEIDA Haddock, RN Phone Number: 06/11/2024, 4:51 PM  Clinical Narrative:     Per patient his copay for vac was $700 and they declined.  Met with patient, wife and surgery at bedside. Per Surgery patient will have to have vac at dc  14m/kci  to call wife back and wife states that she will verbally agree to the $700.  After that wound vac delivery can be processed   Expected Discharge Plan: Home/Self Care Barriers to Discharge: Continued Medical Work up               Expected Discharge Plan and Services     Post Acute Care Choice: NA Living arrangements for the past 2 months: Single Family Home                                       Social Drivers of Health (SDOH) Interventions SDOH Screenings   Food Insecurity: No Food Insecurity (05/21/2024)  Housing: Low Risk  (05/21/2024)  Transportation Needs: No Transportation Needs (05/21/2024)  Utilities: Not At Risk (05/21/2024)  Social Connections: Moderately Integrated (05/21/2024)  Tobacco Use: Medium Risk (05/24/2024)    Readmission Risk Interventions     No data to display

## 2024-06-11 NOTE — Plan of Care (Signed)
   Problem: Education: Goal: Knowledge of General Education information will improve Description Including pain rating scale, medication(s)/side effects and non-pharmacologic comfort measures Outcome: Progressing

## 2024-06-11 NOTE — Progress Notes (Signed)
 Education on care for the drain was done. Educate wife how to flush the drain, all questions were answered. Box of flushes was redelivered at bedside, wife brought it home.

## 2024-06-11 NOTE — Progress Notes (Addendum)
 Referring Provider(s): Dr. Lucas Sjogren  Supervising Physician: Karalee Beat  Patient Status:  [facility] - In-pt  Chief Complaint: SBO complicated by pelvic fluid collection s/p drain placement on 7/25 seen for drain follow up  Brief History:  78 y.o. male with pmhx HTN, anemia, rectal cancer with metastasis to lung (s/p of colostomy, chemotherapy and radiation therapy) who presented to the ED with abdominal pain, abdominal distention and emesis. Found to have small bowel obstruction and was admitted.  05/24/24: initial exploratory laparotomy, dense adhesions from prior surgeries  05/29/24: developed dehiscence of his abdominal wall due to weak abdominal fascia and had repeat surgery to correct this  06/06/24: CT shows a pelvic fluid collection near the bladder 06/07/24: IR consulted and drain placed with Dr. JONETTA Faes.  Subjective:  Patient is eager to be discharged, continues to endorse RLQ pain. Denies any fevers/chills or worsening abdominal pain.   Allergies: Patient has no known allergies.  Medications: Prior to Admission medications   Medication Sig Start Date End Date Taking? Authorizing Provider  esomeprazole  (NEXIUM ) 20 MG packet Take 20 mg by mouth daily before breakfast.   Yes [provider]  hydrOXYzine (ATARAX) 10 MG tablet Take 10 mg by mouth 3 (three) times daily as needed for itching (allergies). 10/20/22  Yes [provider]  MAGNESIUM  PO Take 1 tablet by mouth daily.   Yes [provider]  Multiple Vitamin (MULTIVITAMIN WITH MINERALS) TABS tablet Take 1 tablet by mouth daily. 02/25/21  Yes Verdene Purchase, MD  Omega-3 Fatty Acids (OMEGA 3 PO) Take 1 tablet by mouth daily.   Yes [provider]  polyethylene glycol (MIRALAX  / GLYCOLAX ) 17 g packet Take 17 g by mouth daily as needed for moderate constipation.   Yes [provider]  vitamin B-12 (CYANOCOBALAMIN ) 100 MCG tablet Take 100 mcg by mouth daily.   Yes  [provider]  vitamin C  (ASCORBIC ACID ) 250 MG tablet Take 250 mg by mouth daily.   Yes [provider]  Vitamin D , Cholecalciferol , 25 MCG (1000 UT) TABS Take 1,000 Units by mouth daily.    [provider]     Vital Signs: BP 104/64 (BP Location: Left Arm)   Pulse 80   Temp 98.4 F (36.9 C) (Oral)   Resp 12   Ht 5' 7 (1.702 m)   Wt 190 lb 11.2 oz (86.5 kg)   SpO2 94%   BMI 29.87 kg/m   Physical Exam Constitutional:      Appearance: Normal appearance.  Cardiovascular:     Rate and Rhythm: Normal rate.     Pulses: Normal pulses.  Pulmonary:     Effort: Pulmonary effort is normal.  Abdominal:     General: Abdomen is flat.     Palpations: Abdomen is soft.     Tenderness: There is abdominal tenderness (throughout; RLQ tenderness lateral to drain site).     Comments:  Midline wound vac in place. Appropriately tender around midline incision RLQ drain in place to gravity bag. ~10cc serosanguinous fluid in bag. Flushes/aspirates easily. Overlying dressing clean/dry. Insertion unremarkable.  Skin:    General: Skin is warm and dry.  Neurological:     Mental Status: He is alert and oriented to person, place, and time.         Labs:  CBC: Recent Labs    06/03/24 0505 06/04/24 0511 06/05/24 0505 06/08/24 0325  WBC 13.2* 11.2* 12.0* 12.4*  HGB 9.7* 9.7* 8.8* 8.5*  HCT 29.3* 29.5*  25.9* 25.4*  PLT 179 182 205 227    COAGS: Recent Labs    05/20/24 2258  INR 1.2  APTT 23*    BMP: Recent Labs    06/05/24 0505 06/06/24 0356 06/07/24 0349 06/10/24 0346  NA 130* 134* 135 131*  K 4.6 4.5 4.5 4.3  CL 102 104 106 103  CO2 23 24 24  21*  GLUCOSE 208* 192* 185* 100*  BUN 22 26* 25* 23  CALCIUM 8.3* 8.3* 8.5* 8.4*  CREATININE 0.58* 0.70 0.70 0.85  GFRNONAA >60 >60 >60 >60    LIVER FUNCTION TESTS: Recent Labs    06/03/24 0505 06/06/24 0356 06/07/24 0349 06/10/24 0346  BILITOT 0.3 0.7 0.6 1.1  AST 34 84* 85* 85*  ALT 16 59*  63* 74*  ALKPHOS 101 158* 182* 258*  PROT 5.6* 5.5* 6.0* 6.5  ALBUMIN 2.2* 1.9* 2.1* 2.1*    Assessment and Plan:  Post-operative pelvic fluid collection: Stephen [name] is a 78 y.o. male with a history of rectal cancer s/p colostomy, SBO, and recent ex-lap due to dense adhesions complicated by abdominal wall dehiscence and post-operative pelvic fluid collection. Patient underwent drain placement in IR for pelvic fluid collection on 7/25.  Output by Drain (mL) 06/09/24 0701 - 06/09/24 1900 06/09/24 1901 - 06/10/24 0700 06/10/24 0701 - 06/10/24 1900 06/10/24 1901 - 06/11/24 0700 06/11/24 0701 - 06/11/24 9062  Closed System Drain 1 Lateral;Right Abdomen 10 Fr.  10 50 45   Negative Pressure Wound Therapy Abdomen 0 30 50 50    -Drain output 45 mL overnight; 50 mL during the day prior. Serosanguinous. Originally planned to repeat imaging today to assess for continued need for drain. Given increased output, will set up for drain to remain at DC and for him to return to [facility] for drain check 10-14 days from placement. -Patient remains rather tender in the area of the drain -VSS -most recent CBC 7/26 (WBC 12.4) -IR will continue to follow while admitted  Interval imaging/drain manipulation:  None  Current examination: Flushes/aspirates easily.  Insertion site unremarkable. Suture and stat lock in place. Dressed appropriately.  While Inpatient: Continue TID flushes with 5 cc NS. Record output Q shift. Dressing changes QD or PRN if soiled.  Call IR APP or on call IR MD if difficulty flushing or sudden change in drain output.  Repeat imaging/possible drain injection once output < 10 mL/QD (excluding flush material). Consideration for drain removal if output is < 10 mL/QD (excluding flush material), pending discussion with the providing surgical service.  Discharge planning: Patient will follow up with IR clinic 10-14 days post drain place for repeat imaging/possible drain injection. IR  scheduler will contact patient with date/time of appointment. This will likely need to occur at [facility] rather than clinic d/t patient mobility requiring ambulance transport. Patient will need to flush drain QD with 5 cc NS, record output QD, dressing changes every 2-3 days or earlier if soiled. This was discussed with family and patient during rounding. Family aware to call IR to come sooner if drain output drops in the meantime.   IR will continue to follow - please call with questions or concerns.   Thank you for allowing our service to participate in Stephen [name] 's care.   Electronically Signed: Laymon Coast, NP 06/11/2024, 9:37 AM    I spent a total of 15 Minutes at the the patient's bedside AND on the patient's hospital floor or unit, greater than 50% of which was counseling/coordinating care for  drain care follow up.

## 2024-06-11 NOTE — Progress Notes (Signed)
 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  He was admitted for small bowel obstruction.  SBO was persistent despite conservative management.  General surgery took the patient for exploratory laparotomy on 7/11.  He underwent small bowel resection and appendectomy. Postoperative course has been further complicated by ileus.  He went back to the ER on 7/16 for reopening of recent laparotomy and closure of abdominal wall.  Patient has been on TPN.  CT scan also reveals hepatic mass which was thought to be malignancy, metastasis versus primary.  Oncology was consulted and has discussed this with the patient and his wife.  They are both aware that this is likely recurrent cancer but since the patient is bedbound at baseline and thus has poor functional status, there are no cancer therapies available to him at this time.  Patient is not interested in pursuing a biopsy of this mass.  Patient's wife reports she is not ready for hospice.  Currently they are planning to manage his SBO and discharge home with wound vac.  Subjective: Doing well today.  No acute complaints.  Still anxious to discharge home.  There has been some complexity of obtaining his wound VAC.   Objective: Vitals:   06/11/24 0353 06/11/24 0500 06/11/24 0750 06/11/24 1638  BP: 108/66  104/64 115/70  Pulse: 83  80 87  Resp: 16  12 18   Temp: 97.9 F (36.6 C)  98.4 F (36.9 C) 98.4 F (36.9 C)  TempSrc: Oral  Oral Oral  SpO2: 94%  94% 95%  Weight:  86.5 kg    Height:        Intake/Output  Summary (Last 24 hours) at 06/11/2024 1719 Last data filed at 06/11/2024 1500 Gross per 24 hour  Intake 15 ml  Output 1235 ml  Net -1220 ml   Filed Weights   06/08/24 0356 06/10/24 0345 06/11/24 0500  Weight: 88.8 kg 86.7 kg 86.5 kg    Examination: General exam: Appears calm and comfortable, NAD  Respiratory system: No work of breathing, symmetric chest wall expansion Cardiovascular system: S1 & S2 heard, RRR.  Gastrointestinal system: appropriately tender to palpation.  Colostomy in place, wound VAC in place.  Bowel sounds present, abdomen is not significantly distended.  Right lower quadrant drain in place.  Ostomy in place Some soft stool in bag Neuro: Alert and oriented. No focal neurological deficits. Extremities: Symmetric, expected ROM Skin: No rashes, lesions Psychiatry: Demonstrates appropriate judgement and insight. Mood & affect appropriate for situation.   Assessment & Plan:  Principal Problem:   SBO (small bowel obstruction) (HCC) Active Problems:   UTI (urinary tract infection)   AKI (acute kidney injury) (HCC)   HTN (hypertension)   Chronic diastolic CHF (congestive heart failure) (HCC)   Rectal cancer (HCC)   Liver lesion   Coffee ground emesis   Alcohol use   Overweight (BMI 25.0-29.9)   Malnutrition of moderate degree   Intra-abdominal abscess (HCC)    SBO status postsurgical intervention Postop ileus Pelvic hematoma - 7/11: Ex lap with small bowel resection and appendectomy - 7/16: Abdominal wall dehiscence, abdominal  closure, wound VAC placement - NG tube removed 7/15, reinserted 7/17, removed 7/21 - CT 7/24: Pelvic abscess appreciated.  No significant obstruction or ileus appreciated - 7/25 interventional radiology placed right pelvic abscess drain - Tolerating diet well.  TPN discontinued 7/27. - General Surgery following.  Wound VAC orders have been placed.  Awaiting home health arrangements.  Planning for changes 3 times per week.  Home health RN  also ordered  History of rectal cancer Colostomy status New liver and lung masses - Isolated mets to left lung, status postresection and adjuvant chemo.  Last oncology visit in 2022 lost to follow-up.  Patient's wife reports she can no longer transport him to doctor appointments - On this admission CT abdomen pelvis: Left lung base nodule as well as liver nodule.  MRI of liver and CT chest consistent with probable malignancy, metastasis versus new primary - Dr. Jacobo has been consulted and reviewed.  Reports no acute intervention at this time - Oncology has also discussed directly with patient and his wife.  They are aware that due to his poor functional status oncology cannot offer treatment - Patient's wife is currently declining hospice - Plan will be to discharge home once SBO is managed.  He will need home health and wound VAC management. - Palliative care consulted  Hyponatremia AKI Lactic acidosis - Likely prerenal secondary to the above - Resolved now - Continue to trend CMP.  Correct electrolytes as needed  Acute cystitis - Status post 5 days antibiotic course.  Culture with multiple species growth  Bedbound - At baseline   Hypertension - Stable without meds currently.  Monitor closely and titrate as needed  Alcohol use disorder - Denies withdrawal history.  No signs or symptoms of withdrawal during this admission  Macrocytic anemia - B12 and folate within normal limits  DVT prophylaxis: lovenox    Code Status: Limited: Do not attempt resuscitation (DNR) -DNR-LIMITED -Do Not Intubate/DNI  Disposition: Medically ready for discharge.  Planned to discharge yesterday with wound VAC.  There has been some delay due to difficulty with payment and delivery.  When this is delivered he can be discharged.  Anticipate this will be tomorrow. Consultants:  Treatment Team:  Consulting Physician: Rodolph Romano, MD  Procedures:    Antimicrobials:  Anti-infectives (From  admission, onward)    Start     Dose/Rate Route Frequency Ordered Stop   05/24/24 1015  ceFAZolin  (ANCEF ) IVPB 2g/100 mL premix  Status:  Discontinued        2 g 200 mL/hr over 30 Minutes Intravenous  Once 05/24/24 0921 05/27/24 0738   05/23/24 1300  cefTRIAXone  (ROCEPHIN ) 1 g in sodium chloride  0.9 % 100 mL IVPB        1 g 200 mL/hr over 30 Minutes Intravenous Every 24 hours 05/23/24 1208 05/29/24 1235   05/21/24 1800  cefTRIAXone  (ROCEPHIN ) 1 g in sodium chloride  0.9 % 100 mL IVPB        1 g 200 mL/hr over 30 Minutes Intravenous  Once 05/21/24 1049 05/21/24 1929   05/20/24 1800  cefTRIAXone  (ROCEPHIN ) 2 g in sodium chloride  0.9 % 100 mL IVPB        2 g 200 mL/hr over 30 Minutes Intravenous  Once 05/20/24 1750 05/20/24 2000       Data Reviewed: I have personally reviewed following labs and imaging studies CBC: Recent Labs  Lab 06/05/24 0505 06/08/24 0325  WBC 12.0* 12.4*  NEUTROABS 8.8*  --   HGB 8.8* 8.5*  HCT 25.9* 25.4*  MCV 101.6* 102.0*  PLT 205 227   Basic Metabolic Panel: Recent Labs  Lab 06/05/24 0505 06/06/24 0356 06/07/24 0349 06/08/24 0325 06/09/24 0559 06/10/24 0346 06/11/24 0300  NA 130* 134* 135  --   --  131*  --   K 4.6 4.5 4.5  --   --  4.3  --   CL 102 104 106  --   --  103  --   CO2 23 24 24   --   --  21*  --   GLUCOSE 208* 192* 185*  --   --  100*  --   BUN 22 26* 25*  --   --  23  --   CREATININE 0.58* 0.70 0.70  --   --  0.85  --   CALCIUM 8.3* 8.3* 8.5*  --   --  8.4*  --   MG 2.2 2.1 2.2 2.2 2.2 2.1 2.3  PHOS  --  3.1  --   --   --  3.8  --    GFR: Estimated Creatinine Clearance: 76.5 mL/min (by C-G formula based on SCr of 0.85 mg/dL). Liver Function Tests: Recent Labs  Lab 06/06/24 0356 06/07/24 0349 06/10/24 0346  AST 84* 85* 85*  ALT 59* 63* 74*  ALKPHOS 158* 182* 258*  BILITOT 0.7 0.6 1.1  PROT 5.5* 6.0* 6.5  ALBUMIN 1.9* 2.1* 2.1*   CBG: Recent Labs  Lab 06/10/24 1144 06/10/24 1631 06/11/24 0751 06/11/24 1116  06/11/24 1640  GLUCAP 87 81 94 85 100*    Recent Results (from the past 240 hours)  Aerobic/Anaerobic Culture w Gram Stain (surgical/deep wound)     Status: None (Preliminary result)   Collection Time: 06/07/24 11:03 AM   Specimen: Abscess  Result Value Ref Range Status   Specimen Description   Final    ABSCESS Performed at [facility], [address] [place], [place] [address]    Special Requests   Final     CT PELVIC DRAIN Performed at [facility], [address] [place], [place] [address]    Gram Stain   Final    ABUNDANT WBC PRESENT, PREDOMINANTLY PMN FEW GRAM POSITIVE COCCI IN CHAINS RARE GRAM POSITIVE RODS    Culture   Final    ABUNDANT ENTEROCOCCUS FAECIUM ABUNDANT BACTEROIDES UNIFORMIS BETA LACTAMASE POSITIVE Performed at [facility], [address], [place], [place] [address]    Report Status PENDING  Incomplete   Organism ID, Bacteria ENTEROCOCCUS FAECIUM  Final      Susceptibility   Enterococcus faecium - MIC*    AMPICILLIN  >=32 RESISTANT Resistant     VANCOMYCIN  >=32 RESISTANT Resistant     GENTAMICIN SYNERGY SENSITIVE Sensitive     * ABUNDANT ENTEROCOCCUS FAECIUM     Radiology Studies: No results found.   Scheduled Meds:  vitamin C   500 mg Oral BID   Chlorhexidine  Gluconate Cloth  6 each Topical Daily   enoxaparin  (LOVENOX ) injection  40 mg Subcutaneous Q24H   feeding supplement  237 mL Oral TID BM   folic acid   1 mg Oral Daily   insulin  aspart  0-15 Units Subcutaneous TID WC   ketorolac   1 drop Right Eye QID   pantoprazole  (PROTONIX ) IV  40 mg Intravenous Q12H   polyethylene glycol  17 g Oral Daily   sodium chloride  flush  10-40 mL Intracatheter Q12H   sodium chloride  flush  5 mL Intracatheter Q8H   sodium chloride   flush  5 mL Intracatheter Daily   thiamine   100 mg Oral Daily   Continuous Infusions:     LOS: 22 days  MDM: Patient is high risk for one or more organ failure.  They necessitate ongoing  hospitalization for continued IV therapies and subsequent lab monitoring. Total time spent interpreting labs and vitals, reviewing the medical record, coordinating care amongst consultants and care team members, directly assessing and discussing care with the patient and/or family: 55 min  Braidyn Scorsone, DO [facility]  To contact the attending physician between 7A-7P please use Epic Chat. To contact the covering physician during after hours 7P-7A, please review Amion.  06/11/2024, 5:19 PM   *This document has been created with the assistance of dictation software. Please excuse typographical errors. *

## 2024-06-11 NOTE — Discharge Instructions (Signed)
 Patient will follow up with IR clinic 10-14 days post drain place for repeat imaging/possible drain injection. IR scheduler will contact patient with date/time of appointment. This will likely need to occur at [facility] rather than clinic so that you can have ambulance transport due to mobility. You will need to flush drain daily with 5 mL NS (prescription at [facility]), record output QD, dressing changes every 2-3 days or earlier if soiled. Family aware to call IR to come sooner if drain output drops in the meantime. IR number listed below.   Interventional Radiology Percutaneous Abscess Drain Placement After Care   This sheet gives you information about how to care for yourself after your procedure. Your health care provider may also give you more specific instructions. Your drain was placed by an interventional radiologist with [facility]. If you have questions or concerns, contact [facility] at [phone].   What is a percutaneous drain?   A drain is a small plastic tube (catheter) that goes into the fluid collection in your body through your skin.   How long will I need the drain?   How long the drain needs to stay in is determined by where the drain is, how much comes out of the drain each day and if you are having any other surgical procedures.   Interventional radiology will determine when it is time to remove the drain. It is important to follow up as directed so that the drain can be removed as soon as it is safe to do so.   What can I expect after the procedure?   After the procedure, it is common to have:   A small amount of bruising and discomfort in the area where the drainage tube (catheter) was placed.   Sleepiness and fatigue. This should go away after the medicines you were given have worn off.   Follow these instructions at home:   Insertion site care   Check your insertion site when you change the bandage. Check for:   More redness,  swelling, or pain.   More fluid or blood.   Warmth.   Pus or a bad smell.   When caring for your insertion site:   Wash your hands with soap and water for at least 20 seconds before and after you change your bandage (dressing). If soap and water are not available, use hand sanitizer.   You do not need to change your dressing everyday if it is clean and dry. Change your dressing every 3 days or as needed when it is soiled, wet or becoming dislodged. You will need to change your dressing each time you shower.   Leave stitches (sutures), skin glue, or adhesive strips in place. These skin closures may need to stay in place for 2 weeks or longer. If adhesive strip edges start to loosen and curl up, you may trim the loose edges. Do not remove adhesive strips completely unless your health care provider tells you to do so.   Catheter care   Flush the catheter once per day with 5 mL of 0.9% normal saline unless you are told otherwise by your healthcare provider. This helps to prevent clogs in the catheter.   To disconnect the drain, turn the clear plastic tube to the left. Attach the saline syringe by placing it on the white end of the drain and turning gently to the right. Once attached gently push the plunger to the 5 mL mark. After you are done flushing, disconnect  the syringe by turning to the left and reattach your drainage container   If you have a bulb please be sure the bulb is charged after reconnecting it - to do this pinch the bulb between your thumb and first finger and close the stopper located on the top of the bulb.    Check for fluid leaking from around your catheter (instead of fluid draining through your catheter). This may be a sign that the drain is no longer working correctly.   Write down the following information every time you empty your bag:   The date and time.   The amount of drainage.   Activity   Rest at home for 1-2 days after your procedure.   For the first  48 hours do not lift anything more than 10 lbs (about a gallon of milk). You may perform moderate activities/exercise. Please avoid strenuous activities during this time.   Avoid any activities which may pull on your drain as this can cause your drain to become dislodged.   If you were given a sedative during the procedure, it can affect you for several hours. Do not drive or operate machinery until your health care provider says that it is safe.   General instructions   For mild pain take over-the-counter medications as needed for pain such as Tylenol  or Advil. If you are experiencing severe pain please call our office as this may indicate an issue with your drain.    If you were prescribed an antibiotic medicine, take it as told by your health care provider. Do not stop using the antibiotic even if you start to feel better.   You may shower 24 hours after the drain is placed. To do this cover the insertion site with a water tight material such as saran wrap and seal the edges with tape, you may also purchase waterproof dressings at your local drug store. Shower as usual and then remove the water tight dressing and any gauze/tape underneath it once you have exited the shower and dried off. Allow the area to air dry or pat dry with a clean towel. Once the skin is completely dry place a new gauze dressing. It is important to keep the site dry at all times to prevent infection.   Do not submerge the drain - this means you cannot take baths, swim, use a hot tub, etc. until the drain is removed.    Do not use any products that contain nicotine  or tobacco, such as cigarettes, e-cigarettes, and chewing tobacco. If you need help quitting, ask your health care provider.   Keep all follow-up visits as told by your health care provider. This is important.   Contact a health care provider if:   You have less than 10 mL of drainage a day for 2-3 days in a row, or as directed by your health care provider.    You have any of these signs of infection:   More redness, swelling, or pain around your incision area.   More fluid or blood coming from your incision area.   Warmth coming from your incision area.   Pus or a bad smell coming from your incision area.   You have fluid leaking from around your catheter (instead of through your catheter).   You are unable to flush the drain.   You have a fever or chills.   You have pain that does not get better with medicine.   You have not been contacted to schedule  a drain follow up appointment within 10 days of discharge from the hospital.   Please call [facility] at [phone] with any questions or concerns.   Get help right away if:   Your catheter comes out.   You suddenly stop having drainage from your catheter.   You suddenly have blood in the fluid that is draining from your catheter.   You become dizzy or you faint.   You develop a rash.   You have nausea or vomiting.   You have difficulty breathing or you feel short of breath.   You develop chest pain.   You have problems with your speech or vision.   You have trouble balancing or moving your arms or legs.   Summary   It is common to have a small amount of bruising and discomfort in the area where the drainage tube (catheter) was placed. You may also have minor discomfort with movement while the drain is in place.   Flush the drain once per day with 5 mL of 0.9% normal saline (unless you were told otherwise by your healthcare provider).    Record the amount of drainage from the bag every time you empty it.   Change the dressing every 3 days or earlier if soiled/wet. Keep the skin dry under the dressing.   You may shower with the drain in place. Do not submerge the drain (no baths, swimming, hot tubs, etc.).   Contact [facility] at [phone] if you have more redness, swelling, or pain around your incision area or if you have pain that  does not get better with medicine.   This information is not intended to replace advice given to you by your health care provider. Make sure you discuss any questions you have with your health care provider.   Document Revised: 02/03/2022 Document Reviewed: 10/26/2019   Elsevier Patient Education  2023 [employer] Inc[employer]         Interventional Radiology Drain Record   Empty your drain at least once per day. You may empty it as often as needed. Use this form to write down the amount of fluid that has collected in the drainage container. Bring this form with you to your follow-up visits. Please call [facility] at [phone] with any questions or concerns prior to your appointment.   Drain #1 location: ___________________   Date __________ Time __________ Amount __________   Date __________ Time __________ Amount __________   Date __________ Time __________ Amount __________   Date __________ Time __________ Amount __________   Date __________ Time __________ Amount __________   Date __________ Time __________ Amount __________   Date __________ Time __________ Amount __________   Date __________ Time __________ Amount __________   Date __________ Time __________ Amount __________   Date __________ Time __________ Amount __________   Date __________ Time __________ Amount __________   Date __________ Time __________ Amount __________   Date __________ Time __________ Amount __________   Date __________ Time __________ Amount __________

## 2024-06-12 DIAGNOSIS — K56609 Unspecified intestinal obstruction, unspecified as to partial versus complete obstruction: Secondary | ICD-10-CM | POA: Diagnosis not present

## 2024-06-12 LAB — GLUCOSE, CAPILLARY
Glucose-Capillary: 97 mg/dL (ref 70–99)
Glucose-Capillary: 103 mg/dL — ABNORMAL HIGH (ref 70–99)
Glucose-Capillary: 91 mg/dL (ref 70–99)

## 2024-06-12 LAB — AEROBIC/ANAEROBIC CULTURE W GRAM STAIN (SURGICAL/DEEP WOUND)

## 2024-06-12 LAB — MAGNESIUM: Magnesium: 2.4 mg/dL (ref 1.7–2.4)

## 2024-06-12 MED ORDER — VITAMIN B-1 100 MG PO TABS: 100.0000 mg | ORAL_TABLET | Freq: Every day | ORAL | Status: DC

## 2024-06-12 MED ORDER — FOLIC ACID 1 MG PO TABS: 1.0000 mg | ORAL_TABLET | Freq: Every day | ORAL | Status: DC

## 2024-06-12 MED ORDER — ENSURE PLUS HIGH PROTEIN PO LIQD: 237.0000 mL | Freq: Three times a day (TID) | ORAL | Status: DC

## 2024-06-12 NOTE — Progress Notes (Signed)
WIFE AT BEDSIDE

## 2024-06-12 NOTE — Plan of Care (Signed)

## 2024-06-12 NOTE — Progress Notes (Addendum)
 Nutrition Follow Up Note   DOCUMENTATION CODES:   Not applicable  INTERVENTION:   Ensure Plus High Protein po TID, each supplement provides 350 kcal and 20 grams of protein  Magic cup TID with meals, each supplement provides 290 kcal and 9 grams of protein  MVI po daily   Vitamin C  500mg  po BID   Daily weights   NUTRITION DIAGNOSIS:   Inadequate oral intake related to acute illness as evidenced by NPO status. -ongoing   GOAL:   Patient will meet greater than or equal to 90% of their needs -met   MONITOR:   PO intake, Supplement acceptance, Labs, Weight trends, Skin, I & O's ASSESSMENT:   78 y/o male with h/o HTN, etoh abuse, GERD, CHF, esophageal stricture, neuropathy, LE DVT, depression, bedbound and metastatic rectal cancer s/p chemoradiation and abdominoperineal proctocelectomy (2015) with metastasis to the lung s/p left lung lobectomy 2015 who is admitted with UTI, AKI and SBO now s/p exploratory laparotomy with small bowel resection (27cm terminal ileum) & appendectomy 7/11 complicated by abdominal wall dehiscence s/p re-opening of recent laparotomy with closure of abdominal wall 7/16.  Pt s/p IR drain 7/25  Met with pt in room today. Pt is good spirits and reports that he is feeling much better. Pt is anxious to go home today. Pt reports that his appetite is much improved but reports that he does not like the hospital food. Pt is documented to be eating 100% of meals today. Pt is drinking the Ensure and reports that he loves this and will continue it at home (coupons given today). TPN discontinued 7/27. RD discussed with pt the importance of adequate nutrition needed to preserve lean muscle and support wound healing. Recommend for patient to continue Ensure, MVI and vitamin C  daily at home until wound healing complete. VAC remains in place. IR drain in place. Per chart, pt remains fairly weight stable since admission. Exam looks similar today as it did on admission.      Medications reviewed and include: vitamin C , lovenox , folic acid , insulin , protonix , miralax , thiamine   Labs reviewed: Na 131(L), K 4.3 wnl, P 3.8 wnl, Mg 2.4 wnl Triglycerides- 91- 7/28 B12- 1724(H), folate 12.1 wnl, vitamin D  111.09(H), B6 2.9(L)- 7/9 Wbc- 12.4(H), Hgb 8.5(L), Hct 25.4(L), MCV 102.0H), MCH 34.1(H) Cbgs- 103, 97 x 24 hrs   UOP-  Drains- 25ml   Nutrition Focused Physical Exam:   Flowsheet Row Most Recent Value  Orbital Region Mild depletion  Upper Arm Region No depletion  Thoracic and Lumbar Region No depletion  Buccal Region Mild depletion  Temple Region Mild depletion  Clavicle Bone Region No depletion  Clavicle and Acromion Bone Region No depletion  Scapular Bone Region No depletion  Dorsal Hand Mild depletion  Patellar Region No depletion  Anterior Thigh Region Mild depletion  Posterior Calf Region No depletion  Edema (RD Assessment) Mild  Hair Reviewed  Eyes Reviewed  Mouth Reviewed  Skin Reviewed  Nails Reviewed   Diet Order:    Diet Order             DIET SOFT Fluid consistency: Thin  Diet effective now                  EDUCATION NEEDS:   Not appropriate for education at this time  Skin:  Skin Assessment: Reviewed RN Assessment (R hand wound, surgical wound abdomen, VAC)  Last BM:  7/30- 40ml via ostomy  Height:   Ht Readings from Last 1  Encounters:  05/24/24 5' 7 (1.702 m)    Weight:   Wt Readings from Last 1 Encounters:  06/12/24 84 kg    Ideal Body Weight:  67.2 kg  BMI:  Body mass index is 29 kg/m.  Estimated Nutritional Needs:   Kcal:  1900-2200kcal/day  Protein:  95-110g/day  Fluid:  1.7-2.0L/day  Augustin Shams MS, RD, LDN If unable to be reached, please send secure chat to RD inpatient available from 8:00a-4:00p daily

## 2024-06-12 NOTE — Anesthesia Postprocedure Evaluation (Signed)
 Anesthesia Post Note  Patient: Stephen [name]  Procedure(s) Performed: LAPAROTOMY, EXPLORATORY  Patient location during evaluation: PACU Anesthesia Type: General Level of consciousness: awake and alert Pain management: pain level controlled Vital Signs Assessment: post-procedure vital signs reviewed and stable Respiratory status: spontaneous breathing, nonlabored ventilation, respiratory function stable and patient connected to nasal cannula oxygen Cardiovascular status: blood pressure returned to baseline and stable Postop Assessment: no apparent nausea or vomiting Anesthetic complications: no   No notable events documented.   Last Vitals:  Vitals:   06/11/24 2047 06/12/24 0446  BP: 106/66 112/65  Pulse: 85 76  Resp: 18 18  Temp: 36.9 C 36.8 C  SpO2: 95% 97%    Last Pain:  Vitals:   06/12/24 0446  TempSrc: Oral  PainSc:                  Lynwood KANDICE Clause

## 2024-06-12 NOTE — TOC Transition Note (Signed)
 Transition of Care [facility]) - Discharge Note   Patient Details  Name: Stephen [name] MRN: [id number] Date of Birth: Feb 24, 1946  Transition of Care [facility]) CM/SW Contact:  Lauraine JAYSON Carpen, LCSW Phone Number: 06/12/2024, 4:29 PM   Clinical Narrative:  Patient has orders to discharge home today. [facility] liaison is aware. [facility] has been arranged and he is 4th on the list. No further concerns. CSW signing off.   Final next level of care: Home w Home Health Services Barriers to Discharge: Barriers Resolved   Patient Goals and CMS Choice   CMS Medicare.gov Compare Post Acute Care list provided to:: Patient Represenative (must comment) (Wife)        Discharge Placement                Patient to be transferred to facility by: [facility]   Patient and family notified of of transfer: 06/12/24  Discharge Plan and Services Additional resources added to the After Visit Summary for       Post Acute Care Choice: NA                               Social Drivers of Health (SDOH) Interventions SDOH Screenings   Food Insecurity: No Food Insecurity (05/21/2024)  Housing: Low Risk  (05/21/2024)  Transportation Needs: No Transportation Needs (05/21/2024)  Utilities: Not At Risk (05/21/2024)  Social Connections: Moderately Integrated (05/21/2024)  Tobacco Use: Medium Risk (05/24/2024)     Readmission Risk Interventions     No data to display

## 2024-06-12 NOTE — Discharge Summary (Signed)
 Physician Discharge Summary   Stephen [name]  male DOB: 1946/10/06  FMW:[id number]  PCP: Derick Leita POUR, MD  Admit date: 05/20/2024 Discharge date: 06/12/2024  Admitted From: home Disposition:  home Home Health: Yes CODE STATUS: DNR  Discharge Instructions     Discharge wound care:   Complete by: As directed    Wound vac change 3 times per week [facility] Course:  For full details, please see H&P, progress notes, consult notes and ancillary notes.  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, who presented to the ED with abdominal pain, distention, coffee-ground emesis.    He was admitted for small bowel obstruction.  SBO was persistent despite conservative management.  General surgery took the patient for exploratory laparotomy on 7/11.  He underwent small bowel resection and appendectomy. Postoperative course was further complicated by ileus.  He went back to the ER on 7/16 for reopening of recent laparotomy and closure of abdominal wall.  Patient was started on TPN.   CT scan also revealed hepatic mass which was thought to be malignancy, metastasis versus primary.  Oncology was consulted and has discussed this with the patient and his wife.  They are both aware that this is likely recurrent cancer but since the patient is bedbound at baseline and thus has poor functional status, there are no cancer therapies available to him at this time.  Patient is not interested in pursuing a biopsy of this mass.  Patient's wife reports she is not ready for hospice.  Pt and wife planned to manage his SBO and discharge home with wound vac.  SBO status postsurgical intervention Postop ileus Pelvic hematoma - 7/11: Ex lap with small bowel resection and appendectomy - 7/16: Abdominal wall dehiscence, abdominal  closure, wound VAC placement - NG tube removed 7/15, reinserted 7/17, removed 7/21 - CT 7/24: Pelvic abscess appreciated.  No significant obstruction or ileus appreciated - 7/25 interventional radiology placed right pelvic abscess drain - Tolerating diet well.  TPN discontinued 7/27. - General Surgery following.  Pt discharged with wound vac and drain.  Planning for changes 3 times per week.  Home health RN also ordered.   History of rectal cancer Colostomy status New liver and lung masses - Isolated mets to left lung, status postresection and adjuvant chemo.  Last oncology visit in 2022 lost to follow-up.  Patient's wife reports she can no longer transport him to doctor appointments - On this admission CT abdomen pelvis: Left lung base nodule as well as liver nodule.  MRI of liver and CT chest consistent with probable malignancy, metastasis versus new primary - Dr. Jacobo has been consulted and reviewed.  Reports no acute intervention at this time - Oncology has also discussed directly with patient and his wife.  They are aware that due to his poor functional status oncology cannot offer treatment - Palliative care consulted.  Patient's wife is currently declining hospice   AKI - Likely prerenal secondary to the above --Cr 1.91 on presentation, 0.85 prior to discharge.  Lactic acidosis, resolved  Hyponatremia --intermittent in low 130's.  Acute cystitis Culture with multiple species growth --was on ceftriaxone  for 9 days.   Bedbound - At baseline    Hypertension, not currently active - wnl without meds currently.     Alcohol use disorder - Denies withdrawal  history.  No signs or symptoms of withdrawal during this admission   Macrocytic anemia - no def in B12 and folate    Discharge Diagnoses:  Principal Problem:   SBO (small bowel obstruction) (HCC) Active Problems:   UTI (urinary tract infection)   AKI (acute kidney injury) (HCC)   HTN (hypertension)   Chronic  diastolic CHF (congestive heart failure) (HCC)   Rectal cancer (HCC)   Liver lesion   Coffee ground emesis   Alcohol use   Overweight (BMI 25.0-29.9)   Malnutrition of moderate degree   Intra-abdominal abscess (HCC)   30 Day Unplanned Readmission Risk Score    Flowsheet Row ED to Hosp-Admission (Current) from 05/20/2024 in [facility]  30 Day Unplanned Readmission Risk Score (%) 23.2 Filed at 06/12/2024 1201    This score is the patient's risk of an unplanned readmission within 30 days of being discharged (0 -100%). The score is based on dignosis, age, lab data, medications, orders, and past utilization.   Low:  0-14.9   Medium: 15-21.9   High: 22-29.9   Extreme: 30 and above         Discharge Instructions:  Allergies as of 06/12/2024   No Known Allergies      Medication List     TAKE these medications    esomeprazole  20 MG packet Commonly known as: NEXIUM  Take 20 mg by mouth daily before breakfast.   feeding supplement Liqd Take 237 mLs by mouth 3 (three) times daily between meals.   folic acid  1 MG tablet Commonly known as: FOLVITE  Take 1 tablet (1 mg total) by mouth daily. Start taking on: June 13, 2024   hydrOXYzine 10 MG tablet Commonly known as: ATARAX Take 10 mg by mouth 3 (three) times daily as needed for itching (allergies).   MAGNESIUM  PO Take 1 tablet by mouth daily.   multivitamin with minerals Tabs tablet Take 1 tablet by mouth daily.   OMEGA 3 PO Take 1 tablet by mouth daily.   polyethylene glycol 17 g packet Commonly known as: MIRALAX  / GLYCOLAX  Take 17 g by mouth daily as needed for moderate constipation.   sodium chloride  flush 0.9 % Soln injection 5 mLs by Intracatheter route daily. Flush RLQ drain daily with 5 mL, flush slowly   thiamine  100 MG tablet Commonly known as: Vitamin B-1 Take 1 tablet (100 mg total) by mouth daily. Start taking on: June 13, 2024   vitamin B-12 100 MCG  tablet Commonly known as: CYANOCOBALAMIN  Take 100 mcg by mouth daily.   vitamin C  250 MG tablet Commonly known as: ASCORBIC ACID  Take 250 mg by mouth daily.   Vitamin D  (Cholecalciferol ) 25 MCG (1000 UT) Tabs Take 1,000 Units by mouth daily.               Durable Medical Equipment  (From admission, onward)           Start     Ordered   06/10/24 1314  For home use only DME Negative pressure wound device  Once       Question Answer Comment  Frequency of dressing change 3 times per week   Length of need 3 Months   Dressing type Foam   Amount of suction 125 mm/Hg   Pressure application Continuous pressure   Supplies 10 canisters and 15 dressings per month for duration of therapy      06/10/24 1313  Discharge Care Instructions  (From admission, onward)           Start     Ordered   06/12/24 0000  Discharge wound care:       Comments: Wound vac change 3 times per week - -   06/12/24 1558             Follow-up Information     Derick Leita POUR, MD Follow up in 1 week(s).   Specialty: Family Medicine Contact information: [address] [place] [address] [phone]                 No Known Allergies   The results of significant diagnostics from this hospitalization (including imaging, microbiology, ancillary and laboratory) are listed below for reference.   Consultations:   Procedures/Studies: CT GUIDED PERITONEAL/RETROPERITONEAL FLUID DRAIN BY PERC CATH Result Date: 06/07/2024 CLINICAL DATA:  Small-bowel resection, 11.7 cm right pelvic fluid collection EXAM: CT GUIDED DRAINAGE OF PELVIC ABSCESS ANESTHESIA/SEDATION: Intravenous Fentanyl  50mcg and Versed  1mg  were administered by RN during a total moderate (conscious) sedation time of 15 minutes; the patient's level of consciousness and physiological / cardiorespiratory status were monitored continuously by radiology RN under my direct supervision. PROCEDURE: The  procedure, risks, benefits, and alternatives were explained to the patient. Questions regarding the procedure were encouraged and answered. The patient understands and consents to the procedure. select axial scans of the pelvis for pain with the patient in the LPO position. The pelvic fluid collection was localized and an appropriate skin site was determined and marked. The operative field was prepped with chlorhexidinein a sterile fashion, and a sterile drape was applied covering the operative field. A sterile gown and sterile gloves were used for the procedure. Local anesthesia was provided with 1% Lidocaine . Under CT fluoroscopic guidance, 18 gauge trocar needle advanced to the collection. Blood tinged fluid could be aspirated. Amplatz guidewire advanced easily within the collection, position confirmed on CT. Tract dilated to facilitate placement of a 10 French pigtail drain catheter, formed dependently within the collection. 20 mL of bloody fluid were aspirated, sent for Gram stain and culture. Catheter secured externally with 0 Prolene suture and StatLock and placed to gravity drain bag. The patient tolerated the procedure well. RADIATION DOSE REDUCTION: This exam was performed according to the departmental dose-optimization program which includes automated exposure control, adjustment of the mA and/or kV according to patient size and/or use of iterative reconstruction technique. COMPLICATIONS: None immediate FINDINGS: Pelvic fluid collection superior to the urinary bladder was localized. 10 French pigtail drain catheter placed as above. 20 mL bloody aspirate sent for Gram stain and culture. IMPRESSION: 1. Technically successful CT-guided pelvic abscess drain catheter placement. Electronically Signed   By: JONETTA Faes M.D.   On: 06/07/2024 15:03   CT ABDOMEN PELVIS W CONTRAST Result Date: 06/06/2024 CLINICAL DATA:  Recurrent non resolving ileus after small-bowel resection. EXAM: CT ABDOMEN AND PELVIS WITH  CONTRAST TECHNIQUE: Multidetector CT imaging of the abdomen and pelvis was performed using the standard protocol following bolus administration of intravenous contrast. RADIATION DOSE REDUCTION: This exam was performed according to the departmental dose-optimization program which includes automated exposure control, adjustment of the mA and/or kV according to patient size and/or use of iterative reconstruction technique. CONTRAST:  OMNIPAQUE  IOHEXOL  300 MG/ML  SOLN COMPARISON:  Most recent CT 05/20/2024 FINDINGS: Lower chest: Hypoventilatory changes in the right greater than left lower lobe. No pleural effusion. Benign calcified granuloma in the right lower lobe. Hepatobiliary: Hypodense lesion  in the right lobe of the liver measures 19 mm, characterized on recent MRI. No change in size from that exam. Additional tiny hypodensity in the anterior liver is unchanged. Multiple gallstones in the gallbladder. Pneumobilia was also present on prior exam. No gallbladder inflammation. Pancreas: No ductal dilatation or inflammation. Spleen: Normal in size without focal abnormality. Adrenals/Urinary Tract: Normal adrenal glands. No hydronephrosis or renal inflammation. Right renal cyst. 9 mm calcification in the dependent urinary bladder. Mild posterior bladder wall thickening. Stomach/Bowel: Minimal hiatal hernia. The stomach is nondistended. Administered enteric contrast progresses through the small bowel into the colon. Few prominent and mildly dilated loops of small bowel are seen in the pelvis, 3.2 cm. Appendectomy. Contrast in the colon extends to the colon just before the colostomy. Status post low anterior resection. Vascular/Lymphatic: Aortic and branch atherosclerosis. Portal vein is patent. No enlarged lymph in the abdomen or pelvis. Reproductive: Prostate is unremarkable. Other: Pelvic fluid collection measures 11.7 x 5.3 x 4.7 cm, series 2, image 61. There is some peripheral enhancement. This abuts a loop of  small bowel in the pelvis. Postsurgical change of the anterior abdominal wall without subcutaneous collection. Trace free fluid in the right upper quadrant. No free air. Musculoskeletal: The bones are subjectively under mineralized. L4 and L5 compression fractures are unchanged. Mild L1 superior endplate compression fracture is unchanged. IMPRESSION: 1. Pelvic fluid collection measures 11.7 x 5.3 x 4.7 cm, suspicious for abscess 2. Few prominent but not abnormally dilated loops of small bowel in the pelvis. Administered enteric contrast progresses through the small bowel into the colon. 3. A 9 mm calcification in the dependent urinary bladder. Mild posterior bladder wall thickening. 4. Cholelithiasis. Aortic Atherosclerosis (ICD10-I70.0). Electronically Signed   By: Andrea Gasman M.D.   On: 06/06/2024 17:24   DG Abd Portable 1V-Small Bowel Obstruction Protocol-initial, 8 hr delay Result Date: 06/01/2024 EXAM: 1 VIEW XRAY OF THE ABDOMEN 06/01/2024 11:02:15 PM COMPARISON: None available. CLINICAL HISTORY: SMALL BOWEL OBSTRUCTION; 8 HOUR DELAY; EXAM DELAYED DUE TO PATIENT DIRTY, COLOSTOMY BAG LEAKED AND HAD TO BE CLEANED UP FIRST FINDINGS: BOWEL: Enteric tube in the mid gastric body. Contrast opacifies the ascending colon to the sigmoid, favoring a nonobstructive appearance. SOFT TISSUES: No abnormal calcifications or opaque urinary calculi. BONES: No acute osseous abnormality. IMPRESSION: 1. Contrast opacifies the ascending colon to the sigmoid, favoring a nonobstructive appearance. Electronically signed by: Pinkie Pebbles MD 06/01/2024 11:47 PM EDT RP Workstation: [id number]   DG Abd 1 View Result Date: 05/30/2024 CLINICAL DATA:  Check gastric catheter placement EXAM: ABDOMEN - 1 VIEW COMPARISON:  Film from earlier in the same day. FINDINGS: Gastric catheter has been advanced and now lies deeper into the stomach in satisfactory position. No free air is noted. IMPRESSION: Gastric catheter in satisfactory  position. Electronically Signed   By: Oneil Devonshire M.D.   On: 05/30/2024 02:52   DG Abd 1 View Result Date: 05/30/2024 CLINICAL DATA:  Check gastric catheter placement EXAM: ABDOMEN - 1 VIEW COMPARISON:  None Available. FINDINGS: Gastric catheter is noted with the tip in the stomach. Proximal side port lies in the distal esophagus. This should be advanced deeper into the stomach. IMPRESSION: Gastric catheter as described. This should be advanced deeper into the stomach. Electronically Signed   By: Oneil Devonshire M.D.   On: 05/30/2024 01:51   US  EKG SITE RITE Result Date: 05/30/2024 If Site Rite image not attached, placement could not be confirmed due to current cardiac rhythm.  DG Abd Portable  1V-Small Bowel Obstruction Protocol-initial, 8 hr delay Result Date: 05/25/2024 CLINICAL DATA:  Small-bowel obstruction. 8 hour post administration imaging EXAM: PORTABLE ABDOMEN - 1 VIEW COMPARISON:  05/24/2024 FINDINGS: Administered oral contrast opacifies several loops of nondilated small bowel, but also extends into the nondistended transverse and proximal descending colon. No free intraperitoneal gas. Laparotomy skin staples again noted. Extensive vascular calcification noted. Ostomy appliance noted within the left lower quadrant. No acute bone abnormality. IMPRESSION: 1. Nonobstructive bowel gas pattern. Electronically Signed   By: Dorethia Molt M.D.   On: 05/25/2024 21:46   DG Abd 1 View Result Date: 05/24/2024 CLINICAL DATA:  Check gastric catheter placement EXAM: ABDOMEN - 1 VIEW COMPARISON:  Film from earlier in the same day. FINDINGS: Gastric catheter has been inserted with the tip in the stomach. Proximal side port lies in the distal esophagus. This could be advanced deeper into the stomach. IMPRESSION: Gastric catheter as described. This could be advanced deeper into the stomach for optimal positioning. Electronically Signed   By: Oneil Devonshire M.D.   On: 05/24/2024 22:04   DG Abd 1 View Result Date:  05/24/2024 CLINICAL DATA:  Nasogastric tube placement. EXAM: ABDOMEN - 1 VIEW COMPARISON:  None Available. FINDINGS: Nasogastric tube is not visualized. Dilated small bowel loops are noted concerning for distal small bowel obstruction. No definite colonic dilatation is noted. IMPRESSION: Nasogastric tube not visualized. Small bowel dilatation is noted concerning for distal small bowel obstruction. Electronically Signed   By: Lynwood Landy Raddle M.D.   On: 05/24/2024 13:51   DG Abd 1 View Result Date: 05/24/2024 CLINICAL DATA:  Nasogastric tube placement. EXAM: ABDOMEN - 1 VIEW COMPARISON:  Same day. FINDINGS: Nasogastric tube is not visualized in the esophagus or upper abdomen. IMPRESSION: Nasogastric tube is not visualized in the esophagus or upper abdomen. Electronically Signed   By: Lynwood Landy Raddle M.D.   On: 05/24/2024 11:13   DG Abd 2 Views Result Date: 05/24/2024 CLINICAL DATA:  Small bowel obstruction EXAM: ABDOMEN - 2 VIEW COMPARISON:  Prior abdominal radiograph 05/21/2024 FINDINGS: Numerous loops of dilated and air-filled small bowel are present throughout the abdomen. Dilation measures up to 4.5 cm. No significant colonic distension. Findings are consistent with recurrent small bowel obstruction. Extensive chronic bronchitic changes again noted. No evidence of large free air. IMPRESSION: Recurrent high-grade small-bowel obstruction. Electronically Signed   By: Wilkie Lent M.D.   On: 05/24/2024 08:13   MR ABDOMEN W WO CONTRAST Result Date: 05/23/2024 CLINICAL DATA:  History of rectal cancer with lung metastases. New right hepatic lobe lesion. EXAM: MRI ABDOMEN WITHOUT AND WITH CONTRAST TECHNIQUE: Multiplanar multisequence MR imaging of the abdomen was performed both before and after the administration of intravenous contrast. CONTRAST:  8mL GADAVIST  GADOBUTROL  1 MMOL/ML IV SOLN COMPARISON:  CT scan 05/20/2024 FINDINGS: Lower chest: Minimal atelectasis at the right base. Hepatobiliary: Ill-defined  T1 hypointense, T2 hyperintense lesion in the right hepatic lobe shows peripheral rim enhancement measuring approximately 1.9 x 1.9 cm. 4 mm T2 hyperintensity is identified in segment IV and a 2-3 mm T2 hyperintensity is identified in segment II. Neither lesion shows restricted diffusion or evidence of enhancement after IV contrast administration. Both were present on CT scan of 11/13/2022 and are compatible with tiny hepatic cysts. Multiple gallstones identified measuring up to at least 16 mm in size. Gas is identified in the lumen of the gallbladder, similar to prior CT in suggesting prior sphincterotomy. No intrahepatic or extrahepatic biliary dilation. Pancreas: No focal mass  lesion. No dilatation of the main duct. No intraparenchymal cyst. No peripancreatic edema. Spleen:  No splenomegaly. No suspicious focal mass lesion. Adrenals/Urinary Tract: No adrenal nodule or mass. Simple cyst noted right kidney. Left kidney unremarkable. Stomach/Bowel: Stomach is unremarkable. No gastric wall thickening. No evidence of outlet obstruction. Duodenum is normally positioned as is the ligament of Treitz. Mild gaseous small bowel dilatation noted within the visualized upper abdomen Vascular/Lymphatic: Atherosclerotic disease noted in the abdominal aorta without aneurysm. There is no gastrohepatic or hepatoduodenal ligament lymphadenopathy. No retroperitoneal or mesenteric lymphadenopathy. Other:  No intraperitoneal free fluid. Musculoskeletal: No focal suspicious marrow enhancement within the visualized bony anatomy. IMPRESSION: 1. 1.9 x 1.9 cm ill-defined T1 hypointense, T2 hyperintense lesion in the right hepatic lobe shows peripheral rim enhancement. Given findings in the chest, imaging features are felt to be most compatible with metastatic disease. Hepatic abscess is not entirely excluded. 2. Cholelithiasis. Gas in the lumen of the gallbladder, similar to prior CT in suggesting prior sphincterotomy. In the absence of  prior sphincterotomy, infection would be a concern. 3. Mild gaseous small bowel dilatation within the visualized upper abdomen consistent with small-bowel obstruction documented on prior imaging. 4.  Aortic Atherosclerois (ICD10-170.0) Electronically Signed   By: Camellia Candle M.D.   On: 05/23/2024 05:21   CT CHEST W CONTRAST Result Date: 05/22/2024 CLINICAL DATA:  Pulmonary nodule history of rectal cancer * Tracking Code: BO * EXAM: CT CHEST WITH CONTRAST TECHNIQUE: Multidetector CT imaging of the chest was performed during intravenous contrast administration. RADIATION DOSE REDUCTION: This exam was performed according to the departmental dose-optimization program which includes automated exposure control, adjustment of the mA and/or kV according to patient size and/or use of iterative reconstruction technique. CONTRAST:  75mL OMNIPAQUE  IOHEXOL  300 MG/ML  SOLN COMPARISON:  CT abdomen pelvis, 05/20/2024, CT chest, 11/10/2022 FINDINGS: Cardiovascular: Aortic atherosclerosis. Normal heart size. Three-vessel coronary artery calcifications. No pericardial effusion. Mediastinum/Nodes: No enlarged mediastinal, hilar, or axillary lymph nodes. Thyroid gland, trachea, and esophagus demonstrate no significant findings. Lungs/Pleura: Moderate centrilobular and paraseptal emphysema. Spiculated, cavitary nodule in the anterior right upper lobe measuring 2.3 x 2.1 cm (series 4, image 45). Spiculated nodule in the right pulmonary apex measuring 2.1 x 1.7 cm, increased in size compared to prior examination dated 11/02/2022, at which time it measured 1.4 x 1.2 cm (series 4, image 26). Dependent bibasilar atelectasis or consolidation. No pleural effusion or pneumothorax. Upper Abdomen: No acute abnormality. Coarse, nodular cirrhotic morphology of the liver. Hypodense liver lesion in the hepatic dome as seen by prior CT of the abdomen pelvis (series 2, image 124). Hepatic steatosis. Musculoskeletal: No chest wall abnormality. No  acute osseous findings. IMPRESSION: 1. Spiculated, cavitary nodule in the anterior right upper lobe measuring 2.3 x 2.1 cm, which may reflect primary lung malignancy or metastasis. 2. Spiculated nodule in the right pulmonary apex measuring 2.1 x 1.7 cm, increased in size compared to prior examination dated 11/02/2022, at which time it measured 1.4 x 1.2 cm. This is most consistent with a slowly enlarging primary lung malignancy although metastasis would remain a differential consideration in the setting of known rectal malignancy. 3. Emphysema. 4. Coronary artery disease. 5. Cirrhosis and hepatic steatosis. Indeterminate liver lesion as noted by prior CT of the abdomen and pelvis, highly suspicious for metastasis. Aortic Atherosclerosis (ICD10-I70.0) and Emphysema (ICD10-J43.9). Electronically Signed   By: Marolyn JONETTA Jaksch M.D.   On: 05/22/2024 21:45   DG Abd Portable 1V-Small Bowel Obstruction Protocol-initial, 8 hr delay Result Date:  05/21/2024 CLINICAL DATA:  SBO- 8 hr picture EXAM: PORTABLE ABDOMEN - 1 VIEW COMPARISON:  CT abdomen pelvis 05/20/2024, x-ray abdomen 05/21/2024 FINDINGS: Enteric tube with tip and side port overlying the expected region the gastric lumen. PO contrast reaches the large bowel. PO contrast again noted within dilated small bowel. Gaseous dilatation of several loops of small bowel. No radio-opaque calculi or other significant radiographic abnormality are seen. IMPRESSION: Findings suggestive of partial small bowel obstruction. Electronically Signed   By: Morgane  Naveau M.D.   On: 05/21/2024 20:01   DG Abd Portable 1V Result Date: 05/21/2024 CLINICAL DATA:  NG tube placement EXAM: PORTABLE ABDOMEN - 1 VIEW COMPARISON:  CT abdomen and pelvis 05/20/2024 FINDINGS: Limited field of view for tube placement verification purposes. An enteric tube is present with tip projecting over the left upper quadrant consistent with location in the body of the stomach. Lung bases are clear. Limited  visualization of bowel but gaseous distention is suggested. Degenerative changes in the spine. IMPRESSION: Enteric tube tip projects over the left upper quadrant consistent with location in the body of stomach. Electronically Signed   By: Elsie Gravely M.D.   On: 05/21/2024 00:19   CT ABDOMEN PELVIS W CONTRAST Result Date: 05/20/2024 CLINICAL DATA:  Sharp abdominal pain nausea vomiting history of colorectal cancer coffee-ground emesis, history of rectal cancer EXAM: CT ABDOMEN AND PELVIS WITH CONTRAST TECHNIQUE: Multidetector CT imaging of the abdomen and pelvis was performed using the standard protocol following bolus administration of intravenous contrast. RADIATION DOSE REDUCTION: This exam was performed according to the departmental dose-optimization program which includes automated exposure control, adjustment of the mA and/or kV according to patient size and/or use of iterative reconstruction technique. CONTRAST:  80mL OMNIPAQUE  IOHEXOL  300 MG/ML  SOLN COMPARISON:  CT 11/13/2022, 11/08/2022, 02/19/2021, chest CT 11/10/2022 FINDINGS: Lower chest: Lung bases demonstrate mild subpleural reticulation and dependent atelectasis. 7 mm subpleural focus of nodularity or nodule left lung base, series 4, image 4. Coronary vascular calcification Hepatobiliary: Possible subtle surface nodularity of liver. Subcentimeter hypodensities too small to further characterize. Interim development of indeterminate hypodense lesion within the right hepatic lobe, 14 mm on series 2, image 12. Gallstones. Small volume of air within the gallbladder and common bile duct, correlate for interval history of sphincterotomy Pancreas: Unremarkable. No pancreatic ductal dilatation or surrounding inflammatory changes. Spleen: Normal in size without focal abnormality. Adrenals/Urinary Tract: Stable adrenal glands. No hydronephrosis. Renal cysts for which no imaging follow-up is recommended. Slightly thick-walled urinary bladder with mild  mucosal enhancement. 10 mm stone within the posterior bladder. Stomach/Bowel: Moderate fluid distension of the stomach. Multiple dilated fluid-filled loops of small bowel measuring up to 3.8 cm consistent with a bowel obstruction. Apparent 2 transition points within close vicinity of each other within the anterior pelvis, series 2, image 53 and series 2, image 60. Some mesenteric congestion in the pelvis. No acute bowel wall thickening. Negative appendix. Status post low anterior resection with left abdominal colostomy. Vascular/Lymphatic: Advanced aortic atherosclerosis. No aneurysm. No suspicious lymph nodes. Reproductive: Negative prostate Other: Negative for free air. Similar presacral soft tissue thickening. No significant ascites. Musculoskeletal: Chronic compression fractures at L4 and L5 and superior endplate of L1 IMPRESSION: 1. Findings consistent with small-bowel obstruction with 2 transition points within close vicinity of each other within the anterior pelvis, possibly due to adhesions but given appearance, closed loop physiology cannot be excluded. There is some mesenteric vascular congestion within the pelvis. 2. Status post low anterior resection with left abdominal  colostomy. Similar presacral soft tissue thickening. 3. Interim development of 14 mm indeterminate hypodense lesion within the right hepatic lobe. Given history of rectal cancer, recommend further evaluation with nonemergent MRI of the abdomen with and without contrast. 4. Gallstones. Small volume of air within the gallbladder and common bile duct, correlate for interval history of sphincterotomy. 5. 10 mm stone within the posterior bladder. Slightly thick-walled urinary bladder with mild mucosal enhancement, correlate with urinalysis to exclude cystitis. 6. 7 mm subpleural focus of nodularity or nodule at the left lung base. Short-term CT chest follow-up recommended to exclude developing pulmonary nodule. 7. Aortic atherosclerosis.  Aortic Atherosclerosis (ICD10-I70.0). Electronically Signed   By: Luke Bun M.D.   On: 05/20/2024 20:16   DG Chest Portable 1 View Result Date: 05/20/2024 CLINICAL DATA:  Shortness of breath with questionable hypoxia and cough, evaluate for pneumonia in the setting of possible sepsis EXAM: PORTABLE CHEST 1 VIEW COMPARISON:  Chest x-ray 11/13/2022 FINDINGS: The heart and mediastinal contours are unchanged. Atherosclerotic plaque. No focal consolidation. No pulmonary edema. No pleural effusion. No pneumothorax. No acute osseous abnormality. IMPRESSION: 1. No active disease. 2.  Aortic Atherosclerosis (ICD10-I70.0). Electronically Signed   By: Morgane  Naveau M.D.   On: 05/20/2024 17:47      Labs: BNP (last 3 results) Recent Labs    05/20/24 1715  BNP 161.2*   Basic Metabolic Panel: Recent Labs  Lab 06/06/24 0356 06/07/24 0349 06/08/24 0325 06/09/24 0559 06/10/24 0346 06/11/24 0300 06/12/24 0400  NA 134* 135  --   --  131*  --   --   K 4.5 4.5  --   --  4.3  --   --   CL 104 106  --   --  103  --   --   CO2 24 24  --   --  21*  --   --   GLUCOSE 192* 185*  --   --  100*  --   --   BUN 26* 25*  --   --  23  --   --   CREATININE 0.70 0.70  --   --  0.85  --   --   CALCIUM 8.3* 8.5*  --   --  8.4*  --   --   MG 2.1 2.2 2.2 2.2 2.1 2.3 2.4  PHOS 3.1  --   --   --  3.8  --   --    Liver Function Tests: Recent Labs  Lab 06/06/24 0356 06/07/24 0349 06/10/24 0346  AST 84* 85* 85*  ALT 59* 63* 74*  ALKPHOS 158* 182* 258*  BILITOT 0.7 0.6 1.1  PROT 5.5* 6.0* 6.5  ALBUMIN 1.9* 2.1* 2.1*   No results for input(s): LIPASE, AMYLASE in the last 168 hours. No results for input(s): AMMONIA in the last 168 hours. CBC: Recent Labs  Lab 06/08/24 0325  WBC 12.4*  HGB 8.5*  HCT 25.4*  MCV 102.0*  PLT 227   Cardiac Enzymes: No results for input(s): CKTOTAL, CKMB, CKMBINDEX, TROPONINI in the last 168 hours. BNP: Invalid input(s): POCBNP CBG: Recent Labs  Lab  06/11/24 1116 06/11/24 1640 06/11/24 2059 06/12/24 0832 06/12/24 1120  GLUCAP 85 100* 105* 97 103*   D-Dimer No results for input(s): DDIMER in the last 72 hours. Hgb A1c No results for input(s): HGBA1C in the last 72 hours. Lipid Profile Recent Labs    06/10/24 0346  TRIG 91   Thyroid function studies No results for input(s):  TSH, T4TOTAL, T3FREE, THYROIDAB in the last 72 hours.  Invalid input(s): FREET3 Anemia work up No results for input(s): VITAMINB12, FOLATE, FERRITIN, TIBC, IRON, RETICCTPCT in the last 72 hours. Urinalysis    Component Value Date/Time   COLORURINE YELLOW (A) 05/20/2024 1720   APPEARANCEUR TURBID (A) 05/20/2024 1720   APPEARANCEUR Cloudy 04/28/2014 0900   LABSPEC 1.027 05/20/2024 1720   LABSPEC 1.023 04/28/2014 0900   PHURINE 7.0 05/20/2024 1720   GLUCOSEU NEGATIVE 05/20/2024 1720   GLUCOSEU Negative 04/28/2014 0900   HGBUR NEGATIVE 05/20/2024 1720   BILIRUBINUR SMALL (A) 05/20/2024 1720   BILIRUBINUR Negative 04/28/2014 0900   KETONESUR 5 (A) 05/20/2024 1720   PROTEINUR >=300 (A) 05/20/2024 1720   NITRITE NEGATIVE 05/20/2024 1720   LEUKOCYTESUR MODERATE (A) 05/20/2024 1720   LEUKOCYTESUR Negative 04/28/2014 0900   Sepsis Labs Recent Labs  Lab 06/08/24 0325  WBC 12.4*   Microbiology Recent Results (from the past 240 hours)  Aerobic/Anaerobic Culture w Gram Stain (surgical/deep wound)     Status: None   Collection Time: 06/07/24 11:03 AM   Specimen: Abscess  Result Value Ref Range Status   Specimen Description   Final    ABSCESS Performed at [facility], [address], [place], [place] [address]    Special Requests   Final     CT PELVIC DRAIN Performed at [facility], [address] [place], [place] [address]    Gram Stain   Final    ABUNDANT WBC PRESENT, PREDOMINANTLY PMN FEW GRAM POSITIVE COCCI IN CHAINS RARE GRAM POSITIVE RODS    Culture   Final    ABUNDANT ENTEROCOCCUS  FAECIUM ABUNDANT BACTEROIDES UNIFORMIS BETA LACTAMASE POSITIVE Performed at [facility], [address] [place], [place] [address]    Report Status 06/12/2024 FINAL  Final   Organism ID, Bacteria ENTEROCOCCUS FAECIUM  Final      Susceptibility   Enterococcus faecium - MIC*    AMPICILLIN  >=32 RESISTANT Resistant     VANCOMYCIN  >=32 RESISTANT Resistant     GENTAMICIN SYNERGY SENSITIVE Sensitive     * ABUNDANT ENTEROCOCCUS FAECIUM     Total time spend on discharging this patient, including the last patient exam, discussing the hospital stay, instructions for ongoing care as it relates to all pertinent caregivers, as well as preparing the medical discharge records, prescriptions, and/or referrals as applicable, is 40 minutes.    Ellouise Haber, MD  [facility] 06/12/2024, 3:58 PM

## 2024-06-12 NOTE — Progress Notes (Signed)
 Patient transported to home via [facility]. Patient alert and oriented, breathing regular unlabored, no distress.

## 2024-06-12 NOTE — Progress Notes (Signed)
 [facility]- General Surgery  SURGICAL PROGRESS NOTE  Hospital Day(s): 23.   Post op day(s): 14 Days Post-Op.   Interval History:  Patient seen and examined. Doing well. Has stool output and gas in colostomy bag. No other complaints or concerns. Due for wound vac change today.    Vital signs in last 24 hours: [min-max] current  Temp:  [98.2 F (36.8 C)-98.6 F (37 C)] 98.6 F (37 C) (07/30 0832) Pulse Rate:  [76-87] 82 (07/30 0832) Resp:  [16-18] 16 (07/30 0832) BP: (102-115)/(65-70) 102/65 (07/30 0832) SpO2:  [95 %-97 %] 95 % (07/30 0832) Weight:  [84 kg] 84 kg (07/30 0446)     Height: 5' 7 (170.2 cm) Weight: 84 kg BMI (Calculated): 29   Intake/Output last 2 shifts:  07/29 0701 - 07/30 0700 In: 510 [P.O.:500] Out: 665 [Urine:600; Drains:25; Stool:40]   Physical Exam:  Constitutional: alert, cooperative and no distress  Respiratory: breathing non-labored at rest  Cardiovascular: regular rate and sinus rhythm  Gastrointestinal: soft, non-tender, and non-distended, changed wound vac today. No signs of infection. Healing well. Wound getting smaller.   Labs:     Latest Ref Rng & Units 06/08/2024    3:25 AM 06/05/2024    5:05 AM 06/04/2024    5:11 AM  CBC  WBC 4.0 - 10.5 K/uL 12.4  12.0  11.2   Hemoglobin 13.0 - 17.0 g/dL 8.5  8.8  9.7   Hematocrit 39.0 - 52.0 % 25.4  25.9  29.5   Platelets 150 - 400 K/uL 227  205  182       Latest Ref Rng & Units 06/10/2024    3:46 AM 06/07/2024    3:49 AM 06/06/2024    3:56 AM  CMP  Glucose 70 - 99 mg/dL 899  814  807   BUN 8 - 23 mg/dL 23  25  26    Creatinine 0.61 - 1.24 mg/dL 9.14  9.29  9.29   Sodium 135 - 145 mmol/L 131  135  134   Potassium 3.5 - 5.1 mmol/L 4.3  4.5  4.5   Chloride 98 - 111 mmol/L 103  106  104   CO2 22 - 32 mmol/L 21  24  24    Calcium 8.9 - 10.3 mg/dL 8.4  8.5  8.3   Total Protein 6.5 - 8.1 g/dL 6.5  6.0  5.5   Total Bilirubin 0.0 - 1.2 mg/dL 1.1  0.6  0.7   Alkaline Phos 38 - 126 U/L 258  182  158    AST 15 - 41 U/L 85  85  84   ALT 0 - 44 U/L 74  63  59     Imaging studies: No new pertinent imaging studies  Procedure: Changed previous wound vac. Cleansed with Vashe wound solution. A negative pressure dressing (DME) was placed in steps, covering a 20 cm x 2 cm (40 cm area). Wound healing as expected.  I personally changed wound vac with Dr. Polly supervision.    Assessment/Plan:  78 y.o. male with small bowel obstruction 19 Days Post-op s/p exploratory laparotomy with small bowel resection and appendectomy,  14 Days Post-Op s/p revision and closure of abdominal wall due to dehiscence and evisceration of omental fat complicated by pertinent comorbidities including HFpEF, alcohol use, metastatic rectal cancer with Foley and colostomy, HTN, AKI, and UTI.    Patient is expected to go home today. No contraindication for discharge from surgical standpoint. Plan to continue wound vac changes at  home 2-3x/week for at least month. Discussed setting up an outpatient appointment in clinic to follow-up in 2-3 weeks. Wife is not sure how to bring him to the appointment. States he does not go to his regular doctor visits.   -- Gilmer Cea PA-C

## 2024-06-12 NOTE — Plan of Care (Signed)
   Problem: Education: Goal: Knowledge of General Education information will improve Description: Including pain rating scale, medication(s)/side effects and non-pharmacologic comfort measures Outcome: Progressing   Problem: Clinical Measurements: Goal: Ability to maintain clinical measurements within normal limits will improve Outcome: Progressing

## 2024-06-22 ENCOUNTER — Other Ambulatory Visit: Payer: Self-pay | Admitting: Surgery

## 2024-06-22 MED ORDER — ONDANSETRON HCL 4 MG PO TABS: 4.0000 mg | ORAL_TABLET | Freq: Three times a day (TID) | ORAL | 0 refills | Status: DC | PRN

## 2024-06-22 NOTE — Progress Notes (Signed)
 Patient called today, with his wife on speaker phone.  Reportedly has no antiemetics amongst his medications.  Currently taking Norco for pain and feeling an adequate pain relief.  They report no acute or new changes, however his appetite continues to be negligible.  He reports he is tolerating liquids readily, and though taking Ensure and reporting nausea denies any vomiting.  He reports flatus, but denies bowel movements.  He has Atarax available for as needed itching. Denies fevers and chills. He does not want to be readmitted to the hospital reevaluated, he reports his urine is dark in his Foley bag.  I encouraged him to consume increased volume of fluids and pursue clarity/lightening of the color of his urine.  And lieu of asking him to report to the ED, he agreed to trial some Zofran . Will see how effective this is, but cautioned them that they may need readmission if he progressively becomes more dehydrated.  Or develops vomiting like he had when he had his intestinal obstruction.

## 2024-08-05 ENCOUNTER — Encounter: Payer: Self-pay | Admitting: General Surgery

## 2024-08-14 DEATH — deceased
